# Patient Record
Sex: Female | Born: 1945 | Race: Black or African American | Hispanic: No | Smoking: Never smoker
Health system: Southern US, Community
[De-identification: ages and names within clinical notes are randomized; demographics above are authoritative.]

## PROBLEM LIST (undated history)

## (undated) DIAGNOSIS — E785 Hyperlipidemia, unspecified: Secondary | ICD-10-CM

## (undated) DIAGNOSIS — K5900 Constipation, unspecified: Secondary | ICD-10-CM

## (undated) DIAGNOSIS — E119 Type 2 diabetes mellitus without complications: Secondary | ICD-10-CM

## (undated) DIAGNOSIS — D649 Anemia, unspecified: Secondary | ICD-10-CM

## (undated) DIAGNOSIS — I10 Essential (primary) hypertension: Secondary | ICD-10-CM

## (undated) DIAGNOSIS — G4700 Insomnia, unspecified: Secondary | ICD-10-CM

## (undated) DIAGNOSIS — J45909 Unspecified asthma, uncomplicated: Secondary | ICD-10-CM

## (undated) DIAGNOSIS — Z91018 Allergy to other foods: Secondary | ICD-10-CM

## (undated) DIAGNOSIS — M1990 Unspecified osteoarthritis, unspecified site: Secondary | ICD-10-CM

## (undated) DIAGNOSIS — R0600 Dyspnea, unspecified: Secondary | ICD-10-CM

## (undated) DIAGNOSIS — R600 Localized edema: Secondary | ICD-10-CM

## (undated) DIAGNOSIS — M25569 Pain in unspecified knee: Secondary | ICD-10-CM

## (undated) DIAGNOSIS — J349 Unspecified disorder of nose and nasal sinuses: Secondary | ICD-10-CM

## (undated) DIAGNOSIS — M2550 Pain in unspecified joint: Secondary | ICD-10-CM

## (undated) DIAGNOSIS — E739 Lactose intolerance, unspecified: Secondary | ICD-10-CM

## (undated) HISTORY — DX: Unspecified osteoarthritis, unspecified site: M19.90

## (undated) HISTORY — DX: Constipation, unspecified: K59.00

## (undated) HISTORY — DX: Unspecified disorder of nose and nasal sinuses: J34.9

## (undated) HISTORY — DX: Essential (primary) hypertension: I10

## (undated) HISTORY — DX: Pain in unspecified joint: M25.50

## (undated) HISTORY — DX: Anemia, unspecified: D64.9

## (undated) HISTORY — PX: TUBAL LIGATION: SHX77

## (undated) HISTORY — PX: EYE SURGERY: SHX253

## (undated) HISTORY — DX: Pain in unspecified knee: M25.569

## (undated) HISTORY — DX: Insomnia, unspecified: G47.00

## (undated) HISTORY — DX: Dyspnea, unspecified: R06.00

## (undated) HISTORY — DX: Localized edema: R60.0

## (undated) HISTORY — DX: Unspecified asthma, uncomplicated: J45.909

## (undated) HISTORY — DX: Allergy to other foods: Z91.018

## (undated) HISTORY — DX: Lactose intolerance, unspecified: E73.9

## (undated) HISTORY — DX: Hyperlipidemia, unspecified: E78.5

---

## 2003-06-24 ENCOUNTER — Emergency Department (HOSPITAL_COMMUNITY): Admission: EM | Admit: 2003-06-24 | Discharge: 2003-06-24 | Payer: Self-pay | Admitting: Emergency Medicine

## 2003-09-06 ENCOUNTER — Emergency Department (HOSPITAL_COMMUNITY): Admission: EM | Admit: 2003-09-06 | Discharge: 2003-09-06 | Payer: Self-pay | Admitting: Emergency Medicine

## 2004-09-05 ENCOUNTER — Emergency Department (HOSPITAL_COMMUNITY): Admission: EM | Admit: 2004-09-05 | Discharge: 2004-09-05 | Payer: Self-pay | Admitting: Emergency Medicine

## 2006-12-01 ENCOUNTER — Emergency Department (HOSPITAL_COMMUNITY): Admission: EM | Admit: 2006-12-01 | Discharge: 2006-12-01 | Payer: Self-pay | Admitting: Emergency Medicine

## 2006-12-14 ENCOUNTER — Ambulatory Visit: Payer: Self-pay | Admitting: Family Medicine

## 2006-12-15 ENCOUNTER — Ambulatory Visit: Payer: Self-pay | Admitting: *Deleted

## 2006-12-26 ENCOUNTER — Ambulatory Visit (HOSPITAL_COMMUNITY): Admission: RE | Admit: 2006-12-26 | Discharge: 2006-12-26 | Payer: Self-pay | Admitting: Family Medicine

## 2007-01-02 ENCOUNTER — Ambulatory Visit: Payer: Self-pay | Admitting: Family Medicine

## 2007-01-09 ENCOUNTER — Emergency Department (HOSPITAL_COMMUNITY): Admission: EM | Admit: 2007-01-09 | Discharge: 2007-01-09 | Payer: Self-pay | Admitting: Emergency Medicine

## 2007-01-25 ENCOUNTER — Ambulatory Visit: Payer: Self-pay | Admitting: Family Medicine

## 2007-04-13 ENCOUNTER — Ambulatory Visit: Payer: Self-pay | Admitting: Family Medicine

## 2007-05-15 ENCOUNTER — Emergency Department (HOSPITAL_COMMUNITY): Admission: EM | Admit: 2007-05-15 | Discharge: 2007-05-15 | Payer: Self-pay | Admitting: Emergency Medicine

## 2007-05-26 ENCOUNTER — Ambulatory Visit: Payer: Self-pay | Admitting: Family Medicine

## 2007-09-25 ENCOUNTER — Ambulatory Visit: Payer: Self-pay | Admitting: Family Medicine

## 2007-09-25 LAB — CONVERTED CEMR LAB
CO2: 25 meq/L (ref 19–32)
Calcium: 9.1 mg/dL (ref 8.4–10.5)
Free T4: 1.16 ng/dL (ref 0.89–1.80)
Glucose, Bld: 112 mg/dL — ABNORMAL HIGH (ref 70–99)
Sodium: 140 meq/L (ref 135–145)

## 2007-10-18 ENCOUNTER — Ambulatory Visit: Payer: Self-pay | Admitting: Family Medicine

## 2007-10-18 LAB — CONVERTED CEMR LAB
Calcium: 9.5 mg/dL (ref 8.4–10.5)
Free T4: 1.05 ng/dL (ref 0.89–1.80)
Glucose, Bld: 131 mg/dL — ABNORMAL HIGH (ref 70–99)
TSH: 1.333 microintl units/mL (ref 0.350–5.50)

## 2007-12-25 ENCOUNTER — Ambulatory Visit: Payer: Self-pay | Admitting: Internal Medicine

## 2008-02-24 ENCOUNTER — Emergency Department (HOSPITAL_COMMUNITY): Admission: EM | Admit: 2008-02-24 | Discharge: 2008-02-24 | Payer: Self-pay | Admitting: Emergency Medicine

## 2008-06-21 HISTORY — PX: BREAST BIOPSY: SHX20

## 2008-06-21 HISTORY — PX: BREAST LUMPECTOMY: SHX2

## 2008-12-20 ENCOUNTER — Ambulatory Visit: Payer: Self-pay | Admitting: Family Medicine

## 2008-12-26 ENCOUNTER — Ambulatory Visit (HOSPITAL_COMMUNITY): Admission: RE | Admit: 2008-12-26 | Discharge: 2008-12-26 | Payer: Self-pay | Admitting: Family Medicine

## 2008-12-31 ENCOUNTER — Encounter: Admission: RE | Admit: 2008-12-31 | Discharge: 2008-12-31 | Payer: Self-pay | Admitting: Family Medicine

## 2009-01-24 ENCOUNTER — Encounter: Admission: RE | Admit: 2009-01-24 | Discharge: 2009-01-24 | Payer: Self-pay | Admitting: Infectious Diseases

## 2009-03-12 ENCOUNTER — Ambulatory Visit: Payer: Self-pay | Admitting: Family Medicine

## 2009-03-28 ENCOUNTER — Ambulatory Visit: Payer: Self-pay | Admitting: Family Medicine

## 2009-04-15 ENCOUNTER — Encounter: Admission: RE | Admit: 2009-04-15 | Discharge: 2009-04-15 | Payer: Self-pay | Admitting: General Surgery

## 2009-04-15 ENCOUNTER — Ambulatory Visit (HOSPITAL_COMMUNITY): Admission: RE | Admit: 2009-04-15 | Discharge: 2009-04-15 | Payer: Self-pay | Admitting: General Surgery

## 2009-04-15 ENCOUNTER — Encounter (INDEPENDENT_AMBULATORY_CARE_PROVIDER_SITE_OTHER): Payer: Self-pay | Admitting: General Surgery

## 2010-07-13 ENCOUNTER — Encounter: Payer: Self-pay | Admitting: Family Medicine

## 2010-08-16 ENCOUNTER — Emergency Department (HOSPITAL_COMMUNITY)
Admission: EM | Admit: 2010-08-16 | Discharge: 2010-08-16 | Disposition: A | Discharge status: Home / Self Care | Payer: Self-pay | Attending: Emergency Medicine | Admitting: Emergency Medicine

## 2010-08-16 ENCOUNTER — Emergency Department (HOSPITAL_COMMUNITY): Payer: Self-pay

## 2010-09-24 LAB — DIFFERENTIAL
Basophils Absolute: 0.1 10*3/uL (ref 0.0–0.1)
Basophils Relative: 1 % (ref 0–1)
Eosinophils Absolute: 0.2 10*3/uL (ref 0.0–0.7)
Eosinophils Relative: 2 % (ref 0–5)
Neutrophils Relative %: 67 % (ref 43–77)

## 2010-09-24 LAB — CBC
Hemoglobin: 10.9 g/dL — ABNORMAL LOW (ref 12.0–15.0)
MCHC: 32.7 g/dL (ref 30.0–36.0)
MCV: 80.2 fL (ref 78.0–100.0)
RBC: 4.16 MIL/uL (ref 3.87–5.11)

## 2010-09-24 LAB — BASIC METABOLIC PANEL
CO2: 27 mEq/L (ref 19–32)
Calcium: 9.1 mg/dL (ref 8.4–10.5)
Chloride: 99 mEq/L (ref 96–112)
Potassium: 3.9 mEq/L (ref 3.5–5.1)

## 2011-03-24 LAB — POCT CARDIAC MARKERS: Troponin i, poc: <0.05

## 2011-04-08 LAB — DIFFERENTIAL
Basophils Relative: 0
Monocytes Absolute: 0.7
Monocytes Relative: 6
Neutro Abs: 7.7

## 2011-04-08 LAB — URINALYSIS, ROUTINE W REFLEX MICROSCOPIC
Nitrite: NEGATIVE
Specific Gravity, Urine: 1.022
pH: 6

## 2011-04-08 LAB — CBC
HCT: 32.8 — ABNORMAL LOW
Platelets: 316
RDW: 18.9 — ABNORMAL HIGH

## 2011-04-08 LAB — URINE MICROSCOPIC-ADD ON

## 2011-04-08 LAB — COMPREHENSIVE METABOLIC PANEL
Albumin: 3.8
Alkaline Phosphatase: 78
BUN: 11
Potassium: 4
Sodium: 135
Total Protein: 8.5 — ABNORMAL HIGH

## 2011-04-08 LAB — B-NATRIURETIC PEPTIDE (CONVERTED LAB): Pro B Natriuretic peptide (BNP): <30

## 2012-01-10 ENCOUNTER — Telehealth: Payer: Self-pay

## 2012-01-10 NOTE — Telephone Encounter (Signed)
The patient called to request the copy of her last physical for [facility] mailed to [employer] [facility] [address] [place], [place] [address].  Please clarify request with patient at [phone].

## 2012-01-11 NOTE — Telephone Encounter (Signed)
Last physical mailed as requested. Tried to call patient but phone number wasn't working.

## 2013-06-22 ENCOUNTER — Encounter: Payer: Self-pay | Admitting: Family Medicine

## 2013-06-22 ENCOUNTER — Ambulatory Visit (INDEPENDENT_AMBULATORY_CARE_PROVIDER_SITE_OTHER): Payer: Medicare Other | Admitting: Family Medicine

## 2013-06-22 VITALS — BP 158/80 | HR 81 | Temp 98.5°F | Resp 18 | Ht 61.5 in | Wt 209.0 lb

## 2013-06-22 DIAGNOSIS — D649 Anemia, unspecified: Secondary | ICD-10-CM

## 2013-06-22 DIAGNOSIS — I10 Essential (primary) hypertension: Secondary | ICD-10-CM

## 2013-06-22 LAB — POCT CBC
Granulocyte percent: 69 %G (ref 37–80)
HCT, POC: 37.7 % (ref 37.7–47.9)
HEMOGLOBIN: 11.3 g/dL — AB (ref 12.2–16.2)
LYMPH, POC: 3.1 (ref 0.6–3.4)
MCH: 24.4 pg — AB (ref 27–31.2)
MCHC: 30 g/dL — AB (ref 31.8–35.4)
MCV: 81.5 fL (ref 80–97)
MID (cbc): 0.6 (ref 0–0.9)
MPV: 9.6 fL (ref 0–99.8)
POC Granulocyte: 8.3 — AB (ref 2–6.9)
POC LYMPH PERCENT: 25.9 %L (ref 10–50)
POC MID %: 5.1 %M (ref 0–12)
Platelet Count, POC: 356 10*3/uL (ref 142–424)
RBC: 4.63 M/uL (ref 4.04–5.48)
RDW, POC: 19.7 %
WBC: 12 10*3/uL — AB (ref 4.6–10.2)

## 2013-06-22 LAB — BASIC METABOLIC PANEL
BUN: 12 mg/dL (ref 6–23)
CALCIUM: 9.5 mg/dL (ref 8.4–10.5)
CHLORIDE: 100 meq/L (ref 96–112)
CO2: 29 meq/L (ref 19–32)
CREATININE: 0.88 mg/dL (ref 0.50–1.10)
GLUCOSE: 111 mg/dL — AB (ref 70–99)
Potassium: 4 mEq/L (ref 3.5–5.3)
Sodium: 140 mEq/L (ref 135–145)

## 2013-06-22 MED ORDER — LISINOPRIL-HYDROCHLOROTHIAZIDE 10-12.5 MG PO TABS: 1.0000 | ORAL_TABLET | Freq: Every day | ORAL | Status: DC

## 2013-06-22 NOTE — Progress Notes (Addendum)
Subjective:    Patient ID: Penny Gibson, female    DOB: [birth year], 68 y.o.   MRN: [id number] This chart was scribed for Merri Ray, MD by Vernell Barrier, Medical Scribe. This patient's care was started at 3:10 PM.  HPI HPI Comments: Penny Gibson is a 68 y.o. female here for grandparent physical but also noted hx of anemia, dyspnea on exertion, hx of HTN, and has been out of medicine since yesterday. No PCP. No refills left. Prior lived in [place] where she was diagnosed with anemia and put on iron supplement 1x/day. She had dyspnea on exertion only, not at rest, and only when walking long distances. Denies chest pain, lightheadedness or other new sx recently.   There are no active problems to display for this patient.  Past Medical History  Diagnosis Date  . Anemia   . Hypertension   . Arthritis    Past Surgical History  Procedure Laterality Date  . Breast lumpectomy    . Tubal ligation     Allergies  Allergen Reactions  . Penicillins    Prior to Admission medications   Medication Sig Start Date End Date Taking? Authorizing Provider  lisinopril-hydrochlorothiazide (PRINZIDE,ZESTORETIC) 10-12.5 MG per tablet Take 1 tablet by mouth daily.   Yes Historical Provider, MD   History   Social History  . Marital Status: Divorced    Spouse Name: N/A    Number of Children: N/A  . Years of Education: N/A   Occupational History  . Not on file.   Social History Main Topics  . Smoking status: Not on file  . Smokeless tobacco: Not on file  . Alcohol Use: Not on file  . Drug Use: Not on file  . Sexual Activity: Not on file   Other Topics Concern  . Not on file   Social History Narrative  . No narrative on file    Review of Systems  Respiratory: Positive for shortness of breath (on exertion).   Cardiovascular: Negative for chest pain.  Neurological: Negative for light-headedness.       Objective:   Physical Exam  Vitals reviewed. Constitutional: She  is oriented to person, place, and time. She appears well-developed and well-nourished.  HENT:  Head: Normocephalic and atraumatic.  Eyes: Conjunctivae and EOM are normal. Pupils are equal, round, and reactive to light.  Neck: Carotid bruit is not present.  Cardiovascular: Normal rate, regular rhythm, normal heart sounds and intact distal pulses.   Pulmonary/Chest: Effort normal and breath sounds normal.  Abdominal: Soft. She exhibits no pulsatile midline mass. There is no tenderness.  Neurological: She is alert and oriented to person, place, and time.  Skin: Skin is warm and dry.  Psychiatric: She has a normal mood and affect. Her behavior is normal.   Results for orders placed in visit on 06/22/13  POCT CBC      Result Value Range   WBC 12.0 (*) 4.6 - 10.2 K/uL   Lymph, poc 3.1  0.6 - 3.4   POC LYMPH PERCENT 25.9  10 - 50 %L   MID (cbc) 0.6  0 - 0.9   POC MID % 5.1  0 - 12 %M   POC Granulocyte 8.3 (*) 2 - 6.9   Granulocyte percent 69.0  37 - 80 %G   RBC 4.63  4.04 - 5.48 M/uL   Hemoglobin 11.3 (*) 12.2 - 16.2 g/dL   HCT, POC 37.7  37.7 - 47.9 %   MCV 81.5  80 -  97 fL   MCH, POC 24.4 (*) 27 - 31.2 pg   MCHC 30.0 (*) 31.8 - 35.4 g/dL   RDW, POC 19.7     Platelet Count, POC 356  142 - 424 K/uL   MPV 9.6  0 - 99.8 fL     Filed Vitals:   06/22/13 1509  BP: 158/80  Pulse: 81  Temp: 98.5 F (36.9 C)  TempSrc: Oral  Resp: 18  Height: 5' 1.5" (1.562 m)  Weight: 209 lb (94.802 kg)  SpO2: 94%       Assessment & Plan:   Penny Gibson is a 68 y.o. female Hypertension - Plan: Basic metabolic panel, lisinopril-hydrochlorothiazide (PRINZIDE,ZESTORETIC) 10-12.5 MG per tablet - refilled prior medication for 3 months as controlled when on this prior. BMP pending. Discussed obtaining new PCP in area, number given for [facility].  Anemia - Plan: POCT CBC - borderline HGB - cont iron qd follow up with PCP.   DOE - no new symptoms, no known lung dz. Can follow up with  new PCP.   Meds ordered this encounter           . lisinopril-hydrochlorothiazide (PRINZIDE,ZESTORETIC) 10-12.5 MG per tablet    Sig: Take 1 tablet by mouth daily.    Dispense:  30 tablet    Refill:  2   Patient Instructions  Primary care: [facility]: [phone] Let us know if you need other numbers. We are happy to see you for any acute illness if needed.  You should receive a call or letter about your lab results within the next week to 10 days.  I refilled your blood pressure medicine today, but you will need to follow up with primary care doctor in next 2-3 months. Sooner or return here if any increasing shortness of breath, or other worsening symptoms.  Recheck in 81 to 72 hours for your tb skin test.     I personally performed the services described in this documentation, which was scribed in my presence. The recorded information has been reviewed and considered, and addended by me as needed.

## 2013-06-22 NOTE — Patient Instructions (Addendum)
Primary care: [facility]: [phone] Let us know if you need other numbers. We are happy to see you for any acute illness if needed.  You should receive a call or letter about your lab results within the next week to 10 days.  I refilled your blood pressure medicine today, but you will need to follow up with primary care doctor in next 2-3 months. Sooner or return here if any increasing shortness of breath, or other worsening symptoms.  Recheck in 48 to 72 hours for your tb skin test.

## 2013-06-28 ENCOUNTER — Encounter: Payer: Self-pay | Admitting: Family Medicine

## 2013-07-27 ENCOUNTER — Encounter: Payer: Self-pay | Admitting: *Deleted

## 2013-08-02 ENCOUNTER — Ambulatory Visit: Payer: Medicare Other | Admitting: Nurse Practitioner

## 2013-09-06 ENCOUNTER — Ambulatory Visit: Payer: Medicare Other | Admitting: Nurse Practitioner

## 2013-09-19 ENCOUNTER — Encounter (HOSPITAL_COMMUNITY): Payer: Self-pay | Admitting: Emergency Medicine

## 2013-09-19 ENCOUNTER — Inpatient Hospital Stay (HOSPITAL_COMMUNITY)
Admission: EM | Admit: 2013-09-19 | Discharge: 2013-09-20 | DRG: 812 | Disposition: A | Discharge status: Home / Self Care | Payer: Medicare Other | Attending: Family Medicine | Admitting: Family Medicine

## 2013-09-19 DIAGNOSIS — R42 Dizziness and giddiness: Secondary | ICD-10-CM | POA: Diagnosis present

## 2013-09-19 DIAGNOSIS — Z880 Allergy status to penicillin: Secondary | ICD-10-CM

## 2013-09-19 DIAGNOSIS — M159 Polyosteoarthritis, unspecified: Secondary | ICD-10-CM | POA: Diagnosis present

## 2013-09-19 DIAGNOSIS — M129 Arthropathy, unspecified: Secondary | ICD-10-CM | POA: Diagnosis present

## 2013-09-19 DIAGNOSIS — E669 Obesity, unspecified: Secondary | ICD-10-CM | POA: Diagnosis present

## 2013-09-19 DIAGNOSIS — Z6841 Body Mass Index (BMI) 40.0 and over, adult: Secondary | ICD-10-CM

## 2013-09-19 DIAGNOSIS — D72829 Elevated white blood cell count, unspecified: Secondary | ICD-10-CM | POA: Diagnosis present

## 2013-09-19 DIAGNOSIS — I951 Orthostatic hypotension: Secondary | ICD-10-CM | POA: Diagnosis present

## 2013-09-19 DIAGNOSIS — D649 Anemia, unspecified: Secondary | ICD-10-CM

## 2013-09-19 DIAGNOSIS — M1990 Unspecified osteoarthritis, unspecified site: Secondary | ICD-10-CM

## 2013-09-19 DIAGNOSIS — I10 Essential (primary) hypertension: Secondary | ICD-10-CM | POA: Diagnosis present

## 2013-09-19 DIAGNOSIS — R0602 Shortness of breath: Secondary | ICD-10-CM | POA: Diagnosis present

## 2013-09-19 DIAGNOSIS — Z79899 Other long term (current) drug therapy: Secondary | ICD-10-CM

## 2013-09-19 LAB — BASIC METABOLIC PANEL
BUN: 12 mg/dL (ref 6–23)
CALCIUM: 9.3 mg/dL (ref 8.4–10.5)
CO2: 25 meq/L (ref 19–32)
CREATININE: 0.58 mg/dL (ref 0.50–1.10)
Chloride: 99 mEq/L (ref 96–112)
GFR calc Af Amer: >90 mL/min (ref >90)
Glucose, Bld: 103 mg/dL — ABNORMAL HIGH (ref 70–99)
Potassium: 3.9 mEq/L (ref 3.7–5.3)
Sodium: 138 mEq/L (ref 137–147)

## 2013-09-19 LAB — CBC WITH DIFFERENTIAL/PLATELET
BASOS ABS: 0 10*3/uL (ref 0.0–0.1)
Basophils Relative: 0 % (ref 0–1)
EOS ABS: 0.3 10*3/uL (ref 0.0–0.7)
EOS PCT: 2 % (ref 0–5)
HEMATOCRIT: 16.1 % — AB (ref 36.0–46.0)
Hemoglobin: 5.1 g/dL — CL (ref 12.0–15.0)
LYMPHS PCT: 35 % (ref 12–46)
Lymphs Abs: 5.2 10*3/uL — ABNORMAL HIGH (ref 0.7–4.0)
MCH: 26.6 pg (ref 26.0–34.0)
MCHC: 31.7 g/dL (ref 30.0–36.0)
MCV: 83.9 fL (ref 78.0–100.0)
MONO ABS: 0.9 10*3/uL (ref 0.1–1.0)
Monocytes Relative: 6 % (ref 3–12)
Neutro Abs: 8.6 10*3/uL — ABNORMAL HIGH (ref 1.7–7.7)
Neutrophils Relative %: 57 % (ref 43–77)
Platelets: 424 10*3/uL — ABNORMAL HIGH (ref 150–400)
RBC: 1.92 MIL/uL — ABNORMAL LOW (ref 3.87–5.11)
RDW: 18.6 % — AB (ref 11.5–15.5)
WBC: 15.2 10*3/uL — AB (ref 4.0–10.5)

## 2013-09-19 LAB — HEMOGLOBIN AND HEMATOCRIT, BLOOD
HCT: 36.6 % (ref 36.0–46.0)
HCT: 35.1 % — ABNORMAL LOW (ref 36.0–46.0)
Hemoglobin: 11.9 g/dL — ABNORMAL LOW (ref 12.0–15.0)
Hemoglobin: 11.6 g/dL — ABNORMAL LOW (ref 12.0–15.0)

## 2013-09-19 LAB — I-STAT TROPONIN, ED: Troponin i, poc: 0 ng/mL (ref 0.00–0.08)

## 2013-09-19 LAB — RETICULOCYTES
RBC.: 4.46 MIL/uL (ref 3.87–5.11)
RETIC CT PCT: 1.1 % (ref 0.4–3.1)
Retic Count, Absolute: 49.1 10*3/uL (ref 19.0–186.0)

## 2013-09-19 LAB — POC OCCULT BLOOD, ED: FECAL OCCULT BLD: NEGATIVE

## 2013-09-19 LAB — PREPARE RBC (CROSSMATCH)

## 2013-09-19 LAB — ABO/RH: ABO/RH(D): B POS

## 2013-09-19 MED ORDER — PANTOPRAZOLE SODIUM 40 MG IV SOLR
40.0000 mg | Freq: Once | INTRAVENOUS | Status: AC
  Administered 2013-09-19: 40 mg via INTRAVENOUS
  Filled 2013-09-19: qty 40

## 2013-09-19 MED ORDER — OXYCODONE HCL 5 MG PO TABS: 5.0000 mg | ORAL_TABLET | ORAL | Status: DC | PRN

## 2013-09-19 MED ORDER — SODIUM CHLORIDE 0.9 % IV SOLN
INTRAVENOUS | Status: DC
Start: 2013-09-19 — End: 2013-09-20
  Administered 2013-09-20: 01:00:00 via INTRAVENOUS

## 2013-09-19 MED ORDER — ACETAMINOPHEN 325 MG PO TABS: 650.0000 mg | ORAL_TABLET | Freq: Four times a day (QID) | ORAL | Status: DC | PRN

## 2013-09-19 MED ORDER — LISINOPRIL-HYDROCHLOROTHIAZIDE 10-12.5 MG PO TABS: 1.0000 | ORAL_TABLET | Freq: Every day | ORAL | Status: DC

## 2013-09-19 MED ORDER — HYDROCHLOROTHIAZIDE 12.5 MG PO CAPS
12.5000 mg | ORAL_CAPSULE | Freq: Every day | ORAL | Status: DC
  Administered 2013-09-19 – 2013-09-20 (×2): 12.5 mg via ORAL
  Filled 2013-09-19 (×2): qty 1

## 2013-09-19 MED ORDER — HYDROMORPHONE HCL PF 1 MG/ML IJ SOLN: 0.5000 mg | INTRAMUSCULAR | Status: DC | PRN

## 2013-09-19 MED ORDER — PANTOPRAZOLE SODIUM 40 MG IV SOLR
40.0000 mg | Freq: Two times a day (BID) | INTRAVENOUS | Status: DC
  Administered 2013-09-19 (×2): 40 mg via INTRAVENOUS
  Filled 2013-09-19 (×4): qty 40

## 2013-09-19 MED ORDER — ALUM & MAG HYDROXIDE-SIMETH 200-200-20 MG/5ML PO SUSP: 30.0000 mL | Freq: Four times a day (QID) | ORAL | Status: DC | PRN

## 2013-09-19 MED ORDER — GI COCKTAIL ~~LOC~~
30.0000 mL | Freq: Once | ORAL | Status: AC
  Administered 2013-09-19: 30 mL via ORAL
  Filled 2013-09-19: qty 30

## 2013-09-19 MED ORDER — INFLUENZA VAC SPLIT QUAD 0.5 ML IM SUSP
0.5000 mL | INTRAMUSCULAR | Status: DC
  Filled 2013-09-19 (×2): qty 0.5

## 2013-09-19 MED ORDER — SODIUM CHLORIDE 0.9 % IV BOLUS (SEPSIS)
500.0000 mL | Freq: Once | INTRAVENOUS | Status: AC
  Administered 2013-09-19: 500 mL via INTRAVENOUS

## 2013-09-19 MED ORDER — ASPIRIN 81 MG PO CHEW
324.0000 mg | CHEWABLE_TABLET | Freq: Once | ORAL | Status: AC
  Administered 2013-09-19: 324 mg via ORAL
  Filled 2013-09-19: qty 4

## 2013-09-19 MED ORDER — LISINOPRIL 10 MG PO TABS
10.0000 mg | ORAL_TABLET | Freq: Every day | ORAL | Status: DC
  Administered 2013-09-19 – 2013-09-20 (×2): 10 mg via ORAL
  Filled 2013-09-19 (×2): qty 1

## 2013-09-19 MED ORDER — PNEUMOCOCCAL VAC POLYVALENT 25 MCG/0.5ML IJ INJ
0.5000 mL | INJECTION | INTRAMUSCULAR | Status: AC
  Administered 2013-09-20: 0.5 mL via INTRAMUSCULAR
  Filled 2013-09-19 (×2): qty 0.5

## 2013-09-19 MED ORDER — ONDANSETRON HCL 4 MG PO TABS: 4.0000 mg | ORAL_TABLET | Freq: Four times a day (QID) | ORAL | Status: DC | PRN

## 2013-09-19 MED ORDER — ONDANSETRON HCL 4 MG/2ML IJ SOLN
4.0000 mg | Freq: Once | INTRAMUSCULAR | Status: AC
  Administered 2013-09-19: 4 mg via INTRAVENOUS
  Filled 2013-09-19: qty 2

## 2013-09-19 MED ORDER — ONDANSETRON HCL 4 MG/2ML IJ SOLN: 4.0000 mg | Freq: Four times a day (QID) | INTRAMUSCULAR | Status: DC | PRN

## 2013-09-19 MED ORDER — ACETAMINOPHEN 650 MG RE SUPP: 650.0000 mg | Freq: Four times a day (QID) | RECTAL | Status: DC | PRN
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
## 2013-09-19 NOTE — ED Notes (Signed)
Multiple attempts made by staff to obtain blood sample for a type and screen.  No blood has been obtained at this time.
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
## 2013-09-19 NOTE — Care Management Note (Addendum)
    Page 1 of 1   09/20/2013     12:31:22 PM   CARE MANAGEMENT NOTE 09/20/2013  Patient:  Penny Gibson, Penny Gibson   Account Number:  [id number]  Date Initiated:  09/19/2013  Documentation initiated by:  Dessa Phi  Subjective/Objective Assessment:   68 Y/O F ADMITTED W/ANEMIA.     Action/Plan:   FROM HOME.HAS PCP,PHARMACY.   Anticipated DC Date:  09/20/2013   Anticipated DC Plan:  [place]  CM consult      Choice offered to / List presented to:             Status of service:  Completed, signed off Medicare Important Message given?   (If response is "NO", the following Medicare IM given date fields will be blank) Date Medicare IM given:   Date Additional Medicare IM given:    Discharge Disposition:  HOME/SELF CARE  Per UR Regulation:  Reviewed for med. necessity/level of care/duration of stay  If discussed at [place] of Stay Meetings, dates discussed:    Comments:  09/20/13 [facility] RN,BSN NCM [phone] PROVIDED PATIENT W/PRIVATE SITTER LIST AS RESOURCE.NO NEEDS OR ORDERS.  09/19/13 Kawena Lyday RN,BSN NCM [phone] NO ANTICIPATED D/C NEEDS.

## 2013-09-19 NOTE — Progress Notes (Signed)
Patient seen and evaluated earlier this AM.  Please refer to admitting physician's H and P.    Will reassess next am.  Velvet Bathe

## 2013-09-19 NOTE — Progress Notes (Signed)
Report from [place], [place]. Pt sitting up in bed, eating full liquid diet. Denies abd pain/nausea.  Reports some lightheadedness when ambulating, pt instructed to call for assistance w/ OOB and verbalizes understanding. Callbell and bedside table in reach. Bed alarm on.

## 2013-09-20 ENCOUNTER — Encounter: Payer: Self-pay | Admitting: Internal Medicine

## 2013-09-20 LAB — TYPE AND SCREEN
ABO/RH(D): B POS
Antibody Screen: NEGATIVE
UNIT DIVISION: 0
UNIT DIVISION: 0

## 2013-09-20 LAB — CBC
HEMATOCRIT: 36 % (ref 36.0–46.0)
Hemoglobin: 11.3 g/dL — ABNORMAL LOW (ref 12.0–15.0)
MCH: 25.7 pg — ABNORMAL LOW (ref 26.0–34.0)
MCHC: 31.4 g/dL (ref 30.0–36.0)
MCV: 82 fL (ref 78.0–100.0)
Platelets: 265 10*3/uL (ref 150–400)
RBC: 4.39 MIL/uL (ref 3.87–5.11)
RDW: 19.3 % — AB (ref 11.5–15.5)
WBC: 9.6 10*3/uL (ref 4.0–10.5)

## 2013-09-20 LAB — HEMOGLOBIN AND HEMATOCRIT, BLOOD
HCT: 35.5 % — ABNORMAL LOW (ref 36.0–46.0)
Hemoglobin: 11.6 g/dL — ABNORMAL LOW (ref 12.0–15.0)

## 2013-09-20 LAB — BASIC METABOLIC PANEL
BUN: 8 mg/dL (ref 6–23)
CHLORIDE: 104 meq/L (ref 96–112)
CO2: 25 meq/L (ref 19–32)
CREATININE: 0.61 mg/dL (ref 0.50–1.10)
Calcium: 8.7 mg/dL (ref 8.4–10.5)
GFR calc Af Amer: >90 mL/min (ref >90)
GFR calc non Af Amer: >90 mL/min (ref >90)
Glucose, Bld: 100 mg/dL — ABNORMAL HIGH (ref 70–99)
Potassium: 3.9 mEq/L (ref 3.7–5.3)
Sodium: 140 mEq/L (ref 137–147)

## 2013-09-20 LAB — IRON AND TIBC
IRON: 177 ug/dL — AB (ref 42–135)
SATURATION RATIOS: 53 % (ref 20–55)
TIBC: 335 ug/dL (ref 250–470)
UIBC: 158 ug/dL (ref 125–400)

## 2013-09-20 LAB — FOLATE: Folate: >20 ng/mL

## 2013-09-20 LAB — FERRITIN: Ferritin: 17 ng/mL (ref 10–291)

## 2013-09-20 LAB — VITAMIN B12: VITAMIN B 12: 485 pg/mL (ref 211–911)

## 2013-09-20 MED ORDER — PANTOPRAZOLE SODIUM 40 MG PO TBEC
40.0000 mg | DELAYED_RELEASE_TABLET | Freq: Two times a day (BID) | ORAL | Status: DC
  Administered 2013-09-20: 40 mg via ORAL
  Filled 2013-09-20: qty 1

## 2013-09-20 NOTE — ED Provider Notes (Signed)
Medical screening examination/treatment/procedure(s) were conducted as a shared visit with non-physician practitioner(s) and myself.  I personally evaluated the patient during the encounter.   EKG Interpretation   Date/Time:  Wednesday September 19 2013 00:59:58 EDT Ventricular Rate:  66 PR Interval:  175 QRS Duration: 94 QT Interval:  402 QTC Calculation: 421 R Axis:   -35 Text Interpretation:  Sinus rhythm Left axis deviation Similar to prior  Confirmed by Dina Rich  MD, COURTNEY ([id number]) on 09/20/2013 10:09:38 AM        Merryl Hacker, MD 09/20/13 1009

## 2013-09-20 NOTE — Progress Notes (Signed)
D/c instructions reviewed w/ pt. She verbalizes understanding and all questions answered. F/u appt for colonoscopy made w/ [facility] and pt aware. Pt d/c in w/c in stable condition by NT to friend's car. Pt in possession of d/c instructions and all personal belongings.

## 2013-09-20 NOTE — Progress Notes (Addendum)
Spoke with the patient at length concerning current living environment. Patient states she feels unsafe at her current living situation and does not want to return home. Pt wants to discuss with Social Worker Independent Living Arrangements, she states she would benefit in a safer environment and more controlled environment with independence. SRP, RN , BSN

## 2013-09-20 NOTE — Discharge Instructions (Signed)
Anemia, Nonspecific Anemia is a condition in which the concentration of red blood cells or hemoglobin in the blood is below normal. Hemoglobin is a substance in red blood cells that carries oxygen to the tissues of the body. Anemia results in not enough oxygen reaching these tissues.  CAUSES  Common causes of anemia include:   Excessive bleeding. Bleeding may be internal or external. This includes excessive bleeding from periods (in women) or from the intestine.   Poor nutrition.   Chronic kidney, thyroid, and liver disease.  Bone marrow disorders that decrease red blood cell production.  Cancer and treatments for cancer.  HIV, AIDS, and their treatments.  Spleen problems that increase red blood cell destruction.  Blood disorders.  Excess destruction of red blood cells due to infection, medicines, and autoimmune disorders. SIGNS AND SYMPTOMS   Minor weakness.   Dizziness.   Headache.  Palpitations.   Shortness of breath, especially with exercise.   Paleness.  Cold sensitivity.  Indigestion.  Nausea.  Difficulty sleeping.  Difficulty concentrating. Symptoms may occur suddenly or they may develop slowly.  DIAGNOSIS  Additional blood tests are often needed. These help your health care provider determine the best treatment. Your health care provider will check your stool for blood and look for other causes of blood loss.  TREATMENT  Treatment varies depending on the cause of the anemia. Treatment can include:   Supplements of iron, vitamin Z61, or folic acid.   Hormone medicines.   A blood transfusion. This may be needed if blood loss is severe.   Hospitalization. This may be needed if there is significant continual blood loss.   Dietary changes.  Spleen removal. HOME CARE INSTRUCTIONS Keep all follow-up appointments. It often takes many weeks to correct anemia, and having your health care provider check on your condition and your response to  treatment is very important. SEEK IMMEDIATE MEDICAL CARE IF:   You develop extreme weakness, shortness of breath, or chest pain.   You become dizzy or have trouble concentrating.  You develop heavy vaginal bleeding.   You develop a rash.   You have bloody or black, tarry stools.   You faint.   You vomit up blood.   You vomit repeatedly.   You have abdominal pain.  You have a fever or persistent symptoms for more than 2 3 days.   You have a fever and your symptoms suddenly get worse.   You are dehydrated.  MAKE SURE YOU:  Understand these instructions.  Will watch your condition.  Will get help right away if you are not doing well or get worse. Document Released: 07/15/2004 Document Revised: 02/07/2013 Document Reviewed: 12/01/2012 [facility] Patient Information 2014 [place].  Anemia, Nonspecific Anemia is a condition in which the concentration of red blood cells or hemoglobin in the blood is below normal. Hemoglobin is a substance in red blood cells that carries oxygen to the tissues of the body. Anemia results in not enough oxygen reaching these tissues.  CAUSES  Common causes of anemia include:   Excessive bleeding. Bleeding may be internal or external. This includes excessive bleeding from periods (in women) or from the intestine.   Poor nutrition.   Chronic kidney, thyroid, and liver disease.  Bone marrow disorders that decrease red blood cell production.  Cancer and treatments for cancer.  HIV, AIDS, and their treatments.  Spleen problems that increase red blood cell destruction.  Blood disorders.  Excess destruction of red blood cells due to infection, medicines, and  autoimmune disorders. SIGNS AND SYMPTOMS   Minor weakness.   Dizziness.   Headache.  Palpitations.   Shortness of breath, especially with exercise.   Paleness.  Cold sensitivity.  Indigestion.  Nausea.  Difficulty sleeping.  Difficulty  concentrating. Symptoms may occur suddenly or they may develop slowly.  DIAGNOSIS  Additional blood tests are often needed. These help your health care provider determine the best treatment. Your health care provider will check your stool for blood and look for other causes of blood loss.  TREATMENT  Treatment varies depending on the cause of the anemia. Treatment can include:   Supplements of iron, vitamin G86, or folic acid.   Hormone medicines.   A blood transfusion. This may be needed if blood loss is severe.   Hospitalization. This may be needed if there is significant continual blood loss.   Dietary changes.  Spleen removal. HOME CARE INSTRUCTIONS Keep all follow-up appointments. It often takes many weeks to correct anemia, and having your health care provider check on your condition and your response to treatment is very important. SEEK IMMEDIATE MEDICAL CARE IF:   You develop extreme weakness, shortness of breath, or chest pain.   You become dizzy or have trouble concentrating.  You develop heavy vaginal bleeding.   You develop a rash.   You have bloody or black, tarry stools.   You faint.   You vomit up blood.   You vomit repeatedly.   You have abdominal pain.  You have a fever or persistent symptoms for more than 2 3 days.   You have a fever and your symptoms suddenly get worse.   You are dehydrated.  MAKE SURE YOU:  Understand these instructions.  Will watch your condition.  Will get help right away if you are not doing well or get worse. Document Released: 07/15/2004 Document Revised: 02/07/2013 Document Reviewed: 12/01/2012 [employer] Patient Information 2014 [place].
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
## 2013-09-20 NOTE — Progress Notes (Signed)
The patient is receiving Protonix by the intravenous route.  Based on criteria approved by the Pharmacy and [facility], the medication is being converted to the equivalent oral dose form.  These criteria include: -No Active GI bleeding -Able to tolerate diet of full liquids (or better) or tube feeding -Able to tolerate other medications by the oral or enteral route  If you have any questions about this conversion, please contact the Pharmacy Department (phone [phone]).  Thank you.  The pharmacy has been asked to automatically convert prontix from IV to PO in all patients who can safely take an oral medication, this is secondary to Critical Shortage of Protonix IV  Penny Gibson, Penny Gibson PharmD Pager #: [phone] 7:33 AM 09/20/2013

## 2013-09-20 NOTE — Clinical Documentation Improvement (Signed)
09/20/13  Possible Clinical Conditions?    Expected Acute Blood Loss Anemia  Acute Blood Loss Anemia  Acute on chronic blood loss anemia  Other Condition________________  Cannot Clinically Determine    Clinical Indicators:   Per   H&P = Anemia- Hb =5.1 with Normocytic Indices, HEME Negative on FOBT, Send Anemia Panel, and FOBT q day X 3, IV Protonix, and Hold NSAIDs. Transfuse 2 units of PRBCs. Monitor H/Hs. GI consult as Inpatient or Outpatient.     Thank You, Serena Colonel ,RN Clinical Documentation Specialist:  [facility]

## 2013-10-01 ENCOUNTER — Emergency Department (HOSPITAL_COMMUNITY)
Admission: EM | Admit: 2013-10-01 | Discharge: 2013-10-01 | Disposition: A | Discharge status: Home / Self Care | Payer: Medicare Other | Attending: Emergency Medicine | Admitting: Emergency Medicine

## 2013-10-01 ENCOUNTER — Encounter (HOSPITAL_COMMUNITY): Payer: Self-pay | Admitting: Emergency Medicine

## 2013-10-01 DIAGNOSIS — R079 Chest pain, unspecified: Secondary | ICD-10-CM | POA: Insufficient documentation

## 2013-10-01 DIAGNOSIS — Z862 Personal history of diseases of the blood and blood-forming organs and certain disorders involving the immune mechanism: Secondary | ICD-10-CM | POA: Insufficient documentation

## 2013-10-01 DIAGNOSIS — H579 Unspecified disorder of eye and adnexa: Secondary | ICD-10-CM | POA: Insufficient documentation

## 2013-10-01 DIAGNOSIS — Z880 Allergy status to penicillin: Secondary | ICD-10-CM | POA: Insufficient documentation

## 2013-10-01 DIAGNOSIS — I10 Essential (primary) hypertension: Secondary | ICD-10-CM | POA: Insufficient documentation

## 2013-10-01 DIAGNOSIS — H5789 Other specified disorders of eye and adnexa: Secondary | ICD-10-CM

## 2013-10-01 DIAGNOSIS — R0789 Other chest pain: Secondary | ICD-10-CM

## 2013-10-01 DIAGNOSIS — Z8739 Personal history of other diseases of the musculoskeletal system and connective tissue: Secondary | ICD-10-CM | POA: Insufficient documentation

## 2013-10-01 DIAGNOSIS — Z79899 Other long term (current) drug therapy: Secondary | ICD-10-CM | POA: Insufficient documentation

## 2013-10-01 MED ORDER — GI COCKTAIL ~~LOC~~
30.0000 mL | Freq: Once | ORAL | Status: AC
  Administered 2013-10-01: 30 mL via ORAL
  Filled 2013-10-01: qty 30

## 2013-10-01 NOTE — Discharge Instructions (Signed)
Allergies ° Allergies may happen from anything your body is sensitive to. This may be food, medicines, pollens, chemicals, and many other things. Food allergies can be severe and deadly.  °HOME CARE °· If you do not know what causes a reaction, keep a diary. Write down the foods you ate and the symptoms that followed. Avoid foods that cause reactions. °· If you have red raised spots (hives) or a rash: °· Take medicine as told by your doctor. °· Use medicines for red raised spots and itching as needed. °· Apply cold cloths (compresses) to the skin. Take a cool bath. Avoid hot baths or showers. °· If you are severely allergic: °· It is often necessary to go to the hospital after you have treated your reaction. °· Wear your medical alert jewelry. °· You and your family must learn how to give a allergy shot or use an allergy kit (anaphylaxis kit). °· Always carry your allergy kit or shot with you. Use this medicine as told by your doctor if a severe reaction is occurring. °GET HELP RIGHT AWAY IF: °· You have trouble breathing or are making high-pitched whistling sounds (wheezing). °· You have a tight feeling in your chest or throat. °· You have a puffy (swollen) mouth. °· You have red raised spots, puffiness (swelling), or itching all over your body. °· You have had a severe reaction that was helped by your allergy kit or shot. The reaction can return once the medicine has worn off. °· You think you are having a food allergy. Symptoms most often happen within 30 minutes of eating a food. °· Your symptoms have not gone away within 2 days or are getting worse. °· You have new symptoms. °· You want to retest yourself with a food or drink you think causes an allergic reaction. Only do this under the care of a doctor. °MAKE SURE YOU:  °· Understand these instructions. °· Will watch your condition. °· Will get help right away if you are not doing well or get worse. °Document Released: 10/02/2012 Document Reviewed:  10/02/2012 °[employer]® Patient Information ©2014 [employer]. ° °
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
## 2013-10-01 NOTE — ED Notes (Signed)
Pt able to speak in complete sentences.

## 2013-10-07 NOTE — ED Provider Notes (Signed)
Medical screening examination/treatment/procedure(s) were performed by non-physician practitioner and as supervising physician I was immediately available for consultation/collaboration.   EKG Interpretation None        Meiah Zamudio M Kimari Coudriet, MD 10/07/13 1413 

## 2013-10-11 ENCOUNTER — Ambulatory Visit: Payer: Medicare Other | Admitting: Nurse Practitioner

## 2013-11-13 ENCOUNTER — Encounter: Payer: Medicare Other | Admitting: Internal Medicine

## 2013-11-15 ENCOUNTER — Encounter: Payer: Self-pay | Admitting: Nurse Practitioner

## 2013-11-15 ENCOUNTER — Ambulatory Visit (INDEPENDENT_AMBULATORY_CARE_PROVIDER_SITE_OTHER): Payer: Medicare Other | Admitting: Nurse Practitioner

## 2013-11-15 VITALS — BP 130/90 | HR 77 | Temp 98.3°F | Ht 61.0 in | Wt 213.0 lb

## 2013-11-15 DIAGNOSIS — M129 Arthropathy, unspecified: Secondary | ICD-10-CM

## 2013-11-15 DIAGNOSIS — I10 Essential (primary) hypertension: Secondary | ICD-10-CM

## 2013-11-15 DIAGNOSIS — D649 Anemia, unspecified: Secondary | ICD-10-CM

## 2013-11-15 DIAGNOSIS — Z6841 Body Mass Index (BMI) 40.0 and over, adult: Secondary | ICD-10-CM | POA: Insufficient documentation

## 2013-11-15 DIAGNOSIS — M1990 Unspecified osteoarthritis, unspecified site: Secondary | ICD-10-CM

## 2013-11-15 MED ORDER — LISINOPRIL-HYDROCHLOROTHIAZIDE 10-12.5 MG PO TABS: 1.0000 | ORAL_TABLET | Freq: Every day | ORAL | Status: DC

## 2013-11-15 NOTE — Progress Notes (Signed)
Patient ID: Penny Gibson, female   DOB: October 01, 1945, 68 y.o.   MRN: [id number]    Allergies  Allergen Reactions  . Penicillins Anaphylaxis    Chief Complaint  Patient presents with  . Medical Management of Chronic Issues    New Patient. Doesn't have a doctor, usually goes to Urgent Care or ER. Use to go to [employer].    HPI: Patient is a 67 y.o. female seen in the office today to establish care [facility] but has not had routine care since [place] to hospital last month due to shortness of breath and she was found to be anemic and required transfusion, SOB has been better since that time Mostly she complaints of worsening OA- was told not to take aleve and tylenol not helping Reports being down and depressed but she is moving and hopeful that this will make her mood a lot better. Plans to start eating better and exercise once she moves. Knees bother her a lot when she walks though.  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  HENT: Positive for congestion (due to allergies). Negative for hearing loss, nosebleeds, sore throat and tinnitus.   Eyes: Positive for blurred vision (progressive).  Respiratory: Negative for cough, sputum production, shortness of breath and wheezing.   Cardiovascular: Positive for chest pain and leg swelling (after she has walked all day).  Gastrointestinal: Negative for heartburn, abdominal pain, diarrhea and constipation.  Genitourinary: Negative for dysuria, urgency and frequency.  Musculoskeletal: Positive for back pain, falls (due to pain in her knees, falls up the stairs at times) and joint pain.  Skin: Negative.   Neurological: Positive for headaches (sinus).  Negative for dizziness, tingling, tremors and weakness.  Endo/Heme/Allergies: Positive for environmental allergies.  Psychiatric/Behavioral: Positive for depression. The patient has insomnia. The patient is not nervous/anxious.      Past Medical History  Diagnosis Date  . Anemia   . Hypertension   . Arthritis    Past Surgical History  Procedure Laterality Date  . Breast lumpectomy Left 2010  . Tubal ligation     Social History:   reports that she has never smoked. She has never used smokeless tobacco. She reports that she does not drink alcohol or use illicit drugs.  Family History  Problem Relation Age of Onset  . Heart attack Mother   . Heart disease Mother   . Cancer Sister   . Stroke Maternal Grandmother     Medications: Patient's Medications  New Prescriptions   No medications on file  Previous Medications   IRON-VITAMIN C (VITRON-C PO)    Take by mouth. Take one tablet daily   LISINOPRIL-HYDROCHLOROTHIAZIDE (PRINZIDE,ZESTORETIC) 10-12.5 MG PER TABLET    Take 1 tablet by mouth daily.  Modified Medications   No medications on file  Discontinued Medications   No medications on file     Physical Exam:  Filed Vitals:   11/15/13 0928  BP: 130/90  Pulse: 77  Temp: 98.3 F (36.8 C)  TempSrc: Oral  Height: 5\' 1"  (1.549 m)  Weight: 213 lb (96.616 kg)  SpO2: 94%    Physical Exam  Constitutional: She is oriented to person, place, and time and well-developed,  well-nourished, and in no distress.  HENT:  Nose: Nose normal.  Mouth/Throat: Oropharynx is clear and moist. No oropharyngeal exudate.  Eyes: Conjunctivae and EOM are normal. Pupils are equal, round, and reactive to light.  Neck: Normal range of motion. Neck supple.  Cardiovascular: Normal rate, regular rhythm and normal heart sounds.   Pulmonary/Chest: Effort normal and breath sounds normal. No respiratory distress.  Abdominal: Soft. Bowel sounds are normal. She exhibits no distension. There is no  tenderness.  Musculoskeletal: She exhibits tenderness (to lumbar spine and bilateral knees). She exhibits no edema.  Neurological: She is alert and oriented to person, place, and time.  Skin: Skin is warm and dry. No erythema.  Psychiatric: Affect normal.     Labs reviewed: Basic Metabolic Panel:  Recent Labs  06/22/13 1506 09/19/13 0211 09/20/13 0220  NA 140 138 140  K 4.0 3.9 3.9  CL 100 99 104  CO2 29 25 25   GLUCOSE 111* 103* 100*  BUN 12 12 8   CREATININE 0.88 0.58 0.61  CALCIUM 9.5 9.3 8.7   CBC:  Recent Labs  06/22/13 1512 09/19/13 0211  09/19/13 1752 09/20/13 0220 09/20/13 1045  WBC 12.0* 15.2*  --   --  9.6  --   NEUTROABS  --  8.6*  --   --   --   --   HGB 11.3* 5.1*  < > 11.6* 11.3* 11.6*  HCT 37.7 16.1*  < > 35.1* 36.0 35.5*  MCV 81.5 83.9  --   --  82.0  --   PLT  --  424*  --   --  265  --   < > = values in this interval not displayed.  Assessment/Plan 1. Hypertension -controlled on medications, will follow up blood work and refill provided - Comprehensive metabolic panel - lisinopril-hydrochlorothiazide (PRINZIDE,ZESTORETIC) 10-12.5 MG per tablet; Take 1 tablet by mouth daily.  Dispense: 30 tablet; Refill: 0  2. Arthritis -worse in knees and back -tylenol 1000 mg TID as needed for pain  3. Anemia -conts on iron, will follow up CBC With differential/Platelet  4. Severe obesity (BMI >= 40) -discussed weight loss and increase in activity, to take tylenol TID for pain  - Lipid panel  Will follow up in 4 weeks for EV and MMSE and PAP

## 2013-11-15 NOTE — Patient Instructions (Signed)
Follow up in 4 weeks for EV   Cardiac Diet This diet can help prevent heart disease and stroke. Many factors influence your heart health, including eating and exercise habits. Coronary risk rises a lot with abnormal blood fat (lipid) levels. Cardiac meal planning includes limiting unhealthy fats, increasing healthy fats, and making other small dietary changes. General guidelines are as follows:  Adjust calorie intake to reach and maintain desirable body weight.  Limit total fat intake to less than 30% of total calories. Saturated fat should be less than 7% of calories.  Saturated fats are found in animal products and in some vegetable products. Saturated vegetable fats are found in coconut oil, cocoa butter, palm oil, and palm kernel oil. Read labels carefully to avoid these products as much as possible. Use butter in moderation. Choose tub margarines and oils that have 2 grams of fat or less. Good cooking oils are canola and olive oils.  Practice low-fat cooking techniques. Do not fry food. Instead, broil, bake, boil, steam, grill, roast on a rack, stir-fry, or microwave it. Other fat reducing suggestions include:  Remove the skin from poultry.  Remove all visible fat from meats.  Skim the fat off stews, soups, and gravies before serving them.  Steam vegetables in water or broth instead of sauting them in fat.  Avoid foods with trans fat (or hydrogenated oils), such as commercially fried foods and commercially baked goods. Commercial shortening and deep-frying fats will contain trans fat.  Increase intake of fruits, vegetables, whole grains, and legumes to replace foods high in fat.  Increase consumption of nuts, legumes, and seeds to at least 4 servings weekly. One serving of a legume equals  cup, and 1 serving of nuts or seeds equals  cup.  Choose whole grains more often. Have 3 servings per day (a serving is 1 ounce [oz]).  Eat 4 to 5 servings of vegetables per day. A serving of  vegetables is 1 cup of raw leafy vegetables;  cup of raw or cooked cut-up vegetables;  cup of vegetable juice.  Eat 4 to 5 servings of fruit per day. A serving of fruit is 1 medium whole fruit;  cup of dried fruit;  cup of fresh, frozen, or canned fruit;  cup of 100% fruit juice.  Increase your intake of dietary fiber to 20 to 30 grams per day. Insoluble fiber may help lower your risk of heart disease and may help curb your appetite.  Soluble fiber binds cholesterol to be removed from the blood. Foods high in soluble fiber are dried beans, citrus fruits, oats, apples, bananas, broccoli, Brussels sprouts, and eggplant.  Try to include foods fortified with plant sterols or stanols, such as yogurt, breads, juices, or margarines. Choose several fortified foods to achieve a daily intake of 2 to 3 grams of plant sterols or stanols.  Foods with omega-3 fats can help reduce your risk of heart disease. Aim to have a 3.5 oz portion of fatty fish twice per week, such as salmon, mackerel, albacore tuna, sardines, lake trout, or herring. If you wish to take a fish oil supplement, choose one that contains 1 gram of both DHA and EPA.  Limit processed meats to 2 servings (3 oz portion) weekly.  Limit the sodium in your diet to 1500 milligrams (mg) per day. If you have high blood pressure, talk to a registered dietitian about a DASH (Dietary Approaches to Stop Hypertension) eating plan.  Limit sweets and beverages with added sugar, such as soda,  to no more than 5 servings per week. One serving is:   1 tablespoon sugar.  1 tablespoon jelly or jam.   cup sorbet.  1 cup lemonade.   cup regular soda. CHOOSING FOODS Starches  Allowed: Breads: All kinds (wheat, rye, raisin, white, oatmeal, [place], [place], and English muffin bread). Low-fat rolls: English muffins, frankfurter and hamburger buns, bagels, pita bread, tortillas (not fried). Pancakes, waffles, biscuits, and muffins made with recommended  oil.  Avoid: Products made with saturated or trans fats, oils, or whole milk products. Butter rolls, cheese breads, croissants. Commercial doughnuts, muffins, sweet rolls, biscuits, waffles, pancakes, store-bought mixes. Crackers  Allowed: Low-fat crackers and snacks: Animal, graham, rye, saltine (with recommended oil, no lard), oyster, and matzo crackers. Bread sticks, melba toast, rusks, flatbread, pretzels, and light popcorn.  Avoid: High-fat crackers: cheese crackers, butter crackers, and those made with coconut, palm oil, or trans fat (hydrogenated oils). Buttered popcorn. Cereals  Allowed: Hot or cold whole-grain cereals.  Avoid: Cereals containing coconut, hydrogenated vegetable fat, or animal fat. Potatoes / Pasta / Rice  Allowed: All kinds of potatoes, rice, and pasta (such as macaroni, spaghetti, and noodles).  Avoid: Pasta or rice prepared with cream sauce or high-fat cheese. Chow mein noodles, [place] fries. Vegetables  Allowed: All vegetables and vegetable juices.  Avoid: Fried vegetables. Vegetables in cream, butter, or high-fat cheese sauces. Limit coconut. Fruit in cream or custard. Protein  Allowed: Limit your intake of meat, seafood, and poultry to no more than 6 oz (cooked weight) per day. All lean, well-trimmed beef, veal, pork, and lamb. All chicken and [place] without skin. All fish and shellfish. Wild game: wild duck, rabbit, pheasant, and venison. Egg whites or low-cholesterol egg substitutes may be used as desired. Meatless dishes: recipes with dried beans, peas, lentils, and tofu (soybean curd). Seeds and nuts: all seeds and most nuts.  Avoid: Prime grade and other heavily marbled and fatty meats, such as short ribs, spare ribs, rib eye roast or steak, frankfurters, sausage, bacon, and high-fat luncheon meats, mutton. Caviar. Commercially fried fish. Domestic duck, goose, venison sausage. Organ meats: liver, gizzard, heart, chitterlings, brains, kidney,  sweetbreads. Dairy  Allowed: Low-fat cheeses: nonfat or low-fat cottage cheese (1% or 2% fat), cheeses made with part skim milk, such as mozzarella, farmers, string, or ricotta. (Cheeses should be labeled no more than 2 to 6 grams fat per oz.). Skim (or 1%) milk: liquid, powdered, or evaporated. Buttermilk made with low-fat milk. Drinks made with skim or low-fat milk or cocoa. Chocolate milk or cocoa made with skim or low-fat (1%) milk. Nonfat or low-fat yogurt.  Avoid: Whole milk cheeses, including colby, cheddar, mu[place], Monterey Jack, [place], [place], [place], American, Swiss, and blue. Creamed cottage cheese, cream cheese. Whole milk and whole milk products, including buttermilk or yogurt made from whole milk, drinks made from whole milk. Condensed milk, evaporated whole milk, and 2% milk. Soups and Combination Foods  Allowed: Low-fat low-sodium soups: broth, dehydrated soups, homemade broth, soups with the fat removed, homemade cream soups made with skim or low-fat milk. Low-fat spaghetti, lasagna, chili, and Spanish rice if low-fat ingredients and low-fat cooking techniques are used.  Avoid: Cream soups made with whole milk, cream, or high-fat cheese. All other soups. Desserts and Sweets  Allowed: Sherbet, fruit ices, gelatins, meringues, and angel food cake. Homemade desserts with recommended fats, oils, and milk products. Jam, jelly, honey, marmalade, sugars, and syrups. Pure sugar candy, such as gum drops, hard candy, jelly beans, marshmallows, mints, and  small amounts of dark chocolate.  Avoid: Commercially prepared cakes, pies, cookies, frosting, pudding, or mixes for these products. Desserts containing whole milk products, chocolate, coconut, lard, palm oil, or palm kernel oil. Ice cream or ice cream drinks. Candy that contains chocolate, coconut, butter, hydrogenated fat, or unknown ingredients. Buttered syrups. Fats and Oils  Allowed: Vegetable oils: safflower, sunflower, corn,  soybean, cottonseed, sesame, canola, olive, or peanut. Non-hydrogenated margarines. Salad dressing or mayonnaise: homemade or commercial, made with a recommended oil. Low or nonfat salad dressing or mayonnaise.  Limit added fats and oils to 6 to 8 tsp per day (includes fats used in cooking, baking, salads, and spreads on bread). Remember to count the "hidden fats" in foods.  Avoid: Solid fats and shortenings: butter, lard, salt pork, bacon drippings. Gravy containing meat fat, shortening, or suet. Cocoa butter, coconut. Coconut oil, palm oil, palm kernel oil, or hydrogenated oils: these ingredients are often used in bakery products, nondairy creamers, whipped toppings, candy, and commercially fried foods. Read labels carefully. Salad dressings made of unknown oils, sour cream, or cheese, such as blue cheese and [place]ef[place]. Cream, all kinds: half-and-half, light, heavy, or whipping. Sour cream or cream cheese (even if "light" or low-fat). Nondairy cream substitutes: coffee creamers and sour cream substitutes made with palm, palm kernel, hydrogenated oils, or coconut oil. Beverages  Allowed: Coffee (regular or decaffeinated), tea. Diet carbonated beverages, mineral water. Alcohol: Check with your caregiver. Moderation is recommended.  Avoid: Whole milk, regular sodas, and juice drinks with added sugar. Condiments  Allowed: All seasonings and condiments. Cocoa powder. "Cream" sauces made with recommended ingredients.  Avoid: Carob powder made with hydrogenated fats. SAMPLE MENU Breakfast   cup orange juice   cup oatmeal  1 slice toast  1 tsp margarine  1 cup skim milk Lunch  [place] sandwich with 2 oz [place], 2 slices bread  Lettuce and tomato slices  Fresh fruit  Carrot sticks  Coffee or tea Snack  Fresh fruit or low-fat crackers Dinner  3 oz lean ground beef  1 baked potato  1 tsp margarine   cup asparagus  Lettuce salad  1 tbs non-creamy dressing   cup peach  slices  1 cup skim milk Document Released: 03/16/2008 Document Revised: 12/07/2011 Document Reviewed: 08/31/2011 Exit[employer] Patient Information 2014 [place], [place].

## 2013-11-16 LAB — CBC WITH DIFFERENTIAL
BASOS ABS: 0 10*3/uL (ref 0.0–0.2)
Basos: 0 %
EOS: 1 %
Eosinophils Absolute: 0.1 10*3/uL (ref 0.0–0.4)
HCT: 37 % (ref 34.0–46.6)
Hemoglobin: 11.8 g/dL (ref 11.1–15.9)
IMMATURE GRANS (ABS): 0 10*3/uL (ref 0.0–0.1)
IMMATURE GRANULOCYTES: 0 %
LYMPHS: 26 %
Lymphocytes Absolute: 2.5 10*3/uL (ref 0.7–3.1)
MCH: 27.1 pg (ref 26.6–33.0)
MCHC: 31.9 g/dL (ref 31.5–35.7)
MCV: 85 fL (ref 79–97)
MONOCYTES: 6 %
Monocytes Absolute: 0.6 10*3/uL (ref 0.1–0.9)
Neutrophils Absolute: 6.7 10*3/uL (ref 1.4–7.0)
Neutrophils Relative %: 67 %
PLATELETS: 283 10*3/uL (ref 150–379)
RBC: 4.36 x10E6/uL (ref 3.77–5.28)
RDW: 18.6 % — AB (ref 12.3–15.4)
WBC: 9.9 10*3/uL (ref 3.4–10.8)

## 2013-11-16 LAB — LIPID PANEL
CHOL/HDL RATIO: 3.6 ratio (ref 0.0–4.4)
Cholesterol, Total: 193 mg/dL (ref 100–199)
HDL: 53 mg/dL (ref >39)
LDL Calculated: 123 mg/dL — ABNORMAL HIGH (ref 0–99)
Triglycerides: 83 mg/dL (ref 0–149)
VLDL CHOLESTEROL CAL: 17 mg/dL (ref 5–40)

## 2013-11-16 LAB — COMPREHENSIVE METABOLIC PANEL
A/G RATIO: 1.3 (ref 1.1–2.5)
ALT: 10 IU/L (ref 0–32)
AST: 15 IU/L (ref 0–40)
Albumin: 4.3 g/dL (ref 3.6–4.8)
Alkaline Phosphatase: 84 IU/L (ref 39–117)
BUN/Creatinine Ratio: 16 (ref 11–26)
BUN: 12 mg/dL (ref 8–27)
CO2: 22 mmol/L (ref 18–29)
Calcium: 9.7 mg/dL (ref 8.7–10.3)
Chloride: 96 mmol/L — ABNORMAL LOW (ref 97–108)
Creatinine, Ser: 0.74 mg/dL (ref 0.57–1.00)
GFR, EST AFRICAN AMERICAN: 98 mL/min/{1.73_m2} (ref >59)
GFR, EST NON AFRICAN AMERICAN: 85 mL/min/{1.73_m2} (ref >59)
GLUCOSE: 98 mg/dL (ref 65–99)
Globulin, Total: 3.4 g/dL (ref 1.5–4.5)
Potassium: 4.1 mmol/L (ref 3.5–5.2)
SODIUM: 141 mmol/L (ref 134–144)
TOTAL PROTEIN: 7.7 g/dL (ref 6.0–8.5)
Total Bilirubin: 0.5 mg/dL (ref 0.0–1.2)

## 2013-11-26 ENCOUNTER — Other Ambulatory Visit: Payer: Self-pay | Admitting: *Deleted

## 2013-11-26 DIAGNOSIS — I10 Essential (primary) hypertension: Secondary | ICD-10-CM

## 2013-11-26 MED ORDER — LISINOPRIL-HYDROCHLOROTHIAZIDE 10-12.5 MG PO TABS: 1.0000 | ORAL_TABLET | Freq: Every day | ORAL | Status: DC

## 2013-11-26 NOTE — Telephone Encounter (Signed)
Patient Requested medication to be refaxed because pharmacy didn't receive

## 2014-01-30 ENCOUNTER — Encounter: Payer: Self-pay | Admitting: Nurse Practitioner

## 2014-01-31 ENCOUNTER — Ambulatory Visit (INDEPENDENT_AMBULATORY_CARE_PROVIDER_SITE_OTHER): Payer: Medicare Other | Admitting: Nurse Practitioner

## 2014-01-31 ENCOUNTER — Encounter: Payer: Self-pay | Admitting: Nurse Practitioner

## 2014-01-31 VITALS — BP 146/88 | HR 77 | Temp 98.2°F | Ht 61.0 in | Wt 212.8 lb

## 2014-01-31 DIAGNOSIS — Z124 Encounter for screening for malignant neoplasm of cervix: Secondary | ICD-10-CM

## 2014-01-31 DIAGNOSIS — R87610 Atypical squamous cells of undetermined significance on cytologic smear of cervix (ASC-US): Secondary | ICD-10-CM | POA: Insufficient documentation

## 2014-01-31 DIAGNOSIS — M1990 Unspecified osteoarthritis, unspecified site: Secondary | ICD-10-CM

## 2014-01-31 DIAGNOSIS — D500 Iron deficiency anemia secondary to blood loss (chronic): Secondary | ICD-10-CM

## 2014-01-31 DIAGNOSIS — I10 Essential (primary) hypertension: Secondary | ICD-10-CM

## 2014-01-31 DIAGNOSIS — M129 Arthropathy, unspecified: Secondary | ICD-10-CM

## 2014-01-31 NOTE — Progress Notes (Signed)
Passed clock drawing 
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
## 2014-01-31 NOTE — Patient Instructions (Addendum)
Follow up in 4 months with Dr Bubba Camp  Cont heart healthy diet and increase exercise   Cardiac Diet This diet can help prevent heart disease and stroke. Many factors influence your heart health, including eating and exercise habits. Coronary risk rises a lot with abnormal blood fat (lipid) levels. Cardiac meal planning includes limiting unhealthy fats, increasing healthy fats, and making other small dietary changes. General guidelines are as follows:  Adjust calorie intake to reach and maintain desirable body weight.  Limit total fat intake to less than 30% of total calories. Saturated fat should be less than 7% of calories.  Saturated fats are found in animal products and in some vegetable products. Saturated vegetable fats are found in coconut oil, cocoa butter, palm oil, and palm kernel oil. Read labels carefully to avoid these products as much as possible. Use butter in moderation. Choose tub margarines and oils that have 2 grams of fat or less. Good cooking oils are canola and olive oils.  Practice low-fat cooking techniques. Do not fry food. Instead, broil, bake, boil, steam, grill, roast on a rack, stir-fry, or microwave it. Other fat reducing suggestions include:  Remove the skin from poultry.  Remove all visible fat from meats.  Skim the fat off stews, soups, and gravies before serving them.  Steam vegetables in water or broth instead of sauting them in fat.  Avoid foods with trans fat (or hydrogenated oils), such as commercially fried foods and commercially baked goods. Commercial shortening and deep-frying fats will contain trans fat.  Increase intake of fruits, vegetables, whole grains, and legumes to replace foods high in fat.  Increase consumption of nuts, legumes, and seeds to at least 4 servings weekly. One serving of a legume equals  cup, and 1 serving of nuts or seeds equals  cup.  Choose whole grains more often. Have 3 servings per day (a serving is 1 ounce  [oz]).  Eat 4 to 5 servings of vegetables per day. A serving of vegetables is 1 cup of raw leafy vegetables;  cup of raw or cooked cut-up vegetables;  cup of vegetable juice.  Eat 4 to 5 servings of fruit per day. A serving of fruit is 1 medium whole fruit;  cup of dried fruit;  cup of fresh, frozen, or canned fruit;  cup of 100% fruit juice.  Increase your intake of dietary fiber to 20 to 30 grams per day. Insoluble fiber may help lower your risk of heart disease and may help curb your appetite.  Soluble fiber binds cholesterol to be removed from the blood. Foods high in soluble fiber are dried beans, citrus fruits, oats, apples, bananas, broccoli, Brussels sprouts, and eggplant.  Try to include foods fortified with plant sterols or stanols, such as yogurt, breads, juices, or margarines. Choose several fortified foods to achieve a daily intake of 2 to 3 grams of plant sterols or stanols.  Foods with omega-3 fats can help reduce your risk of heart disease. Aim to have a 3.5 oz portion of fatty fish twice per week, such as salmon, mackerel, albacore tuna, sardines, lake trout, or herring. If you wish to take a fish oil supplement, choose one that contains 1 gram of both DHA and EPA.  Limit processed meats to 2 servings (3 oz portion) weekly.  Limit the sodium in your diet to 1500 milligrams (mg) per day. If you have high blood pressure, talk to a registered dietitian about a DASH (Dietary Approaches to Stop Hypertension) eating plan.  Limit  sweets and beverages with added sugar, such as soda, to no more than 5 servings per week. One serving is:   1 tablespoon sugar.  1 tablespoon jelly or jam.   cup sorbet.  1 cup lemonade.   cup regular soda. CHOOSING FOODS Starches  Allowed: Breads: All kinds (wheat, rye, raisin, white, oatmeal, [place], [place], and English muffin bread). Low-fat rolls: English muffins, frankfurter and hamburger buns, bagels, pita bread, tortillas (not fried).  Pancakes, waffles, biscuits, and muffins made with recommended oil.  Avoid: Products made with saturated or trans fats, oils, or whole milk products. Butter rolls, cheese breads, croissants. Commercial doughnuts, muffins, sweet rolls, biscuits, waffles, pancakes, store-bought mixes. Crackers  Allowed: Low-fat crackers and snacks: Animal, graham, rye, saltine (with recommended oil, no lard), oyster, and matzo crackers. Bread sticks, melba toast, rusks, flatbread, pretzels, and light popcorn.  Avoid: High-fat crackers: cheese crackers, butter crackers, and those made with coconut, palm oil, or trans fat (hydrogenated oils). Buttered popcorn. Cereals  Allowed: Hot or cold whole-grain cereals.  Avoid: Cereals containing coconut, hydrogenated vegetable fat, or animal fat. Potatoes / Pasta / Rice  Allowed: All kinds of potatoes, rice, and pasta (such as macaroni, spaghetti, and noodles).  Avoid: Pasta or rice prepared with cream sauce or high-fat cheese. Chow mein noodles, [place] fries. Vegetables  Allowed: All vegetables and vegetable juices.  Avoid: Fried vegetables. Vegetables in cream, butter, or high-fat cheese sauces. Limit coconut. Fruit in cream or custard. Protein  Allowed: Limit your intake of meat, seafood, and poultry to no more than 6 oz (cooked weight) per day. All lean, well-trimmed beef, veal, pork, and lamb. All chicken and [place] without skin. All fish and shellfish. Wild game: wild duck, rabbit, pheasant, and venison. Egg whites or low-cholesterol egg substitutes may be used as desired. Meatless dishes: recipes with dried beans, peas, lentils, and tofu (soybean curd). Seeds and nuts: all seeds and most nuts.  Avoid: Prime grade and other heavily marbled and fatty meats, such as short ribs, spare ribs, rib eye roast or steak, frankfurters, sausage, bacon, and high-fat luncheon meats, mutton. Caviar. Commercially fried fish. Domestic duck, goose, venison sausage. Organ meats:  liver, gizzard, heart, chitterlings, brains, kidney, sweetbreads. Dairy  Allowed: Low-fat cheeses: nonfat or low-fat cottage cheese (1% or 2% fat), cheeses made with part skim milk, such as mozzarella, farmers, string, or ricotta. (Cheeses should be labeled no more than 2 to 6 grams fat per oz.). Skim (or 1%) milk: liquid, powdered, or evaporated. Buttermilk made with low-fat milk. Drinks made with skim or low-fat milk or cocoa. Chocolate milk or cocoa made with skim or low-fat (1%) milk. Nonfat or low-fat yogurt.  Avoid: Whole milk cheeses, including colby, cheddar, [place], Monterey Jack, [place], [place], [place], American, Swiss, and blue. Creamed cottage cheese, cream cheese. Whole milk and whole milk products, including buttermilk or yogurt made from whole milk, drinks made from whole milk. Condensed milk, evaporated whole milk, and 2% milk. Soups and Combination Foods  Allowed: Low-fat low-sodium soups: broth, dehydrated soups, homemade broth, soups with the fat removed, homemade cream soups made with skim or low-fat milk. Low-fat spaghetti, lasagna, chili, and Spanish rice if low-fat ingredients and low-fat cooking techniques are used.  Avoid: Cream soups made with whole milk, cream, or high-fat cheese. All other soups. Desserts and Sweets  Allowed: Sherbet, fruit ices, gelatins, meringues, and angel food cake. Homemade desserts with recommended fats, oils, and milk products. Jam, jelly, honey, marmalade, sugars, and syrups. Pure sugar candy, such as  gum drops, hard candy, jelly beans, marshmallows, mints, and small amounts of dark chocolate.  Avoid: Commercially prepared cakes, pies, cookies, frosting, pudding, or mixes for these products. Desserts containing whole milk products, chocolate, coconut, lard, palm oil, or palm kernel oil. Ice cream or ice cream drinks. Candy that contains chocolate, coconut, butter, hydrogenated fat, or unknown ingredients. Buttered syrups. Fats and  Oils  Allowed: Vegetable oils: safflower, sunflower, corn, soybean, cottonseed, sesame, canola, olive, or peanut. Non-hydrogenated margarines. Salad dressing or mayonnaise: homemade or commercial, made with a recommended oil. Low or nonfat salad dressing or mayonnaise.  Limit added fats and oils to 6 to 8 tsp per day (includes fats used in cooking, baking, salads, and spreads on bread). Remember to count the "hidden fats" in foods.  Avoid: Solid fats and shortenings: butter, lard, salt pork, bacon drippings. Gravy containing meat fat, shortening, or suet. Cocoa butter, coconut. Coconut oil, palm oil, palm kernel oil, or hydrogenated oils: these ingredients are often used in bakery products, nondairy creamers, whipped toppings, candy, and commercially fried foods. Read labels carefully. Salad dressings made of unknown oils, sour cream, or cheese, such as blue cheese and Roquef[place]. Cream, all kinds: half-and-half, light, heavy, or whipping. Sour cream or cream cheese (even if "light" or low-fat). Nondairy cream substitutes: coffee creamers and sour cream substitutes made with palm, palm kernel, hydrogenated oils, or coconut oil. Beverages  Allowed: Coffee (regular or decaffeinated), tea. Diet carbonated beverages, mineral water. Alcohol: Check with your caregiver. Moderation is recommended.  Avoid: Whole milk, regular sodas, and juice drinks with added sugar. Condiments  Allowed: All seasonings and condiments. Cocoa powder. "Cream" sauces made with recommended ingredients.  Avoid: Carob powder made with hydrogenated fats. SAMPLE MENU Breakfast   cup orange juice   cup oatmeal  1 slice toast  1 tsp margarine  1 cup skim milk Lunch  [place] sandwich with 2 oz [place], 2 slices bread  Lettuce and tomato slices  Fresh fruit  Carrot sticks  Coffee or tea Snack  Fresh fruit or low-fat crackers Dinner  3 oz lean ground beef  1 baked potato  1 tsp margarine   cup  asparagus  Lettuce salad  1 tbs non-creamy dressing   cup peach slices  1 cup skim milk Document Released: 03/16/2008 Document Revised: 12/07/2011 Document Reviewed: 08/07/2013 Exit[employer] Patient Information 2015 [place], [place]. This information is not intended to replace advice given to you by your health care provider. Make sure you discuss any questions you have with your health care provider.  Preventive Care for Adults A healthy lifestyle and preventive care can promote health and wellness. Preventive health guidelines for women include the following key practices.  A routine yearly physical is a good way to check with your health care provider about your health and preventive screening. It is a chance to share any concerns and updates on your health and to receive a thorough exam.  Visit your dentist for a routine exam and preventive care every 6 months. Brush your teeth twice a day and floss once a day. Good oral hygiene prevents tooth decay and gum disease.  The frequency of eye exams is based on your age, health, family medical history, use of contact lenses, and other factors. Follow your health care provider's recommendations for frequency of eye exams.  Eat a healthy diet. Foods like vegetables, fruits, whole grains, low-fat dairy products, and lean protein foods contain the nutrients you need without too many calories. Decrease your intake of foods high  in solid fats, added sugars, and salt. Eat the right amount of calories for you.Get information about a proper diet from your health care provider, if necessary.  Regular physical exercise is one of the most important things you can do for your health. Most adults should get at least 150 minutes of moderate-intensity exercise (any activity that increases your heart rate and causes you to sweat) each week. In addition, most adults need muscle-strengthening exercises on 2 or more days a week.  Maintain a healthy weight. The body  mass index (BMI) is a screening tool to identify possible weight problems. It provides an estimate of body fat based on height and weight. Your health care provider can find your BMI and can help you achieve or maintain a healthy weight.For adults 20 years and older:  A BMI below 18.5 is considered underweight.  A BMI of 18.5 to 24.9 is normal.  A BMI of 25 to 29.9 is considered overweight.  A BMI of 30 and above is considered obese.  Maintain normal blood lipids and cholesterol levels by exercising and minimizing your intake of saturated fat. Eat a balanced diet with plenty of fruit and vegetables. Blood tests for lipids and cholesterol should begin at age 85 and be repeated every 5 years. If your lipid or cholesterol levels are high, you are over 50, or you are at high risk for heart disease, you may need your cholesterol levels checked more frequently.Ongoing high lipid and cholesterol levels should be treated with medicines if diet and exercise are not working.  If you smoke, find out from your health care provider how to quit. If you do not use tobacco, do not start.  Lung cancer screening is recommended for adults aged 67-80 years who are at high risk for developing lung cancer because of a history of smoking. A yearly low-dose CT scan of the lungs is recommended for people who have at least a 30-pack-year history of smoking and are a current smoker or have quit within the past 15 years. A pack year of smoking is smoking an average of 1 pack of cigarettes a day for 1 year (for example: 1 pack a day for 30 years or 2 packs a day for 15 years). Yearly screening should continue until the smoker has stopped smoking for at least 15 years. Yearly screening should be stopped for people who develop a health problem that would prevent them from having lung cancer treatment.  If you are pregnant, do not drink alcohol. If you are breastfeeding, be very cautious about drinking alcohol. If you are not  pregnant and choose to drink alcohol, do not have more than 1 drink per day. One drink is considered to be 12 ounces (355 mL) of beer, 5 ounces (148 mL) of wine, or 1.5 ounces (44 mL) of liquor.  Avoid use of street drugs. Do not share needles with anyone. Ask for help if you need support or instructions about stopping the use of drugs.  High blood pressure causes heart disease and increases the risk of stroke. Your blood pressure should be checked at least every 1 to 2 years. Ongoing high blood pressure should be treated with medicines if weight loss and exercise do not work.  If you are 48-57 years old, ask your health care provider if you should take aspirin to prevent strokes.  Diabetes screening involves taking a blood sample to check your fasting blood sugar level. This should be done once every 3 years, after age  51, if you are within normal weight and without risk factors for diabetes. Testing should be considered at a younger age or be carried out more frequently if you are overweight and have at least 1 risk factor for diabetes.  Breast cancer screening is essential preventive care for women. You should practice "breast self-awareness." This means understanding the normal appearance and feel of your breasts and may include breast self-examination. Any changes detected, no matter how small, should be reported to a health care provider. Women in their 81s and 30s should have a clinical breast exam (CBE) by a health care provider as part of a regular health exam every 1 to 3 years. After age 26, women should have a CBE every year. Starting at age 38, women should consider having a mammogram (breast X-ray test) every year. Women who have a family history of breast cancer should talk to their health care provider about genetic screening. Women at a high risk of breast cancer should talk to their health care providers about having an MRI and a mammogram every year.  Breast cancer gene (BRCA)-related  cancer risk assessment is recommended for women who have family members with BRCA-related cancers. BRCA-related cancers include breast, ovarian, tubal, and peritoneal cancers. Having family members with these cancers may be associated with an increased risk for harmful changes (mutations) in the breast cancer genes BRCA1 and BRCA2. Results of the assessment will determine the need for genetic counseling and BRCA1 and BRCA2 testing.  Routine pelvic exams to screen for cancer are no longer recommended for nonpregnant women who are considered low risk for cancer of the pelvic organs (ovaries, uterus, and vagina) and who do not have symptoms. Ask your health care provider if a screening pelvic exam is right for you.  If you have had past treatment for cervical cancer or a condition that could lead to cancer, you need Pap tests and screening for cancer for at least 20 years after your treatment. If Pap tests have been discontinued, your risk factors (such as having a new sexual partner) need to be reassessed to determine if screening should be resumed. Some women have medical problems that increase the chance of getting cervical cancer. In these cases, your health care provider may recommend more frequent screening and Pap tests.  The HPV test is an additional test that may be used for cervical cancer screening. The HPV test looks for the virus that can cause the cell changes on the cervix. The cells collected during the Pap test can be tested for HPV. The HPV test could be used to screen women aged 42 years and older, and should be used in women of any age who have unclear Pap test results. After the age of 78, women should have HPV testing at the same frequency as a Pap test.  Colorectal cancer can be detected and often prevented. Most routine colorectal cancer screening begins at the age of 15 years and continues through age 73 years. However, your health care provider may recommend screening at an earlier age  if you have risk factors for colon cancer. On a yearly basis, your health care provider may provide home test kits to check for hidden blood in the stool. Use of a small camera at the end of a tube, to directly examine the colon (sigmoidoscopy or colonoscopy), can detect the earliest forms of colorectal cancer. Talk to your health care provider about this at age 54, when routine screening begins. Direct exam of the colon  should be repeated every 5-10 years through age 78 years, unless early forms of pre-cancerous polyps or small growths are found.  People who are at an increased risk for hepatitis B should be screened for this virus. You are considered at high risk for hepatitis B if:  You were born in a country where hepatitis B occurs often. Talk with your health care provider about which countries are considered high risk.  Your parents were born in a high-risk country and you have not received a shot to protect against hepatitis B (hepatitis B vaccine).  You have HIV or AIDS.  You use needles to inject street drugs.  You live with, or have sex with, someone who has hepatitis B.  You get hemodialysis treatment.  You take certain medicines for conditions like cancer, organ transplantation, and autoimmune conditions.  Hepatitis C blood testing is recommended for all people born from [birth year] through 1964 and any individual with known risks for hepatitis C.  Practice safe sex. Use condoms and avoid high-risk sexual practices to reduce the spread of sexually transmitted infections (STIs). STIs include gonorrhea, chlamydia, syphilis, trichomonas, herpes, HPV, and human immunodeficiency virus (HIV). Herpes, HIV, and HPV are viral illnesses that have no cure. They can result in disability, cancer, and death.  You should be screened for sexually transmitted illnesses (STIs) including gonorrhea and chlamydia if:  You are sexually active and are younger than 24 years.  You are older than 24 years and  your health care provider tells you that you are at risk for this type of infection.  Your sexual activity has changed since you were last screened and you are at an increased risk for chlamydia or gonorrhea. Ask your health care provider if you are at risk.  If you are at risk of being infected with HIV, it is recommended that you take a prescription medicine daily to prevent HIV infection. This is called preexposure prophylaxis (PrEP). You are considered at risk if:  You are a heterosexual woman, are sexually active, and are at increased risk for HIV infection.  You take drugs by injection.  You are sexually active with a partner who has HIV.  Talk with your health care provider about whether you are at high risk of being infected with HIV. If you choose to begin PrEP, you should first be tested for HIV. You should then be tested every 3 months for as long as you are taking PrEP.  Osteoporosis is a disease in which the bones lose minerals and strength with aging. This can result in serious bone fractures or breaks. The risk of osteoporosis can be identified using a bone density scan. Women ages 76 years and over and women at risk for fractures or osteoporosis should discuss screening with their health care providers. Ask your health care provider whether you should take a calcium supplement or vitamin D to reduce the rate of osteoporosis.  Menopause can be associated with physical symptoms and risks. Hormone replacement therapy is available to decrease symptoms and risks. You should talk to your health care provider about whether hormone replacement therapy is right for you.  Use sunscreen. Apply sunscreen liberally and repeatedly throughout the day. You should seek shade when your shadow is shorter than you. Protect yourself by wearing long sleeves, pants, a wide-brimmed hat, and sunglasses year round, whenever you are outdoors.  Once a month, do a whole body skin exam, using a mirror to look  at the skin on your back. Tell  your health care provider of new moles, moles that have irregular borders, moles that are larger than a pencil eraser, or moles that have changed in shape or color.  Stay current with required vaccines (immunizations).  Influenza vaccine. All adults should be immunized every year.  Tetanus, diphtheria, and acellular pertussis (Td, Tdap) vaccine. Pregnant women should receive 1 dose of Tdap vaccine during each pregnancy. The dose should be obtained regardless of the length of time since the last dose. Immunization is preferred during the 27th-36th week of gestation. An adult who has not previously received Tdap or who does not know her vaccine status should receive 1 dose of Tdap. This initial dose should be followed by tetanus and diphtheria toxoids (Td) booster doses every 10 years. Adults with an unknown or incomplete history of completing a 3-dose immunization series with Td-containing vaccines should begin or complete a primary immunization series including a Tdap dose. Adults should receive a Td booster every 10 years.  Varicella vaccine. An adult without evidence of immunity to varicella should receive 2 doses or a second dose if she has previously received 1 dose. Pregnant females who do not have evidence of immunity should receive the first dose after pregnancy. This first dose should be obtained before leaving the health care facility. The second dose should be obtained 4-8 weeks after the first dose.  Human papillomavirus (HPV) vaccine. Females aged 13-26 years who have not received the vaccine previously should obtain the 3-dose series. The vaccine is not recommended for use in pregnant females. However, pregnancy testing is not needed before receiving a dose. If a female is found to be pregnant after receiving a dose, no treatment is needed. In that case, the remaining doses should be delayed until after the pregnancy. Immunization is recommended for any person  with an immunocompromised condition through the age of 2 years if she did not get any or all doses earlier. During the 3-dose series, the second dose should be obtained 4-8 weeks after the first dose. The third dose should be obtained 24 weeks after the first dose and 16 weeks after the second dose.  Zoster vaccine. One dose is recommended for adults aged 34 years or older unless certain conditions are present.  Measles, mumps, and rubella (MMR) vaccine. Adults born before [birth year] generally are considered immune to measles and mumps. Adults born in [birth year] or later should have 1 or more doses of MMR vaccine unless there is a contraindication to the vaccine or there is laboratory evidence of immunity to each of the three diseases. A routine second dose of MMR vaccine should be obtained at least 28 days after the first dose for students attending postsecondary schools, health care workers, or international travelers. People who received inactivated measles vaccine or an unknown type of measles vaccine during 1963-1967 should receive 2 doses of MMR vaccine. People who received inactivated mumps vaccine or an unknown type of mumps vaccine before 1979 and are at high risk for mumps infection should consider immunization with 2 doses of MMR vaccine. For females of childbearing age, rubella immunity should be determined. If there is no evidence of immunity, females who are not pregnant should be vaccinated. If there is no evidence of immunity, females who are pregnant should delay immunization until after pregnancy. Unvaccinated health care workers born before 24 who lack laboratory evidence of measles, mumps, or rubella immunity or laboratory confirmation of disease should consider measles and mumps immunization with 2 doses of MMR vaccine or rubella  immunization with 1 dose of MMR vaccine.  Pneumococcal 13-valent conjugate (PCV13) vaccine. When indicated, a person who is uncertain of her immunization history and has  no record of immunization should receive the PCV13 vaccine. An adult aged 46 years or older who has certain medical conditions and has not been previously immunized should receive 1 dose of PCV13 vaccine. This PCV13 should be followed with a dose of pneumococcal polysaccharide (PPSV23) vaccine. The PPSV23 vaccine dose should be obtained at least 8 weeks after the dose of PCV13 vaccine. An adult aged 76 years or older who has certain medical conditions and previously received 1 or more doses of PPSV23 vaccine should receive 1 dose of PCV13. The PCV13 vaccine dose should be obtained 1 or more years after the last PPSV23 vaccine dose.  Pneumococcal polysaccharide (PPSV23) vaccine. When PCV13 is also indicated, PCV13 should be obtained first. All adults aged 39 years and older should be immunized. An adult younger than age 57 years who has certain medical conditions should be immunized. Any person who resides in a nursing home or long-term care facility should be immunized. An adult smoker should be immunized. People with an immunocompromised condition and certain other conditions should receive both PCV13 and PPSV23 vaccines. People with human immunodeficiency virus (HIV) infection should be immunized as soon as possible after diagnosis. Immunization during chemotherapy or radiation therapy should be avoided. Routine use of PPSV23 vaccine is not recommended for American Indians, [place] Natives, or people younger than 65 years unless there are medical conditions that require PPSV23 vaccine. When indicated, people who have unknown immunization and have no record of immunization should receive PPSV23 vaccine. One-time revaccination 5 years after the first dose of PPSV23 is recommended for people aged 19-64 years who have chronic kidney failure, nephrotic syndrome, asplenia, or immunocompromised conditions. People who received 1-2 doses of PPSV23 before age 4 years should receive another dose of PPSV23 vaccine at age 18  years or later if at least 5 years have passed since the previous dose. Doses of PPSV23 are not needed for people immunized with PPSV23 at or after age 69 years.  Meningococcal vaccine. Adults with asplenia or persistent complement component deficiencies should receive 2 doses of quadrivalent meningococcal conjugate (MenACWY-D) vaccine. The doses should be obtained at least 2 months apart. Microbiologists working with certain meningococcal bacteria, [employer] recruits, people at risk during an outbreak, and people who travel to or live in countries with a high rate of meningitis should be immunized. A first-year college student up through age 70 years who is living in a residence hall should receive a dose if she did not receive a dose on or after her 16th birthday. Adults who have certain high-risk conditions should receive one or more doses of vaccine.  Hepatitis A vaccine. Adults who wish to be protected from this disease, have certain high-risk conditions, work with hepatitis A-infected animals, work in hepatitis A research labs, or travel to or work in countries with a high rate of hepatitis A should be immunized. Adults who were previously unvaccinated and who anticipate close contact with an international adoptee during the first 60 days after arrival in the [place] from a country with a high rate of hepatitis A should be immunized.  Hepatitis B vaccine. Adults who wish to be protected from this disease, have certain high-risk conditions, may be exposed to blood or other infectious body fluids, are household contacts or sex partners of hepatitis B positive people, are clients or workers in  certain care facilities, or travel to or work in countries with a high rate of hepatitis B should be immunized.  Haemophilus influenzae type b (Hib) vaccine. A previously unvaccinated person with asplenia or sickle cell disease or having a scheduled splenectomy should receive 1 dose of Hib vaccine. Regardless  of previous immunization, a recipient of a hematopoietic stem cell transplant should receive a 3-dose series 6-12 months after her successful transplant. Hib vaccine is not recommended for adults with HIV infection. Preventive Services / Frequency Ages 51 to 29 years  Blood pressure check.** / Every 1 to 2 years.  Lipid and cholesterol check.** / Every 5 years beginning at age 66.  Clinical breast exam.** / Every 3 years for women in their 58s and 2s.  BRCA-related cancer risk assessment.** / For women who have family members with a BRCA-related cancer (breast, ovarian, tubal, or peritoneal cancers).  Pap test.** / Every 2 years from ages 37 through 36. Every 3 years starting at age 3 through age 16 or 66 with a history of 3 consecutive normal Pap tests.  HPV screening.** / Every 3 years from ages 27 through ages 25 to 30 with a history of 3 consecutive normal Pap tests.  Hepatitis C blood test.** / For any individual with known risks for hepatitis C.  Skin self-exam. / Monthly.  Influenza vaccine. / Every year.  Tetanus, diphtheria, and acellular pertussis (Tdap, Td) vaccine.** / Consult your health care provider. Pregnant women should receive 1 dose of Tdap vaccine during each pregnancy. 1 dose of Td every 10 years.  Varicella vaccine.** / Consult your health care provider. Pregnant females who do not have evidence of immunity should receive the first dose after pregnancy.  HPV vaccine. / 3 doses over 6 months, if 24 and younger. The vaccine is not recommended for use in pregnant females. However, pregnancy testing is not needed before receiving a dose.  Measles, mumps, rubella (MMR) vaccine.** / You need at least 1 dose of MMR if you were born in 1957 or later. You may also need a 2nd dose. For females of childbearing age, rubella immunity should be determined. If there is no evidence of immunity, females who are not pregnant should be vaccinated. If there is no evidence of immunity,  females who are pregnant should delay immunization until after pregnancy.  Pneumococcal 13-valent conjugate (PCV13) vaccine.** / Consult your health care provider.  Pneumococcal polysaccharide (PPSV23) vaccine.** / 1 to 2 doses if you smoke cigarettes or if you have certain conditions.  Meningococcal vaccine.** / 1 dose if you are age 11 to 51 years and a Market researcher living in a residence hall, or have one of several medical conditions, you need to get vaccinated against meningococcal disease. You may also need additional booster doses.  Hepatitis A vaccine.** / Consult your health care provider.  Hepatitis B vaccine.** / Consult your health care provider.  Haemophilus influenzae type b (Hib) vaccine.** / Consult your health care provider. Ages 63 to 15 years  Blood pressure check.** / Every 1 to 2 years.  Lipid and cholesterol check.** / Every 5 years beginning at age 25 years.  Lung cancer screening. / Every year if you are aged 30-80 years and have a 30-pack-year history of smoking and currently smoke or have quit within the past 15 years. Yearly screening is stopped once you have quit smoking for at least 15 years or develop a health problem that would prevent you from having lung cancer treatment.  Clinical breast exam.** / Every year after age 63 years.  BRCA-related cancer risk assessment.** / For women who have family members with a BRCA-related cancer (breast, ovarian, tubal, or peritoneal cancers).  Mammogram.** / Every year beginning at age 77 years and continuing for as long as you are in good health. Consult with your health care provider.  Pap test.** / Every 3 years starting at age 61 years through age 23 or 86 years with a history of 3 consecutive normal Pap tests.  HPV screening.** / Every 3 years from ages 96 years through ages 26 to 84 years with a history of 3 consecutive normal Pap tests.  Fecal occult blood test (FOBT) of stool. / Every year  beginning at age 11 years and continuing until age 47 years. You may not need to do this test if you get a colonoscopy every 10 years.  Flexible sigmoidoscopy or colonoscopy.** / Every 5 years for a flexible sigmoidoscopy or every 10 years for a colonoscopy beginning at age 9 years and continuing until age 71 years.  Hepatitis C blood test.** / For all people born from [birth year] through 1965 and any individual with known risks for hepatitis C.  Skin self-exam. / Monthly.  Influenza vaccine. / Every year.  Tetanus, diphtheria, and acellular pertussis (Tdap/Td) vaccine.** / Consult your health care provider. Pregnant women should receive 1 dose of Tdap vaccine during each pregnancy. 1 dose of Td every 10 years.  Varicella vaccine.** / Consult your health care provider. Pregnant females who do not have evidence of immunity should receive the first dose after pregnancy.  Zoster vaccine.** / 1 dose for adults aged 51 years or older.  Measles, mumps, rubella (MMR) vaccine.** / You need at least 1 dose of MMR if you were born in 1957 or later. You may also need a 2nd dose. For females of childbearing age, rubella immunity should be determined. If there is no evidence of immunity, females who are not pregnant should be vaccinated. If there is no evidence of immunity, females who are pregnant should delay immunization until after pregnancy.  Pneumococcal 13-valent conjugate (PCV13) vaccine.** / Consult your health care provider.  Pneumococcal polysaccharide (PPSV23) vaccine.** / 1 to 2 doses if you smoke cigarettes or if you have certain conditions.  Meningococcal vaccine.** / Consult your health care provider.  Hepatitis A vaccine.** / Consult your health care provider.  Hepatitis B vaccine.** / Consult your health care provider.  Haemophilus influenzae type b (Hib) vaccine.** / Consult your health care provider. Ages 65 years and over  Blood pressure check.** / Every 1 to 2 years.  Lipid and  cholesterol check.** / Every 5 years beginning at age 35 years.  Lung cancer screening. / Every year if you are aged 55-80 years and have a 30-pack-year history of smoking and currently smoke or have quit within the past 15 years. Yearly screening is stopped once you have quit smoking for at least 15 years or develop a health problem that would prevent you from having lung cancer treatment.  Clinical breast exam.** / Every year after age 47 years.  BRCA-related cancer risk assessment.** / For women who have family members with a BRCA-related cancer (breast, ovarian, tubal, or peritoneal cancers).  Mammogram.** / Every year beginning at age 3 years and continuing for as long as you are in good health. Consult with your health care provider.  Pap test.** / Every 3 years starting at age 10 years through age 72 or 63 years  with 3 consecutive normal Pap tests. Testing can be stopped between 65 and 70 years with 3 consecutive normal Pap tests and no abnormal Pap or HPV tests in the past 10 years.  HPV screening.** / Every 3 years from ages 40 years through ages 10 or 8 years with a history of 3 consecutive normal Pap tests. Testing can be stopped between 65 and 70 years with 3 consecutive normal Pap tests and no abnormal Pap or HPV tests in the past 10 years.  Fecal occult blood test (FOBT) of stool. / Every year beginning at age 19 years and continuing until age 35 years. You may not need to do this test if you get a colonoscopy every 10 years.  Flexible sigmoidoscopy or colonoscopy.** / Every 5 years for a flexible sigmoidoscopy or every 10 years for a colonoscopy beginning at age 51 years and continuing until age 80 years.  Hepatitis C blood test.** / For all people born from [birth year] through 1964 and any individual with known risks for hepatitis C.  Osteoporosis screening.** / A one-time screening for women ages 59 years and over and women at risk for fractures or osteoporosis.  Skin self-exam. /  Monthly.  Influenza vaccine. / Every year.  Tetanus, diphtheria, and acellular pertussis (Tdap/Td) vaccine.** / 1 dose of Td every 10 years.  Varicella vaccine.** / Consult your health care provider.  Zoster vaccine.** / 1 dose for adults aged 51 years or older.  Pneumococcal 13-valent conjugate (PCV13) vaccine.** / Consult your health care provider.  Pneumococcal polysaccharide (PPSV23) vaccine.** / 1 dose for all adults aged 71 years and older.  Meningococcal vaccine.** / Consult your health care provider.  Hepatitis A vaccine.** / Consult your health care provider.  Hepatitis B vaccine.** / Consult your health care provider.  Haemophilus influenzae type b (Hib) vaccine.** / Consult your health care provider. ** Family history and personal history of risk and conditions may change your health care provider's recommendations. Document Released: 08/03/2001 Document Revised: 10/22/2013 Document Reviewed: 11/02/2010 [facility] Patient Information 2015 [place], [place]. This information is not intended to replace advice given to you by your health care provider. Make sure you discuss any questions you have with your health care provider.

## 2014-02-01 LAB — COMPREHENSIVE METABOLIC PANEL
A/G RATIO: 1.2 (ref 1.1–2.5)
ALK PHOS: 89 IU/L (ref 39–117)
ALT: 8 IU/L (ref 0–32)
AST: 15 IU/L (ref 0–40)
Albumin: 4.4 g/dL (ref 3.6–4.8)
BUN / CREAT RATIO: 18 (ref 11–26)
BUN: 13 mg/dL (ref 8–27)
CALCIUM: 9.6 mg/dL (ref 8.7–10.3)
CO2: 25 mmol/L (ref 18–29)
CREATININE: 0.71 mg/dL (ref 0.57–1.00)
Chloride: 97 mmol/L (ref 97–108)
GFR calc Af Amer: 103 mL/min/{1.73_m2} (ref >59)
GFR calc non Af Amer: 89 mL/min/{1.73_m2} (ref >59)
GLOBULIN, TOTAL: 3.6 g/dL (ref 1.5–4.5)
Glucose: 95 mg/dL (ref 65–99)
POTASSIUM: 4.2 mmol/L (ref 3.5–5.2)
Sodium: 139 mmol/L (ref 134–144)
Total Bilirubin: 0.4 mg/dL (ref 0.0–1.2)
Total Protein: 8 g/dL (ref 6.0–8.5)

## 2014-02-01 LAB — CBC WITH DIFFERENTIAL
Basophils Absolute: 0 10*3/uL (ref 0.0–0.2)
Basos: 0 %
EOS ABS: 0.2 10*3/uL (ref 0.0–0.4)
Eos: 2 %
HCT: 38.6 % (ref 34.0–46.6)
HEMOGLOBIN: 12.6 g/dL (ref 11.1–15.9)
IMMATURE GRANULOCYTES: 0 %
Immature Grans (Abs): 0 10*3/uL (ref 0.0–0.1)
LYMPHS: 23 %
Lymphocytes Absolute: 2 10*3/uL (ref 0.7–3.1)
MCH: 28.4 pg (ref 26.6–33.0)
MCHC: 32.6 g/dL (ref 31.5–35.7)
MCV: 87 fL (ref 79–97)
MONOS ABS: 0.6 10*3/uL (ref 0.1–0.9)
Monocytes: 7 %
Neutrophils Absolute: 6.2 10*3/uL (ref 1.4–7.0)
Neutrophils Relative %: 68 %
PLATELETS: 279 10*3/uL (ref 150–379)
RBC: 4.43 x10E6/uL (ref 3.77–5.28)
RDW: 15.5 % — ABNORMAL HIGH (ref 12.3–15.4)
WBC: 9 10*3/uL (ref 3.4–10.8)

## 2014-02-01 LAB — LIPID PANEL
CHOL/HDL RATIO: 4.5 ratio — AB (ref 0.0–4.4)
CHOLESTEROL TOTAL: 210 mg/dL — AB (ref 100–199)
HDL: 47 mg/dL (ref >39)
LDL CALC: 135 mg/dL — AB (ref 0–99)
TRIGLYCERIDES: 140 mg/dL (ref 0–149)
VLDL Cholesterol Cal: 28 mg/dL (ref 5–40)

## 2014-02-03 LAB — PAP LB (LIQUID-BASED): PAP Smear Comment: 0

## 2014-02-08 ENCOUNTER — Other Ambulatory Visit: Payer: Self-pay | Admitting: *Deleted

## 2014-02-08 DIAGNOSIS — R87629 Unspecified abnormal cytological findings in specimens from vagina: Secondary | ICD-10-CM

## 2014-06-05 ENCOUNTER — Ambulatory Visit: Payer: Medicare Other | Admitting: Internal Medicine

## 2014-06-18 ENCOUNTER — Ambulatory Visit: Payer: Medicare Other | Admitting: Internal Medicine

## 2014-07-19 ENCOUNTER — Ambulatory Visit: Payer: Medicare Other | Admitting: Internal Medicine

## 2014-07-26 ENCOUNTER — Ambulatory Visit (INDEPENDENT_AMBULATORY_CARE_PROVIDER_SITE_OTHER): Payer: Medicare Other | Admitting: Internal Medicine

## 2014-07-26 ENCOUNTER — Encounter: Payer: Self-pay | Admitting: Internal Medicine

## 2014-07-26 VITALS — BP 138/80 | HR 90 | Temp 99.4°F | Resp 20 | Ht 61.0 in | Wt 218.0 lb

## 2014-07-26 DIAGNOSIS — R87610 Atypical squamous cells of undetermined significance on cytologic smear of cervix (ASC-US): Secondary | ICD-10-CM

## 2014-07-26 DIAGNOSIS — I10 Essential (primary) hypertension: Secondary | ICD-10-CM

## 2014-07-26 DIAGNOSIS — D509 Iron deficiency anemia, unspecified: Secondary | ICD-10-CM

## 2014-07-26 DIAGNOSIS — J302 Other seasonal allergic rhinitis: Secondary | ICD-10-CM

## 2014-07-26 DIAGNOSIS — R0602 Shortness of breath: Secondary | ICD-10-CM

## 2014-07-26 DIAGNOSIS — Z23 Encounter for immunization: Secondary | ICD-10-CM

## 2014-07-26 NOTE — Progress Notes (Signed)
Patient ID: Penny Gibson, female   DOB: 11/07/1945, 69 y.o.   MRN: [id number]    Facility  [facility]    Place of Service:   OFFICE   Allergies  Allergen Reactions  . Penicillins Anaphylaxis    Chief Complaint  Patient presents with  . Medical Management of Chronic Issues    HPI:  69 yo female seen today for follow up. She reports feeling well overall. She did see GYN after her last OV and another pap done and was told that she was "okay". She never went back for 3 mos f/u. She denies vaginal bleeding or pelvic pain.   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  Needs flu and prevnar vaccine also.  No other concerns. Denies CP, palpitations. Has intermittent sinus HA and DOE. No f/c. She has sinus pressure and intermittent cough  Medications: Patient's Medications  New Prescriptions   No medications on file  Previous Medications   IRON-VITAMIN C (VITRON-C PO)    Take by mouth. Take one tablet daily   LISINOPRIL-HYDROCHLOROTHIAZIDE (PRINZIDE,ZESTORETIC) 10-12.5 MG PER TABLET    Take 1 tablet by mouth daily.  Modified Medications   No medications on file  Discontinued Medications   No medications on file     Review of Systems  As above. All other systems reviewed are negative  Filed Vitals:   07/26/14 0906  BP: 138/80  Pulse: 90  Temp: 99.4 F (37.4 C)  TempSrc: Oral  Resp: 20  Height: 5\' 1"  (1.549 m)  Weight: 218 lb (98.884 kg)  SpO2: 93%   Body mass index is 41.21 kg/(m^2).  Physical Exam CONSTITUTIONAL: Looks well in NAD. Awake, alert and oriented x 3 HEENT: PERRLA. L>R maxillary sinus TTP with boggy tissue texture changes. Oropharynx cobblestoning with redness but no exudate NECK: Supple. Nontender. No palpable cervical or supraclavicular lymph nodes. No carotid bruit b/l.  CVS: Regular rate without murmur, gallop or rub. LUNGS: CTA b/l no wheezing, rales or rhonchi. ABDOMEN: Bowel sounds present x 4. Soft, nontender,  nondistended. No palpable mass or bruit EXTREMITIES: No edema b/l. Distal pulses palpable. No calf tenderness PSYCH: Affect, behavior and mood normal   Labs reviewed: No visits with results within 3 Month(s) from this visit. Latest known visit with results is:  Office Visit on 01/31/2014  Component Date Value Ref Range Status  . DIAGNOSIS: 01/31/2014 Comment*  Final   Comment: EPITHELIAL CELL ABNORMALITY.                          ATYPICAL SQUAMOUS CELLS OF UNDETERMINED SIGNIFICANCE.  Marland Kitchen Specimen adequacy: 01/31/2014 Comment   Final   Comment: Satisfactory for evaluation. Endocervical and/or squamous metaplastic                          cells (endocervical component) are present.  Marland Kitchen PATH REPORT.FINAL DX [facility] 01/31/2014 Comment   Final   V76.2 ; Screening for malignant neoplasm of the cervix  . Performed by: 01/31/2014 Comment   Final   Tiffany Vercher, Cytotechnologist (ASCP)  . Electronically signed by: 01/31/2014 Comment   Final   Galvin Proffer, MD, Pathologist  . PAP SMEAR COMMENT 01/31/2014 .   Final  . Pathologist provided ICD9: 01/31/2014 Comment   Final   795.01  . Note: 01/31/2014 Comment   Final   Comment: The Pap smear is a screening test designed to aid in  the detection of                          premalignant and malignant conditions of the uterine cervix.  It is not a                          diagnostic procedure and should not be used as the sole means of detecting                          cervical cancer.  Both false-positive and false-negative reports do occur.  . Cholesterol, Total 01/31/2014 210* 100 - 199 mg/dL Final  . Triglycerides 01/31/2014 140  0 - 149 mg/dL Final  . HDL 01/31/2014 47  >39 mg/dL Final   Comment: According to ATP-III Guidelines, HDL-C >59 mg/dL is considered a                          negative risk factor for CHD.  Marland Kitchen VLDL Cholesterol Cal 01/31/2014 28  5 - 40 mg/dL Final  . LDL Calculated 01/31/2014 135* 0 - 99 mg/dL Final  . Chol/HDL Ratio  01/31/2014 4.5* 0.0 - 4.4 ratio units Final   Comment:                                   T. Chol/HDL Ratio                                                                      Men  Women                                                        1/2 Avg.Risk  3.4    3.3                                                            Avg.Risk  5.0    4.4                                                         2X Avg.Risk  9.6    7.1                                                         3X Avg.Risk 23.4   11.0  . Glucose 01/31/2014 95  65 -  99 mg/dL Final  . BUN 01/31/2014 13  8 - 27 mg/dL Final  . Creatinine, Ser 01/31/2014 0.71  0.57 - 1.00 mg/dL Final  . GFR calc non Af Amer 01/31/2014 89  >59 mL/min/1.73 Final  . GFR calc Af Amer 01/31/2014 103  >59 mL/min/1.73 Final  . BUN/Creatinine Ratio 01/31/2014 18  11 - 26 Final  . Sodium 01/31/2014 139  134 - 144 mmol/L Final  . Potassium 01/31/2014 4.2  3.5 - 5.2 mmol/L Final  . Chloride 01/31/2014 97  97 - 108 mmol/L Final  . CO2 01/31/2014 25  18 - 29 mmol/L Final  . Calcium 01/31/2014 9.6  8.7 - 10.3 mg/dL Final  . Total Protein 01/31/2014 8.0  6.0 - 8.5 g/dL Final  . Albumin 01/31/2014 4.4  3.6 - 4.8 g/dL Final  . Globulin, Total 01/31/2014 3.6  1.5 - 4.5 g/dL Final  . Albumin/Globulin Ratio 01/31/2014 1.2  1.1 - 2.5 Final  . Total Bilirubin 01/31/2014 0.4  0.0 - 1.2 mg/dL Final  . Alkaline Phosphatase 01/31/2014 89  39 - 117 IU/L Final  . AST 01/31/2014 15  0 - 40 IU/L Final  . ALT 01/31/2014 8  0 - 32 IU/L Final  . WBC 01/31/2014 9.0  3.4 - 10.8 x10E3/uL Final  . RBC 01/31/2014 4.43  3.77 - 5.28 x10E6/uL Final  . Hemoglobin 01/31/2014 12.6  11.1 - 15.9 g/dL Final  . HCT 01/31/2014 38.6  34.0 - 46.6 % Final  . MCV 01/31/2014 87  79 - 97 fL Final  . MCH 01/31/2014 28.4  26.6 - 33.0 pg Final  . MCHC 01/31/2014 32.6  31.5 - 35.7 g/dL Final  . RDW 01/31/2014 15.5* 12.3 - 15.4 % Final  . Platelets 01/31/2014 279  150 - 379 x10E3/uL Final  .  Neutrophils Relative % 01/31/2014 68   Final  . Lymphs 01/31/2014 23   Final  . Monocytes 01/31/2014 7   Final  . Eos 01/31/2014 2   Final  . Basos 01/31/2014 0   Final  . Neutrophils Absolute 01/31/2014 6.2  1.4 - 7.0 x10E3/uL Final  . Lymphocytes Absolute 01/31/2014 2.0  0.7 - 3.1 x10E3/uL Final  . Monocytes Absolute 01/31/2014 0.6  0.1 - 0.9 x10E3/uL Final  . Eosinophils Absolute 01/31/2014 0.2  0.0 - 0.4 x10E3/uL Final  . Basophils Absolute 01/31/2014 0.0  0.0 - 0.2 x10E3/uL Final  . Immature Granulocytes 01/31/2014 0   Final  . Immature Grans (Abs) 01/31/2014 0.0  0.0 - 0.1 x10E3/uL Final   Chart reviewed  Assessment/Plan   ICD-9-CM ICD-10-CM   1. Atypical squamous cells of undetermined significance (ASCUS) on Papanicolaou smear of cervix  795.01 R87.610   2. SOB (shortness of breath) - chronic and probably related to obesity 786.05 R06.02   3. Essential hypertension - stable 401.9 S28 Basic Metabolic Panel  4. Severe obesity (BMI >= 40) 278.01 E66.01   5. Other seasonal allergic rhinitis 477.8 J30.2   6. Anemia, iron deficiency 280.9 D50.9 CBC with Differential     Iron and TIBC     Ferritin  7. Flu vaccine need V04.81 Z23 Flu Vaccine QUAD 36+ mos PF IM (Fluarix Quad PF)  8. Need for vaccination with 13-polyvalent pneumococcal conjugate vaccine V03.82 Z23 Pneumococcal conjugate vaccine 13-valent   --Use saline nasal spray as needed to keep nose moist.  --Recommend OTC plain allegra, claritin or zyrtec daily for seasonal allergy  --Push fluids and rest  --F/u with Urgent care for TB skin test  --  continue with medications as ordered  --f/u with Sherrie Mustache in 6 mos for CPE/pap   Mammie Meras S. Perlie Gold  [facility] [address] [place], [place] [address] [phone] Office (Wednesdays and Fridays 8 AM - 5 PM) [phone] Cell (Monday-Friday 8 AM - 5 PM)

## 2014-07-26 NOTE — Patient Instructions (Addendum)
Use saline nasal spray as needed to keep nose moist.  Recommend OTC plain allegra, claritin or zyrtec daily for seasonal allergy  Push fluids and rest  F/u with Urgent care for TB skin test

## 2014-08-03 ENCOUNTER — Encounter: Payer: Self-pay | Admitting: Family Medicine

## 2014-09-20 ENCOUNTER — Encounter: Payer: Self-pay | Admitting: Nurse Practitioner

## 2015-01-21 ENCOUNTER — Other Ambulatory Visit: Payer: Self-pay

## 2015-01-21 DIAGNOSIS — Z1231 Encounter for screening mammogram for malignant neoplasm of breast: Secondary | ICD-10-CM

## 2015-01-23 ENCOUNTER — Encounter: Payer: Medicare Other | Admitting: Nurse Practitioner

## 2015-01-30 ENCOUNTER — Encounter: Payer: Medicare Other | Admitting: Nurse Practitioner

## 2015-02-05 ENCOUNTER — Ambulatory Visit
Admission: RE | Admit: 2015-02-05 | Discharge: 2015-02-05 | Disposition: A | Discharge status: Home / Self Care | Payer: Medicare Other | Source: Ambulatory Visit

## 2015-02-05 DIAGNOSIS — Z1231 Encounter for screening mammogram for malignant neoplasm of breast: Secondary | ICD-10-CM

## 2015-02-06 ENCOUNTER — Ambulatory Visit: Payer: Medicare Other

## 2015-02-07 ENCOUNTER — Ambulatory Visit: Payer: Medicare Other

## 2015-04-03 ENCOUNTER — Emergency Department (HOSPITAL_COMMUNITY)
Admission: EM | Admit: 2015-04-03 | Discharge: 2015-04-03 | Disposition: A | Discharge status: Home / Self Care | Payer: Medicare Other | Attending: Emergency Medicine | Admitting: Emergency Medicine

## 2015-04-03 ENCOUNTER — Encounter (HOSPITAL_COMMUNITY): Payer: Self-pay | Admitting: Emergency Medicine

## 2015-04-03 DIAGNOSIS — I10 Essential (primary) hypertension: Secondary | ICD-10-CM | POA: Diagnosis not present

## 2015-04-03 DIAGNOSIS — D649 Anemia, unspecified: Secondary | ICD-10-CM | POA: Diagnosis not present

## 2015-04-03 DIAGNOSIS — Z880 Allergy status to penicillin: Secondary | ICD-10-CM | POA: Insufficient documentation

## 2015-04-03 DIAGNOSIS — Z79899 Other long term (current) drug therapy: Secondary | ICD-10-CM | POA: Insufficient documentation

## 2015-04-03 DIAGNOSIS — M791 Myalgia: Secondary | ICD-10-CM | POA: Diagnosis present

## 2015-04-03 DIAGNOSIS — M62838 Other muscle spasm: Secondary | ICD-10-CM

## 2015-04-03 DIAGNOSIS — M542 Cervicalgia: Secondary | ICD-10-CM | POA: Diagnosis not present

## 2015-04-03 DIAGNOSIS — M549 Dorsalgia, unspecified: Secondary | ICD-10-CM | POA: Diagnosis not present

## 2015-04-03 MED ORDER — HYDROCODONE-ACETAMINOPHEN 5-325 MG PO TABS
1.0000 | ORAL_TABLET | Freq: Once | ORAL | Status: AC
  Administered 2015-04-03: 1 via ORAL
  Filled 2015-04-03: qty 1

## 2015-04-03 MED ORDER — LIDOCAINE 5 % EX PTCH: 1.0000 | MEDICATED_PATCH | CUTANEOUS | Status: DC

## 2015-04-03 MED ORDER — ONDANSETRON 4 MG PO TBDP
4.0000 mg | ORAL_TABLET | Freq: Once | ORAL | Status: AC
  Administered 2015-04-03: 4 mg via ORAL
  Filled 2015-04-03: qty 1

## 2015-04-03 MED ORDER — TRAMADOL HCL 50 MG PO TABS: 50.0000 mg | ORAL_TABLET | Freq: Four times a day (QID) | ORAL | Status: DC | PRN

## 2015-04-03 MED ORDER — NAPROXEN 375 MG PO TABS: 375.0000 mg | ORAL_TABLET | Freq: Two times a day (BID) | ORAL | Status: DC

## 2015-04-03 MED ORDER — CYCLOBENZAPRINE HCL 10 MG PO TABS: 10.0000 mg | ORAL_TABLET | Freq: Two times a day (BID) | ORAL | Status: DC | PRN

## 2015-04-03 NOTE — ED Notes (Signed)
Per pt, states she has been having right arm radiating to right neck and back pain for 3 days-no trauma

## 2015-04-03 NOTE — Discharge Instructions (Signed)
Muscle Cramps and Spasms Muscle cramps and spasms occur when a muscle or muscles tighten and you have no control over this tightening (involuntary muscle contraction). They are a common problem and can develop in any muscle. The most common place is in the calf muscles of the leg. Both muscle cramps and muscle spasms are involuntary muscle contractions, but they also have differences:   Muscle cramps are sporadic and painful. They may last a few seconds to a quarter of an hour. Muscle cramps are often more forceful and last longer than muscle spasms.  Muscle spasms may or may not be painful. They may also last just a few seconds or much longer. CAUSES  It is uncommon for cramps or spasms to be due to a serious underlying problem. In many cases, the cause of cramps or spasms is unknown. Some common causes are:   Overexertion.   Overuse from repetitive motions (doing the same thing over and over).   Remaining in a certain position for a long period of time.   Improper preparation, form, or technique while performing a sport or activity.   Dehydration.   Injury.   Side effects of some medicines.   Abnormally low levels of the salts and ions in your blood (electrolytes), especially potassium and calcium. This could happen if you are taking water pills (diuretics) or you are pregnant.  Some underlying medical problems can make it more likely to develop cramps or spasms. These include, but are not limited to:   Diabetes.   Parkinson disease.   Hormone disorders, such as thyroid problems.   Alcohol abuse.   Diseases specific to muscles, joints, and bones.   Blood vessel disease where not enough blood is getting to the muscles.  HOME CARE INSTRUCTIONS   Stay well hydrated. Drink enough water and fluids to keep your urine clear or pale yellow.  It may be helpful to massage, stretch, and relax the affected muscle.  For tight or tense muscles, use a warm towel, heating  pad, or hot shower water directed to the affected area.  If you are sore or have pain after a cramp or spasm, applying ice to the affected area may relieve discomfort.  Put ice in a plastic bag.  Place a towel between your skin and the bag.  Leave the ice on for 15-20 minutes, 03-04 times a day.  Medicines used to treat a known cause of cramps or spasms may help reduce their frequency or severity. Only take over-the-counter or prescription medicines as directed by your caregiver. SEEK MEDICAL CARE IF:  Your cramps or spasms get more severe, more frequent, or do not improve over time.  MAKE SURE YOU:   Understand these instructions.  Will watch your condition.  Will get help right away if you are not doing well or get worse.   This information is not intended to replace advice given to you by your health care provider. Make sure you discuss any questions you have with your health care provider.   Document Released: 11/27/2001 Document Revised: 10/02/2012 Document Reviewed: 05/24/2012 Elsevier Interactive Patient Education 2016 [employer]. Acute Torticollis Torticollis is a condition in which the muscles of the neck tighten (contract) abnormally, causing the neck to twist and the head to move into an unnatural position. Torticollis that develops suddenly is called acute torticollis. If torticollis becomes chronic and is left untreated, the face and neck can become deformed. CAUSES This condition may be caused by:  Sleeping in an awkward  position (common).  Extending or twisting the neck muscles beyond their normal position.  Infection. In some cases, the cause may not be known. SYMPTOMS Symptoms of this condition include:  An unnatural position of the head.  Neck pain.  A limited ability to move the neck.  Twisting of the neck to one side. DIAGNOSIS This condition is diagnosed with a physical exam. You may also have imaging tests, such as an X-ray, CT scan, or  MRI. TREATMENT Treatment for this condition involves trying to relax the neck muscles. It may include:  Medicines or shots.  Physical therapy.  Surgery. This may be done in severe cases. HOME CARE INSTRUCTIONS  Take medicines only as directed by your health care provider.  Do stretching exercises and massage your neck as directed by your health care provider.  Keep all follow-up visits as directed by your health care provider. This is important. SEEK MEDICAL CARE IF:  You develop a fever. SEEK IMMEDIATE MEDICAL CARE IF:  You develop difficulty breathing.  You develop noisy breathing (stridor).  You start drooling.  You have trouble swallowing or have pain with swallowing.  You develop numbness or weakness in your hands or feet.  You have changes in your speech, understanding, or vision.  Your pain gets worse.   This information is not intended to replace advice given to you by your health care provider. Make sure you discuss any questions you have with your health care provider.   Document Released: 06/04/2000 Document Revised: 10/22/2014 Document Reviewed: 06/03/2014 Elsevier Interactive Patient Education [employer].

## 2015-04-03 NOTE — ED Provider Notes (Signed)
CSN: [id number]     Arrival date & time 04/03/15  2831 History   First MD Initiated Contact with Patient 04/03/15 1012     Chief Complaint  Patient presents with  . Generalized Body Aches     (Consider location/radiation/quality/duration/timing/severity/associated sxs/prior Treatment) HPI  69 year old female with a past medical history of anemia, hypertension, arthritis, and morbid obesity. Patient presents with chief complaint of right shoulder and neck pain. Patient states that pain began about 3 days ago. She describes the pain as aching, worse with movement of the neck and shoulder. She denies any known injury. She has been applying heat and ice and taking Tylenol without relief. Patient states it is difficult for her to move her arm to put on her bra. She denies pain in the shoulder joint, chest pain, shortness of breath or cough.  Past Medical History  Diagnosis Date  . Anemia   . Hypertension   . Arthritis    Past Surgical History  Procedure Laterality Date  . Breast lumpectomy Left 2010  . Tubal ligation     Family History  Problem Relation Age of Onset  . Heart attack Mother   . Heart disease Mother   . Cancer Sister   . Stroke Maternal Grandmother    Social History  Substance Use Topics  . Smoking status: Never Smoker   . Smokeless tobacco: Never Used  . Alcohol Use: No   OB History    No data available     Review of Systems  Constitutional: Negative for fever.  Respiratory: Negative for cough and shortness of breath.   Cardiovascular: Negative for chest pain.  Musculoskeletal: Positive for back pain and neck pain. Negative for joint swelling and neck stiffness.  Skin: Negative for rash.      Allergies  Penicillins  Home Medications   Prior to Admission medications   Medication Sig Start Date End Date Taking? Authorizing Provider  cyclobenzaprine (FLEXERIL) 10 MG tablet Take 1 tablet (10 mg total) by mouth 2 (two) times daily as needed for muscle  spasms. 04/03/15   Charl Wellen, PA-C  Iron-Vitamin C (VITRON-C PO) Take by mouth. Take one tablet daily    Historical Provider, MD  lidocaine (LIDODERM) 5 % Place 1 patch onto the skin daily. Remove & Discard patch within 12 hours or as directed by MD 04/03/15   Margarita Mail, PA-C  lisinopril-hydrochlorothiazide (PRINZIDE,ZESTORETIC) 10-12.5 MG per tablet Take 1 tablet by mouth daily. 11/26/13   Tiffany L Reed, DO  naproxen (NAPROSYN) 375 MG tablet Take 1 tablet (375 mg total) by mouth 2 (two) times daily. 04/03/15   Margarita Mail, PA-C  traMADol (ULTRAM) 50 MG tablet Take 1 tablet (50 mg total) by mouth every 6 (six) hours as needed. 04/03/15   Acel Natzke, PA-C   BP 158/84 mmHg  Pulse 89  Temp(Src) 98.1 F (36.7 C) (Oral)  Resp 16  SpO2 97% Physical Exam  Constitutional: She is oriented to person, place, and time. She appears well-developed and well-nourished. No distress.  HENT:  Head: Normocephalic and atraumatic.  Eyes: Conjunctivae are normal. No scleral icterus.  Neck: Normal range of motion.  Cardiovascular: Normal rate, regular rhythm and normal heart sounds.  Exam reveals no gallop and no friction rub.   No murmur heard. Pulmonary/Chest: Effort normal and breath sounds normal. No respiratory distress.  Abdominal: Soft. Bowel sounds are normal. She exhibits no distension and no mass. There is no tenderness. There is no guarding.  Musculoskeletal:  Heavy, pendulous  breasts that sit at the waistline  Thick, dense, painful muscles esp R side with acute spasm. TTP . Pain reproducible. She has pain with all ROM of neck worse with extension right rotation and R lateral flexion.  Neurological: She is alert and oriented to person, place, and time.  Skin: Skin is warm and dry. She is not diaphoretic.    ED Course  Procedures (including critical care time) Labs Review Labs Reviewed - No data to display  Imaging Review No results found. I have personally reviewed and  evaluated these images and lab results as part of my medical decision-making.   EKG Interpretation None      MDM   Final diagnoses:  Muscle spasm of right shoulder    Patient with acute muscle spasm likely due to overworked shoulders and heavy breasts.  i have a referral to sports medicine, osteopathic physician for eval  And treat,. She may self refer to [place] therapist. I doubt any emrergent cause of her pain such as dissection,PE, herpetic erruption. ACS     Margarita Mail, PA-C 04/03/15 [place], MD 04/03/15 1118

## 2015-07-11 DIAGNOSIS — Z1211 Encounter for screening for malignant neoplasm of colon: Secondary | ICD-10-CM | POA: Diagnosis not present

## 2015-07-11 DIAGNOSIS — I10 Essential (primary) hypertension: Secondary | ICD-10-CM | POA: Diagnosis not present

## 2015-07-11 DIAGNOSIS — M179 Osteoarthritis of knee, unspecified: Secondary | ICD-10-CM | POA: Diagnosis not present

## 2015-08-06 ENCOUNTER — Telehealth: Payer: Self-pay | Admitting: Internal Medicine

## 2015-08-06 NOTE — Telephone Encounter (Signed)
[place] letter mailed...[facility]

## 2015-09-15 ENCOUNTER — Encounter (HOSPITAL_COMMUNITY): Payer: Self-pay | Admitting: Emergency Medicine

## 2015-09-15 ENCOUNTER — Emergency Department (INDEPENDENT_AMBULATORY_CARE_PROVIDER_SITE_OTHER)
Admission: EM | Admit: 2015-09-15 | Discharge: 2015-09-15 | Disposition: A | Discharge status: Home / Self Care | Payer: Medicare Other | Source: Home / Self Care | Attending: Family Medicine | Admitting: Family Medicine

## 2015-09-15 DIAGNOSIS — J9801 Acute bronchospasm: Secondary | ICD-10-CM

## 2015-09-15 MED ORDER — ALBUTEROL SULFATE (2.5 MG/3ML) 0.083% IN NEBU
2.5000 mg | INHALATION_SOLUTION | Freq: Once | RESPIRATORY_TRACT | Status: AC
  Administered 2015-09-15: 2.5 mg via RESPIRATORY_TRACT

## 2015-09-15 MED ORDER — METHYLPREDNISOLONE ACETATE 80 MG/ML IJ SUSP
80.0000 mg | Freq: Once | INTRAMUSCULAR | Status: AC
  Administered 2015-09-15: 80 mg via INTRAMUSCULAR

## 2015-09-15 MED ORDER — PREDNISONE 20 MG PO TABS: ORAL_TABLET | ORAL | Status: DC

## 2015-09-15 MED ORDER — ALBUTEROL SULFATE (2.5 MG/3ML) 0.083% IN NEBU
INHALATION_SOLUTION | RESPIRATORY_TRACT | Status: AC
  Filled 2015-09-15: qty 3

## 2015-09-15 MED ORDER — IPRATROPIUM-ALBUTEROL 0.5-2.5 (3) MG/3ML IN SOLN
RESPIRATORY_TRACT | Status: AC
  Filled 2015-09-15: qty 3

## 2015-09-15 MED ORDER — IPRATROPIUM-ALBUTEROL 0.5-2.5 (3) MG/3ML IN SOLN
3.0000 mL | Freq: Once | RESPIRATORY_TRACT | Status: AC
  Administered 2015-09-15: 3 mL via RESPIRATORY_TRACT

## 2015-09-15 MED ORDER — ALBUTEROL SULFATE HFA 108 (90 BASE) MCG/ACT IN AERS: 2.0000 | INHALATION_SPRAY | RESPIRATORY_TRACT | Status: DC | PRN

## 2015-09-15 MED ORDER — METHYLPREDNISOLONE ACETATE 80 MG/ML IJ SUSP
INTRAMUSCULAR | Status: AC
  Filled 2015-09-15: qty 1

## 2015-09-15 NOTE — Discharge Instructions (Signed)
Bronchospasm, Adult Start taking the prednisone as directed tomorrow. Take with food Use the albuterol inhaler 2 puffs every 4 hours for cough and wheeze Recommend taking a nonsedating antihistamine such as Claritin, Zyrtec or Allegra. If he started getting worse, have trouble breathing, shortness of breath, fever and/or worsening cough seek medical attention promptly and recommended you go to the emergency department. Otherwise, call your doctor for an appointment tomorrow and see her this week. A bronchospasm is a spasm or tightening of the airways going into the lungs. During a bronchospasm breathing becomes more difficult because the airways get smaller. When this happens there can be coughing, a whistling sound when breathing (wheezing), and difficulty breathing. Bronchospasm is often associated with asthma, but not all patients who experience a bronchospasm have asthma. CAUSES  A bronchospasm is caused by inflammation or irritation of the airways. The inflammation or irritation may be triggered by:   Allergies (such as to animals, pollen, food, or mold). Allergens that cause bronchospasm may cause wheezing immediately after exposure or many hours later.   Infection. Viral infections are believed to be the most common cause of bronchospasm.   Exercise.   Irritants (such as pollution, cigarette smoke, strong odors, aerosol sprays, and paint fumes).   Weather changes. Winds increase molds and pollens in the air. Rain refreshes the air by washing irritants out. Cold air may cause inflammation.   Stress and emotional upset.  SIGNS AND SYMPTOMS   Wheezing.   Excessive nighttime coughing.   Frequent or severe coughing with a simple cold.   Chest tightness.   Shortness of breath.  DIAGNOSIS  Bronchospasm is usually diagnosed through a history and physical exam. Tests, such as chest X-rays, are sometimes done to look for other conditions. TREATMENT   Inhaled medicines can  be given to open up your airways and help you breathe. The medicines can be given using either an inhaler or a nebulizer machine.  Corticosteroid medicines may be given for severe bronchospasm, usually when it is associated with asthma. HOME CARE INSTRUCTIONS   Always have a plan prepared for seeking medical care. Know when to call your health care provider and local emergency services (911 in the [place] Know where you can access local emergency care.  Only take medicines as directed by your health care provider.  If you were prescribed an inhaler or nebulizer machine, ask your health care provider to explain how to use it correctly. Always use a spacer with your inhaler if you were given one.  It is necessary to remain calm during an attack. Try to relax and breathe more slowly.  Control your home environment in the following ways:   Change your heating and air conditioning filter at least once a month.   Limit your use of fireplaces and wood stoves.  Do not smoke and do not allow smoking in your home.   Avoid exposure to perfumes and fragrances.   Get rid of pests (such as roaches and mice) and their droppings.   Throw away plants if you see mold on them.   Keep your house clean and dust free.   Replace carpet with wood, tile, or vinyl flooring. Carpet can trap dander and dust.   Use allergy-proof pillows, mattress covers, and box spring covers.   Wash bed sheets and blankets every week in hot water and dry them in a dryer.   Use blankets that are made of polyester or cotton.   Wash hands frequently. SEEK MEDICAL CARE IF:  You have muscle aches.   You have chest pain.   The sputum changes from clear or white to yellow, green, gray, or bloody.   The sputum you cough up gets thicker.   There are problems that may be related to the medicine you are given, such as a rash, itching, swelling, or trouble breathing.  SEEK IMMEDIATE MEDICAL CARE IF:   You  have worsening wheezing and coughing even after taking your prescribed medicines.   You have increased difficulty breathing.   You develop severe chest pain. MAKE SURE YOU:   Understand these instructions.  Will watch your condition.  Will get help right away if you are not doing well or get worse.   This information is not intended to replace advice given to you by your health care provider. Make sure you discuss any questions you have with your health care provider.   Document Released: 06/10/2003 Document Revised: 06/28/2014 Document Reviewed: 11/27/2012 Elsevier Interactive Patient Education [employer].

## 2015-09-15 NOTE — ED Provider Notes (Signed)
CSN: [id number]     Arrival date & time 09/15/15  1318 History   First MD Initiated Contact with Patient 09/15/15 1610     Chief Complaint  Patient presents with  . URI   (Consider location/radiation/quality/duration/timing/severity/associated sxs/prior Treatment) HPI Comments: 70 year old female complaining of chest congestion and cough. Denies fever, PND, nasal congestion or sore throat. She does have soreness shortness of breath. She denies history of smoking or asthma.   Past Medical History  Diagnosis Date  . Anemia   . Hypertension   . Arthritis    Past Surgical History  Procedure Laterality Date  . Breast lumpectomy Left 2010  . Tubal ligation     Family History  Problem Relation Age of Onset  . Heart attack Mother   . Heart disease Mother   . Cancer Sister   . Stroke Maternal Grandmother    Social History  Substance Use Topics  . Smoking status: Never Smoker   . Smokeless tobacco: Never Used  . Alcohol Use: No   OB History    No data available     Review of Systems  Constitutional: Positive for activity change. Negative for fever and fatigue.  HENT: Negative.   Eyes: Negative.   Respiratory: Positive for cough and shortness of breath. Negative for choking.   Cardiovascular: Negative.   Skin: Negative.   Neurological: Negative.   All other systems reviewed and are negative.   Allergies  Penicillins  Home Medications   Prior to Admission medications   Medication Sig Start Date End Date Taking? Authorizing Provider  acetaminophen (TYLENOL) 500 MG tablet Take 500 mg by mouth every 6 (six) hours as needed.   Yes Historical Provider, MD  Iron-Vitamin C (VITRON-C PO) Take by mouth. Take one tablet daily   Yes Historical Provider, MD  lisinopril-hydrochlorothiazide (PRINZIDE,ZESTORETIC) 10-12.5 MG per tablet Take 1 tablet by mouth daily. 11/26/13  Yes Tiffany L Reed, DO  albuterol (PROVENTIL HFA;VENTOLIN HFA) 108 (90 Base) MCG/ACT inhaler Inhale 2 puffs into  the lungs every 4 (four) hours as needed for wheezing or shortness of breath. 09/15/15   Janne Napoleon, NP  predniSONE (DELTASONE) 20 MG tablet 3 Tabs PO Days 1-3, then 2 tabs PO Days 4-6, then 1 tab PO Day 7-9, then Half Tab PO Day 10-12. Take with food. 09/15/15   Janne Napoleon, NP   Meds Ordered and Administered this Visit   Medications  ipratropium-albuterol (DUONEB) 0.5-2.5 (3) MG/3ML nebulizer solution 3 mL (3 mLs Nebulization Given 09/15/15 1637)  albuterol (PROVENTIL) (2.5 MG/3ML) 0.083% nebulizer solution 2.5 mg (2.5 mg Nebulization Given 09/15/15 1638)  methylPREDNISolone acetate (DEPO-MEDROL) injection 80 mg (80 mg Intramuscular Given 09/15/15 1635)  albuterol (PROVENTIL) (2.5 MG/3ML) 0.083% nebulizer solution 2.5 mg (2.5 mg Nebulization Given 09/15/15 1713)    BP 134/81 mmHg  Pulse 78  Temp(Src) 99.1 F (37.3 C) (Oral)  Resp 16  SpO2 96% No data found.   Physical Exam  Constitutional: She appears well-developed and well-nourished. No distress.  HENT:  Mouth/Throat: No oropharyngeal exudate.  Bilateral TMs with minor retraction. Oropharynx with minor erythema and mild clear PND.  Eyes: Conjunctivae and EOM are normal.  Neck: Normal range of motion. Neck supple.  Cardiovascular: Normal rate, regular rhythm and normal heart sounds.   Pulmonary/Chest: Effort normal. She has wheezes. She has no rales.  Forced expiration with diffuse bilateral wheezing. Forced cough produces louder wheezing and coarseness bilaterally.  Lymphadenopathy:    She has no cervical adenopathy.  Neurological: She is  alert. No cranial nerve deficit. She exhibits normal muscle tone.  Skin: Skin is warm and dry.  Nursing note and vitals reviewed.   ED Course  Procedures (including critical care time)  Labs Review Labs Reviewed - No data to display  Imaging Review No results found.   Visual Acuity Review  Right Eye Distance:   Left Eye Distance:   Bilateral Distance:    Right Eye Near:   Left  Eye Near:    Bilateral Near:         MDM   1. Cough due to bronchospasm     Post DuoNeb 2.5/5 mg the patient states she does not feel much different. Auscultation reveals persistent wheezing in all lung fields. Air movement has improved modestly. We will repeat an albuterol nebulizer 0.25 mg 15 minutes after ending the first which will be approximately 1715 hrs. After the second albuterol nebulizers treatment there is improvement with air movement and decrease and wheezes. He wheezes have not been abolished. She still has a course cough. At rest she does not have labored breathing. She is not having cough spasms.Since the patient continues to have some wheezing she was given the option of using the albuterol HFA at-home intake prednisone and see how she does in the next 24 hours. He second option was to be transferred to the emergency department if she felt like she was not ready to go home. She states that since she is feeling a little better that she would rather go home and take her medication. She will be given a work note for the next 2 days. And as written below she is instructed to go to the emergency department or call EMS if having increased difficulty in breathing, shortness of breath, fever or any other worsening. Start taking the prednisone as directed tomorrow. Take with food Use the albuterol inhaler 2 puffs every 4 hours for cough and wheeze Recommend taking a nonsedating antihistamine such as Claritin, Zyrtec or Allegra. If he started getting worse, have trouble breathing, shortness of breath, fever and/or worsening cough seek medical attention promptly and recommended you go to the emergency department. Otherwise, call your doctor for an appointment tomorrow and see her this week. Meds ordered this encounter  Medications  . acetaminophen (TYLENOL) 500 MG tablet    Sig: Take 500 mg by mouth every 6 (six) hours as needed.  Marland Kitchen ipratropium-albuterol (DUONEB) 0.5-2.5 (3) MG/3ML  nebulizer solution 3 mL    Sig:   . albuterol (PROVENTIL) (2.5 MG/3ML) 0.083% nebulizer solution 2.5 mg    Sig:   . methylPREDNISolone acetate (DEPO-MEDROL) injection 80 mg    Sig:   . albuterol (PROVENTIL) (2.5 MG/3ML) 0.083% nebulizer solution 2.5 mg    Sig:   . albuterol (PROVENTIL HFA;VENTOLIN HFA) 108 (90 Base) MCG/ACT inhaler    Sig: Inhale 2 puffs into the lungs every 4 (four) hours as needed for wheezing or shortness of breath.    Dispense:  1 Inhaler    Refill:  0    Order Specific Question:  Supervising Provider    Answer:  Ihor Gully D [id number]  . predniSONE (DELTASONE) 20 MG tablet    Sig: 3 Tabs PO Days 1-3, then 2 tabs PO Days 4-6, then 1 tab PO Day 7-9, then Half Tab PO Day 10-12. Take with food.    Dispense:  20 tablet    Refill:  0    Order Specific Question:  Supervising Provider    Answer:  Ihor Gully  D [[id number]]       Janne Napoleon, NP 09/15/15 1740

## 2015-09-15 NOTE — ED Notes (Signed)
Pt has been suffering from nasal congestion, ear fullness, and a cough for over a week.  Pt denies any fever.  She tried Mucinex with little relief, but stopped taking it due to her HBP and it made her feet and ankles swell.

## 2015-10-10 DIAGNOSIS — J302 Other seasonal allergic rhinitis: Secondary | ICD-10-CM | POA: Diagnosis not present

## 2015-10-10 DIAGNOSIS — I10 Essential (primary) hypertension: Secondary | ICD-10-CM | POA: Diagnosis not present

## 2015-10-10 DIAGNOSIS — R062 Wheezing: Secondary | ICD-10-CM | POA: Diagnosis not present

## 2015-10-13 DIAGNOSIS — J209 Acute bronchitis, unspecified: Secondary | ICD-10-CM | POA: Diagnosis not present

## 2015-11-20 ENCOUNTER — Other Ambulatory Visit: Payer: Self-pay | Admitting: Emergency Medicine

## 2015-11-25 ENCOUNTER — Other Ambulatory Visit: Payer: Self-pay | Admitting: Emergency Medicine

## 2015-11-25 NOTE — Telephone Encounter (Signed)
Received refill request from [place] for Hydrochlorothiazide and Lisin

## 2015-11-25 NOTE — Telephone Encounter (Signed)
Spoke with pt, pt states she has enough medication to last until her establish care visit.

## 2015-12-15 ENCOUNTER — Ambulatory Visit: Payer: Medicare Other | Admitting: Internal Medicine

## 2015-12-16 ENCOUNTER — Ambulatory Visit: Payer: Medicare Other | Admitting: Internal Medicine

## 2016-01-15 ENCOUNTER — Telehealth: Payer: Self-pay | Admitting: Family Medicine

## 2016-01-15 ENCOUNTER — Ambulatory Visit: Payer: Medicare Other | Admitting: Family Medicine

## 2016-01-15 ENCOUNTER — Encounter: Payer: Self-pay | Admitting: Family Medicine

## 2016-01-15 ENCOUNTER — Ambulatory Visit (INDEPENDENT_AMBULATORY_CARE_PROVIDER_SITE_OTHER): Payer: Medicare Other | Admitting: Family Medicine

## 2016-01-15 VITALS — BP 133/90 | HR 88 | Temp 98.2°F | Ht 61.0 in | Wt 230.2 lb

## 2016-01-15 DIAGNOSIS — H9193 Unspecified hearing loss, bilateral: Secondary | ICD-10-CM | POA: Diagnosis not present

## 2016-01-15 DIAGNOSIS — I10 Essential (primary) hypertension: Secondary | ICD-10-CM | POA: Diagnosis not present

## 2016-01-15 DIAGNOSIS — M159 Polyosteoarthritis, unspecified: Secondary | ICD-10-CM | POA: Diagnosis not present

## 2016-01-15 DIAGNOSIS — J302 Other seasonal allergic rhinitis: Secondary | ICD-10-CM | POA: Diagnosis not present

## 2016-01-15 DIAGNOSIS — Z6841 Body Mass Index (BMI) 40.0 and over, adult: Secondary | ICD-10-CM

## 2016-01-15 MED ORDER — HYDROCHLOROTHIAZIDE 12.5 MG PO TABS: 12.5000 mg | ORAL_TABLET | Freq: Every day | ORAL | 1 refills | Status: DC

## 2016-01-15 MED ORDER — LISINOPRIL 40 MG PO TABS: 40.0000 mg | ORAL_TABLET | Freq: Every day | ORAL | 1 refills | Status: DC

## 2016-01-15 NOTE — Progress Notes (Signed)
Pre visit review using our clinic review tool, if applicable. No additional management support is needed unless otherwise documented below in the visit note. 

## 2016-01-15 NOTE — Patient Instructions (Signed)
No power or Internet at the time of visit.

## 2016-01-15 NOTE — Progress Notes (Addendum)
HPI:   [name] is a 70 y.o. female, who is here today to establish care with me.  Former PCP: Dr Penny Gibson. Last preventive routine visit: per pt report 07/2015.  Concerns today: wt, med refill, hearing loss.   Hearing loss: Today she would like for me to check her ears, she feels like she may have cerumen causing hearing loss. According to patient, for a while she has had intermittent the hearing loss, bilateral, she denies associated otalgia or dizziness. She has more difficulty when background noise.  He has history of allergic rhinitis, takes Zyrtec 10 mg daily. + Nasal congestion and rhinorrhea intermittently. She was a Pharmacist, hospital, she denies any other noise exposure that could cause hearing loss.  No Hx of ear trauma or OM.   Problem seems to be stable.  Hypertension: Currently she is on Lisinopril 40 mg and HCTZ 12.5 mg daily. She is taking medications as instructed, no side effects reported. She does not monitor BP at home. She has not noted unusual headache, visual changes, exertional chest pain, dyspnea,  focal weakness, or edema.   Lab Results  Component Value Date   CREATININE 0.71 01/31/2014   BUN 13 01/31/2014   NA 139 01/31/2014   K 4.2 01/31/2014   CL 97 01/31/2014   CO2 25 01/31/2014    Reporting labs done about 6 months ago at her former PCP's office.  + Fatigue, for a while and unchanged. She states that sometimes she also has trouble getting adequate sleep. +Loud snoring and according to patient daughter has witnessed apnea while she is asleep.  She exercises regularly, walks a few times per week. She is not consistent with a healthy diet. She has tried Nutrisystem and other diets, she usually loses weight but regained it when she follows a "normal" diet.  Arthralgias: Years history of joint pain, Hx of OA, mainly IP hands, knees, right shoulder, and back. She has tried over-the-counter Tylenol but it doesn't help. Aleve seems to help but  she is not sure if she should continue it. No limitation of ROM, erythema, or joint erythema.   Review of Systems  Constitutional: Positive for fatigue. Negative for activity change, appetite change, fever and unexpected weight change.  HENT: Positive for congestion, hearing loss (Intermittently), rhinorrhea and sneezing. Negative for mouth sores, nosebleeds, tinnitus and trouble swallowing.   Eyes: Negative for redness and visual disturbance.  Respiratory: Negative for cough, shortness of breath and wheezing.   Cardiovascular: Negative for chest pain, palpitations and leg swelling.  Gastrointestinal: Negative for abdominal pain, nausea and vomiting.       Negative for changes in bowel habits.  Endocrine: Negative for cold intolerance, heat intolerance and polyphagia.  Genitourinary: Negative for decreased urine volume, difficulty urinating, dysuria and hematuria.  Musculoskeletal: Positive for arthralgias and back pain. Negative for gait problem, joint swelling and myalgias.  Skin: Negative for color change and rash.  Allergic/Immunologic: Positive for environmental allergies.  Neurological: Negative for dizziness, seizures, syncope, weakness, numbness and headaches.  Psychiatric/Behavioral: Positive for sleep disturbance. Negative for confusion.      Current Outpatient Prescriptions on File Prior to Visit  Medication Sig Dispense Refill  . acetaminophen (TYLENOL) 500 MG tablet Take 500 mg by mouth every 6 (six) hours as needed.    . Iron-Vitamin C (VITRON-C PO) Take by mouth. Take one tablet daily     No current facility-administered medications on file prior to visit.      Past  Medical History:  Diagnosis Date  . Anemia   . Arthritis   . Hypertension    Allergies  Allergen Reactions  . Penicillins Anaphylaxis    Family History  Problem Relation Age of Onset  . Heart attack Mother   . Heart disease Mother   . Cancer Sister   . Stroke Maternal Grandmother     Social  History   Social History  . Marital status: Divorced    Spouse name: N/A  . Number of children: N/A  . Years of education: N/A   Social History Main Topics  . Smoking status: Never Smoker  . Smokeless tobacco: Never Used  . Alcohol use No  . Drug use: No  . Sexual activity: No   Other Topics Concern  . None   Social History Narrative  . None    Vitals:   01/15/16 0821  BP: 133/90  Pulse: 88  Temp: 98.2 F (36.8 C)    Body mass index is 43.51 kg/m.     Physical Exam  Nursing note and vitals reviewed. Constitutional: She is oriented to person, place, and time. She appears well-developed. No distress.  HENT:  Head: Atraumatic.  Right Ear: Tympanic membrane, external ear and ear canal normal.  Left Ear: Tympanic membrane, external ear and ear canal normal.  Mouth/Throat: Oropharynx is clear and moist and mucous membranes are normal.  Eyes: Conjunctivae and EOM are normal. Pupils are equal, round, and reactive to light.  Cardiovascular: Normal rate and regular rhythm.   No murmur heard. Pulses:      Dorsalis pedis pulses are 2+ on the right side, and 2+ on the left side.  Respiratory: Effort normal and breath sounds normal. No respiratory distress.  GI: Soft. She exhibits no mass. There is no hepatomegaly. There is no tenderness.  Musculoskeletal: She exhibits edema (trace pitting edema LE bilateral.).  No major deformities or signs of synovitis.  Lymphadenopathy:    She has no cervical adenopathy.  Neurological: She is alert and oriented to person, place, and time. She has normal strength. Coordination and gait normal.  Skin: Skin is warm. No rash noted. No erythema.  Psychiatric: She has a normal mood and affect.  Well groomed, good eye contact.      ASSESSMENT AND PLAN:     Penny Gibson was seen today for new patient (initial visit).  Diagnoses and all orders for this visit:  Hearing loss, bilateral  We discussed possible causes. It does not seem to  be caused by cerumen impaction. I recommended heating test here in the office but she has no time today, prefers to wait until next office visit. Also she is not interested in referral to audiologist.   Generalized osteoarthritis of multiple sites  We discussed some side effects of NSAIDs, Aleve over-the-counter seems to help, so she will continue that she needs to monitor for BP closely. Low impact exercise and weight loss might help as well.   Essential hypertension  Adequately controlled overall, better when re-checked. Monitor BP at home. No changes in current management. Low salt diet recommended. Eye exam recommended annually. F/U in 3 months, before if needed.  -     lisinopril (PRINIVIL,ZESTRIL) 40 MG tablet; Take 1 tablet (40 mg total) by mouth daily. -     hydrochlorothiazide (HYDRODIURIL) 12.5 MG tablet; Take 1 tablet (12.5 mg total) by mouth daily.  Other seasonal allergic rhinitis  Eustachian tube dysfunction could also cause perception of hearing loss. Autoinflation maneuvers recommended. No changes  in Zyrtec 10 mg. F/U in 3 months.   BMI 40.0-44.9, adult ([place])  We discussed benefits of wt loss as well as adverse effects of obesity. Consistency with healthy diet and physical activity recommended. Weight Watchers is a good option as well as daily brisk walking for 15-30 min as tolerated.       -During office visit today while in the not have power or Internet service, so AVS was not printed. -In regard to fatigue and reported ? ff apnea during sleep witnessed by daughter, she prefers to hold on sleep study. -She did not want to have lab work today, she has difficulty with transportation and somebody was waiting for her, so she didn't have time.        Penny Andy G. Martinique, MD  [facility]. [place] office.

## 2016-01-15 NOTE — Telephone Encounter (Signed)
Called and spoke with patient and let her know there is no combination drug.

## 2016-02-13 ENCOUNTER — Other Ambulatory Visit: Payer: Self-pay

## 2016-03-19 ENCOUNTER — Ambulatory Visit (INDEPENDENT_AMBULATORY_CARE_PROVIDER_SITE_OTHER): Payer: Medicare Other | Admitting: Family Medicine

## 2016-03-19 ENCOUNTER — Encounter: Payer: Self-pay | Admitting: Family Medicine

## 2016-03-19 VITALS — BP 100/78 | HR 96 | Temp 98.6°F | Ht 61.0 in | Wt 226.8 lb

## 2016-03-19 DIAGNOSIS — J309 Allergic rhinitis, unspecified: Secondary | ICD-10-CM

## 2016-03-19 DIAGNOSIS — Z23 Encounter for immunization: Secondary | ICD-10-CM | POA: Diagnosis not present

## 2016-03-19 DIAGNOSIS — Z6841 Body Mass Index (BMI) 40.0 and over, adult: Secondary | ICD-10-CM

## 2016-03-19 DIAGNOSIS — J40 Bronchitis, not specified as acute or chronic: Secondary | ICD-10-CM | POA: Diagnosis not present

## 2016-03-19 MED ORDER — ALBUTEROL SULFATE HFA 108 (90 BASE) MCG/ACT IN AERS: 2.0000 | INHALATION_SPRAY | Freq: Four times a day (QID) | RESPIRATORY_TRACT | 0 refills | Status: DC | PRN

## 2016-03-19 MED ORDER — CETIRIZINE HCL 10 MG PO TABS: 10.0000 mg | ORAL_TABLET | Freq: Every day | ORAL | 2 refills | Status: DC

## 2016-03-19 NOTE — Addendum Note (Signed)
Addended by: Lucretia Kern on: 03/19/2016 10:14 AM   Modules accepted: Orders

## 2016-03-19 NOTE — Progress Notes (Signed)
Pre visit review using our clinic review tool, if applicable. No additional management support is needed unless otherwise documented below in the visit note. 

## 2016-03-19 NOTE — Patient Instructions (Signed)
BEFORE YOU LEAVE: -follow up: with your doctor in 3 months -flu shot   We recommend the following healthy lifestyle for LIFE: 1) Small portions.   Tip: eat off of a salad plate instead of a dinner plate.  Tip: It is ok to feel hungry after a meal - that likely means you ate an appropriate portion.  Tip: if you need more or a snack choose fruits, veggies and/or a handful of nuts or seeds.  2) Eat a healthy clean diet.  * Tip: Avoid (less then 1 serving per week): processed foods, sweets, sweetened drinks, white starches (rice, flour, bread, potatoes, pasta, etc), red meat, fast foods, butter  *Tip: CHOOSE instead   * 5-9 servings per day of fresh or frozen fruits and vegetables (but not corn, potatoes, bananas, canned or dried fruit)   *nuts and seeds, beans   *olives and olive oil   *small portions of lean meats such as fish and white chicken    *small portions of whole grains  3)Get at least 150 minutes of sweaty aerobic exercise per week.  4)Reduce stress - consider counseling, meditation and relaxation to balance other aspects of your life.

## 2016-03-19 NOTE — Progress Notes (Signed)
HPI:  Follow up for refills:  Allergic rhinitis: -seasonal allergies with rhinitis, sneezing and occ wheezing -reports prior doc gave her zyrtec and alb to use prn when has wheezing with VURI or allergies -wants refills on zyrtec and albuterol -denies any wheezing currently -denies fevers, malaise, sob  Obesity: -reports Dr. Martinique told her to do weight watchers, she feels none of these programs work - she has done them all and looses weight (20-40 lbs) but then gains it all back when stops the program -she is happy to [facility] Martinique to know   ROS: See pertinent positives and negatives per HPI.  Past Medical History:  Diagnosis Date  . Anemia   . Arthritis   . Hypertension     Past Surgical History:  Procedure Laterality Date  . BREAST LUMPECTOMY Left 2010  . TUBAL LIGATION      Family History  Problem Relation Age of Onset  . Heart attack Mother   . Heart disease Mother   . Cancer Sister   . Stroke Maternal Grandmother     Social History   Social History  . Marital status: Divorced    Spouse name: N/A  . Number of children: N/A  . Years of education: N/A   Social History Main Topics  . Smoking status: Never Smoker  . Smokeless tobacco: Never Used  . Alcohol use No  . Drug use: No  . Sexual activity: No   Other Topics Concern  . None   Social History Narrative  . None     Current Outpatient Prescriptions:  .  acetaminophen (TYLENOL) 500 MG tablet, Take 500 mg by mouth every 6 (six) hours as needed., Disp: , Rfl:  .  aspirin 81 MG tablet, Take 81 mg by mouth daily., Disp: , Rfl:  .  cetirizine (ZYRTEC) 10 MG tablet, Take 10 mg by mouth daily., Disp: , Rfl:  .  hydrochlorothiazide (HYDRODIURIL) 12.5 MG tablet, Take 1 tablet (12.5 mg total) by mouth daily., Disp: 90 tablet, Rfl: 1 .  Iron-Vitamin C (VITRON-C PO), Take by mouth. Take one tablet daily, Disp: , Rfl:  .  lisinopril (PRINIVIL,ZESTRIL) 40  MG tablet, Take 1 tablet (40 mg total) by mouth daily., Disp: 90 tablet, Rfl: 1  EXAM:  Vitals:   03/19/16 0932  BP: 100/78  Pulse: 96  Temp: 98.6 F (37 C)    Body mass index is 42.85 kg/m.  GENERAL: vitals reviewed and listed above, alert, oriented, appears well hydrated and in no acute distress  HEENT: atraumatic, conjunttiva clear, no obvious abnormalities on inspection of external nose and ears, normal appearance of ear canals and TMs, clear nasal congestion, mild post oropharyngeal erythema with PND, no tonsillar edema or exudate, no sinus TTP  NECK: no obvious masses on inspection  LUNGS: clear to auscultation bilaterally, no wheezes, rales or rhonchi, good air movement  CV: HRRR, no peripheral edema  MS: moves all extremities without noticeable abnormality  PSYCH: pleasant and cooperative, no obvious depression or anxiety  ASSESSMENT AND PLAN:  Discussed the following assessment and plan:  Allergic rhinitis, unspecified allergic rhinitis type Bronchitis -prescriptions given  BMI 40.0-44.9, adult ([place]) -had lengthy discussion about lifestyle changes, importance of using programs like weight watchers as a tool for true lifestyle changes to stick with for life; advised consideration of a 5 year plan (then usually this will result in true lifestyle changes) -advised the med diet and regular exercise and  provided summary in hand out   Follow up with PCP in 3 months  Flu shot today -Patient advised to return or notify a doctor immediately if symptoms worsen or persist or new concerns arise.  Patient Instructions  BEFORE YOU LEAVE: -follow up: with your doctor in 3 months -flu shot   We recommend the following healthy lifestyle for LIFE: 1) Small portions.   Tip: eat off of a salad plate instead of a dinner plate.  Tip: It is ok to feel hungry after a meal - that likely means you ate an appropriate portion.  Tip: if you need more or a snack choose fruits,  veggies and/or a handful of nuts or seeds.  2) Eat a healthy clean diet.  * Tip: Avoid (less then 1 serving per week): processed foods, sweets, sweetened drinks, white starches (rice, flour, bread, potatoes, pasta, etc), red meat, fast foods, butter  *Tip: CHOOSE instead   * 5-9 servings per day of fresh or frozen fruits and vegetables (but not corn, potatoes, bananas, canned or dried fruit)   *nuts and seeds, beans   *olives and olive oil   *small portions of lean meats such as fish and white chicken    *small portions of whole grains  3)Get at least 150 minutes of sweaty aerobic exercise per week.  4)Reduce stress - consider counseling, meditation and relaxation to balance other aspects of your life.     Colin Benton R., DO

## 2016-03-30 DIAGNOSIS — H2513 Age-related nuclear cataract, bilateral: Secondary | ICD-10-CM | POA: Diagnosis not present

## 2016-03-30 DIAGNOSIS — H25012 Cortical age-related cataract, left eye: Secondary | ICD-10-CM | POA: Diagnosis not present

## 2016-03-30 DIAGNOSIS — H5203 Hypermetropia, bilateral: Secondary | ICD-10-CM | POA: Diagnosis not present

## 2016-03-30 DIAGNOSIS — H524 Presbyopia: Secondary | ICD-10-CM | POA: Diagnosis not present

## 2016-05-28 ENCOUNTER — Ambulatory Visit (INDEPENDENT_AMBULATORY_CARE_PROVIDER_SITE_OTHER): Payer: Medicare Other | Admitting: Family Medicine

## 2016-05-28 ENCOUNTER — Encounter: Payer: Self-pay | Admitting: Family Medicine

## 2016-05-28 VITALS — BP 138/88 | HR 84 | Temp 98.2°F | Resp 12 | Wt 224.6 lb

## 2016-05-28 DIAGNOSIS — I10 Essential (primary) hypertension: Secondary | ICD-10-CM | POA: Diagnosis not present

## 2016-05-28 DIAGNOSIS — J45909 Unspecified asthma, uncomplicated: Secondary | ICD-10-CM | POA: Insufficient documentation

## 2016-05-28 DIAGNOSIS — D509 Iron deficiency anemia, unspecified: Secondary | ICD-10-CM

## 2016-05-28 DIAGNOSIS — J4520 Mild intermittent asthma, uncomplicated: Secondary | ICD-10-CM | POA: Insufficient documentation

## 2016-05-28 DIAGNOSIS — J302 Other seasonal allergic rhinitis: Secondary | ICD-10-CM

## 2016-05-28 DIAGNOSIS — M159 Polyosteoarthritis, unspecified: Secondary | ICD-10-CM | POA: Diagnosis not present

## 2016-05-28 DIAGNOSIS — Z6841 Body Mass Index (BMI) 40.0 and over, adult: Secondary | ICD-10-CM | POA: Diagnosis not present

## 2016-05-28 LAB — CBC
HEMATOCRIT: 35.5 % — AB (ref 36.0–46.0)
Hemoglobin: 11.2 g/dL — ABNORMAL LOW (ref 12.0–15.0)
MCHC: 31.4 g/dL (ref 30.0–36.0)
MCV: 76.2 fl — AB (ref 78.0–100.0)
Platelets: 336 10*3/uL (ref 150.0–400.0)
RBC: 4.66 Mil/uL (ref 3.87–5.11)
RDW: 19.9 % — AB (ref 11.5–15.5)
WBC: 9.8 10*3/uL (ref 4.0–10.5)

## 2016-05-28 LAB — COMPREHENSIVE METABOLIC PANEL
ALBUMIN: 4.6 g/dL (ref 3.5–5.2)
ALT: 24 U/L (ref 0–35)
AST: 28 U/L (ref 0–37)
Alkaline Phosphatase: 92 U/L (ref 39–117)
BUN: 14 mg/dL (ref 6–23)
CHLORIDE: 99 meq/L (ref 96–112)
CO2: 31 mEq/L (ref 19–32)
Calcium: 10 mg/dL (ref 8.4–10.5)
Creatinine, Ser: 0.81 mg/dL (ref 0.40–1.20)
GFR: 90.15 mL/min (ref >60.00)
Glucose, Bld: 102 mg/dL — ABNORMAL HIGH (ref 70–99)
POTASSIUM: 4.2 meq/L (ref 3.5–5.1)
SODIUM: 140 meq/L (ref 135–145)
Total Bilirubin: 0.4 mg/dL (ref 0.2–1.2)
Total Protein: 9.1 g/dL — ABNORMAL HIGH (ref 6.0–8.3)

## 2016-05-28 LAB — LIPID PANEL
CHOLESTEROL: 229 mg/dL — AB (ref 0–200)
HDL: 48.2 mg/dL (ref >39.00)
LDL CALC: 145 mg/dL — AB (ref 0–99)
NonHDL: 180.39
TRIGLYCERIDES: 177 mg/dL — AB (ref 0.0–149.0)
Total CHOL/HDL Ratio: 5
VLDL: 35.4 mg/dL (ref 0.0–40.0)

## 2016-05-28 MED ORDER — HYDROCHLOROTHIAZIDE 12.5 MG PO TABS: 12.5000 mg | ORAL_TABLET | Freq: Every day | ORAL | 2 refills | Status: DC

## 2016-05-28 MED ORDER — DULOXETINE HCL 30 MG PO CPEP: 30.0000 mg | ORAL_CAPSULE | Freq: Every day | ORAL | 1 refills | Status: DC

## 2016-05-28 MED ORDER — FLUTICASONE PROPIONATE 50 MCG/ACT NA SUSP: 1.0000 | Freq: Two times a day (BID) | NASAL | 3 refills | Status: DC

## 2016-05-28 MED ORDER — ALBUTEROL SULFATE HFA 108 (90 BASE) MCG/ACT IN AERS: 2.0000 | INHALATION_SPRAY | Freq: Four times a day (QID) | RESPIRATORY_TRACT | 2 refills | Status: DC | PRN

## 2016-05-28 MED ORDER — LISINOPRIL 40 MG PO TABS: 40.0000 mg | ORAL_TABLET | Freq: Every day | ORAL | 2 refills | Status: DC

## 2016-05-28 NOTE — Progress Notes (Signed)
Pre visit review using our clinic review tool, if applicable. No additional management support is needed unless otherwise documented below in the visit note. 

## 2016-05-28 NOTE — Progress Notes (Signed)
HPI:   Penny Gibson is a 70 y.o. female, who is here today to follow on some of her chronic    Last OV, she was concerned about hearing loss, we did not have power at that time, so I could not check ears. She states that it has resolved, no hearing loss now, denies earache or drainage.  C/O dry eyes and tearing, she uses OTC eye drops. She is reporting eye exam by eye care provider about 2 months and she was recommended  OTC natural tears.She has not noted visual changes, conjunctival erythema. + Eye and nose pruritus. Hx of allergic rhinitis, + nasal congestion and rhinorrhea. She takes Zyrtec 10 mg daily.   Hypertension:   Dx at 70 years old.  Currently on Lisinopril 40 mg daily and HCTZ 25 mg daily.  She is taking medications as instructed.  She has not noted unusual headache,exertional chest pain, dyspnea,  focal weakness, or edema.   Lab Results  Component Value Date   CREATININE 0.71 01/31/2014   BUN 13 01/31/2014   NA 139 01/31/2014   K 4.2 01/31/2014   CL 97 01/31/2014   CO2 25 01/31/2014    BP readings at home is "fine", lower reading that BP here today.  -She has decreased meals portions and started water exercises at [facility], she has noted some wt loss.  + Fatigue, which has been better with exercise.  -OA, mainly knees but also IP and shoulder (R). Pain is exacerbated by prolonged walking/standing and alleviated by rest. Sometimes can be "bad", stable otherwise. She takes Tylenol, which does not help much.  + Stiffness, mainly with raining.  +Lower back pain, achy, no radiated, no numbness or tingling of LE, no saddle anesthesia or urine/bowel incontinence.    Concerns today: Albuterol inh refill Hx of asthma, wheezing once weekly, no noctunal symptoms. She has not noted chills, fever, productive cough, or dyspnea.  No Hx of tobacco use.   Hx of iron deficiency anemia, currently she is on iron supplementations. Last  [place], according to pt, it was "fine", not sure about f/u recommendations.    Review of Systems  Constitutional: Positive for fatigue. Negative for activity change, appetite change, fever and unexpected weight change.  HENT: Positive for congestion and rhinorrhea. Negative for ear pain, hearing loss, mouth sores, nosebleeds, sinus pain, sore throat, trouble swallowing and voice change.   Eyes: Positive for discharge and itching. Negative for photophobia, pain, redness and visual disturbance.  Respiratory: Positive for wheezing. Negative for cough and shortness of breath.   Cardiovascular: Negative for chest pain, palpitations and leg swelling.  Gastrointestinal: Negative for abdominal pain, nausea and vomiting.       Negative for changes in bowel habits.  Genitourinary: Negative for decreased urine volume, difficulty urinating and hematuria.  Musculoskeletal: Positive for arthralgias and back pain.  Skin: Negative for rash.  Allergic/Immunologic: Positive for environmental allergies.  Neurological: Negative for syncope, weakness, numbness and headaches.  Psychiatric/Behavioral: Negative for confusion. The patient is not nervous/anxious.       Current Outpatient Prescriptions on File Prior to Visit  Medication Sig Dispense Refill  . acetaminophen (TYLENOL) 500 MG tablet Take 500 mg by mouth every 6 (six) hours as needed.    Marland Kitchen aspirin 81 MG tablet Take 81 mg by mouth daily.    . cetirizine (ZYRTEC) 10 MG tablet Take 1 tablet (10 mg total) by mouth daily. 30 tablet  2  . Iron-Vitamin C (VITRON-C PO) Take by mouth. Take one tablet daily     No current facility-administered medications on file prior to visit.      Past Medical History:  Diagnosis Date  . Anemia   . Arthritis   . Hypertension    Allergies  Allergen Reactions  . Penicillins Anaphylaxis    Social History   Social History  . Marital status: Divorced    Spouse name: N/A    . Number of children: N/A  . Years of education: N/A   Social History Main Topics  . Smoking status: Never Smoker  . Smokeless tobacco: Never Used  . Alcohol use No  . Drug use: No  . Sexual activity: No   Other Topics Concern  . None   Social History Narrative  . None    Vitals:   05/28/16 0922  BP: 138/88  Pulse: 84  Resp: 12  Temp: 98.2 F (36.8 C)   Body mass index is 42.44 kg/m.   Wt Readings from Last 3 Encounters:  05/28/16 224 lb 9.6 oz (101.9 kg)  03/19/16 226 lb 12.8 oz (102.9 kg)  01/15/16 230 lb 4 oz (104.4 kg)      Physical Exam  Nursing note and vitals reviewed. Constitutional: She is oriented to person, place, and time. She appears well-developed. No distress.  HENT:  Head: Atraumatic.  Right Ear: Tympanic membrane, external ear and ear canal normal.  Left Ear: Tympanic membrane, external ear and ear canal normal.  Mouth/Throat: Oropharynx is clear and moist and mucous membranes are normal.  Hypertrophic turbinates.  Eyes: Conjunctivae and EOM are normal. Pupils are equal, round, and reactive to light.  Bilateral epiphora, mild.  Neck: No tracheal deviation present. No thyroid mass and no thyromegaly present.  Cardiovascular: Normal rate and regular rhythm.   No murmur heard. Pulses:      Dorsalis pedis pulses are 2+ on the right side, and 2+ on the left side.  Respiratory: Effort normal and breath sounds normal. No respiratory distress.  GI: Soft. She exhibits no mass. There is no hepatomegaly. There is no tenderness.  Musculoskeletal: She exhibits edema (Trace pitting edema , LE bilateral).  Knee crepitus bilateral and mild decrease in ROM, no pain elicited, no effusion or erythema.  Lymphadenopathy:    She has no cervical adenopathy.  Neurological: She is alert and oriented to person, place, and time. She has normal strength. Coordination and gait normal.  Skin: Skin is warm. No erythema.  Psychiatric: She has a normal mood and affect.   Well groomed, good eye contact.      ASSESSMENT AND PLAN:     [name] was seen today for follow-up.  Diagnoses and all orders for this visit:  Lab Results  Component Value Date   WBC 9.8 05/28/2016   HGB 11.2 (L) 05/28/2016   HCT 35.5 (L) 05/28/2016   MCV 76.2 (L) 05/28/2016   PLT 336.0 05/28/2016     Chemistry      Component Value Date/Time   NA 140 05/28/2016 1019   NA 139 01/31/2014 1002   K 4.2 05/28/2016 1019   CL 99 05/28/2016 1019   CO2 31 05/28/2016 1019   BUN 14 05/28/2016 1019   BUN 13 01/31/2014 1002   CREATININE 0.81 05/28/2016 1019   CREATININE 0.88 06/22/2013 1506      Component Value Date/Time   CALCIUM 10.0 05/28/2016 1019   ALKPHOS 92 05/28/2016 1019   AST 28 05/28/2016 1019  ALT 24 05/28/2016 1019   BILITOT 0.4 05/28/2016 1019      Lab Results  Component Value Date   CHOL 229 (H) 05/28/2016   HDL 48.20 05/28/2016   LDLCALC 145 (H) 05/28/2016   TRIG 177.0 (H) 05/28/2016   CHOLHDL 5 05/28/2016     Generalized osteoarthritis of multiple sites  After discussion of a few treatment options, she agrees with trying Cymbalta. We discussed some side effects. Wt loss and low impact exercise may also help, [place]. Fall precautions discussed. F/U in 3 months, she will let me know if any side effect from medications.  -     DULoxetine (CYMBALTA) 30 MG capsule; Take 1 capsule (30 mg total) by mouth daily.  Mild intermittent asthma without complication  Overall well controlled. Continue Albuterol inh as needed. May consider Singulair 10 mg. F/U in 3 months.  -     albuterol (PROAIR HFA) 108 (90 Base) MCG/ACT inhaler; Inhale 2 puffs into the lungs every 6 (six) hours as needed for wheezing or shortness of breath.  Essential hypertension -     Comprehensive metabolic panel -     Lipid panel -     hydrochlorothiazide (HYDRODIURIL) 12.5 MG tablet; Take 1 tablet (12.5 mg total) by mouth daily. -     lisinopril (PRINIVIL,ZESTRIL) 40 MG  tablet; Take 1 tablet (40 mg total) by mouth daily.  Iron deficiency anemia, unspecified iron deficiency anemia type  No changes in current management, will follow labs done today and will give further recommendations accordingly. Will try to obtain copy of colonoscopy report.   -     CBC  BMI 40.0-44.9, adult [facility])  She lost about 6 Lb since I last saw her, 12/2015. Encouraged to continue working on a healthier diet and regular exercise as tolerated.  -     Lipid panel  Other seasonal allergic rhinitis  She will try intranasal steroid, Flonase. Nasal irrigations with saline. Continue Zyrtec 10 mg. May add Singulair 10 mg next OV depending of symptoms control.   -     fluticasone (FLONASE) 50 MCG/ACT nasal spray; Place 1 spray into both nostrils 2 (two) times daily.         -Ms. Ambrosia Usry [place] was advised to return sooner than planned today if new concerns arise.       Gowri Suchan G. Martinique, MD  [facility]. [place] office.

## 2016-05-28 NOTE — Patient Instructions (Signed)
A few things to remember from today's visit:   Essential hypertension - Plan: Comprehensive metabolic panel, Lipid panel, hydrochlorothiazide (HYDRODIURIL) 12.5 MG tablet, lisinopril (PRINIVIL,ZESTRIL) 40 MG tablet  Generalized osteoarthritis of multiple sites - Plan: DULoxetine (CYMBALTA) 30 MG capsule  Iron deficiency anemia, unspecified iron deficiency anemia type - Plan: CBC  BMI 40.0-44.9, adult ([facility]) - Plan: Lipid panel  Other seasonal allergic rhinitis - Plan: fluticasone (FLONASE) 50 MCG/ACT nasal spray  Cymbalta added and Flonase   Please be sure medication list is accurate. If a new problem present, please set up appointment sooner than planned today.

## 2016-05-29 ENCOUNTER — Encounter: Payer: Self-pay | Admitting: Family Medicine

## 2016-05-31 ENCOUNTER — Other Ambulatory Visit: Payer: Self-pay

## 2016-05-31 DIAGNOSIS — M159 Polyosteoarthritis, unspecified: Secondary | ICD-10-CM

## 2016-05-31 DIAGNOSIS — I10 Essential (primary) hypertension: Secondary | ICD-10-CM

## 2016-05-31 DIAGNOSIS — J302 Other seasonal allergic rhinitis: Secondary | ICD-10-CM

## 2016-05-31 MED ORDER — ALBUTEROL SULFATE HFA 108 (90 BASE) MCG/ACT IN AERS: 2.0000 | INHALATION_SPRAY | Freq: Four times a day (QID) | RESPIRATORY_TRACT | 2 refills | Status: DC | PRN

## 2016-05-31 MED ORDER — DULOXETINE HCL 30 MG PO CPEP: 30.0000 mg | ORAL_CAPSULE | Freq: Every day | ORAL | 1 refills | Status: DC

## 2016-05-31 MED ORDER — FLUTICASONE PROPIONATE 50 MCG/ACT NA SUSP: 1.0000 | Freq: Two times a day (BID) | NASAL | 3 refills | Status: DC

## 2016-05-31 MED ORDER — HYDROCHLOROTHIAZIDE 12.5 MG PO TABS: 12.5000 mg | ORAL_TABLET | Freq: Every day | ORAL | 2 refills | Status: DC

## 2016-07-19 ENCOUNTER — Telehealth: Payer: Self-pay | Admitting: Family Medicine

## 2016-07-19 NOTE — Telephone Encounter (Signed)
Patient has a rash on her hands and arms and it itches and burns patient does not know if it from the medication. Patient started talking benadryl and cortisone 10 and apply to the rash.  Contact Info: [phone]

## 2016-07-19 NOTE — Telephone Encounter (Signed)
She could discontinue medication and see if rash resolves.  I think she refers to Cymbalta she started in 05/2016 to treat OA.  Thanks, BJ

## 2016-07-20 NOTE — Addendum Note (Signed)
Addended by: Wynn Banker H on: 07/20/2016 10:57 AM   Modules accepted: Orders

## 2016-07-20 NOTE — Telephone Encounter (Signed)
Left voicemail letting patient know to discontinue medication & to see if that helps the rash resolve. Advised to call back with any questions.

## 2016-07-20 NOTE — Telephone Encounter (Signed)
Spoke with pt as she had questions about which medication to stop. She states that she started Cymbalta in December and about a week later developed increased eczema patches on her arms that are very itchy. Advised her that per Dr Martinique she can stop this medication and see if itching improves. Asked her to contact office and let us know if she improves or not. She voiced understanding. Nothing further needed at this time.

## 2016-08-16 ENCOUNTER — Telehealth: Payer: Self-pay | Admitting: Family Medicine

## 2016-08-16 NOTE — Telephone Encounter (Signed)
It is fine with me. ?Thanks ?

## 2016-08-16 NOTE — Telephone Encounter (Signed)
Patient would like to switch to the [place] office. It is closer to where she lives.  Penny Gibson is that ok with you? [name] is that ok with you?

## 2016-08-18 NOTE — Telephone Encounter (Signed)
Pt called to check on this request to transfer. [place] please advise.

## 2016-08-18 NOTE — Telephone Encounter (Signed)
Pt following up on request to transfer to [place].

## 2016-08-18 NOTE — Telephone Encounter (Signed)
Left detailed message for pt to call back and schedule an appt with [facility]. Need to inform patient that she does not manage chronic pain.

## 2016-08-18 NOTE — Telephone Encounter (Signed)
[place] with me, but please inform patient. I do not manage chronic pain. Thank you

## 2016-08-26 ENCOUNTER — Ambulatory Visit: Payer: Medicare Other | Admitting: Family Medicine

## 2016-09-24 ENCOUNTER — Encounter: Payer: Self-pay | Admitting: Nurse Practitioner

## 2016-09-24 ENCOUNTER — Ambulatory Visit (INDEPENDENT_AMBULATORY_CARE_PROVIDER_SITE_OTHER): Payer: Medicare Other | Admitting: Nurse Practitioner

## 2016-09-24 ENCOUNTER — Other Ambulatory Visit (INDEPENDENT_AMBULATORY_CARE_PROVIDER_SITE_OTHER): Payer: Medicare Other

## 2016-09-24 ENCOUNTER — Telehealth: Payer: Self-pay | Admitting: Nurse Practitioner

## 2016-09-24 VITALS — BP 140/96 | HR 81 | Temp 98.4°F | Ht 61.0 in | Wt 221.0 lb

## 2016-09-24 DIAGNOSIS — I10 Essential (primary) hypertension: Secondary | ICD-10-CM

## 2016-09-24 DIAGNOSIS — J4520 Mild intermittent asthma, uncomplicated: Secondary | ICD-10-CM

## 2016-09-24 DIAGNOSIS — Z1159 Encounter for screening for other viral diseases: Secondary | ICD-10-CM

## 2016-09-24 DIAGNOSIS — E782 Mixed hyperlipidemia: Secondary | ICD-10-CM

## 2016-09-24 DIAGNOSIS — Z780 Asymptomatic menopausal state: Secondary | ICD-10-CM | POA: Diagnosis not present

## 2016-09-24 DIAGNOSIS — Z0001 Encounter for general adult medical examination with abnormal findings: Secondary | ICD-10-CM

## 2016-09-24 DIAGNOSIS — J302 Other seasonal allergic rhinitis: Secondary | ICD-10-CM

## 2016-09-24 DIAGNOSIS — H9313 Tinnitus, bilateral: Secondary | ICD-10-CM

## 2016-09-24 LAB — LIPID PANEL
Cholesterol: 188 mg/dL (ref 0–200)
HDL: 44.9 mg/dL (ref >39.00)
LDL Cholesterol: 113 mg/dL — ABNORMAL HIGH (ref 0–99)
NONHDL: 143.09
Total CHOL/HDL Ratio: 4
Triglycerides: 148 mg/dL (ref 0.0–149.0)
VLDL: 29.6 mg/dL (ref 0.0–40.0)

## 2016-09-24 LAB — BASIC METABOLIC PANEL
BUN: 11 mg/dL (ref 6–23)
CALCIUM: 9.9 mg/dL (ref 8.4–10.5)
CO2: 30 mEq/L (ref 19–32)
CREATININE: 0.68 mg/dL (ref 0.40–1.20)
Chloride: 99 mEq/L (ref 96–112)
GFR: 110.21 mL/min (ref >60.00)
GLUCOSE: 94 mg/dL (ref 70–99)
Potassium: 4.3 mEq/L (ref 3.5–5.1)
Sodium: 138 mEq/L (ref 135–145)

## 2016-09-24 MED ORDER — FLUTICASONE PROPIONATE 50 MCG/ACT NA SUSP: 1.0000 | Freq: Two times a day (BID) | NASAL | 3 refills | Status: DC

## 2016-09-24 MED ORDER — CETIRIZINE HCL 10 MG PO TABS: 10.0000 mg | ORAL_TABLET | Freq: Every day | ORAL | 2 refills | Status: DC

## 2016-09-24 NOTE — Progress Notes (Signed)
Subjective:    Patient ID: Penny Gibson, female    DOB: 30-Oct-1945, 71 y.o.   MRN: [id number]  Patient presents today for complete physical  Otalgia   There is pain in both ears. This is a recurrent problem. The current episode started more than 1 month ago. The problem occurs constantly. The problem has been waxing and waning. There has been no fever. Associated symptoms include hearing loss and rhinorrhea. Pertinent negatives include no abdominal pain, coughing, diarrhea, ear discharge, headaches, neck pain, rash, sore throat or vomiting. Associated symptoms comments: And ringing in ears. She has tried nothing for the symptoms. Her past medical history is significant for hearing loss. There is no history of a chronic ear infection or a tympanostomy tube.    HTN: controlled with lisinopril and HCTZ.  Asthma: controlled with albuetrol prn. Has used over 1week ago.   Immunizations: (TDAP, Hep C screen, Pneumovax, Influenza, zoster)  Health Maintenance  Topic Date Due  .  Hepatitis C: One time screening is recommended by Center for Disease Control  (CDC) for  adults born from [birth year] through 1964.   12-16-1945  . Colon Cancer Screening  05/14/1997  . DEXA scan (bone density measurement)  05/14/2012  . Flu Shot  01/19/2017  . Mammogram  02/04/2017  . Tetanus Vaccine  06/23/2023  . Pneumonia vaccines  Completed   Diet:regular Weight:  Wt Readings from Last 3 Encounters:  09/24/16 221 lb (100.2 kg)  05/28/16 224 lb 9.6 oz (101.9 kg)  03/19/16 226 lb 12.8 oz (102.9 kg)    Exercise:none Fall Risk: Fall Risk  09/24/2016 02/13/2016 07/26/2014 01/31/2014 01/31/2014 11/15/2013 11/15/2013  Falls in the past year? No No No No No Yes No  Number falls in past yr: - - - - - 2 or more -  Risk for fall due to : - - - History of fall(s) - History of fall(s) -   Home Safety:home alone, has 2 adult children Depression/Suicide: Depression screen [facility] 2/9 09/24/2016 07/26/2014 01/31/2014 11/15/2013  11/15/2013  Decreased Interest 0 0 0 1 0  Down, Depressed, Hopeless 0 0 0 1 1  PHQ - 2 Score 0 0 0 2 1  Altered sleeping - - - 1 -  Tired, decreased energy - - - 0 -  Change in appetite - - - 1 -  Feeling bad or failure about yourself  - - - 1 -  Trouble concentrating - - - 2 -  Moving slowly or fidgety/restless - - - 0 -  Suicidal thoughts - - - 0 -  PHQ-9 Score - - - 7 -   MMSE - Mini Mental State Exam 01/31/2014  Orientation to time 5  Orientation to Place 5  Registration 3  Attention/ Calculation 5  Recall 3  Language- name 2 objects 2  Language- repeat 1  Language- follow 3 step command 3  Language- read & follow direction 1  Write a sentence 1  Copy design 1  Total score 30   Colonoscopy (every 5-35yrs, >50-41yrs):cologuard Dexa (every 2-78yrs, >30yrs):ordered today Pap Smear (every 51yrs for >21-29 without HPV, every 22yrs for >30-53yrs with HPV):up to date Mammogram (yearly, >19yrs):up to date Vision:up to date, last done 03/2016, use corrective lens Dental:use of dentures Advanced Directive: Advanced Directives 04/03/2015  Does Patient Have a Medical Advance Directive? No  Would patient like information on creating a medical advance directive? No - patient declined information  Pre-existing out of facility DNR order (yellow form or pink  MOST form) -   Sexual History (birth control, marital status, STD):single, not sexually active  Medications and allergies reviewed with patient and updated if appropriate.  Patient Active Problem List   Diagnosis Date Noted  . Asthma, mild intermittent 05/28/2016  . Anemia, iron deficiency 07/26/2014  . Other seasonal allergic rhinitis 07/26/2014  . Atypical squamous cells of undetermined significance (ASCUS) on Papanicolaou smear of cervix 01/31/2014  . BMI 40.0-44.9, adult ([place]) 11/15/2013  . Anemia 09/19/2013  . Essential hypertension 09/19/2013  . Generalized osteoarthritis of multiple sites     Current Outpatient  Prescriptions on File Prior to Visit  Medication Sig Dispense Refill  . acetaminophen (TYLENOL) 500 MG tablet Take 500 mg by mouth every 6 (six) hours as needed.    Marland Kitchen albuterol (PROAIR HFA) 108 (90 Base) MCG/ACT inhaler Inhale 2 puffs into the lungs every 6 (six) hours as needed for wheezing or shortness of breath. 1 Inhaler 2  . aspirin 81 MG tablet Take 81 mg by mouth daily.    . hydrochlorothiazide (HYDRODIURIL) 12.5 MG tablet Take 1 tablet (12.5 mg total) by mouth daily. 90 tablet 2  . Iron-Vitamin C (VITRON-C PO) Take by mouth. Take one tablet daily    . lisinopril (PRINIVIL,ZESTRIL) 40 MG tablet Take 1 tablet (40 mg total) by mouth daily. 90 tablet 2   No current facility-administered medications on file prior to visit.     Past Medical History:  Diagnosis Date  . Anemia   . Arthritis   . Hyperlipidemia   . Hypertension     Past Surgical History:  Procedure Laterality Date  . BREAST LUMPECTOMY Left 2010  . TUBAL LIGATION      Social History   Social History  . Marital status: Divorced    Spouse name: N/A  . Number of children: N/A  . Years of education: N/A   Social History Main Topics  . Smoking status: Never Smoker  . Smokeless tobacco: Never Used  . Alcohol use No  . Drug use: No  . Sexual activity: No   Other Topics Concern  . None   Social History Narrative  . None    Family History  Problem Relation Age of Onset  . Heart attack Mother   . Heart disease Mother   . Cancer Sister   . Stroke Maternal Grandmother         Review of Systems  Constitutional: Negative for fever, malaise/fatigue and weight loss.  HENT: Positive for ear pain, hearing loss, rhinorrhea and tinnitus. Negative for congestion, ear discharge and sore throat.   Eyes:       Negative for visual changes  Respiratory: Negative for cough and shortness of breath.   Cardiovascular: Negative for chest pain, palpitations and leg swelling.  Gastrointestinal: Negative for abdominal  pain, blood in stool, constipation, diarrhea, heartburn and vomiting.  Genitourinary: Negative for dysuria, frequency and urgency.  Musculoskeletal: Negative for falls, joint pain, myalgias and neck pain.  Skin: Negative for rash.  Neurological: Negative for dizziness, sensory change and headaches.  Endo/Heme/Allergies: Does not bruise/bleed easily.  Psychiatric/Behavioral: Negative for depression, substance abuse and suicidal ideas. The patient is not nervous/anxious.     Objective:   Vitals:   09/24/16 1124  BP: (!) 140/96  Pulse: 81  Temp: 98.4 F (36.9 C)    Body mass index is 41.76 kg/m.   Physical Examination:  Physical Exam  Constitutional: She is oriented to person, place, and time and well-developed, well-nourished, and in no  distress. No distress.  HENT:  Right Ear: Tympanic membrane, external ear and ear canal normal. No mastoid tenderness. Tympanic membrane is not injected and not bulging.  Left Ear: Hearing, tympanic membrane, external ear and ear canal normal. No mastoid tenderness. Tympanic membrane is not injected and not bulging.  Nose: Mucosal edema and rhinorrhea present. Right sinus exhibits no maxillary sinus tenderness and no frontal sinus tenderness. Left sinus exhibits no maxillary sinus tenderness and no frontal sinus tenderness.  Mouth/Throat: Uvula is midline. She has dentures. Posterior oropharyngeal erythema present. No oropharyngeal exudate.  Eyes: Conjunctivae and EOM are normal. Pupils are equal, round, and reactive to light. No scleral icterus.  Neck: Normal range of motion. Neck supple. No thyromegaly present.  Cardiovascular: Normal rate, normal heart sounds and intact distal pulses.   Pulmonary/Chest: Effort normal and breath sounds normal. She exhibits no tenderness.  Abdominal: Soft. Bowel sounds are normal. She exhibits no distension. There is no tenderness.  Musculoskeletal: Normal range of motion. She exhibits no edema or tenderness.    Lymphadenopathy:    She has no cervical adenopathy.  Neurological: She is alert and oriented to person, place, and time. Gait normal.  Skin: Skin is warm and dry.  Psychiatric: Affect and judgment normal.  Vitals reviewed.   ASSESSMENT and PLAN:  Taheera was seen today for establish care.  Diagnoses and all orders for this visit:  Encounter for preventative adult health care exam with abnormal findings  Essential hypertension -     Basic metabolic panel; Future  Asymptomatic postmenopausal estrogen deficiency -     DG Bone Density; Future  Tinnitus of both ears -     Cancel: Ambulatory referral to Audiology -     Ambulatory referral to Audiology  Mixed hyperlipidemia -     Lipid panel; Future  Mild intermittent asthma without complication -     fluticasone (FLONASE) 50 MCG/ACT nasal spray; Place 1 spray into both nostrils 2 (two) times daily. -     cetirizine (ZYRTEC) 10 MG tablet; Take 1 tablet (10 mg total) by mouth daily.  Other seasonal allergic rhinitis -     fluticasone (FLONASE) 50 MCG/ACT nasal spray; Place 1 spray into both nostrils 2 (two) times daily. -     cetirizine (ZYRTEC) 10 MG tablet; Take 1 tablet (10 mg total) by mouth daily.  Encounter for hepatitis C screening test for low risk patient -     Hepatitis C Antibody; Future   No problem-specific Assessment & Plan notes found for this encounter.     Follow up: Return in about 3 months (around 12/24/2016) for HTN and, hyperlipidemia.  [name], NP

## 2016-09-24 NOTE — Progress Notes (Signed)
Pre visit review using our clinic review tool, if applicable. No additional management support is needed unless otherwise documented below in the visit note. 

## 2016-09-24 NOTE — Telephone Encounter (Signed)
Cologuard ordered  Order # [id number]

## 2016-09-24 NOTE — Patient Instructions (Signed)
You will be contacted with audiology appt, cologuard testing kit,   Make dexa scan appt at front desk.  Go to lab for blood draw.

## 2016-09-25 LAB — HEPATITIS C ANTIBODY: HCV AB: NEGATIVE

## 2016-10-05 DIAGNOSIS — Z1212 Encounter for screening for malignant neoplasm of rectum: Secondary | ICD-10-CM | POA: Diagnosis not present

## 2016-10-05 DIAGNOSIS — Z1211 Encounter for screening for malignant neoplasm of colon: Secondary | ICD-10-CM | POA: Diagnosis not present

## 2016-10-05 LAB — COLOGUARD: COLOGUARD: NEGATIVE

## 2016-10-08 ENCOUNTER — Ambulatory Visit (INDEPENDENT_AMBULATORY_CARE_PROVIDER_SITE_OTHER)
Admission: RE | Admit: 2016-10-08 | Discharge: 2016-10-08 | Disposition: A | Discharge status: Home / Self Care | Payer: Medicare Other | Source: Ambulatory Visit | Attending: Nurse Practitioner | Admitting: Nurse Practitioner

## 2016-10-08 DIAGNOSIS — Z780 Asymptomatic menopausal state: Secondary | ICD-10-CM | POA: Diagnosis not present

## 2016-10-15 ENCOUNTER — Encounter: Payer: Self-pay | Admitting: Nurse Practitioner

## 2016-10-15 NOTE — Progress Notes (Signed)
Abstracted result and sent to scan  

## 2016-12-08 ENCOUNTER — Telehealth: Payer: Self-pay | Admitting: Nurse Practitioner

## 2016-12-08 NOTE — Telephone Encounter (Signed)
Pt states she missed a call form you, please give her a call back

## 2016-12-09 DIAGNOSIS — Z0279 Encounter for issue of other medical certificate: Secondary | ICD-10-CM

## 2016-12-30 ENCOUNTER — Ambulatory Visit: Payer: Medicare Other | Admitting: Nurse Practitioner

## 2016-12-31 ENCOUNTER — Encounter: Payer: Self-pay | Admitting: Nurse Practitioner

## 2016-12-31 ENCOUNTER — Ambulatory Visit (INDEPENDENT_AMBULATORY_CARE_PROVIDER_SITE_OTHER): Payer: Medicare Other | Admitting: Nurse Practitioner

## 2016-12-31 VITALS — BP 148/100 | HR 83 | Temp 98.6°F | Ht 61.0 in | Wt 227.0 lb

## 2016-12-31 DIAGNOSIS — M25562 Pain in left knee: Secondary | ICD-10-CM

## 2016-12-31 DIAGNOSIS — J4521 Mild intermittent asthma with (acute) exacerbation: Secondary | ICD-10-CM | POA: Diagnosis not present

## 2016-12-31 DIAGNOSIS — I10 Essential (primary) hypertension: Secondary | ICD-10-CM

## 2016-12-31 DIAGNOSIS — G8929 Other chronic pain: Secondary | ICD-10-CM | POA: Diagnosis not present

## 2016-12-31 DIAGNOSIS — J302 Other seasonal allergic rhinitis: Secondary | ICD-10-CM

## 2016-12-31 DIAGNOSIS — M1710 Unilateral primary osteoarthritis, unspecified knee: Secondary | ICD-10-CM | POA: Insufficient documentation

## 2016-12-31 DIAGNOSIS — M1712 Unilateral primary osteoarthritis, left knee: Secondary | ICD-10-CM | POA: Insufficient documentation

## 2016-12-31 MED ORDER — IBUPROFEN 200 MG PO TABS: 200.0000 mg | ORAL_TABLET | Freq: Three times a day (TID) | ORAL | 0 refills | Status: DC | PRN

## 2016-12-31 MED ORDER — MOMETASONE FUROATE 50 MCG/ACT NA SUSP: 2.0000 | Freq: Every day | NASAL | 12 refills | Status: DC

## 2016-12-31 MED ORDER — BUDESONIDE-FORMOTEROL FUMARATE 160-4.5 MCG/ACT IN AERO: 1.0000 | INHALATION_SPRAY | Freq: Two times a day (BID) | RESPIRATORY_TRACT | 5 refills | Status: DC

## 2016-12-31 MED ORDER — AMLODIPINE BESYLATE 2.5 MG PO TABS: 2.5000 mg | ORAL_TABLET | Freq: Every day | ORAL | 1 refills | Status: DC

## 2016-12-31 MED ORDER — ALBUTEROL SULFATE HFA 108 (90 BASE) MCG/ACT IN AERS: 2.0000 | INHALATION_SPRAY | Freq: Four times a day (QID) | RESPIRATORY_TRACT | 2 refills | Status: DC | PRN

## 2016-12-31 MED ORDER — LISINOPRIL 40 MG PO TABS: 40.0000 mg | ORAL_TABLET | Freq: Every day | ORAL | 2 refills | Status: DC

## 2016-12-31 NOTE — Patient Instructions (Signed)

## 2016-12-31 NOTE — Progress Notes (Signed)
Subjective:  Patient ID: Penny Gibson, female    DOB: 12-Apr-1946  Age: 71 y.o. MRN: [id number]  CC: Follow-up (3 mo fu--flonase consult--left knee pain--getting worse--sinus--zytec is not helping?)   Knee Pain   The incident occurred more than 1 week ago (chronic with intermittent flares). There was no injury mechanism. The pain is present in the left knee. The quality of the pain is described as aching (stiffness). The pain is severe. The pain has been worsening since onset. Associated symptoms include an inability to bear weight. Pertinent negatives include no loss of motion, loss of sensation, muscle weakness, numbness or tingling. The symptoms are aggravated by weight bearing and movement. She has tried NSAIDs, non-weight bearing and elevation for the symptoms. The treatment provided moderate relief.  hx of OA of multiple sites Joint injection recommended in past by another provider, but she declined at the time. Se does not want any surgical intervention at this time either. Last x-ray done 02/2008  Asthma: Daily use of albuterol due to SOB, wheezing and cough. Unable to use flonase due to nasal irritation. No fever, no purulent congestion.  HTN: Has not been taking HCTZ due to increased urination. BP Readings from Last 3 Encounters:  12/31/16 (!) 148/100  09/24/16 (!) 140/96  05/28/16 138/88   Outpatient Medications Prior to Visit  Medication Sig Dispense Refill  . acetaminophen (TYLENOL) 500 MG tablet Take 500 mg by mouth every 6 (six) hours as needed.    Marland Kitchen aspirin 81 MG tablet Take 81 mg by mouth daily.    . cetirizine (ZYRTEC) 10 MG tablet Take 1 tablet (10 mg total) by mouth daily. 30 tablet 2  . Iron-Vitamin C (VITRON-C PO) Take by mouth. Take one tablet daily    . albuterol (PROAIR HFA) 108 (90 Base) MCG/ACT inhaler Inhale 2 puffs into the lungs every 6 (six) hours as needed for wheezing or shortness of breath. 1 Inhaler 2  . fluticasone (FLONASE) 50 MCG/ACT  nasal spray Place 1 spray into both nostrils 2 (two) times daily. 16 g 3  . lisinopril (PRINIVIL,ZESTRIL) 40 MG tablet Take 1 tablet (40 mg total) by mouth daily. 90 tablet 2  . hydrochlorothiazide (HYDRODIURIL) 12.5 MG tablet Take 1 tablet (12.5 mg total) by mouth daily. (Patient not taking: Reported on 12/31/2016) 90 tablet 2   No facility-administered medications prior to visit.     ROS See HPI  Objective:  BP (!) 148/100   Pulse 83   Temp 98.6 F (37 C)   Ht 5\' 1"  (1.549 m)   Wt 227 lb (103 kg)   SpO2 98%   BMI 42.89 kg/m   BP Readings from Last 3 Encounters:  12/31/16 (!) 148/100  09/24/16 (!) 140/96  05/28/16 138/88    Wt Readings from Last 3 Encounters:  12/31/16 227 lb (103 kg)  09/24/16 221 lb (100.2 kg)  05/28/16 224 lb 9.6 oz (101.9 kg)    Physical Exam  Constitutional: She is oriented to person, place, and time. No distress.  Cardiovascular: Normal rate, regular rhythm and normal heart sounds.   Pulmonary/Chest: Effort normal and breath sounds normal.  Musculoskeletal: She exhibits edema and tenderness.       Left knee: She exhibits effusion. She exhibits normal range of motion, no swelling, no erythema, normal patellar mobility, no bony tenderness and no MCL laxity. Tenderness found. Medial joint line and lateral joint line tenderness noted. No patellar tendon tenderness noted.  Neurological: She is alert and oriented to  person, place, and time.  Skin: Skin is warm and dry.  Vitals reviewed.   Lab Results  Component Value Date   WBC 9.8 05/28/2016   HGB 11.2 (L) 05/28/2016   HCT 35.5 (L) 05/28/2016   PLT 336.0 05/28/2016   GLUCOSE 94 09/24/2016   CHOL 188 09/24/2016   TRIG 148.0 09/24/2016   HDL 44.90 09/24/2016   LDLCALC 113 (H) 09/24/2016   ALT 24 05/28/2016   AST 28 05/28/2016   NA 138 09/24/2016   K 4.3 09/24/2016   CL 99 09/24/2016   CREATININE 0.68 09/24/2016   BUN 11 09/24/2016   CO2 30 09/24/2016   TSH 1.333 10/18/2007    Dg Bone  Density  Result Date: 10/10/2016 Date of study: 10/08/16 Exam: DUAL X-RAY ABSORPTIOMETRY (DXA) FOR BONE MINERAL DENSITY (BMD) Instrument: [employer] Provider: PCP Indication: screening for osteoporosis Comparison: none (please note that it is not possible to compare data from different instruments) Clinical data: Pt is a postmenopausal 71 y.o. female without previous nontraumatic fractures. Results:  Lumbar spine (L1-L4) Femoral neck (FN) 33% distal radius T-score 0.1 RFN:-0.5 LFN:-0.4 n/a Change in BMD from previous DXA test (%) n/a n/a n/a (*) statistically significant Assessment: the BMD is normal according to the [facility] classification for osteoporosis (see below). Fracture risk: low FRAX score: not calculated due to normal BMD Comments: the technical quality of the study is good Recommend optimizing calcium (1200 mg/day) and vitamin D (800 IU/day) intake. No pharmacological treatment is indicated. Followup: Repeat BMD is appropriate after 2 years. WHO criteria for diagnosis of osteoporosis in postmenopausal women and in men 61 y/o or older: - normal: T-score -1.0 to + 1.0 - osteopenia/low bone density: T-score between -2.5 and -1.0 - osteoporosis: T-score below -2.5 - severe osteoporosis: T-score below -2.5 with history of fragility fracture Note: although not part of the WHO classification, the presence of a fragility fracture, regardless of the T-score, should be considered diagnostic of osteoporosis, provided other causes for the fracture have been excluded. Loura Pardon MD    Assessment & Plan:   Penny Gibson was seen today for follow-up.  Diagnoses and all orders for this visit:  Essential hypertension -     amLODipine (NORVASC) 2.5 MG tablet; Take 1 tablet (2.5 mg total) by mouth at bedtime. -     lisinopril (PRINIVIL,ZESTRIL) 40 MG tablet; Take 1 tablet (40 mg total) by mouth daily.  Other seasonal allergic rhinitis -     mometasone (NASONEX) 50 MCG/ACT nasal spray; Place 2  sprays into the nose daily.  Chronic pain of left knee -     Ambulatory referral to Sports Medicine -     ibuprofen (ADVIL,MOTRIN) 200 MG tablet; Take 1 tablet (200 mg total) by mouth every 8 (eight) hours as needed. With food  Mild intermittent asthma with acute exacerbation -     albuterol (PROAIR HFA) 108 (90 Base) MCG/ACT inhaler; Inhale 2 puffs into the lungs every 6 (six) hours as needed for wheezing or shortness of breath. -     budesonide-formoterol (SYMBICORT) 160-4.5 MCG/ACT inhaler; Inhale 1 puff into the lungs 2 (two) times daily. Rinse mouth after use   I have discontinued Penny Gibson's hydrochlorothiazide and fluticasone. I am also having her start on mometasone, amLODipine, ibuprofen, and budesonide-formoterol. Additionally, I am having her maintain her Iron-Vitamin C (VITRON-C PO), acetaminophen, aspirin, cetirizine, lisinopril, and albuterol.  Meds ordered this encounter  Medications  . mometasone (NASONEX) 50 MCG/ACT nasal spray  Sig: Place 2 sprays into the nose daily.    Dispense:  17 g    Refill:  12    Order Specific Question:   Supervising Provider    Answer:   Cassandria Anger [[id number]]  . amLODipine (NORVASC) 2.5 MG tablet    Sig: Take 1 tablet (2.5 mg total) by mouth at bedtime.    Dispense:  90 tablet    Refill:  1    Order Specific Question:   Supervising Provider    Answer:   Cassandria Anger [[id number]]  . ibuprofen (ADVIL,MOTRIN) 200 MG tablet    Sig: Take 1 tablet (200 mg total) by mouth every 8 (eight) hours as needed. With food    Dispense:  30 tablet    Refill:  0    Order Specific Question:   Supervising Provider    Answer:   Cassandria Anger [[id number]]  . lisinopril (PRINIVIL,ZESTRIL) 40 MG tablet    Sig: Take 1 tablet (40 mg total) by mouth daily.    Dispense:  90 tablet    Refill:  2    Order Specific Question:   Supervising Provider    Answer:   Cassandria Anger [[id number]]  . albuterol (PROAIR HFA) 108 (90 Base) MCG/ACT inhaler    Sig:  Inhale 2 puffs into the lungs every 6 (six) hours as needed for wheezing or shortness of breath.    Dispense:  1 Inhaler    Refill:  2    Order Specific Question:   Supervising Provider    Answer:   Cassandria Anger [[id number]]  . budesonide-formoterol (SYMBICORT) 160-4.5 MCG/ACT inhaler    Sig: Inhale 1 puff into the lungs 2 (two) times daily. Rinse mouth after use    Dispense:  1 Inhaler    Refill:  5    Order Specific Question:   Supervising Provider    Answer:   Cassandria Anger [[id number]]    Follow-up: Return in about 6 months (around 07/03/2017) for HTN and left knee pain.  [name], NP

## 2017-01-04 ENCOUNTER — Ambulatory Visit (INDEPENDENT_AMBULATORY_CARE_PROVIDER_SITE_OTHER): Payer: Medicare Other | Admitting: Sports Medicine

## 2017-01-04 ENCOUNTER — Ambulatory Visit: Payer: Self-pay

## 2017-01-04 ENCOUNTER — Ambulatory Visit (INDEPENDENT_AMBULATORY_CARE_PROVIDER_SITE_OTHER): Payer: Medicare Other

## 2017-01-04 ENCOUNTER — Encounter: Payer: Self-pay | Admitting: Sports Medicine

## 2017-01-04 VITALS — BP 130/98 | HR 98 | Ht 61.0 in | Wt 225.4 lb

## 2017-01-04 DIAGNOSIS — G8929 Other chronic pain: Secondary | ICD-10-CM

## 2017-01-04 DIAGNOSIS — M1712 Unilateral primary osteoarthritis, left knee: Secondary | ICD-10-CM | POA: Diagnosis not present

## 2017-01-04 DIAGNOSIS — M25561 Pain in right knee: Secondary | ICD-10-CM | POA: Diagnosis not present

## 2017-01-04 DIAGNOSIS — M25562 Pain in left knee: Secondary | ICD-10-CM | POA: Diagnosis not present

## 2017-01-04 DIAGNOSIS — M170 Bilateral primary osteoarthritis of knee: Secondary | ICD-10-CM | POA: Diagnosis not present

## 2017-01-04 DIAGNOSIS — M1711 Unilateral primary osteoarthritis, right knee: Secondary | ICD-10-CM | POA: Diagnosis not present

## 2017-01-04 NOTE — Patient Instructions (Addendum)
I recommend you obtained a compression sleeve to help with your joint problems. There are many options on the market however I recommend obtaining a full knee Body Helix compression sleeve.  You can find information (including how to appropriate measure yourself for sizing) can be found at www.Body http://www.lambert.com/.  Many of these products are health savings account (HSA) eligible.   You can use the compression sleeve at any time throughout the day but is most important to use while being active as well as for 2 hours post-activity.   It is appropriate to ice following activity with the compression sleeve in place.     You had an injection today.  Things to be aware of after injection are listed below: . You may experience no significant improvement or even a slight worsening in your symptoms during the first 24 to 48 hours.  After that we expect your symptoms to improve gradually over the next 2 weeks for the medicine to have its maximal effect.  You should continue to have improvement out to 6 weeks after your injection. . Dr. Paulla Fore recommends icing the site of the injection for 20 minutes  1-2 times the day of your injection . You may shower but no swimming, tub bath or Jacuzzi for 24 hours. . If your bandage falls off this does not need to be replaced.  It is appropriate to remove the bandage after 4 hours. . You may resume light activities as tolerated unless otherwise directed per Dr. Paulla Fore during your visit  POSSIBLE STEROID SIDE EFFECTS:  Side effects from injectable steroids tend to be less than when taken orally however you may experience some of the symptoms listed below.  If experienced these should only last for a short period of time. Change in menstrual flow  Edema (swelling)  Increased appetite Skin flushing (redness)  Skin rash/acne  Thrush (oral) Yeast vaginitis    Increased sweating  Depression Increased blood glucose levels Cramping and leg/calf  Euphoria (feeling happy)  POSSIBLE  PROCEDURE SIDE EFFECTS: The side effects of the injection are usually fairly minimal however if you may experience some of the following side effects that are usually self-limited and will is off on their own.  If you are concerned please feel free to call the office with questions:  Increased numbness or tingling  Nausea or vomiting  Swelling or bruising at the injection site   Please call our office if if you experience any of the following symptoms over the next week as these can be signs of infection:   Fever greater than 100.37F  Significant swelling at the injection site  Significant redness or drainage from the injection site  If after 2 weeks you are continuing to have worsening symptoms please call our office to discuss what the next appropriate actions should be including the potential for a return office visit or other diagnostic testing.

## 2017-01-04 NOTE — Progress Notes (Signed)
OFFICE VISIT NOTE Penny Gibson, [place] at [facility] [phone]  Penny Gibson - 71 y.o. female MRN [id number]  Date of birth: 02-11-1946  Visit Date: 01/04/2017  PCP: Flossie Buffy, NP   Referred by: Flossie Buffy, NP  Jari Sportsman, cma acting as scribe for Dr. Paulla Fore.  SUBJECTIVE:   Chief Complaint  Patient presents with  . New Patient (Initial Visit)  . Left Knee Pain   HPI: As below and per problem based documentation when appropriate.   Penny Gibson presents with Left Knee Pain worsening for 2 weeks now. No injury. C/o stiffness and unable to bear weight. Walking up and down stairs and going from a sitting to standing position triggers a sharp pain. She is taking ibuprofen as needed with minimal relief. Last xray was 02-24-2008. Location of sx is the below the patella, lateral and medial sides. At times she does notice some swelling. Her RT knee does bother some due to bearing more weight on that side. The other day she did notice increased popping noises. Before patients knee pain worsened she was active at the gym.    Review of Systems  Constitutional: Negative for chills, diaphoresis, fever, malaise/fatigue and weight loss.  HENT: Negative.   Eyes: Negative.   Respiratory: Negative.   Cardiovascular: Negative.   Gastrointestinal: Negative.   Genitourinary: Negative.   Musculoskeletal: Positive for joint pain and myalgias. Negative for back pain, falls and neck pain.  Skin: Negative for rash.  Neurological: Negative.  Negative for weakness.  Endo/Heme/Allergies: Negative for environmental allergies and polydipsia. Does not bruise/bleed easily.  Psychiatric/Behavioral: Negative.     Otherwise per HPI.  HISTORY & PERTINENT PRIOR DATA:  No specialty comments available. She reports that she has never smoked. She has never used smokeless tobacco. No results for input(s): HGBA1C, LABURIC in the last 8760  hours. Medications & Allergies reviewed per EMR Patient Active Problem List   Diagnosis Date Noted  . 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 Asthma, mild intermittent 05/28/2016  . Anemia, iron deficiency 07/26/2014  . Other seasonal allergic rhinitis 07/26/2014  . Atypical squamous cells of undetermined significance (ASCUS) on Papanicolaou smear of cervix 01/31/2014  . BMI 40.0-44.9, adult ([place]) 11/15/2013  . Anemia 09/19/2013  . Essential hypertension 09/19/2013  . Generalized osteoarthritis of multiple sites    Past Medical History:  Diagnosis Date  . Anemia   . Arthritis   . Hyperlipidemia   . Hypertension    Family History  Problem Relation Age of Onset  . Heart attack Mother   . Heart disease Mother   . Cancer Sister   . Stroke Maternal Grandmother    Past Surgical History:  Procedure Laterality Date  . BREAST LUMPECTOMY Left 2010  . TUBAL LIGATION     Social History   Occupational History  . Not on file.   Social History Main Topics  . Smoking status: Never Smoker  . Smokeless tobacco: Never Used  . Alcohol use No  . Drug use: No  . Sexual activity: No    OBJECTIVE:  VS:  HT:5\' 1"  (154.9 cm)   WT:225 lb 6.4 oz (102.2 kg)  BMI:42.7    BP:(!) 130/98  HR:98bpm  TEMP: ( )  RESP:97 % EXAM: Findings:  WDWN, NAD, Non-toxic appearing Alert & appropriately interactive Not depressed or anxious appearing No increased work of breathing. Pupils are equal. EOM intact without nystagmus No clubbing or cyanosis of the extremities appreciated  No significant rashes/lesions/ulcerations overlying the examined area. DP & PT pulses 2+/4.  No significant pretibial edema. Sensation intact to light touch in lower extremities.  Bilateral knees: Left knee has a large effusion.  Right knee has minimal synovitis but no effusion.  She has full flexion extension bilaterally.  Ligamentously stable to varus and valgus strain, anterior and posterior drawer.  No pain with McMurray's.   Positive patellar grind left greater than right.  Extensor mechanism is intact.     No results found. ASSESSMENT & PLAN:   Problem List Items Addressed This Visit    Knee osteoarthritis - Primary    Bilateral knee OA, advanced. Aspiration injection of the knee today as below.  General hip abduction and VMO strengthening exercises emphasized.  Can consider knee bracing but will see how she does over the next several weeks.  Follow-up as needed  PROCEDURE NOTE - ULTRASOUND GUIDED NJECTION: Left knee Images were obtained and interpreted by myself, Teresa Coombs, DO  Images have been saved and stored to PACS system. Images obtained on: GE S7 Ultrasound machine  ULTRASOUND FINDINGS:  Small effusion  DESCRIPTION OF PROCEDURE:  The patient's clinical condition is marked by substantial pain and/or significant functional disability. Other conservative therapy has not provided relief, is contraindicated, or not appropriate. There is a reasonable likelihood that injection will significantly improve the patient's pain and/or functional impairment. After discussing the risks, benefits and expected outcomes of the injection and all questions were reviewed and answered, the patient wished to undergo the above named procedure. Verbal consent was obtained. The ultrasound was used to identify the target structure and adjacent neurovascular structures. The skin was then prepped in sterile fashion and the target structure was injected under direct visualization using sterile technique as below: PREP: Alcohol, Ethel Chloride APPROACH: Superiolateral, single injection, 21g 2" needle INJECTATE: 5cc 1% lidocaine, 2 cc 40mg  DepoMedrol ASPIRATE: 25cc yellow serous fluid  DRESSING: Band-Aid and 6" Ace-Wrap  Post procedural instructions including recommending icing and warning signs for infection were reviewed. This procedure was well tolerated and there were no complications.   IMPRESSION: Succesful US  Guided Injection        Other Visit Diagnoses    Chronic pain of right knee       Relevant Orders   DG Knee 1-2 Views Right      Follow-up: Return if symptoms worsen or fail to improve.  No future appointments.   CMA/ATC served as Education administrator during this visit. History, Physical, and Plan performed by medical provider. Documentation and orders reviewed and attested to.      Teresa Coombs, [facility] Physician

## 2017-01-04 NOTE — Assessment & Plan Note (Signed)
Bilateral knee OA, advanced. Aspiration injection of the knee today as below.  General hip abduction and VMO strengthening exercises emphasized.  Can consider knee bracing but will see how she does over the next several weeks.  Follow-up as needed  PROCEDURE NOTE - ULTRASOUND GUIDED NJECTION: Left knee Images were obtained and interpreted by myself, Teresa Coombs, DO  Images have been saved and stored to PACS system. Images obtained on: GE S7 Ultrasound machine  ULTRASOUND FINDINGS:  Small effusion  DESCRIPTION OF PROCEDURE:  The patient's clinical condition is marked by substantial pain and/or significant functional disability. Other conservative therapy has not provided relief, is contraindicated, or not appropriate. There is a reasonable likelihood that injection will significantly improve the patient's pain and/or functional impairment. After discussing the risks, benefits and expected outcomes of the injection and all questions were reviewed and answered, the patient wished to undergo the above named procedure. Verbal consent was obtained. The ultrasound was used to identify the target structure and adjacent neurovascular structures. The skin was then prepped in sterile fashion and the target structure was injected under direct visualization using sterile technique as below: PREP: Alcohol, Ethel Chloride APPROACH: Superiolateral, single injection, 21g 2" needle INJECTATE: 5cc 1% lidocaine, 2 cc 40mg  DepoMedrol ASPIRATE: 25cc yellow serous fluid  DRESSING: Band-Aid and 6" Ace-Wrap  Post procedural instructions including recommending icing and warning signs for infection were reviewed. This procedure was well tolerated and there were no complications.   IMPRESSION: Succesful US Guided Injection

## 2017-04-30 ENCOUNTER — Encounter (HOSPITAL_COMMUNITY): Payer: Self-pay | Admitting: *Deleted

## 2017-04-30 ENCOUNTER — Other Ambulatory Visit: Payer: Self-pay

## 2017-04-30 ENCOUNTER — Emergency Department (HOSPITAL_COMMUNITY)
Admission: EM | Admit: 2017-04-30 | Discharge: 2017-04-30 | Disposition: A | Discharge status: Home / Self Care | Payer: Medicare Other | Attending: Emergency Medicine | Admitting: Emergency Medicine

## 2017-04-30 DIAGNOSIS — J019 Acute sinusitis, unspecified: Secondary | ICD-10-CM | POA: Diagnosis not present

## 2017-04-30 DIAGNOSIS — Z79899 Other long term (current) drug therapy: Secondary | ICD-10-CM | POA: Insufficient documentation

## 2017-04-30 DIAGNOSIS — R0981 Nasal congestion: Secondary | ICD-10-CM | POA: Diagnosis present

## 2017-04-30 DIAGNOSIS — J45909 Unspecified asthma, uncomplicated: Secondary | ICD-10-CM | POA: Diagnosis not present

## 2017-04-30 DIAGNOSIS — I10 Essential (primary) hypertension: Secondary | ICD-10-CM | POA: Diagnosis not present

## 2017-04-30 DIAGNOSIS — J3489 Other specified disorders of nose and nasal sinuses: Secondary | ICD-10-CM

## 2017-04-30 MED ORDER — DOXYCYCLINE HYCLATE 100 MG PO CAPS: 100.0000 mg | ORAL_CAPSULE | Freq: Two times a day (BID) | ORAL | 0 refills | Status: AC

## 2017-04-30 NOTE — ED Notes (Signed)
Pt reports 5/10 head pain d/t sinus congestion x 3 days . Pt is alert and oriented x 4 and is verbally responsive.

## 2017-04-30 NOTE — ED Notes (Signed)
Pt BP rechecked 180/90. Pt reports taking BP medication today but did not take medication yesterday

## 2017-04-30 NOTE — ED Triage Notes (Signed)
Pt reports a sinus infection for the past few days.  Pt reports head pressure on the front of her head.  Pt denies any nausea, fevers, or chills.  Pt hx of asthma and reports using her inhaler.  Pt a/o x 4 and ambulatory. Pt has been taking Zyrtec and tylenol with minimal relief.

## 2017-04-30 NOTE — Discharge Instructions (Signed)
Continue to stay well-hydrated. Continue to alternate between Tylenol and Ibuprofen for pain or fever. Use netipot and home nasonex (mometasone) nasal spray to help with nasal congestion. May consider over-the-counter Benadryl or other antihistamine to decrease secretions and for help with your symptoms. You may also use Coricidin HBP for congestion, but do NOT use any other decongestants that don't say HBP or "for High Blood Pressure" on them! If you symptoms start worsening, you develop fevers, or you're no better in 4-6 days, then start taking the antibiotic; if you're improving then you DO NOT need to take the antibiotic. Follow up with your primary care doctor in 5-7 days for recheck of ongoing symptoms. Return to emergency department for emergent changing or worsening of symptoms.

## 2017-04-30 NOTE — ED Provider Notes (Signed)
[facility] DEPT Provider Note   CSN: [id number] Arrival date & time: 04/30/17  1103     History   Chief Complaint No chief complaint on file.   HPI Penny Gibson is a 71 y.o. female with a PMHx of HTN, HLD, anemia, arthritis, and seasonal allergic rhinitis, who presents to the ED with complaints of sinus congestion and pressure x3 days.  Patient states that she has had sinus congestion and clear rhinorrhea for about 3 days, and has noticed some postnasal drainage.  She complains of 8/10 constant pressure-like pain in the front of her face and sinuses, nonradiating, worse with palpation of the sinuses and laying down, and slightly improved with ibuprofen although unrelieved with Zyrtec and Nasonex.  Patient states that this happens about once a year when she gets a sinus infection.  She has not been around anybody that she knows of that has been sick.  She denies any vision changes, lightheadedness, ear pain or drainage, sore throat, cough, fevers, chills, CP, SOB, abd pain, N/V/D/C, hematuria, dysuria, myalgias, arthralgias, numbness, tingling, focal weakness, or any other complaints at this time. Of note, she states she always gets high blood pressure when she comes to the doctor; took her BP meds today.    The history is provided by the patient and medical records. No language interpreter was used.  URI   This is a new problem. The current episode started 2 days ago. The problem has not changed since onset.There has been no fever. Associated symptoms include congestion, rhinorrhea and sinus pain. Pertinent negatives include no chest pain, no abdominal pain, no diarrhea, no nausea, no vomiting, no dysuria, no ear pain, no sore throat and no cough. She has tried other medications (nasonex spray and zyrtec and ibuprofen) for the symptoms. The treatment provided mild relief.    Past Medical History:  Diagnosis Date  . Anemia   . Arthritis   .  Hyperlipidemia   . Hypertension     Patient Active Problem List   Diagnosis Date Noted  . 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 Asthma, mild intermittent 05/28/2016  . Anemia, iron deficiency 07/26/2014  . Other seasonal allergic rhinitis 07/26/2014  . Atypical squamous cells of undetermined significance (ASCUS) on Papanicolaou smear of cervix 01/31/2014  . BMI 40.0-44.9, adult ([place]) 11/15/2013  . Anemia 09/19/2013  . Essential hypertension 09/19/2013  . Generalized osteoarthritis of multiple sites     Past Surgical History:  Procedure Laterality Date  . BREAST LUMPECTOMY Left 2010  . TUBAL LIGATION      OB History    No data available       Home Medications    Prior to Admission medications   Medication Sig Start Date End Date Taking? Authorizing Provider  acetaminophen (TYLENOL) 500 MG tablet Take 500 mg by mouth every 6 (six) hours as needed.    [provider]  albuterol (PROAIR HFA) 108 (90 Base) MCG/ACT inhaler Inhale 2 puffs into the lungs every 6 (six) hours as needed for wheezing or shortness of breath. 12/31/16   [name], [name], NP  amLODipine (NORVASC) 2.5 MG tablet Take 1 tablet (2.5 mg total) by mouth at bedtime. 12/31/16   [name], [name], NP  aspirin 81 MG tablet Take 81 mg by mouth daily.    [provider]  budesonide-formoterol (SYMBICORT) 160-4.5 MCG/ACT inhaler Inhale 1 puff into the lungs 2 (two) times daily. Rinse mouth after use 12/31/16   [name], [name], NP  cetirizine (  ZYRTEC) 10 MG tablet Take 1 tablet (10 mg total) by mouth daily. 09/24/16   [name], [name], NP  ibuprofen (ADVIL,MOTRIN) 200 MG tablet Take 1 tablet (200 mg total) by mouth every 8 (eight) hours as needed. With food 12/31/16   [name], [name], NP  Iron-Vitamin C (VITRON-C PO) Take by mouth. Take one tablet daily    [provider]  lisinopril (PRINIVIL,ZESTRIL) 40 MG tablet Take 1 tablet (40 mg total) by mouth daily. 12/31/16   [name], [name],  NP  mometasone (NASONEX) 50 MCG/ACT nasal spray Place 2 sprays into the nose daily. 12/31/16   [name], [name], NP    Family History Family History  Problem Relation Age of Onset  . Heart attack Mother   . Heart disease Mother   . Cancer Sister   . Stroke Maternal Grandmother     Social History Social History   Tobacco Use  . Smoking status: Never Smoker  . Smokeless tobacco: Never Used  Substance Use Topics  . Alcohol use: No  . Drug use: No     Allergies   Penicillins   Review of Systems Review of Systems  Constitutional: Negative for chills and fever.  HENT: Positive for congestion, postnasal drip, rhinorrhea, sinus pressure and sinus pain. Negative for ear discharge, ear pain and sore throat.   Eyes: Negative for visual disturbance.  Respiratory: Negative for cough and shortness of breath.   Cardiovascular: Negative for chest pain.  Gastrointestinal: Negative for abdominal pain, constipation, diarrhea, nausea and vomiting.  Genitourinary: Negative for dysuria and hematuria.  Musculoskeletal: Negative for arthralgias and myalgias.  Skin: Negative for color change.  Allergic/Immunologic: Negative for immunocompromised state.  Neurological: Negative for weakness, light-headedness and numbness.  Psychiatric/Behavioral: Negative for confusion.   All other systems reviewed and are negative for acute change except as noted in the HPI.    Physical Exam Updated Vital Signs BP (!) 170/98 (BP Location: Right Arm)   Pulse 84   Temp 98 F (36.7 C) (Oral)   Resp 20   Ht 5\' 2"  (1.575 m)   Wt 101.6 kg (224 lb)   SpO2 97%   BMI 40.97 kg/m   Physical Exam  Constitutional: She is oriented to person, place, and time. Vital signs are normal. She appears well-developed and well-nourished.  Non-toxic appearance. No distress.  Afebrile, nontoxic, NAD, HTN noted but similar to outpatient visits  HENT:  Head: Normocephalic and atraumatic.  Right Ear: Hearing, tympanic  membrane, external ear and ear canal normal.  Left Ear: Hearing, tympanic membrane, external ear and ear canal normal.  Nose: Mucosal edema and rhinorrhea present. Right sinus exhibits maxillary sinus tenderness and frontal sinus tenderness. Left sinus exhibits maxillary sinus tenderness and frontal sinus tenderness.  Mouth/Throat: Uvula is midline, oropharynx is clear and moist and mucous membranes are normal. No trismus in the jaw. No uvula swelling. Tonsils are 0 on the right. Tonsils are 0 on the left. No tonsillar exudate.  Ears are clear bilaterally. Nose mildly congested with slight clear rhinorrhea, sinuses diffusely TTP. Oropharynx clear and moist, without uvular swelling or deviation, no trismus or drooling, no tonsillar swelling or erythema, no exudates.    Eyes: Conjunctivae and EOM are normal. Pupils are equal, round, and reactive to light. Right eye exhibits no discharge. Left eye exhibits no discharge.  PERRL, EOMI, no nystagmus  Neck: Normal range of motion. Neck supple. No spinous process tenderness and no muscular tenderness present. No neck rigidity. Normal range of  motion present.  FROM intact without spinous process TTP, no bony stepoffs or deformities, no paraspinous muscle TTP or muscle spasms. No rigidity or meningeal signs. No bruising or swelling.   Cardiovascular: Normal rate and intact distal pulses.  Pulmonary/Chest: Effort normal. No respiratory distress.  Abdominal: Normal appearance. She exhibits no distension.  Musculoskeletal: Normal range of motion.  MAE x4 Strength and sensation grossly intact in all extremities Distal pulses intact Gait steady  Neurological: She is alert and oriented to person, place, and time. She has normal strength. No cranial nerve deficit or sensory deficit. Coordination and gait normal. GCS eye subscore is 4. GCS verbal subscore is 5. GCS motor subscore is 6.  CN 2-12 grossly intact A&O x4 GCS 15 Sensation and strength intact Gait  nonataxic including with tandem walking Coordination with finger-to-nose WNL Neg pronator drift   Skin: Skin is warm, dry and intact. No rash noted.  Psychiatric: She has a normal mood and affect. Her behavior is normal.  Nursing note and vitals reviewed.    ED Treatments / Results  Labs (all labs ordered are listed, but only abnormal results are displayed) Labs Reviewed - No data to display  EKG  EKG Interpretation None       Radiology No results found.  Procedures Procedures (including critical care time)  Medications Ordered in ED Medications - No data to display   Initial Impression / Assessment and Plan / ED Course  I have reviewed the triage vital signs and the nursing notes.  Pertinent labs & imaging results that were available during my care of the patient were reviewed by me and considered in my medical decision making (see chart for details).     71 y.o. female here with sinus pressure and congestion x3 days. On exam, b/l nasal congestion and slight rhinorrhea, sinus TTP diffusely, no focal neuro deficits; slightly HTN but pt states she gets white coat HTN, similar to outpt visits. Likely sinusitis. Advised that these are usually viral or seasonal allergic in nature, and discussed symptomatic care; however given safety script for abx, if symptoms worsen or don't improve in given time frame then she can start the abx, otherwise don't start it. Discussed sinus rinse, nasal spray, and coricidin HBP for symptomatic relief. Tylenol/motrin also advised. Advised f/up with PCP in 1wk for recheck. I explained the diagnosis and have given explicit precautions to return to the ER including for any other new or worsening symptoms. The patient understands and accepts the medical plan as it's been dictated and I have answered their questions. Discharge instructions concerning home care and prescriptions have been given. The patient is STABLE and is discharged to home in good  condition.    Final Clinical Impressions(s) / ED Diagnoses   Final diagnoses:  Acute sinusitis, recurrence not specified, unspecified location  Sinus pressure  Essential hypertension    ED Discharge Orders        Ordered    doxycycline (VIBRAMYCIN) 100 MG capsule  2 times daily     04/30/17 [address], [place], [place] 04/30/17 [place], MD 04/30/17 1524

## 2017-05-24 ENCOUNTER — Telehealth: Payer: Self-pay

## 2017-05-24 NOTE — Telephone Encounter (Signed)
Pt is aware.  

## 2017-05-24 NOTE — Telephone Encounter (Signed)
Copied from [place]. Topic: General - Other >> May 24, 2017  3:42 PM Marin Olp L wrote: Reason for CRM: Patient has cold symptoms but lacks transportation to come in for an appt. Would like something called into pharm.

## 2017-05-24 NOTE — Telephone Encounter (Signed)
Without an office visit I will recommend use of robitussin or delsym for cough. These are both OTC meds.

## 2017-05-24 NOTE — Telephone Encounter (Signed)
Spoke with pt, she has no way of getting here at all. Pt stated she has been coughing and chest congestion for 2 days now. She is concern because she has hx of bronchitis and high BP. Please advise.

## 2017-05-27 ENCOUNTER — Encounter (HOSPITAL_COMMUNITY): Payer: Self-pay

## 2017-05-27 ENCOUNTER — Ambulatory Visit (INDEPENDENT_AMBULATORY_CARE_PROVIDER_SITE_OTHER): Payer: Medicare Other

## 2017-05-27 ENCOUNTER — Ambulatory Visit (HOSPITAL_COMMUNITY)
Admission: EM | Admit: 2017-05-27 | Discharge: 2017-05-27 | Disposition: A | Discharge status: Home / Self Care | Payer: Medicare Other | Attending: Internal Medicine | Admitting: Internal Medicine

## 2017-05-27 ENCOUNTER — Other Ambulatory Visit: Payer: Self-pay

## 2017-05-27 DIAGNOSIS — J069 Acute upper respiratory infection, unspecified: Secondary | ICD-10-CM | POA: Diagnosis not present

## 2017-05-27 DIAGNOSIS — R05 Cough: Secondary | ICD-10-CM | POA: Diagnosis not present

## 2017-05-27 DIAGNOSIS — J4521 Mild intermittent asthma with (acute) exacerbation: Secondary | ICD-10-CM

## 2017-05-27 DIAGNOSIS — R062 Wheezing: Secondary | ICD-10-CM | POA: Diagnosis not present

## 2017-05-27 MED ORDER — PREDNISONE 20 MG PO TABS: 40.0000 mg | ORAL_TABLET | Freq: Every day | ORAL | 0 refills | Status: AC

## 2017-05-27 MED ORDER — IPRATROPIUM-ALBUTEROL 0.5-2.5 (3) MG/3ML IN SOLN
RESPIRATORY_TRACT | Status: AC
  Filled 2017-05-27: qty 3

## 2017-05-27 MED ORDER — METHYLPREDNISOLONE SODIUM SUCC 125 MG IJ SOLR
80.0000 mg | Freq: Once | INTRAMUSCULAR | Status: AC
  Administered 2017-05-27: 80 mg via INTRAMUSCULAR

## 2017-05-27 MED ORDER — IPRATROPIUM-ALBUTEROL 0.5-2.5 (3) MG/3ML IN SOLN
3.0000 mL | Freq: Once | RESPIRATORY_TRACT | Status: AC
  Administered 2017-05-27: 3 mL via RESPIRATORY_TRACT

## 2017-05-27 MED ORDER — METHYLPREDNISOLONE SODIUM SUCC 125 MG IJ SOLR
INTRAMUSCULAR | Status: AC
  Filled 2017-05-27: qty 2

## 2017-05-27 MED ORDER — AZITHROMYCIN 250 MG PO TABS: ORAL_TABLET | ORAL | 0 refills | Status: DC

## 2017-05-27 MED ORDER — SODIUM CHLORIDE 0.9 % IN NEBU
INHALATION_SOLUTION | RESPIRATORY_TRACT | Status: AC
  Filled 2017-05-27: qty 3

## 2017-05-27 NOTE — ED Triage Notes (Signed)
Patient presents to [facility] fro cough x2 days, pt is experiencing productive cough, chills, nasal drainage, and congestion, pt has taken OTC medications but has no relief

## 2017-05-27 NOTE — ED Notes (Signed)
Patient receiving a breathing treatment prior to CXR

## 2017-05-27 NOTE — ED Notes (Signed)
Patient discharged by provider.

## 2017-05-27 NOTE — ED Provider Notes (Signed)
[place]    CSN: [id number] Arrival date & time: 05/27/17  1748     History   Chief Complaint Chief Complaint  Patient presents with  . Cough    HPI Penny Gibson is a 71 y.o. female.   Henslee presents with complaints of cough and shortness of breath with nasal congestion which started approximately 3-4 days ago. Without known fevers. Has a hard time sleeping. Has tried using her inhaler which has not helped. Has a history of asthma, does not smoke. Sore throat originally which has improved. Ears feel full. No known ill contacts. Has been taking OTC medications including nasal saline and robitussin and she has had minimal relief. Feels body aches with cough. Minimally productive.    ROS per HPI.        Past Medical History:  Diagnosis Date  . Anemia   . Arthritis   . Hyperlipidemia   . Hypertension     Patient Active Problem List   Diagnosis Date Noted  . 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 Asthma, mild intermittent 05/28/2016  . Anemia, iron deficiency 07/26/2014  . Other seasonal allergic rhinitis 07/26/2014  . Atypical squamous cells of undetermined significance (ASCUS) on Papanicolaou smear of cervix 01/31/2014  . BMI 40.0-44.9, adult ([place]) 11/15/2013  . Anemia 09/19/2013  . Essential hypertension 09/19/2013  . Generalized osteoarthritis of multiple sites     Past Surgical History:  Procedure Laterality Date  . BREAST LUMPECTOMY Left 2010  . TUBAL LIGATION      OB History    No data available       Home Medications    Prior to Admission medications   Medication Sig Start Date End Date Taking? Authorizing Provider  albuterol (PROAIR HFA) 108 (90 Base) MCG/ACT inhaler Inhale 2 puffs into the lungs every 6 (six) hours as needed for wheezing or shortness of breath. 12/31/16  Yes [name], [name], NP  aspirin 81 MG tablet Take 81 mg by mouth daily.   Yes [provider]  cetirizine (ZYRTEC) 10 MG tablet Take 1  tablet (10 mg total) by mouth daily. 09/24/16  Yes [name], [name], NP  ibuprofen (ADVIL,MOTRIN) 200 MG tablet Take 1 tablet (200 mg total) by mouth every 8 (eight) hours as needed. With food 12/31/16  Yes [name], [name], NP  lisinopril (PRINIVIL,ZESTRIL) 40 MG tablet Take 1 tablet (40 mg total) by mouth daily. 12/31/16  Yes [name], [name], NP  mometasone (NASONEX) 50 MCG/ACT nasal spray Place 2 sprays into the nose daily. 12/31/16  Yes [name], [name], NP  acetaminophen (TYLENOL) 500 MG tablet Take 500 mg by mouth every 6 (six) hours as needed.    [provider]  amLODipine (NORVASC) 2.5 MG tablet Take 1 tablet (2.5 mg total) by mouth at bedtime. 12/31/16   [name], [name], NP  azithromycin (ZITHROMAX) 250 MG tablet Take first 2 tablets together, then 1 every day until finished. 05/27/17   Zigmund Gottron, NP  budesonide-formoterol (SYMBICORT) 160-4.5 MCG/ACT inhaler Inhale 1 puff into the lungs 2 (two) times daily. Rinse mouth after use 12/31/16   [name], [name], NP  Iron-Vitamin C (VITRON-C PO) Take by mouth. Take one tablet daily    [provider]  predniSONE (DELTASONE) 20 MG tablet Take 2 tablets (40 mg total) by mouth daily with breakfast for 5 days. 05/27/17 06/01/17  Zigmund Gottron, NP    Family History Family History  Problem Relation Age of Onset  . Heart  attack Mother   . Heart disease Mother   . Cancer Sister   . Stroke Maternal Grandmother     Social History Social History   Tobacco Use  . Smoking status: Never Smoker  . Smokeless tobacco: Never Used  Substance Use Topics  . Alcohol use: No  . Drug use: No     Allergies   Penicillins   Review of Systems Review of Systems   Physical Exam Triage Vital Signs ED Triage Vitals [05/27/17 1819]  Enc Vitals Group     BP (!) 166/87     Pulse Rate 91     Resp 17     Temp 98.2 F (36.8 C)     Temp Source Oral     SpO2 96 %     Weight      Height      Head Circumference       Peak Flow      Pain Score      Pain Loc      Pain Edu?      Excl. in [place]?    No data found.  Updated Vital Signs BP (!) 166/87 (BP Location: Left Arm)   Pulse 91   Temp 98.2 F (36.8 C) (Oral)   Resp 17   SpO2 96%   Visual Acuity Right Eye Distance:   Left Eye Distance:   Bilateral Distance:    Right Eye Near:   Left Eye Near:    Bilateral Near:     Physical Exam  Constitutional: She is oriented to person, place, and time. She appears well-developed and well-nourished. No distress.  HENT:  Head: Normocephalic and atraumatic.  Right Ear: Tympanic membrane, external ear and ear canal normal.  Left Ear: Tympanic membrane, external ear and ear canal normal.  Nose: Nose normal.  Mouth/Throat: Uvula is midline, oropharynx is clear and moist and mucous membranes are normal. No tonsillar exudate.  Eyes: Conjunctivae and EOM are normal. Pupils are equal, round, and reactive to light.  Neck: Normal range of motion.  Cardiovascular: Normal rate, regular rhythm and normal heart sounds.  Pulmonary/Chest: Effort normal. She has wheezes.  Wheezes throughout; strong moist cough noted  Lymphadenopathy:    She has no cervical adenopathy.  Neurological: She is alert and oriented to person, place, and time.  Skin: Skin is warm and dry.     UC Treatments / Results  Labs (all labs ordered are listed, but only abnormal results are displayed) Labs Reviewed - No data to display  EKG  EKG Interpretation None       Radiology Dg Chest 2 View  Result Date: 05/27/2017 CLINICAL DATA:  Feeling unwell for 2 days, shortness of breath, low-grade fever, cough. History of bronchitis. EXAM: CHEST  2 VIEW COMPARISON:  Chest radiograph August 16, 2010 FINDINGS: The cardiac silhouette is moderately enlarged, increased from prior imaging. Mediastinal silhouette is nonsuspicious. Pulmonary vascular congestion and interstitial prominence. No pleural effusion or focal consolidation. No new  pneumothorax. Old RIGHT displaced mid clavicle fracture. Soft tissue planes and included osseous structure nonsuspicious. IMPRESSION: Increasing cardiomegaly. Interstitial prominence concerning for pulmonary edema and/or bronchitis without focal consolidation. Electronically Signed   By: Elon Alas M.D.   On: 05/27/2017 19:33    Procedures Procedures (including critical care time)  Medications Ordered in UC Medications  ipratropium-albuterol (DUONEB) 0.5-2.5 (3) MG/3ML nebulizer solution 3 mL (3 mLs Nebulization Given 05/27/17 1900)  methylPREDNISolone sodium succinate (SOLU-MEDROL) 125 mg/2 mL injection 80 mg (80 mg Intramuscular  Given 05/27/17 1935)     Initial Impression / Assessment and Plan / UC Course  I have reviewed the triage vital signs and the nursing notes.  Pertinent labs & imaging results that were available during my care of the patient were reviewed by me and considered in my medical decision making (see chart for details).     Patient without increased swelling, with congestion symptoms present, history of asthma, xray likely consistent with bronchitis. Provided azithromycin for antibiotic coverage as well. Medrol given in clinic today and script for prednisone to be started tomorrow. Return precautions provided. Follow up with PCP next week. Patient verbalized understanding and agreeable to plan.    Final Clinical Impressions(s) / UC Diagnoses   Final diagnoses:  Mild intermittent asthma with exacerbation  Upper respiratory tract infection, unspecified type    ED Discharge Orders        Ordered    azithromycin (ZITHROMAX) 250 MG tablet     05/27/17 1947    predniSONE (DELTASONE) 20 MG tablet  Daily with breakfast     05/27/17 1947       Controlled Substance Prescriptions [place] Controlled Substance Registry consulted? Not Applicable   Zigmund Gottron, NP 05/27/17 2000

## 2017-05-27 NOTE — Discharge Instructions (Signed)
Please start antibiotics tonight. Course of steroids, prednisone, to be started tomorrow as we have given you injection of steroid tonight. Push fluids to ensure adequate hydration and keep secretions thin.  Continue with use of your inhaler as needed. May continue with over the counter medications as previously prescribed for symptoms Please follow up with your primary care provider early next week for recheck or return to urgent care if your symptoms do not improve in 3 days or if worsening of symptoms.

## 2017-05-30 ENCOUNTER — Encounter: Payer: Medicare Other | Admitting: Nurse Practitioner

## 2017-05-31 ENCOUNTER — Encounter: Payer: Medicare Other | Admitting: Nurse Practitioner

## 2017-06-06 ENCOUNTER — Encounter: Payer: Medicare Other | Admitting: Nurse Practitioner

## 2017-06-07 ENCOUNTER — Ambulatory Visit (INDEPENDENT_AMBULATORY_CARE_PROVIDER_SITE_OTHER): Payer: Medicare Other | Admitting: Nurse Practitioner

## 2017-06-07 ENCOUNTER — Encounter: Payer: Self-pay | Admitting: Nurse Practitioner

## 2017-06-07 VITALS — BP 132/92 | HR 95 | Temp 98.0°F | Ht 62.0 in | Wt 218.0 lb

## 2017-06-07 DIAGNOSIS — D509 Iron deficiency anemia, unspecified: Secondary | ICD-10-CM

## 2017-06-07 DIAGNOSIS — I10 Essential (primary) hypertension: Secondary | ICD-10-CM | POA: Diagnosis not present

## 2017-06-07 DIAGNOSIS — R0982 Postnasal drip: Secondary | ICD-10-CM | POA: Diagnosis not present

## 2017-06-07 DIAGNOSIS — Z1231 Encounter for screening mammogram for malignant neoplasm of breast: Secondary | ICD-10-CM

## 2017-06-07 DIAGNOSIS — J4521 Mild intermittent asthma with (acute) exacerbation: Secondary | ICD-10-CM | POA: Diagnosis not present

## 2017-06-07 DIAGNOSIS — R87610 Atypical squamous cells of undetermined significance on cytologic smear of cervix (ASC-US): Secondary | ICD-10-CM | POA: Diagnosis not present

## 2017-06-07 LAB — BASIC METABOLIC PANEL
BUN: 9 mg/dL (ref 6–23)
CALCIUM: 9.2 mg/dL (ref 8.4–10.5)
CHLORIDE: 99 meq/L (ref 96–112)
CO2: 29 mEq/L (ref 19–32)
CREATININE: 0.73 mg/dL (ref 0.40–1.20)
GFR: 101.34 mL/min (ref >60.00)
Glucose, Bld: 96 mg/dL (ref 70–99)
Potassium: 4.1 mEq/L (ref 3.5–5.1)
SODIUM: 140 meq/L (ref 135–145)

## 2017-06-07 LAB — IBC PANEL
Iron: 30 ug/dL — ABNORMAL LOW (ref 42–145)
Saturation Ratios: 6.2 % — ABNORMAL LOW (ref 20.0–50.0)
TRANSFERRIN: 347 mg/dL (ref 212.0–360.0)

## 2017-06-07 MED ORDER — BUDESONIDE-FORMOTEROL FUMARATE 160-4.5 MCG/ACT IN AERO: 1.0000 | INHALATION_SPRAY | Freq: Two times a day (BID) | RESPIRATORY_TRACT | 5 refills | Status: DC

## 2017-06-07 MED ORDER — IPRATROPIUM BROMIDE 0.03 % NA SOLN: 2.0000 | Freq: Two times a day (BID) | NASAL | 0 refills | Status: DC

## 2017-06-07 MED ORDER — ALBUTEROL SULFATE HFA 108 (90 BASE) MCG/ACT IN AERS: 2.0000 | INHALATION_SPRAY | Freq: Four times a day (QID) | RESPIRATORY_TRACT | 5 refills | Status: DC | PRN

## 2017-06-07 NOTE — Progress Notes (Signed)
Subjective:  Patient ID: Penny Gibson, female    DOB: 13-Apr-1946  Age: 71 y.o. MRN: [id number]  CC: Follow-up (HTN and asthma --amlodipine made her nervouse)  HPI  Last CPE 09/24/2016. Wants AWV done by [facility] at home.  HTN: Has not using amlodipine due to increased anxiety with medication. Has been using lisinopril and HCTZ. BP Readings from Last 3 Encounters:  06/07/17 (!) 132/92  05/27/17 (!) 166/87  04/30/17 (!) 180/90   Asthma: Persistent wheezing at night in last 2weeks. Nasal congestion x 2weeks. Complete oral abx and prednisone with significant improvement. Current use of symbicort as prescribed. Needs albuterol refill.  Hx of abnormal pap smear: Did not f/up with Gyn as instructed. She want referral to different office. Denies any pelvic pain or vaginal bleeding.  Outpatient Medications Prior to Visit  Medication Sig Dispense Refill  . acetaminophen (TYLENOL) 500 MG tablet Take 500 mg by mouth every 6 (six) hours as needed.    Marland Kitchen aspirin 81 MG tablet Take 81 mg by mouth daily.    . cetirizine (ZYRTEC) 10 MG tablet Take 1 tablet (10 mg total) by mouth daily. 30 tablet 2  . ibuprofen (ADVIL,MOTRIN) 200 MG tablet Take 1 tablet (200 mg total) by mouth every 8 (eight) hours as needed. With food 30 tablet 0  . Iron-Vitamin C (VITRON-C PO) Take by mouth. Take one tablet daily    . lisinopril (PRINIVIL,ZESTRIL) 40 MG tablet Take 1 tablet (40 mg total) by mouth daily. 90 tablet 2  . mometasone (NASONEX) 50 MCG/ACT nasal spray Place 2 sprays into the nose daily. 17 g 12  . albuterol (PROAIR HFA) 108 (90 Base) MCG/ACT inhaler Inhale 2 puffs into the lungs every 6 (six) hours as needed for wheezing or shortness of breath. 1 Inhaler 2  . budesonide-formoterol (SYMBICORT) 160-4.5 MCG/ACT inhaler Inhale 1 puff into the lungs 2 (two) times daily. Rinse mouth after use 1 Inhaler 5  . amLODipine (NORVASC) 2.5 MG tablet Take 1 tablet (2.5 mg total) by mouth at  bedtime. (Patient not taking: Reported on 06/07/2017) 90 tablet 1  . azithromycin (ZITHROMAX) 250 MG tablet Take first 2 tablets together, then 1 every day until finished. (Patient not taking: Reported on 06/07/2017) 6 tablet 0   No facility-administered medications prior to visit.     ROS Review of Systems  Constitutional: Negative.   HENT: Positive for congestion. Negative for sore throat.   Respiratory: Positive for cough, shortness of breath and wheezing.   Cardiovascular: Negative for chest pain and palpitations.  Gastrointestinal: Negative.   Genitourinary: Negative.   Skin: Negative.   Neurological: Negative for dizziness and headaches.  Psychiatric/Behavioral: Negative for depression. The patient is not nervous/anxious and does not have insomnia.      Objective:  BP (!) 132/92   Pulse 95   Temp 98 F (36.7 C)   Ht 5\' 2"  (1.575 m)   Wt 218 lb (98.9 kg)   SpO2 95%   BMI 39.87 kg/m   BP Readings from Last 3 Encounters:  06/07/17 (!) 132/92  05/27/17 (!) 166/87  04/30/17 (!) 180/90    Wt Readings from Last 3 Encounters:  06/07/17 218 lb (98.9 kg)  04/30/17 224 lb (101.6 kg)  01/04/17 225 lb 6.4 oz (102.2 kg)    Physical Exam  Constitutional: She is oriented to person, place, and time. No distress.  HENT:  Right Ear: Tympanic membrane, external ear and ear canal normal.  Left Ear: Tympanic membrane, external  ear and ear canal normal.  Nose: Mucosal edema and rhinorrhea present. Right sinus exhibits no maxillary sinus tenderness and no frontal sinus tenderness. Left sinus exhibits no maxillary sinus tenderness and no frontal sinus tenderness.  Mouth/Throat: Uvula is midline. No trismus in the jaw. Posterior oropharyngeal erythema present. No oropharyngeal exudate or posterior oropharyngeal edema.  Neck: No JVD present.  Cardiovascular: Normal rate, regular rhythm and normal heart sounds.  Pulmonary/Chest: Effort normal and breath sounds normal.  Musculoskeletal:  She exhibits edema.  Trace LE edema (non pitting)  Neurological: She is alert and oriented to person, place, and time.  Psychiatric: She has a normal mood and affect. Her behavior is normal.  Vitals reviewed.   Lab Results  Component Value Date   WBC 9.8 05/28/2016   HGB 11.2 (L) 05/28/2016   HCT 35.5 (L) 05/28/2016   PLT 336.0 05/28/2016   GLUCOSE 96 06/07/2017   CHOL 188 09/24/2016   TRIG 148.0 09/24/2016   HDL 44.90 09/24/2016   LDLCALC 113 (H) 09/24/2016   ALT 24 05/28/2016   AST 28 05/28/2016   NA 140 06/07/2017   K 4.1 06/07/2017   CL 99 06/07/2017   CREATININE 0.73 06/07/2017   BUN 9 06/07/2017   CO2 29 06/07/2017   TSH 1.333 10/18/2007    Dg Chest 2 View  Result Date: 05/27/2017 CLINICAL DATA:  Feeling unwell for 2 days, shortness of breath, low-grade fever, cough. History of bronchitis. EXAM: CHEST  2 VIEW COMPARISON:  Chest radiograph August 16, 2010 FINDINGS: The cardiac silhouette is moderately enlarged, increased from prior imaging. Mediastinal silhouette is nonsuspicious. Pulmonary vascular congestion and interstitial prominence. No pleural effusion or focal consolidation. No new pneumothorax. Old RIGHT displaced mid clavicle fracture. Soft tissue planes and included osseous structure nonsuspicious. IMPRESSION: Increasing cardiomegaly. Interstitial prominence concerning for pulmonary edema and/or bronchitis without focal consolidation. Electronically Signed   By: Elon Alas M.D.   On: 05/27/2017 19:33    Assessment & Plan:   [place] was seen today for follow-up.  Diagnoses and all orders for this visit:  Iron deficiency anemia, unspecified iron deficiency anemia type -     CBC w/Diff -     IBC panel -     CBC w/Diff; Future -     ferrous sulfate 325 (65 FE) MG tablet; Take 1 tablet (325 mg total) by mouth daily with breakfast.  Essential hypertension -     Basic metabolic panel  Mild intermittent asthma with acute exacerbation -     albuterol  (PROAIR HFA) 108 (90 Base) MCG/ACT inhaler; Inhale 2 puffs into the lungs every 6 (six) hours as needed for wheezing or shortness of breath. -     budesonide-formoterol (SYMBICORT) 160-4.5 MCG/ACT inhaler; Inhale 1 puff into the lungs 2 (two) times daily. Rinse mouth after use  Atypical squamous cells of undetermined significance (ASCUS) on Papanicolaou smear of cervix -     Cancel: Ambulatory referral to Gynecology -     Ambulatory referral to Gynecology  Post-nasal drip -     ipratropium (ATROVENT) 0.03 % nasal spray; Place 2 sprays into both nostrils 2 (two) times daily. Do not use for more than 5days.  Breast cancer screening by mammogram -     MM DIGITAL SCREENING BILATERAL; Future   I have discontinued [name] C. Voeltz's azithromycin. I am also having her start on ipratropium and ferrous sulfate. Additionally, I am having her maintain her Iron-Vitamin C (VITRON-C PO), acetaminophen, aspirin, cetirizine, mometasone, amLODipine, ibuprofen, lisinopril,  albuterol, and budesonide-formoterol.  Meds ordered this encounter  Medications  . ipratropium (ATROVENT) 0.03 % nasal spray    Sig: Place 2 sprays into both nostrils 2 (two) times daily. Do not use for more than 5days.    Dispense:  30 mL    Refill:  0    Order Specific Question:   Supervising Provider    Answer:   Lucille Passy [[id number]]  . albuterol (PROAIR HFA) 108 (90 Base) MCG/ACT inhaler    Sig: Inhale 2 puffs into the lungs every 6 (six) hours as needed for wheezing or shortness of breath.    Dispense:  1 Inhaler    Refill:  5    Order Specific Question:   Supervising Provider    Answer:   Lucille Passy [[id number]]  . budesonide-formoterol (SYMBICORT) 160-4.5 MCG/ACT inhaler    Sig: Inhale 1 puff into the lungs 2 (two) times daily. Rinse mouth after use    Dispense:  1 Inhaler    Refill:  5    Order Specific Question:   Supervising Provider    Answer:   Lucille Passy [[id number]]  . ferrous sulfate 325 (65 FE) MG tablet    Sig: Take  1 tablet (325 mg total) by mouth daily with breakfast.    Dispense:  90 tablet    Refill:  3    Order Specific Question:   Supervising Provider    Answer:   Lucille Passy [[id number]]    Follow-up: Return in about 4 months (around 10/06/2017) for CPE (fasting). [name], NP

## 2017-06-07 NOTE — Patient Instructions (Addendum)
Go to lab for blood draw.  Will send Rx after review of lab results.  Use albuterol inhaler as needed for wheezing, shortness of breath, chest tightness and coughing.  Continue symbicort as prescribed.  You need to follow up with GYN about previous abnormal PAP smear.

## 2017-06-08 ENCOUNTER — Telehealth: Payer: Self-pay

## 2017-06-08 NOTE — Telephone Encounter (Signed)
Copied from [place]. Topic: General - Other >> Jun 08, 2017  1:17 PM Lolita Rieger, [place] wrote: Reason for CRM:Pt called and stated that someone from the lab called her and told her that she needed to come back in for a redraw Pt stated that she will be out of town until after the new year.

## 2017-06-08 NOTE — Telephone Encounter (Signed)
See note

## 2017-06-10 MED ORDER — FERROUS SULFATE 325 (65 FE) MG PO TABS: 325.0000 mg | ORAL_TABLET | Freq: Every day | ORAL | 3 refills | Status: DC

## 2017-06-28 ENCOUNTER — Other Ambulatory Visit (INDEPENDENT_AMBULATORY_CARE_PROVIDER_SITE_OTHER): Payer: Medicare Other

## 2017-06-28 DIAGNOSIS — D509 Iron deficiency anemia, unspecified: Secondary | ICD-10-CM

## 2017-06-28 LAB — CBC WITH DIFFERENTIAL/PLATELET
Basophils Absolute: 0.1 10*3/uL (ref 0.0–0.1)
Basophils Relative: 1.1 % (ref 0.0–3.0)
Eosinophils Absolute: 0.3 10*3/uL (ref 0.0–0.7)
Eosinophils Relative: 2.8 % (ref 0.0–5.0)
HEMATOCRIT: 27.4 % — AB (ref 36.0–46.0)
Hemoglobin: 8.3 g/dL — ABNORMAL LOW (ref 12.0–15.0)
LYMPHS ABS: 2.5 10*3/uL (ref 0.7–4.0)
LYMPHS PCT: 26.4 % (ref 12.0–46.0)
MCHC: 30.2 g/dL (ref 30.0–36.0)
MONOS PCT: 5.4 % (ref 3.0–12.0)
Monocytes Absolute: 0.5 10*3/uL (ref 0.1–1.0)
NEUTROS PCT: 64.3 % (ref 43.0–77.0)
Neutro Abs: 6.1 10*3/uL (ref 1.4–7.7)
Platelets: 483 10*3/uL — ABNORMAL HIGH (ref 150.0–400.0)
RBC: 4.04 Mil/uL (ref 3.87–5.11)
RDW: 21.9 % — ABNORMAL HIGH (ref 11.5–15.5)
WBC: 9.5 10*3/uL (ref 4.0–10.5)

## 2017-06-29 MED ORDER — FERROUS SULFATE 325 (65 FE) MG PO TABS: 325.0000 mg | ORAL_TABLET | Freq: Three times a day (TID) | ORAL | 3 refills | Status: DC

## 2017-07-05 ENCOUNTER — Ambulatory Visit: Payer: Self-pay

## 2017-07-05 ENCOUNTER — Telehealth: Payer: Self-pay | Admitting: Nurse Practitioner

## 2017-07-05 NOTE — Telephone Encounter (Signed)
Pt states she already spoke with Sarah,RN. See triage note

## 2017-07-05 NOTE — Telephone Encounter (Signed)
Message on  Answering  Machine    Left for  Patient  To  Call  Back  To  Discuss    Symptoms

## 2017-07-05 NOTE — Telephone Encounter (Signed)
Pt calling stating she is having a rash that developed last week. Pt states that she has tried Cortisone 10 and has had small amount of relief. Pt has had scabies exposure at Christmas. Pt wanting home care. Gave pt information from the CDC about scabies and the phone number to her pharmacy to ask about scabicide.  Reason for Disposition . Hives or itching . Niacin flush, questions about  Answer Assessment - Initial Assessment Questions 1. APPEARANCE of RASH: "Describe the rash." (e.g., spots, blisters, raised areas, skin peeling, scaly)     Non fluid filled bumps, some are scaly 2. SIZE: "How big are the spots?" (e.g., tip of pen, eraser, coin; inches, centimeters)     From slightly larger of tip of a  Pen to eraser 3. LOCATION: "Where is the rash located?"     Hands, arms,legs, few on back and buttocks and 1 on breast 4. COLOR: "What color is the rash?" (Note: It is difficult to assess rash color in people with darker-colored skin. When this situation occurs, simply ask the caller to describe what they see.)     Red bumps 5. ONSET: "When did the rash begin?"    1/8 or 1/9 6. FEVER: "Do you have a fever?" If so, ask: "What is your temperature, how was it measured, and when did it start?"     no 7. ITCHING: "Does the rash itch?" If so, ask: "How bad is the itch?" (Scale 1-10; or mild, moderate, severe)     Itches 7/10 8. CAUSE: "What do you think is causing the rash?"     Pt thinks is eczema 9. NEW MEDICATION: "What new medication are you taking?" (e.g., name of antibiotic) "When did you start taking this medication?".     Was taking night time sleep aid 10. OTHER SYMPTOMS: "Do you have any other symptoms?" (e.g., sore throat, fever, joint pain)       Joint pain 11. PREGNANCY: "Is there any chance you are pregnant?" "When was your last menstrual period?"       n/a  Protocols used: RASH - WIDESPREAD ON DRUGS-A-AH

## 2017-07-05 NOTE — Telephone Encounter (Signed)
Copied from [place] [id number]. Topic: Quick Communication - See Telephone Encounter >> Jul 05, 2017  4:47 PM Vernona Rieger wrote: CRM for notification. See Telephone encounter for:   07/05/17.  Patient called and said she quit taking the ferrous sulfate 325 (65 FE) MG tablet, she said they make her stomach hurt. She wanted to the dr to know & maybe give her something else in place of it.  [place], [place] - [address]

## 2017-07-07 NOTE — Telephone Encounter (Addendum)
Pt requesting to have something called in for ? Scabies. Appt made to be 07/11/17 1030 with Dr Lorayne Marek.

## 2017-07-08 ENCOUNTER — Telehealth: Payer: Self-pay | Admitting: Nurse Practitioner

## 2017-07-08 NOTE — Telephone Encounter (Signed)
Attempted to call patient to schedule Annual Wellness Visit, but patient did not answer. Will try to call patient again at a later time. SF °

## 2017-07-11 ENCOUNTER — Encounter: Payer: Self-pay | Admitting: Nurse Practitioner

## 2017-07-11 ENCOUNTER — Ambulatory Visit (INDEPENDENT_AMBULATORY_CARE_PROVIDER_SITE_OTHER): Payer: Medicare Other | Admitting: Nurse Practitioner

## 2017-07-11 VITALS — BP 130/86 | HR 74 | Temp 97.7°F | Ht 62.0 in | Wt 218.0 lb

## 2017-07-11 DIAGNOSIS — M170 Bilateral primary osteoarthritis of knee: Secondary | ICD-10-CM | POA: Diagnosis not present

## 2017-07-11 DIAGNOSIS — D509 Iron deficiency anemia, unspecified: Secondary | ICD-10-CM | POA: Diagnosis not present

## 2017-07-11 MED ORDER — FERRALET 90 90-1 MG PO TABS: 1.0000 | ORAL_TABLET | Freq: Every day | ORAL | 5 refills | Status: DC

## 2017-07-11 MED ORDER — MELOXICAM 7.5 MG PO TABS: 7.5000 mg | ORAL_TABLET | Freq: Every day | ORAL | 1 refills | Status: DC | PRN

## 2017-07-11 NOTE — Patient Instructions (Addendum)
Please inquire with your local pharmacy about shingle vaccine.  Use knee brace during the day and off at night.  You can also use Biofreeze gel OTC for knee pain.

## 2017-07-11 NOTE — Progress Notes (Signed)
Subjective:  Patient ID: Penny Gibson, female    DOB: 1945/10/06  Age: 72 y.o. MRN: [id number]  CC: Medication Problem (iron pill making her stomach hard and painful (constipation?) request something else/ shingle injection consult?) and Knee Pain (knee pain--)   Anemia  Presents for follow-up visit. Symptoms include abdominal pain. There has been no anorexia, bruising/bleeding easily, confusion, fever, leg swelling, light-headedness, malaise/fatigue, paresthesias, pica or weight loss. Signs of blood loss that are not present include hematemesis, hematochezia, melena and vaginal bleeding. Compliance problems include medication side effects (ABD pain, nausea and constipation).  Side effects of medications include GI discomfort.  Knee Pain   The incident occurred more than 1 week ago. There was no injury mechanism. The pain is present in the left knee and right knee. The quality of the pain is described as aching. The pain has been intermittent since onset. Pertinent negatives include no inability to bear weight, loss of motion, loss of sensation, muscle weakness, numbness or tingling. She reports no foreign bodies present. The symptoms are aggravated by palpation and weight bearing. Treatments tried: intraarticular injection 10/2016 with significant improvement. The treatment provided significant relief.  she does not want to return to sports medicine for joint injection.  Last BM this morning (normal per patient).  Outpatient Medications Prior to Visit  Medication Sig Dispense Refill  . albuterol (PROAIR HFA) 108 (90 Base) MCG/ACT inhaler Inhale 2 puffs into the lungs every 6 (six) hours as needed for wheezing or shortness of breath. 1 Inhaler 5  . amLODipine (NORVASC) 2.5 MG tablet Take 1 tablet (2.5 mg total) by mouth at bedtime. 90 tablet 1  . aspirin 81 MG tablet Take 81 mg by mouth daily.    . budesonide-formoterol (SYMBICORT) 160-4.5 MCG/ACT inhaler Inhale 1 puff into the  lungs 2 (two) times daily. Rinse mouth after use 1 Inhaler 5  . cetirizine (ZYRTEC) 10 MG tablet Take 1 tablet (10 mg total) by mouth daily. 30 tablet 2  . ibuprofen (ADVIL,MOTRIN) 200 MG tablet Take 1 tablet (200 mg total) by mouth every 8 (eight) hours as needed. With food 30 tablet 0  . ipratropium (ATROVENT) 0.03 % nasal spray Place 2 sprays into both nostrils 2 (two) times daily. Do not use for more than 5days. 30 mL 0  . lisinopril (PRINIVIL,ZESTRIL) 40 MG tablet Take 1 tablet (40 mg total) by mouth daily. 90 tablet 2  . mometasone (NASONEX) 50 MCG/ACT nasal spray Place 2 sprays into the nose daily. 17 g 12  . Iron-Vitamin C (VITRON-C PO) Take by mouth. Take one tablet daily    . acetaminophen (TYLENOL) 500 MG tablet Take 500 mg by mouth every 6 (six) hours as needed.    . ferrous sulfate 325 (65 FE) MG tablet Take 1 tablet (325 mg total) by mouth 3 (three) times daily with meals. (Patient not taking: Reported on 07/11/2017) 90 tablet 3   No facility-administered medications prior to visit.     ROS See HPI  Objective:  BP 130/86   Pulse 74   Temp 97.7 F (36.5 C)   Ht 5\' 2"  (1.575 m)   Wt 218 lb (98.9 kg)   SpO2 98%   BMI 39.87 kg/m   BP Readings from Last 3 Encounters:  07/11/17 130/86  06/07/17 (!) 132/92  05/27/17 (!) 166/87    Wt Readings from Last 3 Encounters:  07/11/17 218 lb (98.9 kg)  06/07/17 218 lb (98.9 kg)  04/30/17 224 lb (101.6 kg)  Physical Exam  Constitutional: She is oriented to person, place, and time. No distress.  Cardiovascular: Normal rate.  Pulmonary/Chest: Effort normal.  Musculoskeletal: She exhibits tenderness. She exhibits no edema.  Neurological: She is alert and oriented to person, place, and time.  Skin: Skin is warm and dry. No rash noted. No erythema.  Vitals reviewed.   Lab Results  Component Value Date   WBC 9.5 06/28/2017   HGB 8.3 Repeated and verified X2. (L) 06/28/2017   HCT 27.4 (L) 06/28/2017   PLT 483.0 (H)  06/28/2017   GLUCOSE 96 06/07/2017   CHOL 188 09/24/2016   TRIG 148.0 09/24/2016   HDL 44.90 09/24/2016   LDLCALC 113 (H) 09/24/2016   ALT 24 05/28/2016   AST 28 05/28/2016   NA 140 06/07/2017   K 4.1 06/07/2017   CL 99 06/07/2017   CREATININE 0.73 06/07/2017   BUN 9 06/07/2017   CO2 29 06/07/2017   TSH 1.333 10/18/2007    Dg Chest 2 View  Result Date: 05/27/2017 CLINICAL DATA:  Feeling unwell for 2 days, shortness of breath, low-grade fever, cough. History of bronchitis. EXAM: CHEST  2 VIEW COMPARISON:  Chest radiograph August 16, 2010 FINDINGS: The cardiac silhouette is moderately enlarged, increased from prior imaging. Mediastinal silhouette is nonsuspicious. Pulmonary vascular congestion and interstitial prominence. No pleural effusion or focal consolidation. No new pneumothorax. Old RIGHT displaced mid clavicle fracture. Soft tissue planes and included osseous structure nonsuspicious. IMPRESSION: Increasing cardiomegaly. Interstitial prominence concerning for pulmonary edema and/or bronchitis without focal consolidation. Electronically Signed   By: Elon Alas M.D.   On: 05/27/2017 19:33    Assessment & Plan:   Penny Gibson was seen today for medication problem and knee pain.  Diagnoses and all orders for this visit:  Iron deficiency anemia, unspecified iron deficiency anemia type -     Discontinue: Fe Cbn-Fe Gluc-FA-B12-C-DSS (FERRALET 90) 90-1 MG TABS; Take 1 tablet by mouth daily at 12 noon. With food -     Ambulatory referral to Hematology  Primary osteoarthritis of both knees -     meloxicam (MOBIC) 7.5 MG tablet; Take 1 tablet (7.5 mg total) by mouth daily as needed for pain. With food   I have discontinued [name]'s Iron-Vitamin C (VITRON-C PO), ferrous sulfate, and FERRALET 90. I am also having her start on meloxicam. Additionally, I am having her maintain her acetaminophen, aspirin, cetirizine, mometasone, amLODipine, ibuprofen, lisinopril, ipratropium,  albuterol, and budesonide-formoterol.  Meds ordered this encounter  Medications  . DISCONTD: Fe Cbn-Fe Gluc-FA-B12-C-DSS (FERRALET 90) 90-1 MG TABS    Sig: Take 1 tablet by mouth daily at 12 noon. With food    Dispense:  30 each    Refill:  5    Order Specific Question:   Supervising Provider    Answer:   Lucille Passy [[id number]]  . meloxicam (MOBIC) 7.5 MG tablet    Sig: Take 1 tablet (7.5 mg total) by mouth daily as needed for pain. With food    Dispense:  30 tablet    Refill:  1    Order Specific Question:   Supervising Provider    Answer:   Lucille Passy [[id number]]   Follow-up: No Follow-up on file.  [name], NP

## 2017-07-12 ENCOUNTER — Telehealth: Payer: Self-pay | Admitting: Hematology and Oncology

## 2017-07-12 NOTE — Telephone Encounter (Signed)
Appt has been scheduled for the pt to see Dr. Lebron Conners on 1/29 at 10am. Pt aware to arrive 30 minutes early. Address given to the pt.

## 2017-07-15 ENCOUNTER — Ambulatory Visit
Admission: RE | Admit: 2017-07-15 | Discharge: 2017-07-15 | Disposition: A | Discharge status: Home / Self Care | Payer: Medicare Other | Source: Ambulatory Visit | Attending: Nurse Practitioner | Admitting: Nurse Practitioner

## 2017-07-15 DIAGNOSIS — Z1231 Encounter for screening mammogram for malignant neoplasm of breast: Secondary | ICD-10-CM

## 2017-07-18 ENCOUNTER — Other Ambulatory Visit: Payer: Self-pay | Admitting: Nurse Practitioner

## 2017-07-18 DIAGNOSIS — R928 Other abnormal and inconclusive findings on diagnostic imaging of breast: Secondary | ICD-10-CM

## 2017-07-19 ENCOUNTER — Telehealth: Payer: Self-pay | Admitting: Hematology and Oncology

## 2017-07-19 ENCOUNTER — Encounter: Payer: Self-pay | Admitting: Hematology and Oncology

## 2017-07-19 ENCOUNTER — Inpatient Hospital Stay: Payer: Medicare Other | Attending: Hematology and Oncology | Admitting: Hematology and Oncology

## 2017-07-19 ENCOUNTER — Inpatient Hospital Stay: Payer: Medicare Other

## 2017-07-19 VITALS — BP 175/88 | HR 93 | Temp 97.9°F | Resp 17 | Ht 62.0 in | Wt 217.8 lb

## 2017-07-19 DIAGNOSIS — D509 Iron deficiency anemia, unspecified: Secondary | ICD-10-CM

## 2017-07-19 DIAGNOSIS — Z803 Family history of malignant neoplasm of breast: Secondary | ICD-10-CM | POA: Diagnosis not present

## 2017-07-19 DIAGNOSIS — E785 Hyperlipidemia, unspecified: Secondary | ICD-10-CM | POA: Diagnosis not present

## 2017-07-19 DIAGNOSIS — Z7982 Long term (current) use of aspirin: Secondary | ICD-10-CM | POA: Diagnosis not present

## 2017-07-19 DIAGNOSIS — I10 Essential (primary) hypertension: Secondary | ICD-10-CM

## 2017-07-19 LAB — CBC WITH DIFFERENTIAL (CANCER CENTER ONLY)
Basophils Absolute: 0.1 10*3/uL (ref 0.0–0.1)
Basophils Relative: 1 %
Eosinophils Absolute: 0.1 10*3/uL (ref 0.0–0.5)
Eosinophils Relative: 1 %
HEMATOCRIT: 27.3 % — AB (ref 34.8–46.6)
HEMOGLOBIN: 8.4 g/dL — AB (ref 11.6–15.9)
LYMPHS ABS: 2 10*3/uL (ref 0.9–3.3)
Lymphocytes Relative: 21 %
MCH: 20.3 pg — AB (ref 25.1–34.0)
MCHC: 30.6 g/dL — AB (ref 31.5–36.0)
MCV: 66.4 fL — ABNORMAL LOW (ref 79.5–101.0)
MONO ABS: 0.5 10*3/uL (ref 0.1–0.9)
MONOS PCT: 5 %
NEUTROS ABS: 7 10*3/uL — AB (ref 1.5–6.5)
NEUTROS PCT: 72 %
Platelet Count: 446 10*3/uL — ABNORMAL HIGH (ref 145–400)
RBC: 4.12 MIL/uL (ref 3.70–5.45)
RDW: 22.5 % — ABNORMAL HIGH (ref 11.2–16.1)
WBC Count: 9.7 10*3/uL (ref 3.9–10.3)

## 2017-07-19 LAB — CMP (CANCER CENTER ONLY)
ALK PHOS: 89 U/L (ref 40–150)
ALT: 14 U/L (ref 0–55)
ANION GAP: 9 (ref 3–11)
AST: 17 U/L (ref 5–34)
Albumin: 3.7 g/dL (ref 3.5–5.0)
BILIRUBIN TOTAL: 0.4 mg/dL (ref 0.2–1.2)
BUN: 15 mg/dL (ref 7–26)
CALCIUM: 9.4 mg/dL (ref 8.4–10.4)
CO2: 27 mmol/L (ref 22–29)
Chloride: 105 mmol/L (ref 98–109)
Creatinine: 0.79 mg/dL (ref 0.60–1.10)
GFR, Estimated: >60 mL/min (ref >60)
GLUCOSE: 103 mg/dL (ref 70–140)
Potassium: 4.1 mmol/L (ref 3.3–4.7)
Sodium: 141 mmol/L (ref 136–145)
TOTAL PROTEIN: 8.6 g/dL — AB (ref 6.4–8.3)

## 2017-07-19 LAB — FERRITIN: Ferritin: 9 ng/mL (ref 9–269)

## 2017-07-19 LAB — IRON AND TIBC
IRON: 25 ug/dL — AB (ref 41–142)
SATURATION RATIOS: 6 % — AB (ref 21–57)
TIBC: 414 ug/dL (ref 236–444)
UIBC: 388 ug/dL

## 2017-07-19 LAB — TECHNOLOGIST SMEAR REVIEW

## 2017-07-19 NOTE — Telephone Encounter (Signed)
Gave avs and calendar for february °

## 2017-07-26 ENCOUNTER — Inpatient Hospital Stay: Payer: Medicare Other | Attending: Hematology and Oncology

## 2017-07-26 VITALS — BP 127/68 | HR 81 | Temp 97.9°F | Resp 16

## 2017-07-26 DIAGNOSIS — Z79899 Other long term (current) drug therapy: Secondary | ICD-10-CM | POA: Diagnosis not present

## 2017-07-26 DIAGNOSIS — T8090XA Unspecified complication following infusion and therapeutic injection, initial encounter: Secondary | ICD-10-CM | POA: Insufficient documentation

## 2017-07-26 DIAGNOSIS — X58XXXA Exposure to other specified factors, initial encounter: Secondary | ICD-10-CM | POA: Diagnosis not present

## 2017-07-26 DIAGNOSIS — D509 Iron deficiency anemia, unspecified: Secondary | ICD-10-CM | POA: Insufficient documentation

## 2017-07-26 DIAGNOSIS — I10 Essential (primary) hypertension: Secondary | ICD-10-CM | POA: Insufficient documentation

## 2017-07-26 DIAGNOSIS — E785 Hyperlipidemia, unspecified: Secondary | ICD-10-CM | POA: Insufficient documentation

## 2017-07-26 DIAGNOSIS — Z803 Family history of malignant neoplasm of breast: Secondary | ICD-10-CM | POA: Diagnosis not present

## 2017-07-26 DIAGNOSIS — M810 Age-related osteoporosis without current pathological fracture: Secondary | ICD-10-CM | POA: Insufficient documentation

## 2017-07-26 DIAGNOSIS — M1990 Unspecified osteoarthritis, unspecified site: Secondary | ICD-10-CM | POA: Insufficient documentation

## 2017-07-26 DIAGNOSIS — Z7982 Long term (current) use of aspirin: Secondary | ICD-10-CM | POA: Diagnosis not present

## 2017-07-26 DIAGNOSIS — R5383 Other fatigue: Secondary | ICD-10-CM | POA: Insufficient documentation

## 2017-07-26 MED ORDER — SODIUM CHLORIDE 0.9 % IV SOLN
Freq: Once | INTRAVENOUS | Status: AC
  Administered 2017-07-26: 09:00:00 via INTRAVENOUS

## 2017-07-26 MED ORDER — FERUMOXYTOL INJECTION 510 MG/17 ML
510.0000 mg | Freq: Once | INTRAVENOUS | Status: AC
  Administered 2017-07-26: 510 mg via INTRAVENOUS
  Filled 2017-07-26: qty 17

## 2017-07-26 NOTE — Patient Instructions (Signed)

## 2017-07-26 NOTE — Progress Notes (Signed)
Patient tolerated infusion well. Pt c/o "gas and slight headache". The gas dissapated and she has tylenol for the headache. VSS upon discharge. Discharged to home.

## 2017-08-02 ENCOUNTER — Ambulatory Visit
Admission: RE | Admit: 2017-08-02 | Discharge: 2017-08-02 | Disposition: A | Discharge status: Home / Self Care | Payer: Medicaid Other | Source: Ambulatory Visit | Attending: Nurse Practitioner | Admitting: Nurse Practitioner

## 2017-08-02 DIAGNOSIS — R928 Other abnormal and inconclusive findings on diagnostic imaging of breast: Secondary | ICD-10-CM

## 2017-08-07 NOTE — Progress Notes (Signed)
[facility] New Visit:  Assessment: Iron deficiency anemia 72 y.o. female with microcytic hypochromic anemia progressive since December 2017, consistent with iron deficiency although available lab work is not complete at this time.  Patient could not tolerate ferrous sulfate medication due to gastrointestinal symptoms.  Plan: -Labs today as outlined below. -Ferrous gluconate 324 mg by mouth daily to 3 times per day with vitamin C - Meanwhile, patient will receive 2 infusions of Feraheme starting next week. -Return to clinic in 4 weeks with lab work 2 days prior to assess iron level replacement.  Voice recognition software was used and creation of this note. Despite my best effort at editing the text, some misspelling/errors may have occurred.  Orders Placed This Encounter  Procedures  . CBC with Differential (Cancer Center Only)    Standing Status:   Future    Number of Occurrences:   1    Standing Expiration Date:   07/19/2018  . CMP ([facility] only)    Standing Status:   Future    Number of Occurrences:   1    Standing Expiration Date:   07/19/2018  . Iron and TIBC    Standing Status:   Future    Number of Occurrences:   1    Standing Expiration Date:   07/19/2018  . Ferritin    Standing Status:   Future    Number of Occurrences:   1    Standing Expiration Date:   07/19/2018  . Technologist smear review    Standing Status:   Future    Number of Occurrences:   1    Standing Expiration Date:   07/19/2018  . CBC with Differential (Cancer Center Only)    Standing Status:   Future    Standing Expiration Date:   07/19/2018  . CMP ([facility] only)    Standing Status:   Future    Standing Expiration Date:   07/19/2018  . Iron and TIBC    Standing Status:   Future    Standing Expiration Date:   07/19/2018  . Ferritin    Standing Status:   Future    Standing Expiration Date:   07/19/2018    All questions were answered.  . The patient knows to call the  clinic with any problems, questions or concerns.  This note was electronically signed.    History of Presenting Illness Penny Gibson 72 y.o. presenting to the [facility] for diagnosis of iron deficiency anemia, referred by Leana Gamer, NP.  Patient's past medical history is significant for osteoarthritis, hypertension.  Patient is taking aspirin, ibuprofen, and meloxicam.  Family history is significant for sister with breast cancer at age of 63, no other malignancy, bleeding, or abnormal clotting history in the family.  Patient has been previously prescribed oral iron, but could not tolerate ferrous sulfate due to abdominal pain, nausea, and constipation.  At the present time, she is scheduled to see gastroenterology on 08/08/17 for possible colonoscopy evaluation.  At the present time, patient does acknowledge presence of cravings for ice chip in December, that has resolved.  Patient denies any fever, chills, night sweats.  No epistaxis, gum bleeding, hemoptysis, hematemesis, hematochezia, or melena.  No hematuria, or vaginal bleeding.  Oncological/hematological History: --Labs, 05/28/16: Hgb 11.2, MCV 76.2, MCH     ..., RDW 19.9, Plt 336;  --Labs, 06/28/17: Hgb   8.3, MCV 67.8, MCH 30.2, RDW 21.9, Plt 483; Fe 30, FeSat 6.2%, Transferrin 347;  Medical History: Past Medical History:  Diagnosis Date  . Anemia   . Arthritis   . Hyperlipidemia   . Hypertension     Surgical History: Past Surgical History:  Procedure Laterality Date  . BREAST BIOPSY Left 2010  . BREAST LUMPECTOMY Left 2010  . TUBAL LIGATION      Family History: Family History  Problem Relation Age of Onset  . Heart attack Mother   . Heart disease Mother   . Cancer Sister   . Breast cancer Sister 66  . Stroke Maternal Grandmother     Social History: Social History   Socioeconomic History  . Marital status: Divorced    Spouse name: Not on file  . Number of children: Not on file  . Years of  education: Not on file  . Highest education level: Not on file  Social Needs  . Financial resource strain: Not on file  . Food insecurity - worry: Not on file  . Food insecurity - inability: Not on file  . Transportation needs - medical: Not on file  . Transportation needs - non-medical: Not on file  Occupational History  . Not on file  Tobacco Use  . Smoking status: Never Smoker  . Smokeless tobacco: Never Used  Substance and Sexual Activity  . Alcohol use: No  . Drug use: No  . Sexual activity: No  Other Topics Concern  . Not on file  Social History Narrative  . Not on file    Allergies: Allergies  Allergen Reactions  . Penicillins Anaphylaxis    Medications:  Current Outpatient Medications  Medication Sig Dispense Refill  . albuterol (PROAIR HFA) 108 (90 Base) MCG/ACT inhaler Inhale 2 puffs into the lungs every 6 (six) hours as needed for wheezing or shortness of breath. 1 Inhaler 5  . amLODipine (NORVASC) 2.5 MG tablet Take 1 tablet (2.5 mg total) by mouth at bedtime. 90 tablet 1  . aspirin 81 MG tablet Take 81 mg by mouth daily.    . budesonide-formoterol (SYMBICORT) 160-4.5 MCG/ACT inhaler Inhale 1 puff into the lungs 2 (two) times daily. Rinse mouth after use 1 Inhaler 5  . cetirizine (ZYRTEC) 10 MG tablet Take 1 tablet (10 mg total) by mouth daily. 30 tablet 2  . ibuprofen (ADVIL,MOTRIN) 200 MG tablet Take 1 tablet (200 mg total) by mouth every 8 (eight) hours as needed. With food 30 tablet 0  . ipratropium (ATROVENT) 0.03 % nasal spray Place 2 sprays into both nostrils 2 (two) times daily. Do not use for more than 5days. 30 mL 0  . lisinopril (PRINIVIL,ZESTRIL) 40 MG tablet Take 1 tablet (40 mg total) by mouth daily. 90 tablet 2  . meloxicam (MOBIC) 7.5 MG tablet Take 1 tablet (7.5 mg total) by mouth daily as needed for pain. With food 30 tablet 1  . mometasone (NASONEX) 50 MCG/ACT nasal spray Place 2 sprays into the nose daily. 17 g 12   No current  facility-administered medications for this visit.     Review of Systems: Review of Systems  Constitutional: Positive for fatigue.  All other systems reviewed and are negative.    PHYSICAL EXAMINATION Blood pressure (!) 175/88, pulse 93, temperature 97.9 F (36.6 C), temperature source Oral, resp. rate 17, height 5' 2"  (1.575 m), weight 217 lb 12.8 oz (98.8 kg), SpO2 96 %.  ECOG PERFORMANCE STATUS: 1 - Symptomatic but completely ambulatory  Physical Exam  Constitutional: She is oriented to person, place, and time and well-developed, well-nourished, and in  no distress. No distress.  HENT:  Head: Normocephalic and atraumatic.  Mouth/Throat: Oropharynx is clear and moist. No oropharyngeal exudate.  Eyes: Conjunctivae and EOM are normal. Pupils are equal, round, and reactive to light. No scleral icterus.  Neck: No thyromegaly present.  Cardiovascular: Normal rate, regular rhythm and normal heart sounds.  No murmur heard. Pulmonary/Chest: Effort normal and breath sounds normal. No respiratory distress. She has no wheezes. She has no rales.  Abdominal: Soft. Bowel sounds are normal. She exhibits no distension and no mass. There is no tenderness. There is no guarding.  Musculoskeletal: She exhibits no edema.  Lymphadenopathy:    She has no cervical adenopathy.  Neurological: She is alert and oriented to person, place, and time. She has normal reflexes. No cranial nerve deficit.  Skin: Skin is warm and dry. No rash noted. She is not diaphoretic. No erythema. There is pallor.     LABORATORY DATA: I have personally reviewed the data as listed: Appointment on 07/19/2017  Component Date Value Ref Range Status  . WBC Count 07/19/2017 9.7  3.9 - 10.3 K/uL Final  . RBC 07/19/2017 4.12  3.70 - 5.45 MIL/uL Final  . Hemoglobin 07/19/2017 8.4* 11.6 - 15.9 g/dL Final  . HCT 07/19/2017 27.3* 34.8 - 46.6 % Final  . MCV 07/19/2017 66.4* 79.5 - 101.0 fL Final  . MCH 07/19/2017 20.3* 25.1 - 34.0 pg  Final  . MCHC 07/19/2017 30.6* 31.5 - 36.0 g/dL Final  . RDW 07/19/2017 22.5* 11.2 - 16.1 % Final  . Platelet Count 07/19/2017 446* 145 - 400 K/uL Final  . Neutrophils Relative % 07/19/2017 72  % Final  . Neutro Abs 07/19/2017 7.0* 1.5 - 6.5 K/uL Final  . Lymphocytes Relative 07/19/2017 21  % Final  . Lymphs Abs 07/19/2017 2.0  0.9 - 3.3 K/uL Final  . Monocytes Relative 07/19/2017 5  % Final  . Monocytes Absolute 07/19/2017 0.5  0.1 - 0.9 K/uL Final  . Eosinophils Relative 07/19/2017 1  % Final  . Eosinophils Absolute 07/19/2017 0.1  0.0 - 0.5 K/uL Final  . Basophils Relative 07/19/2017 1  % Final  . Basophils Absolute 07/19/2017 0.1  0.0 - 0.1 K/uL Final   Performed at [facility], [place] [address], [place], [place] [address]  . Sodium 07/19/2017 141  136 - 145 mmol/L Final  . Potassium 07/19/2017 4.1  3.3 - 4.7 mmol/L Final  . Chloride 07/19/2017 105  98 - 109 mmol/L Final  . CO2 07/19/2017 27  22 - 29 mmol/L Final  . Glucose, Bld 07/19/2017 103  70 - 140 mg/dL Final  . BUN 07/19/2017 15  7 - 26 mg/dL Final  . Creatinine 07/19/2017 0.79  0.60 - 1.10 mg/dL Final  . Calcium 07/19/2017 9.4  8.4 - 10.4 mg/dL Final  . Total Protein 07/19/2017 8.6* 6.4 - 8.3 g/dL Final  . Albumin 07/19/2017 3.7  3.5 - 5.0 g/dL Final  . AST 07/19/2017 17  5 - 34 U/L Final  . ALT 07/19/2017 14  0 - 55 U/L Final  . Alkaline Phosphatase 07/19/2017 89  40 - 150 U/L Final  . Total Bilirubin 07/19/2017 0.4  0.2 - 1.2 mg/dL Final  . GFR, Est Non Af Am 07/19/2017 >60  >60 mL/min Final  . GFR, Est AFR Am 07/19/2017 >60  >60 mL/min Final   Comment: (NOTE) The eGFR has been calculated using the CKD EPI equation. This calculation has not been validated in all clinical situations. eGFR's  persistently <60 mL/min signify possible Chronic Kidney Disease.   Georgiann Hahn gap 07/19/2017 9  3 - 11 Final   Performed at [facility], [place] [address], [place], [place] [address]   . Iron 07/19/2017 25* 41 - 142 ug/dL Final  . TIBC 07/19/2017 414  236 - 444 ug/dL Final  . Saturation Ratios 07/19/2017 6* 21 - 57 % Final  . UIBC 07/19/2017 388  ug/dL Final   Performed at [facility], [place] [address], [place], [place] [address]  . Ferritin 07/19/2017 9  9 - 269 ng/mL Final   Performed at [facility], [place] [address], [place], [place] [address]  . Tech Review 07/19/2017 MANUAL DIFF CONSISTENT WITH AUTO DIFF   Final   Comment: MOD TARGET, FEW OVALOS PLT COUNT INCREASED, LARGE AND GIANT PLTS Performed at [facility], [place] [address], [place], [place] [address]          Ardath Sax, MD

## 2017-08-07 NOTE — Assessment & Plan Note (Deleted)
72 y.o. female with microcytic hypochromic anemia progressive since December 2017, consistent with iron deficiency although available lab work is not complete at this time.  Patient could not tolerate ferrous sulfate medication due to gastrointestinal symptoms.  Plan: -Labs today as outlined below. -Ferrous gluconate 324 mg by mouth daily to 3 times per day with vitamin C - Meanwhile, patient will receive 2 infusions of Feraheme starting next week. -Return to clinic in 4 weeks with lab work 2 days prior to assess iron level replacement.

## 2017-08-07 NOTE — Assessment & Plan Note (Signed)
72 y.o. female with microcytic hypochromic anemia progressive since December 2017, consistent with iron deficiency although available lab work is not complete at this time.  Patient could not tolerate ferrous sulfate medication due to gastrointestinal symptoms.  Plan: -Labs today as outlined below. -Ferrous gluconate 324 mg by mouth daily to 3 times per day with vitamin C - Meanwhile, patient will receive 2 infusions of Feraheme starting next week. -Return to clinic in 4 weeks with lab work 2 days prior to assess iron level replacement.

## 2017-08-08 ENCOUNTER — Other Ambulatory Visit (HOSPITAL_COMMUNITY)
Admission: RE | Admit: 2017-08-08 | Discharge: 2017-08-08 | Disposition: A | Discharge status: Home / Self Care | Payer: Medicare Other | Source: Ambulatory Visit | Attending: Obstetrics & Gynecology | Admitting: Obstetrics & Gynecology

## 2017-08-08 ENCOUNTER — Encounter: Payer: Self-pay | Admitting: Obstetrics & Gynecology

## 2017-08-08 ENCOUNTER — Ambulatory Visit (INDEPENDENT_AMBULATORY_CARE_PROVIDER_SITE_OTHER): Payer: Medicare Other | Admitting: Obstetrics & Gynecology

## 2017-08-08 VITALS — BP 149/95 | HR 86 | Ht 62.0 in | Wt 218.0 lb

## 2017-08-08 DIAGNOSIS — B373 Candidiasis of vulva and vagina: Secondary | ICD-10-CM | POA: Diagnosis not present

## 2017-08-08 DIAGNOSIS — R87610 Atypical squamous cells of undetermined significance on cytologic smear of cervix (ASC-US): Secondary | ICD-10-CM | POA: Insufficient documentation

## 2017-08-08 DIAGNOSIS — B3731 Acute candidiasis of vulva and vagina: Secondary | ICD-10-CM

## 2017-08-08 MED ORDER — FLUCONAZOLE 150 MG PO TABS: 150.0000 mg | ORAL_TABLET | Freq: Once | ORAL | 0 refills | Status: AC

## 2017-08-08 NOTE — Patient Instructions (Signed)

## 2017-08-08 NOTE — Progress Notes (Signed)
Subjective:     Penny Gibson is a 72 y.o. female here for a routine exam. G2P2002 LMP age 49 years.  Current complaints: no problems. Pt was not aware of why she was scheduled to be seen.  The referral was for an 'ASCUS pap. Pt reports that she was not aware of an abnormal PAP.    Pt does reports vulvar itching that she though was due to her meds.   Gynecologic History No LMP recorded. Patient is postmenopausal. Contraception: post menopausal status Last Pap: 01/31/2014 DIAGNOSIS: Comment Abnormal    Comment: EPITHELIAL CELL ABNORMALITY. ATYPICAL SQUAMOUS CELLS OF UNDETERMINED SIGNIFICANCE.  Specimen adequacy: Comment   Comment: Satisfactory for evaluation. Endocervical and/or squamous metaplastic cells (endocervical component) are present.  Clinician provided ICD9 Comment   Comment: V76.2 ; Screening for malignant neoplasm of the cervix  Performed by: Comment   Comment: Tiffany Vercher, Cytotechnologist (ASCP)  Electronically signed by: Comment   Comment: Galvin Proffer, MD, Pathologist  PAP Smear Comment .   Pathologist provided ICD9: Comment   Comment: 795.01  Note: Comment    Last mammogram: 08/02/2017. Results were: normal  Obstetric History OB History  Gravida Para Term Preterm AB Living  2 2 2     2   SAB TAB Ectopic Multiple Live Births          2    # Outcome Date GA Lbr Len/2nd Weight Sex Delivery Anes PTL Lv  2 Term 03/27/74    M Vag-Spont   LIV  1 Term 02/21/69    F Vag-Spont   LIV     The following portions of the patient's history were reviewed and updated as appropriate: allergies, current medications, past family history, past medical history, past social history, past surgical history and problem list.  Review of Systems Pertinent items are noted in HPI.    Objective:  BP (!) 149/95   Pulse 86   Ht 5\' 2"  (1.575 m)   Wt 218 lb (98.9 kg)   BMI 39.87 kg/m  General Appearance:    Alert, cooperative, no distress, appears stated age  Head:     Normocephalic, without obvious abnormality, atraumatic  Eyes:    conjunctiva/corneas clear, EOM's intact, both eyes  Ears:    Normal external ear canals, both ears  Nose:   Nares normal, septum midline, mucosa normal, no drainage    or sinus tenderness  Throat:   Lips, mucosa, and tongue normal; teeth and gums normal  Neck:   Supple, symmetrical, trachea midline, no adenopathy;    thyroid:  no enlargement/tenderness/nodules  Back:     Symmetric, no curvature, ROM normal, no CVA tenderness  Lungs:     Clear to auscultation bilaterally, respirations unlabored  Chest Wall:    No tenderness or deformity   Heart:    Regular rate and rhythm, S1 and S2 normal, no murmur, rub   or gallop  Breast Exam:    No tenderness, masses, or nipple abnormality  Abdomen:     Soft, non-tender, bowel sounds active all four quadrants,    no masses, no organomegaly  Genitalia:    Normal female without lesion, discharge or tenderness; labia erythematous and thick with satellite lesion suggestion of yeast.      Extremities:   Extremities normal, atraumatic, no cyanosis or edema  Pulses:   2+ and symmetric all extremities  Skin:   Skin color, texture, turgor normal, no rashes or lesions    Assessment:    Healthy female exam.  H/o ASCUS PAP 2015 with no follow up. No hrHPV done at the time Vulva appear to have changes suggestive of chronic yeast- this is seen often in undiagnosed or poorly controlled DM. Will screen today       Plan:   F/u PAP with hrHPV Vagina swab for BV and candidida screen  F/u in 1 year or sooner prn Lab: HgbA1c Diflucan 150mg  po x 1 now. May repeat in 3 days if sx persist.   Total face-to-face time with patient was 56min.  Greater than 50% was spent in counseling and coordination of care with the patient.    [name], M.D., Cherlynn June

## 2017-08-09 LAB — CYTOLOGY - PAP
Diagnosis: NEGATIVE
HPV (WINDOPATH): NOT DETECTED

## 2017-08-09 LAB — CERVICOVAGINAL ANCILLARY ONLY
Bacterial vaginitis: NEGATIVE
CANDIDA VAGINITIS: NEGATIVE

## 2017-08-09 LAB — HEMOGLOBIN A1C
Est. average glucose Bld gHb Est-mCnc: 117 mg/dL
HEMOGLOBIN A1C: 5.7 % — AB (ref 4.8–5.6)

## 2017-08-16 ENCOUNTER — Inpatient Hospital Stay: Payer: Medicare Other

## 2017-08-16 DIAGNOSIS — D509 Iron deficiency anemia, unspecified: Secondary | ICD-10-CM | POA: Diagnosis not present

## 2017-08-16 LAB — CMP (CANCER CENTER ONLY)
ALBUMIN: 3.5 g/dL (ref 3.5–5.0)
ALT: 14 U/L (ref 0–55)
AST: 16 U/L (ref 5–34)
Alkaline Phosphatase: 91 U/L (ref 40–150)
Anion gap: 11 (ref 3–11)
BUN: 9 mg/dL (ref 7–26)
CHLORIDE: 103 mmol/L (ref 98–109)
CO2: 27 mmol/L (ref 22–29)
CREATININE: 0.77 mg/dL (ref 0.60–1.10)
Calcium: 9.4 mg/dL (ref 8.4–10.4)
GFR, Est AFR Am: >60 mL/min (ref >60)
GFR, Estimated: >60 mL/min (ref >60)
Glucose, Bld: 99 mg/dL (ref 70–140)
POTASSIUM: 3.7 mmol/L (ref 3.5–5.1)
SODIUM: 141 mmol/L (ref 136–145)
Total Bilirubin: 0.4 mg/dL (ref 0.2–1.2)
Total Protein: 8.1 g/dL (ref 6.4–8.3)

## 2017-08-16 LAB — CBC WITH DIFFERENTIAL (CANCER CENTER ONLY)
Basophils Absolute: 0 10*3/uL (ref 0.0–0.1)
Basophils Relative: 0 %
EOS ABS: 0.2 10*3/uL (ref 0.0–0.5)
Eosinophils Relative: 2 %
HCT: 31.5 % — ABNORMAL LOW (ref 34.8–46.6)
HEMOGLOBIN: 9.3 g/dL — AB (ref 11.6–15.9)
LYMPHS ABS: 2.1 10*3/uL (ref 0.9–3.3)
LYMPHS PCT: 25 %
MCH: 22.9 pg — AB (ref 25.1–34.0)
MCHC: 29.5 g/dL — AB (ref 31.5–36.0)
MCV: 77.4 fL — AB (ref 79.5–101.0)
MONOS PCT: 7 %
Monocytes Absolute: 0.6 10*3/uL (ref 0.1–0.9)
Neutro Abs: 5.5 10*3/uL (ref 1.5–6.5)
Neutrophils Relative %: 66 %
Platelet Count: 337 10*3/uL (ref 145–400)
RBC: 4.07 MIL/uL (ref 3.70–5.45)
RDW: 26.9 % — ABNORMAL HIGH (ref 11.2–14.5)
WBC Count: 8.3 10*3/uL (ref 3.9–10.3)

## 2017-08-16 LAB — IRON AND TIBC
Iron: 32 ug/dL — ABNORMAL LOW (ref 41–142)
SATURATION RATIOS: 10 % — AB (ref 21–57)
TIBC: 305 ug/dL (ref 236–444)
UIBC: 273 ug/dL

## 2017-08-16 LAB — FERRITIN: Ferritin: 92 ng/mL (ref 9–269)

## 2017-08-18 ENCOUNTER — Encounter: Payer: Self-pay | Admitting: Hematology and Oncology

## 2017-08-18 ENCOUNTER — Ambulatory Visit (HOSPITAL_BASED_OUTPATIENT_CLINIC_OR_DEPARTMENT_OTHER): Payer: Medicare Other | Admitting: Medical

## 2017-08-18 ENCOUNTER — Inpatient Hospital Stay: Payer: Medicare Other

## 2017-08-18 ENCOUNTER — Inpatient Hospital Stay (HOSPITAL_BASED_OUTPATIENT_CLINIC_OR_DEPARTMENT_OTHER): Payer: Medicare Other | Admitting: Hematology and Oncology

## 2017-08-18 ENCOUNTER — Telehealth: Payer: Self-pay

## 2017-08-18 VITALS — BP 154/86 | HR 105 | Temp 98.6°F | Resp 18

## 2017-08-18 VITALS — BP 174/92 | HR 93 | Temp 98.8°F | Resp 20 | Ht 62.0 in | Wt 218.8 lb

## 2017-08-18 DIAGNOSIS — Z803 Family history of malignant neoplasm of breast: Secondary | ICD-10-CM | POA: Diagnosis not present

## 2017-08-18 DIAGNOSIS — E785 Hyperlipidemia, unspecified: Secondary | ICD-10-CM

## 2017-08-18 DIAGNOSIS — D509 Iron deficiency anemia, unspecified: Secondary | ICD-10-CM | POA: Diagnosis not present

## 2017-08-18 DIAGNOSIS — I10 Essential (primary) hypertension: Secondary | ICD-10-CM

## 2017-08-18 DIAGNOSIS — Z79899 Other long term (current) drug therapy: Secondary | ICD-10-CM

## 2017-08-18 DIAGNOSIS — T8090XA Unspecified complication following infusion and therapeutic injection, initial encounter: Secondary | ICD-10-CM

## 2017-08-18 DIAGNOSIS — M810 Age-related osteoporosis without current pathological fracture: Secondary | ICD-10-CM | POA: Diagnosis not present

## 2017-08-18 DIAGNOSIS — X58XXXA Exposure to other specified factors, initial encounter: Secondary | ICD-10-CM

## 2017-08-18 DIAGNOSIS — M1990 Unspecified osteoarthritis, unspecified site: Secondary | ICD-10-CM | POA: Diagnosis not present

## 2017-08-18 DIAGNOSIS — Z7982 Long term (current) use of aspirin: Secondary | ICD-10-CM

## 2017-08-18 MED ORDER — SODIUM CHLORIDE 0.9 % IV SOLN
Freq: Once | INTRAVENOUS | Status: AC
  Administered 2017-08-18: 11:00:00 via INTRAVENOUS

## 2017-08-18 MED ORDER — METHYLPREDNISOLONE SODIUM SUCC 125 MG IJ SOLR
125.0000 mg | Freq: Once | INTRAMUSCULAR | Status: AC
  Administered 2017-08-18: 125 mg via INTRAVENOUS

## 2017-08-18 MED ORDER — SODIUM CHLORIDE 0.9 % IV SOLN
510.0000 mg | Freq: Once | INTRAVENOUS | Status: AC
  Administered 2017-08-18: 510 mg via INTRAVENOUS
  Filled 2017-08-18: qty 17

## 2017-08-18 MED ORDER — ALBUTEROL SULFATE (2.5 MG/3ML) 0.083% IN NEBU
2.5000 mg | INHALATION_SOLUTION | Freq: Once | RESPIRATORY_TRACT | Status: AC
  Administered 2017-08-18: 2.5 mg via RESPIRATORY_TRACT
  Filled 2017-08-18: qty 3

## 2017-08-18 MED ORDER — DIPHENHYDRAMINE HCL 50 MG/ML IJ SOLN
50.0000 mg | Freq: Once | INTRAMUSCULAR | Status: AC
  Administered 2017-08-18: 50 mg via INTRAVENOUS

## 2017-08-18 MED ORDER — FAMOTIDINE IN NACL 20-0.9 MG/50ML-% IV SOLN
20.0000 mg | Freq: Once | INTRAVENOUS | Status: AC
  Administered 2017-08-18: 20 mg via INTRAVENOUS
  Filled 2017-08-18: qty 50

## 2017-08-18 NOTE — Telephone Encounter (Signed)
Printed avs and calender of upcoming appointment .per orders 2/28 los

## 2017-08-18 NOTE — Progress Notes (Signed)
Symptoms Management Clinic Progress Note   Penny Gibson [id number] [birth year] 72 y.o.  [facility] is managed by Dr. Annamaria Helling [facility] presents for:    Current Therapy: Feraheme  Penny Gibson was receiving Feraheme at the time of her reaction.  First dose of Feraheme: no  Last Treated:  08/15/2017  Assessment: Plan:    Infusion reaction, initial encounter  Penny Gibson was seen in the infusion room for a suspected infusion reaction. She was receiving  Feraheme at the time of her reaction. She had received a total of one third of her infusion prior to onset of symptoms. Her symptoms included: Lightheadedness, hypotension, chest tightness, throat tightness, shortness of breath, and mild hypoxia. She was not premedicated prior to starting her infusion. Feraheme was paused and [facility] was given Benadryl 25 mg IV, Solu-Medrol 125 mg IV, Pepcid 20 mg IV, albuterol nebulizer, and supplemental oxygen after onset of her symptoms. [facility] did  respond to intervention.  She was able to restart and complete IV Feraheme.  Please see After Visit Summary for patient specific instructions.  Future Appointments  Date Time Provider Laurie  10/11/2017 11:00 AM [facility] ONLY [facility] None  10/13/2017  9:20 AM Lebron Conners, Marinell Blight, MD [facility] None  10/13/2017 10:15 AM [facility] D12 [facility] None  11/07/2017 10:00 AM [name], [name], NP [facility]    No orders of the defined types were placed in this encounter.      Subjective:   Patient ID:  Penny Gibson is a 72 y.o. (DOB [birth year]) female.  Chief Complaint: No chief complaint on file.   HPI Penny Gibson was seen in the infusion room for a suspected infusion reaction. She was receiving  Feraheme at the time of her reaction. She had received a total of one third of her infusion prior to  onset of symptoms. Her symptoms included: Lightheadedness, hypotension, chest tightness, throat tightness, shortness of breath, and mild hypoxia. She was not premedicated prior to starting her infusion. Feraheme was paused and [facility] was given Benadryl 25 mg IV, Solu-Medrol 125 mg IV, Pepcid 20 mg IV, albuterol nebulizer, and supplemental oxygen after onset of her symptoms. [facility] did  respond to intervention.  She was able to restart and complete IV Feraheme.  Medications: I have reviewed the patient's current medications.  Allergies:  Allergies  Allergen Reactions  . Penicillins Anaphylaxis    Past Medical History:  Diagnosis Date  . Anemia   . Arthritis   . Hyperlipidemia   . Hypertension     Past Surgical History:  Procedure Laterality Date  . BREAST BIOPSY Left 2010  . BREAST LUMPECTOMY Left 2010  . TUBAL LIGATION      Family History  Problem Relation Age of Onset  . Heart attack Mother   . Heart disease Mother   . Cancer Sister   . Breast cancer Sister 81  . Stroke Maternal Grandmother     Social History   Socioeconomic History  . Marital status: Divorced    Spouse name: Not on file  . Number of children: Not on file  . Years of education: Not on file  . Highest education level: Not on file  Social Needs  . Financial resource strain: Not on file  . Food insecurity - worry: Not on file  . Food insecurity - inability: Not on file  . Transportation needs - medical: Not on file  .  Transportation needs - non-medical: Not on file  Occupational History  . Not on file  Tobacco Use  . Smoking status: Never Smoker  . Smokeless tobacco: Never Used  Substance and Sexual Activity  . Alcohol use: No  . Drug use: No  . Sexual activity: No    Birth control/protection: Surgical  Other Topics Concern  . Not on file  Social History Narrative  . Not on file    Past Medical History, Surgical history, Social history, and Family  history were reviewed and updated as appropriate.   Please see review of systems for further details on the patient's review from today.   Review of Systems:  Review of Systems  Constitutional: Negative for chills, diaphoresis and fever.  HENT: Positive for voice change. Negative for trouble swallowing.   Respiratory: Positive for chest tightness and shortness of breath. Negative for cough, choking and wheezing.   Cardiovascular: Negative for chest pain and palpitations.  Gastrointestinal: Negative for nausea and vomiting.  Musculoskeletal: Negative for back pain.  Neurological: Positive for light-headedness.    Objective:   Physical Exam:  There were no vitals taken for this visit.  Physical Exam  Constitutional: No distress.  HENT:  Head: Normocephalic and atraumatic.  Cardiovascular: Normal rate, regular rhythm and normal heart sounds. Exam reveals no gallop and no friction rub.  No murmur heard. Pulmonary/Chest: Effort normal and breath sounds normal. No respiratory distress. She has no wheezes. She has no rales.  Neurological: She is alert.  Skin: No rash noted. She is not diaphoretic. No erythema.  The patient's fingertips were pale and cool.    Lab Review:     Component Value Date/Time   NA 141 08/16/2017 1033   NA 139 01/31/2014 1002   K 3.7 08/16/2017 1033   CL 103 08/16/2017 1033   CO2 27 08/16/2017 1033   GLUCOSE 99 08/16/2017 1033   BUN 9 08/16/2017 1033   BUN 13 01/31/2014 1002   CREATININE 0.77 08/16/2017 1033   CREATININE 0.88 06/22/2013 1506   CALCIUM 9.4 08/16/2017 1033   PROT 8.1 08/16/2017 1033   PROT 8.0 01/31/2014 1002   ALBUMIN 3.5 08/16/2017 1033   ALBUMIN 4.4 01/31/2014 1002   AST 16 08/16/2017 1033   ALT 14 08/16/2017 1033   ALKPHOS 91 08/16/2017 1033   BILITOT 0.4 08/16/2017 1033   GFRNONAA >60 08/16/2017 1033   GFRAA >60 08/16/2017 1033       Component Value Date/Time   WBC 8.3 08/16/2017 1033   WBC 9.5 06/28/2017 0910   RBC  4.07 08/16/2017 1033   HGB 8.3 Repeated and verified X2. (L) 06/28/2017 0910   HCT 31.5 (L) 08/16/2017 1033   PLT 337 08/16/2017 1033   MCV 77.4 (L) 08/16/2017 1033   MCV 81.5 06/22/2013 1512   MCH 22.9 (L) 08/16/2017 1033   MCHC 29.5 (L) 08/16/2017 1033   RDW 26.9 (H) 08/16/2017 1033   RDW 15.5 (H) 01/31/2014 1002   LYMPHSABS 2.1 08/16/2017 1033   LYMPHSABS 2.0 01/31/2014 1002   MONOABS 0.6 08/16/2017 1033   EOSABS 0.2 08/16/2017 1033   EOSABS 0.2 01/31/2014 1002   BASOSABS 0.0 08/16/2017 1033   BASOSABS 0.0 01/31/2014 1002   -------------------------------  Imaging from last 24 hours (if applicable):  Radiology interpretation: Mm Diag Breast Tomo Uni Right  Result Date: 08/02/2017 CLINICAL DATA:  The patient returns after screening study for evaluation of possible distortion in the right breast. EXAM: 2D DIGITAL DIAGNOSTIC UNILATERAL RIGHT MAMMOGRAM WITH CAD  AND ADJUNCT TOMO COMPARISON:  07/15/2017 and earlier ACR Breast Density Category b: There are scattered areas of fibroglandular density. FINDINGS: Additional 3-D images are performed. These views demonstrate no persistent distortion in the upper portion of the right breast. No suspicious mass, distortion, or microcalcifications are identified to suggest presence of malignancy. Mammographic images were processed with CAD. IMPRESSION: No mammographic evidence for malignancy. RECOMMENDATION: Screening mammogram in one year.(Code:SM-B-01Y) I have discussed the findings and recommendations with the patient. Results were also provided in writing at the conclusion of the visit. If applicable, a reminder letter will be sent to the patient regarding the next appointment. BI-RADS CATEGORY  1: Negative. Electronically Signed   By: Nolon Nations M.D.   On: 08/02/2017 09:31        This case was discussed with Dr. Lebron Conners. He expressed his agreement with my management of this patient.

## 2017-08-18 NOTE — Progress Notes (Signed)
1045- Pt reports that she is having tingling and feeling very dizzy, shortly after starting her 2nd feraheme dose. Immediately stopped infusion and started a 1L NSS Bolus. VSS  Taken and Sandi Mealy, PA [facility]) called in to assess pt immediately for possible iron reaction. Pt given 50mg  benadryl, 125mg  IV solumedrol, 20mg  IV pepcid, albuterol breathing treatment (hx of asthma) and placed pt on 5L of oxygen. BP did drop significantly after giving rescue medications, but continue NSS bolus for a few more minutes until BP back to pt baseline 145/80's. Pt also reported chest tightness and feels like her throat was closing up.    1130- Per Van,PA, per Dr.Perlov, okay to rechallenge pt with iron at slower rate and to concurrently run extra NS with infusion. Will restart pt IV iron over 20-30 minutes. Pt reports that her symptoms are much improved post rescue medications. Pt oxygen decreased to 2L. Saturation at 100%. VSS and no acute distress at this time.   1200- Pt tolerated remaining iron infusion. Pt will be monitored for 30 minutes post iron. PT aware and will call her ride prior to being discharged today. Pt stable and in no acute distress.  1230- Pt stable 91min post iron infusion. VSS. Discharge with no issues. Pt called for her ride to come pick her up in the lobby.

## 2017-08-18 NOTE — Patient Instructions (Signed)

## 2017-08-22 ENCOUNTER — Telehealth: Payer: Self-pay

## 2017-08-22 NOTE — Telephone Encounter (Signed)
-----   Message from Lavonia Drafts, MD sent at 08/21/2017  3:38 PM EST ----- please call pt.  She is prediabetic. Please rec diet and exercise (walking program). She should f/u with her primary care doc for further management.   Thx,  clh-S

## 2017-08-22 NOTE — Telephone Encounter (Signed)
Patient made aware that she is prediabetic and Dr. Ihor Dow does recommend that she follow up with her primary care doc. She also recommends diet and exercise like a walking program. Patient states understanding. Kathrene Alu RNBSN

## 2017-08-29 NOTE — Progress Notes (Signed)
[facility] Follow-up Visit:  Assessment: Iron deficiency anemia 72 y.o. 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  Patient could not tolerate ferrous sulfate medication due to gastrointestinal symptoms.  Since last visit to the clinic, patient has received a single dose of Feraheme and was instructed to try ferrous gluconate.  She did not use her oral medication.  After single dose of Feraheme, we are seeing significant improvement in the hematological parameters as outlined below.  Plan: -Proceed with a second dose of Feraheme today. -Discontinue ferrous gluconate  -Return to clinic in 2 months with lab work 2 days prior, clinic visit, possible additional Feraheme infusion.    Voice recognition software was used and creation of this note. Despite my best effort at editing the text, some misspelling/errors may have occurred.  Orders Placed This Encounter  Procedures  . CBC with Differential (Cancer Center Only)    Standing Status:   Future    Standing Expiration Date:   08/18/2018  . CMP ([facility] only)    Standing Status:   Future    Standing Expiration Date:   08/18/2018  . Iron and TIBC    Standing Status:   Future    Standing Expiration Date:   08/18/2018  . Ferritin    Standing Status:   Future    Standing Expiration Date:   08/18/2018    Cancer Staging No matching staging information was found for the patient.  All questions were answered.  . The patient knows to call the clinic with any problems, questions or concerns.  This note was electronically signed.    History of Presenting Illness Penny Gibson is a 72 y.o. female followed in the [place] for diagnosis of iron deficiency anemia, referred by Leana Gamer, NP.  Patient's past medical history is significant for osteoarthritis, hypertension.  Patient is  taking aspirin, ibuprofen, and meloxicam.  Family history is significant for sister with breast cancer at age of 3, no other malignancy, bleeding, or abnormal clotting history in the family.  Patient has been previously prescribed oral iron, but could not tolerate ferrous sulfate due to abdominal pain, nausea, and constipation.  At the present time, she is scheduled to see gastroenterology on 08/08/17 for possible colonoscopy evaluation.  Since last visit to the clinic, patient has received 1 dose of Feraheme.  She is not actively taking her iron gluconate.  Denies any new symptoms.  Denies any persistence of ice cravings.  Oncological/hematological History: --Labs, 05/28/16: Hgb 11.2, MCV 76.2, MCH     ...,        RDW 19.9, Plt 336;  --Labs, 06/28/17: Hgb   8.3, MCV 67.8, MCH 30.2,        RDW 21.9, Plt 483; Fe 30, FeSat   6%, Transferrin 347; --Labs, 07/19/17: Hgb   8.4, MCV 66.4, MCH 20.3, MCHC 30.6, RDW 22.5, Plt 446; Fe 25, FeSat   6%, TIBC 414, Ferritin   9 --Treatment:    --Feraheme 53m IV x1, 07/26/17  --Labs, 08/16/17: Hgb   9.3, MCV 77.4, MCH 22.9, MCHC 29.5, RDW 26.9, Plt 337; Fe 32, FeSat 10%, TIBC 305, Ferritin 92    --Feraheme 5144mIV x1, 08/18/17    No history exists.    Medical History: Past Medical History:  Diagnosis Date  . Anemia   . Arthritis   . Hyperlipidemia   .  Hypertension     Surgical History: Past Surgical History:  Procedure Laterality Date  . BREAST BIOPSY Left 2010  . BREAST LUMPECTOMY Left 2010  . TUBAL LIGATION      Family History: Family History  Problem Relation Age of Onset  . Heart attack Mother   . Heart disease Mother   . Cancer Sister   . Breast cancer Sister 84  . Stroke Maternal Grandmother     Social History: Social History   Socioeconomic History  . Marital status: Divorced    Spouse name: Not on file  . Number of children: Not on file  . Years of education: Not on file  . Highest education level: Not on file  Social  Needs  . Financial resource strain: Not on file  . Food insecurity - worry: Not on file  . Food insecurity - inability: Not on file  . Transportation needs - medical: Not on file  . Transportation needs - non-medical: Not on file  Occupational History  . Not on file  Tobacco Use  . Smoking status: Never Smoker  . Smokeless tobacco: Never Used  Substance and Sexual Activity  . Alcohol use: No  . Drug use: No  . Sexual activity: No    Birth control/protection: Surgical  Other Topics Concern  . Not on file  Social History Narrative  . Not on file    Allergies: Allergies  Allergen Reactions  . Penicillins Anaphylaxis    Medications:  Current Outpatient Medications  Medication Sig Dispense Refill  . albuterol (PROAIR HFA) 108 (90 Base) MCG/ACT inhaler Inhale 2 puffs into the lungs every 6 (six) hours as needed for wheezing or shortness of breath. 1 Inhaler 5  . aspirin 81 MG tablet Take 81 mg by mouth daily.    . budesonide-formoterol (SYMBICORT) 160-4.5 MCG/ACT inhaler Inhale 1 puff into the lungs 2 (two) times daily. Rinse mouth after use 1 Inhaler 5  . cetirizine (ZYRTEC) 10 MG tablet Take 1 tablet (10 mg total) by mouth daily. 30 tablet 2  . hydrochlorothiazide (MICROZIDE) 12.5 MG capsule Take 12.5 mg by mouth daily.    Marland Kitchen ibuprofen (ADVIL,MOTRIN) 200 MG tablet Take 1 tablet (200 mg total) by mouth every 8 (eight) hours as needed. With food 30 tablet 0  . ipratropium (ATROVENT) 0.03 % nasal spray Place 2 sprays into both nostrils 2 (two) times daily. Do not use for more than 5days. 30 mL 0  . lisinopril (PRINIVIL,ZESTRIL) 40 MG tablet Take 1 tablet (40 mg total) by mouth daily. 90 tablet 2  . mometasone (NASONEX) 50 MCG/ACT nasal spray Place 2 sprays into the nose daily. 17 g 12  . meloxicam (MOBIC) 7.5 MG tablet Take 1 tablet (7.5 mg total) by mouth daily as needed for pain. With food (Patient not taking: Reported on 08/08/2017) 30 tablet 1   No current facility-administered  medications for this visit.     Review of Systems: Review of Systems  Constitutional: Positive for fatigue.  All other systems reviewed and are negative.    PHYSICAL EXAMINATION Blood pressure (!) 174/92, pulse 93, temperature 98.8 F (37.1 C), temperature source Oral, resp. rate 20, height 5' 2"  (1.575 m), weight 218 lb 12.8 oz (99.2 kg), SpO2 95 %.  ECOG PERFORMANCE STATUS: 1 - Symptomatic but completely ambulatory  Physical Exam  Constitutional: She is oriented to person, place, and time and well-developed, well-nourished, and in no distress. No distress.  HENT:  Head: Normocephalic and atraumatic.  Mouth/Throat: Oropharynx is  clear and moist. No oropharyngeal exudate.  Eyes: Conjunctivae and EOM are normal. Pupils are equal, round, and reactive to light. No scleral icterus.  Neck: No thyromegaly present.  Cardiovascular: Normal rate, regular rhythm and normal heart sounds.  No murmur heard. Pulmonary/Chest: Effort normal and breath sounds normal. No respiratory distress. She has no wheezes. She has no rales.  Abdominal: Soft. Bowel sounds are normal. She exhibits no distension and no mass. There is no tenderness. There is no guarding.  Musculoskeletal: She exhibits no edema.  Lymphadenopathy:    She has no cervical adenopathy.  Neurological: She is alert and oriented to person, place, and time. She has normal reflexes. No cranial nerve deficit.  Skin: Skin is warm and dry. No rash noted. She is not diaphoretic. No erythema. There is pallor.     LABORATORY DATA: I have personally reviewed the data as listed: Appointment on 08/16/2017  Component Date Value Ref Range Status  . Ferritin 08/16/2017 92  9 - 269 ng/mL Final   Performed at [facility], [place] [address], [place], [place] [address]  . Iron 08/16/2017 32* 41 - 142 ug/dL Final  . TIBC 08/16/2017 305  236 - 444 ug/dL Final  . Saturation Ratios 08/16/2017 10* 21 - 57 % Final  . UIBC 08/16/2017  273  ug/dL Final   Performed at [facility], [place] [address], [place], [place] [address]  . Sodium 08/16/2017 141  136 - 145 mmol/L Final  . Potassium 08/16/2017 3.7  3.5 - 5.1 mmol/L Final  . Chloride 08/16/2017 103  98 - 109 mmol/L Final  . CO2 08/16/2017 27  22 - 29 mmol/L Final  . Glucose, Bld 08/16/2017 99  70 - 140 mg/dL Final  . BUN 08/16/2017 9  7 - 26 mg/dL Final  . Creatinine 08/16/2017 0.77  0.60 - 1.10 mg/dL Final  . Calcium 08/16/2017 9.4  8.4 - 10.4 mg/dL Final  . Total Protein 08/16/2017 8.1  6.4 - 8.3 g/dL Final  . Albumin 08/16/2017 3.5  3.5 - 5.0 g/dL Final  . AST 08/16/2017 16  5 - 34 U/L Final  . ALT 08/16/2017 14  0 - 55 U/L Final  . Alkaline Phosphatase 08/16/2017 91  40 - 150 U/L Final  . Total Bilirubin 08/16/2017 0.4  0.2 - 1.2 mg/dL Final  . GFR, Est Non Af Am 08/16/2017 >60  >60 mL/min Final  . GFR, Est AFR Am 08/16/2017 >60  >60 mL/min Final   Comment: (NOTE) The eGFR has been calculated using the CKD EPI equation. This calculation has not been validated in all clinical situations. eGFR's persistently <60 mL/min signify possible Chronic Kidney Disease.   Georgiann Hahn gap 08/16/2017 11  3 - 11 Final   Performed at [facility], [place] [address] [place], [place] [address]  . WBC Count 08/16/2017 8.3  3.9 - 10.3 K/uL Final  . RBC 08/16/2017 4.07  3.70 - 5.45 MIL/uL Final  . Hemoglobin 08/16/2017 9.3* 11.6 - 15.9 g/dL Final  . HCT 08/16/2017 31.5* 34.8 - 46.6 % Final  . MCV 08/16/2017 77.4* 79.5 - 101.0 fL Final  . MCH 08/16/2017 22.9* 25.1 - 34.0 pg Final  . MCHC 08/16/2017 29.5* 31.5 - 36.0 g/dL Final  . RDW 08/16/2017 26.9* 11.2 - 14.5 % Final  . Platelet Count 08/16/2017 337  145 - 400 K/uL Final  . Neutrophils Relative % 08/16/2017 66  % Final  . Neutro Abs 08/16/2017  5.5  1.5 - 6.5 K/uL Final  . Lymphocytes Relative 08/16/2017 25  % Final  . Lymphs Abs 08/16/2017 2.1  0.9 - 3.3 K/uL Final  . Monocytes  Relative 08/16/2017 7  % Final  . Monocytes Absolute 08/16/2017 0.6  0.1 - 0.9 K/uL Final  . Eosinophils Relative 08/16/2017 2  % Final  . Eosinophils Absolute 08/16/2017 0.2  0.0 - 0.5 K/uL Final  . Basophils Relative 08/16/2017 0  % Final  . Basophils Absolute 08/16/2017 0.0  0.0 - 0.1 K/uL Final   Performed at [facility], [place] [address], [place], [place] [address]       Ardath Sax, MD

## 2017-08-29 NOTE — Assessment & Plan Note (Signed)
72 y.o. 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  Patient could not tolerate ferrous sulfate medication due to gastrointestinal symptoms.  Since last visit to the clinic, patient has received a single dose of Feraheme and was instructed to try ferrous gluconate.  She did not use her oral medication.  After single dose of Feraheme, we are seeing significant improvement in the hematological parameters as outlined below.  Plan: -Proceed with a second dose of Feraheme today. -Discontinue ferrous gluconate  -Return to clinic in 2 months with lab work 2 days prior, clinic visit, possible additional Feraheme infusion.

## 2017-10-11 ENCOUNTER — Inpatient Hospital Stay: Payer: Medicare Other | Attending: Hematology and Oncology

## 2017-10-11 DIAGNOSIS — R4789 Other speech disturbances: Secondary | ICD-10-CM | POA: Diagnosis not present

## 2017-10-11 DIAGNOSIS — X58XXXS Exposure to other specified factors, sequela: Secondary | ICD-10-CM | POA: Diagnosis not present

## 2017-10-11 DIAGNOSIS — Z803 Family history of malignant neoplasm of breast: Secondary | ICD-10-CM | POA: Diagnosis not present

## 2017-10-11 DIAGNOSIS — M1990 Unspecified osteoarthritis, unspecified site: Secondary | ICD-10-CM | POA: Diagnosis not present

## 2017-10-11 DIAGNOSIS — Z79899 Other long term (current) drug therapy: Secondary | ICD-10-CM | POA: Insufficient documentation

## 2017-10-11 DIAGNOSIS — R400 Somnolence: Secondary | ICD-10-CM | POA: Diagnosis not present

## 2017-10-11 DIAGNOSIS — Z7952 Long term (current) use of systemic steroids: Secondary | ICD-10-CM | POA: Diagnosis not present

## 2017-10-11 DIAGNOSIS — I10 Essential (primary) hypertension: Secondary | ICD-10-CM | POA: Insufficient documentation

## 2017-10-11 DIAGNOSIS — R1111 Vomiting without nausea: Secondary | ICD-10-CM | POA: Insufficient documentation

## 2017-10-11 DIAGNOSIS — R0902 Hypoxemia: Secondary | ICD-10-CM | POA: Diagnosis not present

## 2017-10-11 DIAGNOSIS — R079 Chest pain, unspecified: Secondary | ICD-10-CM | POA: Diagnosis not present

## 2017-10-11 DIAGNOSIS — R0681 Apnea, not elsewhere classified: Secondary | ICD-10-CM | POA: Insufficient documentation

## 2017-10-11 DIAGNOSIS — T8090XA Unspecified complication following infusion and therapeutic injection, initial encounter: Secondary | ICD-10-CM | POA: Diagnosis not present

## 2017-10-11 DIAGNOSIS — Z7982 Long term (current) use of aspirin: Secondary | ICD-10-CM | POA: Insufficient documentation

## 2017-10-11 DIAGNOSIS — E785 Hyperlipidemia, unspecified: Secondary | ICD-10-CM | POA: Insufficient documentation

## 2017-10-11 DIAGNOSIS — R55 Syncope and collapse: Secondary | ICD-10-CM | POA: Diagnosis not present

## 2017-10-11 DIAGNOSIS — D509 Iron deficiency anemia, unspecified: Secondary | ICD-10-CM | POA: Diagnosis not present

## 2017-10-11 LAB — CMP (CANCER CENTER ONLY)
ALBUMIN: 3.7 g/dL (ref 3.5–5.0)
ALK PHOS: 90 U/L (ref 40–150)
ALT: 13 U/L (ref 0–55)
AST: 16 U/L (ref 5–34)
Anion gap: 10 (ref 3–11)
BILIRUBIN TOTAL: 0.3 mg/dL (ref 0.2–1.2)
BUN: 16 mg/dL (ref 7–26)
CALCIUM: 9.4 mg/dL (ref 8.4–10.4)
CO2: 26 mmol/L (ref 22–29)
CREATININE: 0.77 mg/dL (ref 0.60–1.10)
Chloride: 103 mmol/L (ref 98–109)
GFR, Est AFR Am: >60 mL/min (ref >60)
GFR, Estimated: >60 mL/min (ref >60)
GLUCOSE: 98 mg/dL (ref 70–140)
Potassium: 3.4 mmol/L — ABNORMAL LOW (ref 3.5–5.1)
SODIUM: 139 mmol/L (ref 136–145)
TOTAL PROTEIN: 8.2 g/dL (ref 6.4–8.3)

## 2017-10-11 LAB — CBC WITH DIFFERENTIAL (CANCER CENTER ONLY)
BASOS PCT: 1 %
Basophils Absolute: 0.1 10*3/uL (ref 0.0–0.1)
EOS ABS: 0.1 10*3/uL (ref 0.0–0.5)
Eosinophils Relative: 1 %
HEMATOCRIT: 33.5 % — AB (ref 34.8–46.6)
HEMOGLOBIN: 10.7 g/dL — AB (ref 11.6–15.9)
Lymphocytes Relative: 19 %
Lymphs Abs: 1.8 10*3/uL (ref 0.9–3.3)
MCH: 24.9 pg — AB (ref 25.1–34.0)
MCHC: 32 g/dL (ref 31.5–36.0)
MCV: 77.9 fL — ABNORMAL LOW (ref 79.5–101.0)
MONO ABS: 0.5 10*3/uL (ref 0.1–0.9)
MONOS PCT: 6 %
Neutro Abs: 6.9 10*3/uL — ABNORMAL HIGH (ref 1.5–6.5)
Neutrophils Relative %: 73 %
Platelet Count: 298 10*3/uL (ref 145–400)
RBC: 4.31 MIL/uL (ref 3.70–5.45)
RDW: 25.8 % — AB (ref 11.2–14.5)
WBC Count: 9.5 10*3/uL (ref 3.9–10.3)

## 2017-10-11 LAB — FERRITIN: Ferritin: 26 ng/mL (ref 9–269)

## 2017-10-11 LAB — IRON AND TIBC
Iron: 33 ug/dL — ABNORMAL LOW (ref 41–142)
Saturation Ratios: 10 % — ABNORMAL LOW (ref 21–57)
TIBC: 335 ug/dL (ref 236–444)
UIBC: 302 ug/dL

## 2017-10-13 ENCOUNTER — Telehealth: Payer: Self-pay | Admitting: Hematology and Oncology

## 2017-10-13 ENCOUNTER — Other Ambulatory Visit: Payer: Self-pay

## 2017-10-13 ENCOUNTER — Inpatient Hospital Stay (HOSPITAL_BASED_OUTPATIENT_CLINIC_OR_DEPARTMENT_OTHER): Payer: Medicare Other | Admitting: Medical

## 2017-10-13 ENCOUNTER — Inpatient Hospital Stay: Payer: Medicare Other

## 2017-10-13 ENCOUNTER — Telehealth: Payer: Self-pay | Admitting: *Deleted

## 2017-10-13 ENCOUNTER — Inpatient Hospital Stay (HOSPITAL_BASED_OUTPATIENT_CLINIC_OR_DEPARTMENT_OTHER): Payer: Medicare Other | Admitting: Hematology and Oncology

## 2017-10-13 ENCOUNTER — Emergency Department (HOSPITAL_COMMUNITY)
Admission: EM | Admit: 2017-10-13 | Discharge: 2017-10-13 | Disposition: A | Discharge status: Home / Self Care | Payer: Medicare Other | Attending: Emergency Medicine | Admitting: Emergency Medicine

## 2017-10-13 ENCOUNTER — Encounter (HOSPITAL_COMMUNITY): Payer: Self-pay

## 2017-10-13 VITALS — BP 166/77 | HR 93 | Temp 98.4°F | Resp 18 | Ht 62.0 in | Wt 220.8 lb

## 2017-10-13 DIAGNOSIS — T782XXA Anaphylactic shock, unspecified, initial encounter: Secondary | ICD-10-CM | POA: Diagnosis not present

## 2017-10-13 DIAGNOSIS — D509 Iron deficiency anemia, unspecified: Secondary | ICD-10-CM

## 2017-10-13 DIAGNOSIS — R55 Syncope and collapse: Secondary | ICD-10-CM | POA: Diagnosis not present

## 2017-10-13 DIAGNOSIS — E785 Hyperlipidemia, unspecified: Secondary | ICD-10-CM | POA: Diagnosis not present

## 2017-10-13 DIAGNOSIS — Z79899 Other long term (current) drug therapy: Secondary | ICD-10-CM | POA: Diagnosis not present

## 2017-10-13 DIAGNOSIS — R4789 Other speech disturbances: Secondary | ICD-10-CM | POA: Diagnosis not present

## 2017-10-13 DIAGNOSIS — Z7952 Long term (current) use of systemic steroids: Secondary | ICD-10-CM | POA: Diagnosis not present

## 2017-10-13 DIAGNOSIS — M1990 Unspecified osteoarthritis, unspecified site: Secondary | ICD-10-CM | POA: Diagnosis not present

## 2017-10-13 DIAGNOSIS — Z803 Family history of malignant neoplasm of breast: Secondary | ICD-10-CM | POA: Diagnosis not present

## 2017-10-13 DIAGNOSIS — R400 Somnolence: Secondary | ICD-10-CM

## 2017-10-13 DIAGNOSIS — R0681 Apnea, not elsewhere classified: Secondary | ICD-10-CM

## 2017-10-13 DIAGNOSIS — X58XXXS Exposure to other specified factors, sequela: Secondary | ICD-10-CM

## 2017-10-13 DIAGNOSIS — T8090XA Unspecified complication following infusion and therapeutic injection, initial encounter: Secondary | ICD-10-CM | POA: Diagnosis not present

## 2017-10-13 DIAGNOSIS — R1111 Vomiting without nausea: Secondary | ICD-10-CM | POA: Diagnosis not present

## 2017-10-13 DIAGNOSIS — R0902 Hypoxemia: Secondary | ICD-10-CM | POA: Diagnosis not present

## 2017-10-13 DIAGNOSIS — I10 Essential (primary) hypertension: Secondary | ICD-10-CM

## 2017-10-13 DIAGNOSIS — Z7982 Long term (current) use of aspirin: Secondary | ICD-10-CM | POA: Diagnosis not present

## 2017-10-13 DIAGNOSIS — R079 Chest pain, unspecified: Secondary | ICD-10-CM

## 2017-10-13 DIAGNOSIS — T7840XA Allergy, unspecified, initial encounter: Secondary | ICD-10-CM | POA: Diagnosis present

## 2017-10-13 LAB — CBC WITH DIFFERENTIAL/PLATELET
BASOS ABS: 0 10*3/uL (ref 0.0–0.1)
Basophils Relative: 0 %
Eosinophils Absolute: 0.2 10*3/uL (ref 0.0–0.7)
Eosinophils Relative: 1 %
HEMATOCRIT: 35 % — AB (ref 36.0–46.0)
HEMOGLOBIN: 10.9 g/dL — AB (ref 12.0–15.0)
LYMPHS PCT: 22 %
Lymphs Abs: 4.6 10*3/uL — ABNORMAL HIGH (ref 0.7–4.0)
MCH: 25.7 pg — ABNORMAL LOW (ref 26.0–34.0)
MCHC: 31.1 g/dL (ref 30.0–36.0)
MCV: 82.5 fL (ref 78.0–100.0)
MONOS PCT: 3 %
Monocytes Absolute: 0.6 10*3/uL (ref 0.1–1.0)
NEUTROS PCT: 74 %
Neutro Abs: 15.5 10*3/uL — ABNORMAL HIGH (ref 1.7–7.7)
Platelets: 303 10*3/uL (ref 150–400)
RBC: 4.24 MIL/uL (ref 3.87–5.11)
RDW: 23.4 % — ABNORMAL HIGH (ref 11.5–15.5)
WBC: 20.9 10*3/uL — AB (ref 4.0–10.5)

## 2017-10-13 LAB — BASIC METABOLIC PANEL
Anion gap: 13 (ref 5–15)
BUN: 12 mg/dL (ref 6–20)
CALCIUM: 9 mg/dL (ref 8.9–10.3)
CHLORIDE: 106 mmol/L (ref 101–111)
CO2: 23 mmol/L (ref 22–32)
Creatinine, Ser: 0.69 mg/dL (ref 0.44–1.00)
GFR calc non Af Amer: >60 mL/min (ref >60)
Glucose, Bld: 154 mg/dL — ABNORMAL HIGH (ref 65–99)
Potassium: 3.8 mmol/L (ref 3.5–5.1)
Sodium: 142 mmol/L (ref 135–145)

## 2017-10-13 LAB — CBG MONITORING, ED: GLUCOSE-CAPILLARY: 131 mg/dL — AB (ref 65–99)

## 2017-10-13 MED ORDER — DIPHENHYDRAMINE HCL 50 MG/ML IJ SOLN
INTRAMUSCULAR | Status: AC
  Filled 2017-10-13: qty 1

## 2017-10-13 MED ORDER — FAMOTIDINE IN NACL 20-0.9 MG/50ML-% IV SOLN
20.0000 mg | Freq: Once | INTRAVENOUS | Status: AC
  Administered 2017-10-13: 20 mg via INTRAVENOUS

## 2017-10-13 MED ORDER — SODIUM CHLORIDE 0.9 % IV BOLUS
1000.0000 mL | Freq: Once | INTRAVENOUS | Status: AC
  Administered 2017-10-13: 1000 mL via INTRAVENOUS

## 2017-10-13 MED ORDER — SODIUM CHLORIDE 0.9 % IV SOLN
510.0000 mg | Freq: Once | INTRAVENOUS | Status: AC
  Administered 2017-10-13: 510 mg via INTRAVENOUS
  Filled 2017-10-13: qty 17

## 2017-10-13 MED ORDER — EPINEPHRINE 0.3 MG/0.3ML IJ SOAJ: 0.3000 mg | Freq: Once | INTRAMUSCULAR | 0 refills | Status: AC

## 2017-10-13 MED ORDER — DIPHENHYDRAMINE HCL 50 MG/ML IJ SOLN
50.0000 mg | Freq: Once | INTRAMUSCULAR | Status: AC
  Administered 2017-10-13: 50 mg via INTRAVENOUS

## 2017-10-13 MED ORDER — DIPHENHYDRAMINE HCL 50 MG/ML IJ SOLN
25.0000 mg | Freq: Once | INTRAMUSCULAR | Status: AC
  Administered 2017-10-13: 25 mg via INTRAVENOUS

## 2017-10-13 MED ORDER — METHYLPREDNISOLONE SODIUM SUCC 40 MG IJ SOLR
INTRAMUSCULAR | Status: AC
  Filled 2017-10-13: qty 1

## 2017-10-13 MED ORDER — METHYLPREDNISOLONE SODIUM SUCC 40 MG IJ SOLR
40.0000 mg | Freq: Once | INTRAMUSCULAR | Status: AC
  Administered 2017-10-13: 40 mg via INTRAVENOUS

## 2017-10-13 MED ORDER — EPINEPHRINE PF 1 MG/ML IJ SOLN
1.0000 mg | Freq: Once | INTRAMUSCULAR | Status: AC
  Administered 2017-10-13: 1 mg via INTRAMUSCULAR

## 2017-10-13 MED ORDER — ONDANSETRON HCL 4 MG/2ML IJ SOLN
8.0000 mg | Freq: Once | INTRAMUSCULAR | Status: AC
  Administered 2017-10-13: 8 mg via INTRAVENOUS

## 2017-10-13 MED ORDER — FAMOTIDINE IN NACL 20-0.9 MG/50ML-% IV SOLN
INTRAVENOUS | Status: AC
  Filled 2017-10-13: qty 50

## 2017-10-13 MED ORDER — METHYLPREDNISOLONE SODIUM SUCC 125 MG IJ SOLR
125.0000 mg | Freq: Once | INTRAMUSCULAR | Status: AC
  Administered 2017-10-13: 125 mg via INTRAVENOUS

## 2017-10-13 MED ORDER — SODIUM CHLORIDE 0.9 % IV SOLN: Freq: Once | INTRAVENOUS | Status: DC

## 2017-10-13 MED ORDER — SODIUM CHLORIDE 0.9 % IV SOLN
40.0000 mg | Freq: Once | INTRAVENOUS | Status: DC
  Filled 2017-10-13: qty 0.32

## 2017-10-13 MED ORDER — PREDNISONE 20 MG PO TABS: ORAL_TABLET | ORAL | 0 refills | Status: DC

## 2017-10-13 MED ORDER — ONDANSETRON HCL 4 MG/2ML IJ SOLN
INTRAMUSCULAR | Status: AC
Start: 2017-10-13 — End: 2017-10-13
  Filled 2017-10-13: qty 4

## 2017-10-13 NOTE — Patient Instructions (Signed)

## 2017-10-13 NOTE — Progress Notes (Signed)
Feraheme infusion begin at 1125. At 1130 patient reported chest pain and stopped breathing. Infusion stopped, Hypersensitivity protocol initiated, and code blue called. Sandi Mealy at chairside. Patient was given epinephrine x2, solumedrol 125 mg, and benadryl 50 mg. She was put on a nonrebreather mask. Code team responded and took report on patient. She was transported to the ED alert and oriented. Report given to ED.

## 2017-10-13 NOTE — Telephone Encounter (Signed)
Referral not in during check out.

## 2017-10-13 NOTE — Telephone Encounter (Signed)
FYI "I need the lady at registration check in desk to look outside on the round table for a knotted dark blue sweater with grey and white design.  I left it after completing today's infusion.  Called Security, learned they don't handle lost and found.  This was a gift from my daughter."

## 2017-10-13 NOTE — ED Triage Notes (Signed)
Anaphylactic reaction to feraheme. Pt received same med a week ago, but a much less reaction. Premeds were given (pepcid, 40 solumedrol, 25 benadryl). 5 mins into reaction, pt unable to breathe. Pt received epipen x2. 125 solumedrol, 50 benadryl. Report from [place] from [place] [facility]

## 2017-10-13 NOTE — ED Provider Notes (Signed)
[facility] DEPT Provider Note   CSN: [id number] Arrival date & time: 10/13/17  1153     History   Chief Complaint Chief Complaint  Patient presents with  . Allergic Reaction    HPI Penny Gibson is a 72 y.o. female hx of iron deficiency anemia, here patient pink with possible allergic reaction.  Patient reports that the cancer earlier today and received iron infusion.  She had previous allergic reaction to iron infusion and was premedicated with Solu-Medrol and Benadryl and Pepcid.  However, shortly after the infusion, patient developed some shortness of breath and trouble breathing and vomiting and apparently had a syncopal event. CODE BLUE was activated by oncologist.  I was called to evaluate the patient.  Patient appears to be vomiting at that time but oropharynx was clear and lungs are clear.  Patient was given 2 doses of IM epinephrine and states that she started feeling better.  She was subsequently transferred to the ER for monitoring and observation.   The history is provided by the patient.    Past Medical History:  Diagnosis Date  . Anemia   . Arthritis   . Hyperlipidemia   . Hypertension     Patient Active Problem List   Diagnosis Date Noted  . Iron deficiency anemia 07/19/2017  . 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 Asthma, mild intermittent 05/28/2016  . Other seasonal allergic rhinitis 07/26/2014  . Atypical squamous cells of undetermined significance (ASCUS) on Papanicolaou smear of cervix 01/31/2014  . BMI 40.0-44.9, adult ([facility]) 11/15/2013  . Essential hypertension 09/19/2013  . Generalized osteoarthritis of multiple sites     Past Surgical History:  Procedure Laterality Date  . BREAST BIOPSY Left 2010  . BREAST LUMPECTOMY Left 2010  . TUBAL LIGATION       OB History    Gravida  2   Para  2   Term  2   Preterm      AB      Living  2     SAB      TAB      Ectopic      Multiple      Live  Births  2            Home Medications    Prior to Admission medications   Medication Sig Start Date End Date Taking? Authorizing Provider  albuterol (PROAIR HFA) 108 (90 Base) MCG/ACT inhaler Inhale 2 puffs into the lungs every 6 (six) hours as needed for wheezing or shortness of breath. 06/07/17  Yes [name], [name], NP  aspirin 81 MG tablet Take 81 mg by mouth daily.   Yes [provider]  budesonide-formoterol (SYMBICORT) 160-4.5 MCG/ACT inhaler Inhale 1 puff into the lungs 2 (two) times daily. Rinse mouth after use 06/07/17  Yes [name], [name], NP  cetirizine (ZYRTEC) 10 MG tablet Take 1 tablet (10 mg total) by mouth daily. 09/24/16  Yes [name], [name], NP  hydrochlorothiazide (MICROZIDE) 12.5 MG capsule Take 12.5 mg by mouth daily.   Yes [provider]  ibuprofen (ADVIL,MOTRIN) 200 MG tablet Take 1 tablet (200 mg total) by mouth every 8 (eight) hours as needed. With food 12/31/16  Yes [name], [name], NP  lisinopril (PRINIVIL,ZESTRIL) 40 MG tablet Take 1 tablet (40 mg total) by mouth daily. 12/31/16  Yes [name], [name], NP  ipratropium (ATROVENT) 0.03 % nasal spray Place 2 sprays into both nostrils 2 (two) times daily. Do not use for more  than 5days. Patient not taking: Reported on 10/13/2017 06/07/17   [name], [name], NP  meloxicam (MOBIC) 7.5 MG tablet Take 1 tablet (7.5 mg total) by mouth daily as needed for pain. With food Patient not taking: Reported on 08/08/2017 07/11/17   [name], [name], NP  mometasone (NASONEX) 50 MCG/ACT nasal spray Place 2 sprays into the nose daily. Patient not taking: Reported on 10/13/2017 12/31/16   [name], [name], NP    Family History Family History  Problem Relation Age of Onset  . Heart attack Mother   . Heart disease Mother   . Cancer Sister   . Breast cancer Sister 77  . Stroke Maternal Grandmother     Social History Social History   Tobacco Use  . Smoking status: Never Smoker  .  Smokeless tobacco: Never Used  Substance Use Topics  . Alcohol use: No  . Drug use: No     Allergies   Feraheme [ferumoxytol] and Penicillins   Review of Systems Review of Systems  Respiratory: Positive for shortness of breath.   Gastrointestinal: Positive for vomiting.  All other systems reviewed and are negative.    Physical Exam Updated Vital Signs BP (!) 171/92   Pulse (!) 109   Temp 97.7 F (36.5 C) (Oral)   Resp (!) 26   Ht 5\' 2"  (1.575 m)   Wt 99.8 kg (220 lb)   SpO2 95%   BMI 40.24 kg/m   Physical Exam  Constitutional: She is oriented to person, place, and time.  Chronically ill   HENT:  Head: Normocephalic.  OP clear   Eyes: Pupils are equal, round, and reactive to light. Conjunctivae and EOM are normal.  Neck: Normal range of motion. Neck supple.  Cardiovascular: Normal rate, regular rhythm and normal heart sounds.  Pulmonary/Chest: Effort normal and breath sounds normal. No stridor. No respiratory distress.  No wheezing   Abdominal: Soft. Bowel sounds are normal. She exhibits no distension. There is no tenderness.  Musculoskeletal: Normal range of motion.  Neurological: She is alert and oriented to person, place, and time.  Skin: Skin is warm.  Psychiatric: She has a normal mood and affect.  Nursing note and vitals reviewed.    ED Treatments / Results  Labs (all labs ordered are listed, but only abnormal results are displayed) Labs Reviewed  CBC WITH DIFFERENTIAL/PLATELET - Abnormal; Notable for the following components:      Result Value   WBC 20.9 (*)    Hemoglobin 10.9 (*)    HCT 35.0 (*)    MCH 25.7 (*)    RDW 23.4 (*)    Neutro Abs 15.5 (*)    Lymphs Abs 4.6 (*)    All other components within normal limits  BASIC METABOLIC PANEL - Abnormal; Notable for the following components:   Glucose, Bld 154 (*)    All other components within normal limits  CBG MONITORING, ED - Abnormal; Notable for the following components:    Glucose-Capillary 131 (*)    All other components within normal limits    EKG EKG Interpretation  Date/Time:  Thursday October 13 2017 12:03:14 EDT Ventricular Rate:  97 PR Interval:    QRS Duration: 101 QT Interval:  384 QTC Calculation: 488 R Axis:   -59 Text Interpretation:  Sinus rhythm LAD, consider left anterior fascicular block Borderline prolonged QT interval No significant change since last tracing Confirmed by Wandra Arthurs [phone]) on 10/13/2017 2:40:06 PM   Radiology No results found.  Procedures  Procedures (including critical care time)  Medications Ordered in ED Medications  sodium chloride 0.9 % bolus 1,000 mL (0 mLs Intravenous Stopped 10/13/17 1420)     Initial Impression / Assessment and Plan / ED Course  I have reviewed the triage vital signs and the nursing notes.  Pertinent labs & imaging results that were available during my care of the patient were reviewed by me and considered in my medical decision making (see chart for details).    Penny Gibson is a 72 y.o. female here with SOB, vomiting after getting IV iron infusion. Had previous allergic reaction. Likely bad allergic reaction or anaphylaxis. Given epi x 2 by oncology center. Will repeat labs. Will monitor for 3-4 hrs. OP clear currently and talking in full sentences.   2:40 PM Observed for 3 hrs in the ED and OP clear, lungs clear. WBC 20 likely from steroids and epinephrine. EKG unremarkable. Will dc home with epi pen, prednisone. Will have her follow up with oncology.   Final Clinical Impressions(s) / ED Diagnoses   Final diagnoses:  None    ED Discharge Orders    None       Drenda Freeze, MD 10/13/17 1441

## 2017-10-13 NOTE — Telephone Encounter (Signed)
Appointment scheduled left message for patient per 4/25 sch msg

## 2017-10-13 NOTE — Discharge Instructions (Signed)
Take prednisone as prescribed.   Keep Epi pen with you at all times. Give yourself a shot if you have trouble breathing, throat closing.   See your oncologist for follow up. Avoid iron infusions  Return to ER if you have trouble breathing, rash, throat closing, passing out.

## 2017-10-13 NOTE — Telephone Encounter (Signed)
Scheduled appt per 4/25 los - Gave patient AVS and calender per los.

## 2017-10-14 NOTE — Telephone Encounter (Signed)
No sweater located on round pic-nic table outside [facility] yesterday evening.  This morning [facility] treatment area holdng or received sweater.  Next scheduled F/U 10-20-2017.

## 2017-10-14 NOTE — Progress Notes (Signed)
   DATE: 10/13/2017     X CHEMO/IMMUNOTHERAPY REACTION          MD: Delanna Ahmadi   AGENT/BLOOD PRODUCT RECEIVING TODAY:   Feraheme  AGENT/BLOOD PRODUCT RECEIVING IMMEDIATELY PRIOR TO REACTION: Feraheme  REACTION(S): Apnea, hypoxia, decreased levels of consciousness, chest pain, and vomiting  PREMEDS:  X Benadryl (25 mg) X Pepcid (20 mg)    X Solu-Medrol (40 mg IV)  INTERVENTION:  X Benadryl, 50 mg IV x1 X Epinephrine 0.25 mg of 1 mg/10 ML x 2    X Solumedrol 125 mg x 1   X Supplemental Oxygen   X CODE BLUE CALLED   Review of Systems  Constitutional: Negative for diaphoresis.  Respiratory: Positive for apnea and shortness of breath. Negative for cough, choking, chest tightness and wheezing.   Cardiovascular: Positive for chest pain. Negative for palpitations.  Gastrointestinal: Positive for vomiting. Negative for nausea.  Skin: Positive for color change. Negative for rash.  Neurological: Positive for syncope and speech difficulty. Negative for dizziness, light-headedness and headaches.    Physical Exam  Constitutional: She appears distressed.  HENT:  Head: Normocephalic and atraumatic.  Cardiovascular: Normal rate, regular rhythm and normal heart sounds. Exam reveals no gallop and no friction rub.  No murmur heard. Pulmonary/Chest: She is in respiratory distress. She has no wheezes. She has no rales.  Breath sounds were shallow but present when the patient was first evaluated by this provider.  Breath sounds returned to normal as the patient's level of consciousness improved.  Neurological: She is unresponsive.  The patient was initially unresponsive but then began opening her eyes to painful stimuli, then verbal stimuli, then she responded appropriately to questions that were asked after she began to respond to the medications that were given for intervention of her infusion reaction.  Skin: She is not diaphoretic.  Cyanosis of the lips and perioral area noted.      OUTCOME: X Patient transported to ER for management  Sandi Mealy, MHS, PA-C

## 2017-10-16 NOTE — Progress Notes (Signed)
[facility] Follow-up Visit:  Assessment: Iron deficiency anemia 72 y.o. 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  Patient could not tolerate ferrous sulfate medication due to gastrointestinal symptoms.  Patient was started on parenteral iron supplementation as she was unable to tolerate oral replacement therapy.  First dose of Feraheme proceeded without complications, patient did have a reaction to the second dose about reaction abated with additional medications and patient completed the infusion.  Interim labs demonstrate improving hemoglobin, but ferritin values are dropping significantly between the treatments.  Part of that is the iron consumption for the hemoglobin production, but we cannot exclude ongoing blood loss diminishing benefit from the iron therapy.  Plan: - Consult gastroenterology for possible EGD/colonoscopy to assess for possible blood loss sites. -Proceed with additional dose of Feraheme today.  Considering previous reaction, patient will receive premedications with diphenhydramine, Pepcid, and 40 mg of Solu-Medrol.  Our goal is to obtain and maintain ferritin of 50 -Return to clinic in 2 months with lab work 2 days prior, clinic visit, possible additional Feraheme infusion.    Voice recognition software was used and creation of this note. Despite my best effort at editing the text, some misspelling/errors may have occurred.  Orders Placed This Encounter  Procedures  . CBC with Differential (Cancer Center Only)    Standing Status:   Future    Standing Expiration Date:   10/14/2018  . CMP ([facility] only)    Standing Status:   Future    Standing Expiration Date:   10/14/2018  . Iron and TIBC    Standing Status:   Future    Standing Expiration Date:   10/14/2018  . Ferritin    Standing Status:   Future    Standing Expiration Date:    10/14/2018  . Ambulatory referral to Gastroenterology    Referral Priority:   Routine    Referral Type:   Consultation    Referral Reason:   Specialty Services Required    Number of Visits Requested:   1    Cancer Staging No matching staging information was found for the patient.  All questions were answered.  . The patient knows to call the clinic with any problems, questions or concerns.  This note was electronically signed.    History of Presenting Illness Penny Gibson is a 72 y.o. female followed in the [facility] for diagnosis of iron deficiency anemia, referred by Leana Gamer, NP.  Patient's past medical history is significant for osteoarthritis, hypertension.  Patient is taking aspirin, ibuprofen, and meloxicam.  Family history is significant for sister with breast cancer at age of 55, no other malignancy, bleeding, or abnormal clotting history in the family.  Patient has been previously prescribed oral iron, but could not tolerate ferrous sulfate due to abdominal pain, nausea, and constipation.  At the present time, she is scheduled to see gastroenterology on 08/08/17 for possible colonoscopy evaluation.  Patient returns to the clinic for continued hematological monitoring while receiving parenteral iron therapy for iron deficiency anemia.  Patient did have reaction to the last iron infusion, but recovered and was able to complete her infusion treatment according to the reports from the event.  In the interim, denies any new symptoms.  Feeling reasonably well.  Oncological/hematological History: --Labs, 05/28/16: Hgb 11.2, MCV 76.2, MCH     .Marland KitchenMarland Kitchen,  RDW 19.9, Plt 336;  --Labs, 06/28/17: Hgb   8.3, MCV 67.8, MCH 30.2,        RDW 21.9, Plt 483; Fe 30, FeSat   6%, Transferrin 347; --Labs, 07/19/17: Hgb   8.4, MCV 66.4, MCH 20.3, MCHC 30.6, RDW 22.5, Plt 446; Fe 25, FeSat   6%, TIBC 414, Ferritin   9 --Treatment:    --Feraheme 541m IV x1, 07/26/17  --Labs,  08/16/17: Hgb   9.3, MCV 77.4, MCH 22.9, MCHC 29.5, RDW 26.9, Plt 337; Fe 32, FeSat 10%, TIBC 305, Ferritin 92    --Feraheme 5150mIV x1, 0278/29/56Complicated by infusion reaction including lightheadedness, hypotension, chest tightness, throat tightness, shortness of breath, and mild hypoxia.  Patient received Benadryl 25 mg IV, Solu-Medrol 125 mg IV, Pepcid 20 mg IV, and albuterol nebulizer and was able to complete the infusion without further difficulties. --Labs, 10/11/17: Hgb 10.7, MCV 77.9, MCH 24.9, MCHC 32.0, RDW 25.8, Plt 298; Fe 33, FeSat 10%, TIBC 335, Ferritin 26   --Feraheme 51084mV x1, 10/13/17:   No history exists.    Medical History: Past Medical History:  Diagnosis Date  . Anemia   . Arthritis   . Hyperlipidemia   . Hypertension     Surgical History: Past Surgical History:  Procedure Laterality Date  . BREAST BIOPSY Left 2010  . BREAST LUMPECTOMY Left 2010  . TUBAL LIGATION      Family History: Family History  Problem Relation Age of Onset  . Heart attack Mother   . Heart disease Mother   . Cancer Sister   . Breast cancer Sister 44 36 Stroke Maternal Grandmother     Social History: Social History   Socioeconomic History  . Marital status: Divorced    Spouse name: Not on file  . Number of children: Not on file  . Years of education: Not on file  . Highest education level: Not on file  Occupational History  . Not on file  Social Needs  . Financial resource strain: Not on file  . Food insecurity:    Worry: Not on file    Inability: Not on file  . Transportation needs:    Medical: Not on file    Non-medical: Not on file  Tobacco Use  . Smoking status: Never Smoker  . Smokeless tobacco: Never Used  Substance and Sexual Activity  . Alcohol use: No  . Drug use: No  . Sexual activity: Never    Birth control/protection: Surgical  Lifestyle  . Physical activity:    Days per week: Not on file    Minutes per session: Not on file  . Stress: Not  on file  Relationships  . Social connections:    Talks on phone: Not on file    Gets together: Not on file    Attends religious service: Not on file    Active member of club or organization: Not on file    Attends meetings of clubs or organizations: Not on file    Relationship status: Not on file  . Intimate partner violence:    Fear of current or ex partner: Not on file    Emotionally abused: Not on file    Physically abused: Not on file    Forced sexual activity: Not on file  Other Topics Concern  . Not on file  Social History Narrative  . Not on file    Allergies: Allergies  Allergen Reactions  . Feraheme [Ferumoxytol] Anaphylaxis  . Penicillins Anaphylaxis  Medications:  Current Outpatient Medications  Medication Sig Dispense Refill  . albuterol (PROAIR HFA) 108 (90 Base) MCG/ACT inhaler Inhale 2 puffs into the lungs every 6 (six) hours as needed for wheezing or shortness of breath. 1 Inhaler 5  . aspirin 81 MG tablet Take 81 mg by mouth daily.    . budesonide-formoterol (SYMBICORT) 160-4.5 MCG/ACT inhaler Inhale 1 puff into the lungs 2 (two) times daily. Rinse mouth after use 1 Inhaler 5  . cetirizine (ZYRTEC) 10 MG tablet Take 1 tablet (10 mg total) by mouth daily. 30 tablet 2  . hydrochlorothiazide (MICROZIDE) 12.5 MG capsule Take 12.5 mg by mouth daily.    Marland Kitchen ibuprofen (ADVIL,MOTRIN) 200 MG tablet Take 1 tablet (200 mg total) by mouth every 8 (eight) hours as needed. With food 30 tablet 0  . ipratropium (ATROVENT) 0.03 % nasal spray Place 2 sprays into both nostrils 2 (two) times daily. Do not use for more than 5days. (Patient not taking: Reported on 10/13/2017) 30 mL 0  . lisinopril (PRINIVIL,ZESTRIL) 40 MG tablet Take 1 tablet (40 mg total) by mouth daily. 90 tablet 2  . meloxicam (MOBIC) 7.5 MG tablet Take 1 tablet (7.5 mg total) by mouth daily as needed for pain. With food (Patient not taking: Reported on 08/08/2017) 30 tablet 1  . mometasone (NASONEX) 50 MCG/ACT  nasal spray Place 2 sprays into the nose daily. (Patient not taking: Reported on 10/13/2017) 17 g 12  . predniSONE (DELTASONE) 20 MG tablet Take 60 mg daily x 2 days then 40 mg daily x 2 days then 20 mg daily x 2 days 12 tablet 0   No current facility-administered medications for this visit.     Review of Systems: Review of Systems  Constitutional: Positive for fatigue.  All other systems reviewed and are negative.    PHYSICAL EXAMINATION Blood pressure (!) 166/77, pulse 93, temperature 98.4 F (36.9 C), temperature source Oral, resp. rate 18, height 5' 2"  (1.575 m), weight 220 lb 12.8 oz (100.2 kg), SpO2 95 %.  ECOG PERFORMANCE STATUS: 1 - Symptomatic but completely ambulatory  Physical Exam  Constitutional: She is oriented to person, place, and time and well-developed, well-nourished, and in no distress. No distress.  HENT:  Head: Normocephalic and atraumatic.  Mouth/Throat: Oropharynx is clear and moist. No oropharyngeal exudate.  Eyes: Pupils are equal, round, and reactive to light. Conjunctivae and EOM are normal. No scleral icterus.  Neck: No thyromegaly present.  Cardiovascular: Normal rate, regular rhythm and normal heart sounds.  No murmur heard. Pulmonary/Chest: Effort normal and breath sounds normal. No respiratory distress. She has no wheezes. She has no rales.  Abdominal: Soft. Bowel sounds are normal. She exhibits no distension and no mass. There is no tenderness. There is no guarding.  Musculoskeletal: She exhibits no edema.  Lymphadenopathy:    She has no cervical adenopathy.  Neurological: She is alert and oriented to person, place, and time. She has normal reflexes. No cranial nerve deficit.  Skin: Skin is warm and dry. No rash noted. She is not diaphoretic. No erythema. There is pallor.     LABORATORY DATA: I have personally reviewed the data as listed: Admission on 10/13/2017, Discharged on 10/13/2017  Component Date Value Ref Range Status  . WBC 10/13/2017  20.9* 4.0 - 10.5 K/uL Final  . RBC 10/13/2017 4.24  3.87 - 5.11 MIL/uL Final  . Hemoglobin 10/13/2017 10.9* 12.0 - 15.0 g/dL Final  . HCT 10/13/2017 35.0* 36.0 - 46.0 % Final  .  MCV 10/13/2017 82.5  78.0 - 100.0 fL Final  . MCH 10/13/2017 25.7* 26.0 - 34.0 pg Final  . MCHC 10/13/2017 31.1  30.0 - 36.0 g/dL Final  . RDW 10/13/2017 23.4* 11.5 - 15.5 % Final  . Platelets 10/13/2017 303  150 - 400 K/uL Final  . Neutrophils Relative % 10/13/2017 74  % Final  . Lymphocytes Relative 10/13/2017 22  % Final  . Monocytes Relative 10/13/2017 3  % Final  . Eosinophils Relative 10/13/2017 1  % Final  . Basophils Relative 10/13/2017 0  % Final  . Neutro Abs 10/13/2017 15.5* 1.7 - 7.7 K/uL Final  . Lymphs Abs 10/13/2017 4.6* 0.7 - 4.0 K/uL Final  . Monocytes Absolute 10/13/2017 0.6  0.1 - 1.0 K/uL Final  . Eosinophils Absolute 10/13/2017 0.2  0.0 - 0.7 K/uL Final  . Basophils Absolute 10/13/2017 0.0  0.0 - 0.1 K/uL Final  . WBC Morphology 10/13/2017 MILD LEFT SHIFT (1-5% METAS, OCC MYELO, OCC BANDS)   Final   Performed at [facility] [address] [place], [place] [address]  . Sodium 10/13/2017 142  135 - 145 mmol/L Final  . Potassium 10/13/2017 3.8  3.5 - 5.1 mmol/L Final  . Chloride 10/13/2017 106  101 - 111 mmol/L Final  . CO2 10/13/2017 23  22 - 32 mmol/L Final  . Glucose, Bld 10/13/2017 154* 65 - 99 mg/dL Final  . BUN 10/13/2017 12  6 - 20 mg/dL Final  . Creatinine, Ser 10/13/2017 0.69  0.44 - 1.00 mg/dL Final  . Calcium 10/13/2017 9.0  8.9 - 10.3 mg/dL Final  . GFR calc non Af Amer 10/13/2017 >60  >60 mL/min Final  . GFR calc Af Amer 10/13/2017 >60  >60 mL/min Final   Comment: (NOTE) The eGFR has been calculated using the CKD EPI equation. This calculation has not been validated in all clinical situations. eGFR's persistently <60 mL/min signify possible Chronic Kidney Disease.   Georgiann Hahn gap 10/13/2017 13  5 - 15 Final   Performed at [facility],  [place] [address], [place], [place] [address]  . Glucose-Capillary 10/13/2017 131* 65 - 99 mg/dL Final  Appointment on 10/11/2017  Component Date Value Ref Range Status  . Ferritin 10/11/2017 26  9 - 269 ng/mL Final   Performed at [facility], [place] [address], [place], [place] [address]  . Iron 10/11/2017 33* 41 - 142 ug/dL Final  . TIBC 10/11/2017 335  236 - 444 ug/dL Final  . Saturation Ratios 10/11/2017 10* 21 - 57 % Final  . UIBC 10/11/2017 302  ug/dL Final   Performed at [facility], [place] [address], [place], [place] [address]  . Sodium 10/11/2017 139  136 - 145 mmol/L Final  . Potassium 10/11/2017 3.4* 3.5 - 5.1 mmol/L Final  . Chloride 10/11/2017 103  98 - 109 mmol/L Final  . CO2 10/11/2017 26  22 - 29 mmol/L Final  . Glucose, Bld 10/11/2017 98  70 - 140 mg/dL Final  . BUN 10/11/2017 16  7 - 26 mg/dL Final  . Creatinine 10/11/2017 0.77  0.60 - 1.10 mg/dL Final  . Calcium 10/11/2017 9.4  8.4 - 10.4 mg/dL Final  . Total Protein 10/11/2017 8.2  6.4 - 8.3 g/dL Final  . Albumin 10/11/2017 3.7  3.5 - 5.0 g/dL Final  . AST 10/11/2017 16  5 - 34 U/L Final  . ALT 10/11/2017 13  0 - 55 U/L Final  .  Alkaline Phosphatase 10/11/2017 90  40 - 150 U/L Final  . Total Bilirubin 10/11/2017 0.3  0.2 - 1.2 mg/dL Final  . GFR, Est Non Af Am 10/11/2017 >60  >60 mL/min Final  . GFR, Est AFR Am 10/11/2017 >60  >60 mL/min Final   Comment: (NOTE) The eGFR has been calculated using the CKD EPI equation. This calculation has not been validated in all clinical situations. eGFR's persistently <60 mL/min signify possible Chronic Kidney Disease.   Georgiann Hahn gap 10/11/2017 10  3 - 11 Final   Performed at [facility], [place] [address] [place], [place] [address]  . WBC Count 10/11/2017 9.5  3.9 - 10.3 K/uL Final  . RBC 10/11/2017 4.31  3.70 - 5.45 MIL/uL Final  . Hemoglobin 10/11/2017 10.7* 11.6 - 15.9 g/dL Final  . HCT 10/11/2017 33.5*  34.8 - 46.6 % Final  . MCV 10/11/2017 77.9* 79.5 - 101.0 fL Final  . MCH 10/11/2017 24.9* 25.1 - 34.0 pg Final  . MCHC 10/11/2017 32.0  31.5 - 36.0 g/dL Final  . RDW 10/11/2017 25.8* 11.2 - 14.5 % Final  . Platelet Count 10/11/2017 298  145 - 400 K/uL Final  . Smear Review 10/11/2017 PLATELET COUNT CONFIRMED BY SMEAR   Final   few large platelets and target cells on smear  . Neutrophils Relative % 10/11/2017 73  % Final  . Neutro Abs 10/11/2017 6.9* 1.5 - 6.5 K/uL Final  . Lymphocytes Relative 10/11/2017 19  % Final  . Lymphs Abs 10/11/2017 1.8  0.9 - 3.3 K/uL Final  . Monocytes Relative 10/11/2017 6  % Final  . Monocytes Absolute 10/11/2017 0.5  0.1 - 0.9 K/uL Final  . Eosinophils Relative 10/11/2017 1  % Final  . Eosinophils Absolute 10/11/2017 0.1  0.0 - 0.5 K/uL Final  . Basophils Relative 10/11/2017 1  % Final  . Basophils Absolute 10/11/2017 0.1  0.0 - 0.1 K/uL Final   Performed at [facility], [place] [address], [place], [place] [address]       Ardath Sax, MD

## 2017-10-16 NOTE — Assessment & Plan Note (Signed)
72 y.o. 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  Patient could not tolerate ferrous sulfate medication due to gastrointestinal symptoms.  Patient was started on parenteral iron supplementation as she was unable to tolerate oral replacement therapy.  First dose of Feraheme proceeded without complications, patient did have a reaction to the second dose about reaction abated with additional medications and patient completed the infusion.  Interim labs demonstrate improving hemoglobin, but ferritin values are dropping significantly between the treatments.  Part of that is the iron consumption for the hemoglobin production, but we cannot exclude ongoing blood loss diminishing benefit from the iron therapy.  Plan: - Consult gastroenterology for possible EGD/colonoscopy to assess for possible blood loss sites. -Proceed with additional dose of Feraheme today.  Considering previous reaction, patient will receive premedications with diphenhydramine, Pepcid, and 40 mg of Solu-Medrol.  Our goal is to obtain and maintain ferritin of 50 -Return to clinic in 2 months with lab work 2 days prior, clinic visit, possible additional Feraheme infusion.

## 2017-10-20 ENCOUNTER — Inpatient Hospital Stay: Payer: Medicare Other | Admitting: Hematology and Oncology

## 2017-10-20 ENCOUNTER — Inpatient Hospital Stay: Payer: Medicare Other | Attending: Hematology and Oncology

## 2017-10-21 ENCOUNTER — Ambulatory Visit: Payer: Medicaid Other | Admitting: Hematology and Oncology

## 2017-10-27 ENCOUNTER — Other Ambulatory Visit: Payer: Self-pay | Admitting: Family Medicine

## 2017-10-27 DIAGNOSIS — I10 Essential (primary) hypertension: Secondary | ICD-10-CM

## 2017-11-04 ENCOUNTER — Encounter: Payer: Self-pay | Admitting: Nurse Practitioner

## 2017-11-04 ENCOUNTER — Ambulatory Visit (INDEPENDENT_AMBULATORY_CARE_PROVIDER_SITE_OTHER): Payer: Medicare Other | Admitting: Nurse Practitioner

## 2017-11-04 VITALS — BP 136/86 | HR 99 | Temp 98.3°F | Ht 62.0 in | Wt 220.0 lb

## 2017-11-04 DIAGNOSIS — I10 Essential (primary) hypertension: Secondary | ICD-10-CM

## 2017-11-04 DIAGNOSIS — D500 Iron deficiency anemia secondary to blood loss (chronic): Secondary | ICD-10-CM | POA: Diagnosis not present

## 2017-11-04 DIAGNOSIS — J4520 Mild intermittent asthma, uncomplicated: Secondary | ICD-10-CM | POA: Diagnosis not present

## 2017-11-04 DIAGNOSIS — J302 Other seasonal allergic rhinitis: Secondary | ICD-10-CM | POA: Diagnosis not present

## 2017-11-04 MED ORDER — ALBUTEROL SULFATE HFA 108 (90 BASE) MCG/ACT IN AERS: 2.0000 | INHALATION_SPRAY | Freq: Four times a day (QID) | RESPIRATORY_TRACT | 5 refills | Status: DC | PRN

## 2017-11-04 MED ORDER — FERROUS SULFATE ER 142 (45 FE) MG PO TBCR: 1.0000 | EXTENDED_RELEASE_TABLET | Freq: Every day | ORAL | Status: DC

## 2017-11-04 MED ORDER — BUDESONIDE-FORMOTEROL FUMARATE 160-4.5 MCG/ACT IN AERO: 1.0000 | INHALATION_SPRAY | Freq: Two times a day (BID) | RESPIRATORY_TRACT | 5 refills | Status: DC

## 2017-11-04 MED ORDER — CETIRIZINE HCL 10 MG PO TABS: 10.0000 mg | ORAL_TABLET | Freq: Every day | ORAL | 1 refills | Status: DC

## 2017-11-04 MED ORDER — HYDROCHLOROTHIAZIDE 12.5 MG PO CAPS: 12.5000 mg | ORAL_CAPSULE | Freq: Every day | ORAL | 1 refills | Status: DC

## 2017-11-04 MED ORDER — LISINOPRIL 40 MG PO TABS: 40.0000 mg | ORAL_TABLET | Freq: Every day | ORAL | 1 refills | Status: DC

## 2017-11-04 MED ORDER — AZELASTINE HCL 0.1 % NA SOLN: 1.0000 | Freq: Two times a day (BID) | NASAL | 11 refills | Status: DC

## 2017-11-04 NOTE — Progress Notes (Signed)
Subjective:  Patient ID: Penny Gibson, female    DOB: 09/06/1945  Age: 72 y.o. MRN: [id number]  CC: Follow-up (5 mo follow up/med consult and refill zyrtec and hydrochhlorothiazide?)  HPI HTN: Improved with lisinopril and HCTZ. HCTZ was initially discontinued by patient due to increased urination. She resumed medication, due  To elevated BP. BP Readings from Last 3 Encounters:  11/04/17 136/86  10/13/17 (!) 171/93  10/13/17 (!) 166/77   Anemia: 2anaphylactic reactions to iron infusion. She no longer wants iron infusion even with pre medication. She will like to resume oral ferrous sulfate supplement (Slow Fe OTC).   Asthma: Stable with use of symbicort, and albuterol Needs refill.  Allergic rhinitis. No improvement with nasonex. Will like another nasal spray and zyrtec Rx.  Outpatient Medications Prior to Visit  Medication Sig Dispense Refill  . aspirin 81 MG tablet Take 81 mg by mouth daily.    Marland Kitchen ibuprofen (ADVIL,MOTRIN) 200 MG tablet Take 1 tablet (200 mg total) by mouth every 8 (eight) hours as needed. With food 30 tablet 0  . ipratropium (ATROVENT) 0.03 % nasal spray Place 2 sprays into both nostrils 2 (two) times daily. Do not use for more than 5days. 30 mL 0  . mometasone (NASONEX) 50 MCG/ACT nasal spray Place 2 sprays into the nose daily. 17 g 12  . predniSONE (DELTASONE) 20 MG tablet Take 60 mg daily x 2 days then 40 mg daily x 2 days then 20 mg daily x 2 days 12 tablet 0  . albuterol (PROAIR HFA) 108 (90 Base) MCG/ACT inhaler Inhale 2 puffs into the lungs every 6 (six) hours as needed for wheezing or shortness of breath. 1 Inhaler 5  . budesonide-formoterol (SYMBICORT) 160-4.5 MCG/ACT inhaler Inhale 1 puff into the lungs 2 (two) times daily. Rinse mouth after use 1 Inhaler 5  . cetirizine (ZYRTEC) 10 MG tablet Take 1 tablet (10 mg total) by mouth daily. 30 tablet 2  . hydrochlorothiazide (MICROZIDE) 12.5 MG capsule Take 12.5 mg by mouth daily.    Marland Kitchen  lisinopril (PRINIVIL,ZESTRIL) 40 MG tablet Take 1 tablet (40 mg total) by mouth daily. 90 tablet 2  . meloxicam (MOBIC) 7.5 MG tablet Take 1 tablet (7.5 mg total) by mouth daily as needed for pain. With food (Patient not taking: Reported on 08/08/2017) 30 tablet 1   No facility-administered medications prior to visit.     ROS See HPI  Objective:  BP 136/86   Pulse 99   Temp 98.3 F (36.8 C) (Oral)   Ht 5\' 2"  (1.575 m)   Wt 220 lb (99.8 kg)   SpO2 96%   BMI 40.24 kg/m   BP Readings from Last 3 Encounters:  11/04/17 136/86  10/13/17 (!) 171/93  10/13/17 (!) 166/77    Wt Readings from Last 3 Encounters:  11/04/17 220 lb (99.8 kg)  10/13/17 220 lb (99.8 kg)  10/13/17 220 lb 12.8 oz (100.2 kg)    Physical Exam  Constitutional: She is oriented to person, place, and time. No distress.  Neck: Normal range of motion. Neck supple.  Cardiovascular: Normal rate and regular rhythm.  Pulmonary/Chest: Effort normal and breath sounds normal.  Abdominal: Soft. Bowel sounds are normal.  Musculoskeletal: She exhibits no edema.  Neurological: She is alert and oriented to person, place, and time.  Skin: Skin is warm and dry. No rash noted.  Vitals reviewed.   Lab Results  Component Value Date   WBC 20.9 (H) 10/13/2017   HGB 10.9 (  L) 10/13/2017   HCT 35.0 (L) 10/13/2017   PLT 303 10/13/2017   GLUCOSE 154 (H) 10/13/2017   CHOL 188 09/24/2016   TRIG 148.0 09/24/2016   HDL 44.90 09/24/2016   LDLCALC 113 (H) 09/24/2016   ALT 13 10/11/2017   AST 16 10/11/2017   NA 142 10/13/2017   K 3.8 10/13/2017   CL 106 10/13/2017   CREATININE 0.69 10/13/2017   BUN 12 10/13/2017   CO2 23 10/13/2017   TSH 1.333 10/18/2007   HGBA1C 5.7 (H) 08/08/2017    Assessment & Plan:   Penny Gibson was seen today for follow-up.  Diagnoses and all orders for this visit:  Essential hypertension -     lisinopril (PRINIVIL,ZESTRIL) 40 MG tablet; Take 1 tablet (40 mg total) by mouth daily. -      hydrochlorothiazide (MICROZIDE) 12.5 MG capsule; Take 1 capsule (12.5 mg total) by mouth daily.  Other seasonal allergic rhinitis -     cetirizine (ZYRTEC) 10 MG tablet; Take 1 tablet (10 mg total) by mouth daily. -     azelastine (ASTELIN) 0.1 % nasal spray; Place 1 spray into both nostrils 2 (two) times daily. Use in each nostril as directed  Mild intermittent asthma without complication -     cetirizine (ZYRTEC) 10 MG tablet; Take 1 tablet (10 mg total) by mouth daily. -     budesonide-formoterol (SYMBICORT) 160-4.5 MCG/ACT inhaler; Inhale 1 puff into the lungs 2 (two) times daily. Rinse mouth after use -     albuterol (PROAIR HFA) 108 (90 Base) MCG/ACT inhaler; Inhale 2 puffs into the lungs every 6 (six) hours as needed for wheezing or shortness of breath.  Iron deficiency anemia due to chronic blood loss -     Ferrous Sulfate (SLOW FE) 142 (45 Fe) MG TBCR; Take 1 tablet by mouth daily.   I have changed Penny Gibson's hydrochlorothiazide. I am also having her start on azelastine and Ferrous Sulfate. Additionally, I am having her maintain her aspirin, mometasone, ibuprofen, ipratropium, meloxicam, predniSONE, cetirizine, lisinopril, budesonide-formoterol, and albuterol.  Meds ordered this encounter  Medications  . cetirizine (ZYRTEC) 10 MG tablet    Sig: Take 1 tablet (10 mg total) by mouth daily.    Dispense:  90 tablet    Refill:  1    Order Specific Question:   Supervising Provider    Answer:   Lucille Passy [[id number]]  . lisinopril (PRINIVIL,ZESTRIL) 40 MG tablet    Sig: Take 1 tablet (40 mg total) by mouth daily.    Dispense:  90 tablet    Refill:  1    Order Specific Question:   Supervising Provider    Answer:   Lucille Passy [[id number]]  . budesonide-formoterol (SYMBICORT) 160-4.5 MCG/ACT inhaler    Sig: Inhale 1 puff into the lungs 2 (two) times daily. Rinse mouth after use    Dispense:  1 Inhaler    Refill:  5    Order Specific Question:   Supervising Provider    Answer:    Lucille Passy [[id number]]  . albuterol (PROAIR HFA) 108 (90 Base) MCG/ACT inhaler    Sig: Inhale 2 puffs into the lungs every 6 (six) hours as needed for wheezing or shortness of breath.    Dispense:  1 Inhaler    Refill:  5    Order Specific Question:   Supervising Provider    Answer:   Lucille Passy [[id number]]  . hydrochlorothiazide (MICROZIDE) 12.5 MG capsule  Sig: Take 1 capsule (12.5 mg total) by mouth daily.    Dispense:  90 capsule    Refill:  1    Order Specific Question:   Supervising Provider    Answer:   Lucille Passy [[id number]]  . azelastine (ASTELIN) 0.1 % nasal spray    Sig: Place 1 spray into both nostrils 2 (two) times daily. Use in each nostril as directed    Dispense:  30 mL    Refill:  11    Order Specific Question:   Supervising Provider    Answer:   Lucille Passy [[id number]]  . Ferrous Sulfate (SLOW FE) 142 (45 Fe) MG TBCR    Sig: Take 1 tablet by mouth daily.    Dispense:  30 tablet    Order Specific Question:   Supervising Provider    Answer:   Lucille Passy [[id number]]    Follow-up: Return in about 3 months (around 02/04/2018) for HTN, asthma (fasting) 48mins appt.  [name], NP

## 2017-11-06 ENCOUNTER — Encounter: Payer: Self-pay | Admitting: Nurse Practitioner

## 2017-11-07 ENCOUNTER — Ambulatory Visit: Payer: Medicare Other | Admitting: Nurse Practitioner

## 2017-11-15 ENCOUNTER — Encounter: Payer: Self-pay | Admitting: Hematology and Oncology

## 2017-11-21 ENCOUNTER — Telehealth: Payer: Self-pay | Admitting: Hematology and Oncology

## 2017-11-21 NOTE — Telephone Encounter (Signed)
Called pt re appts being moved to [facility] - left vm for pt and sent confirmation letter in the mail.

## 2017-11-28 ENCOUNTER — Telehealth: Payer: Self-pay | Admitting: Medical Oncology

## 2017-11-28 NOTE — Telephone Encounter (Signed)
Pt said her PCP told her to cancel all her feraheme  infusions because she is allergic to it. Pt said her PCP will follow her for her anemia and her PCP put her on oral iron. appts cancelled.

## 2017-12-19 ENCOUNTER — Other Ambulatory Visit: Payer: Medicaid Other

## 2017-12-21 ENCOUNTER — Ambulatory Visit: Payer: Medicaid Other | Admitting: Hematology and Oncology

## 2017-12-21 ENCOUNTER — Ambulatory Visit: Payer: Medicaid Other

## 2017-12-26 ENCOUNTER — Ambulatory Visit: Payer: Medicaid Other

## 2017-12-26 ENCOUNTER — Ambulatory Visit: Payer: Medicaid Other | Admitting: Internal Medicine

## 2018-02-09 ENCOUNTER — Other Ambulatory Visit: Payer: Self-pay | Admitting: Nurse Practitioner

## 2018-02-09 DIAGNOSIS — J302 Other seasonal allergic rhinitis: Secondary | ICD-10-CM

## 2018-02-13 ENCOUNTER — Ambulatory Visit: Payer: Self-pay | Admitting: *Deleted

## 2018-02-13 NOTE — Telephone Encounter (Signed)
Pt called with having some dizziness and being lightheaded for the last 3 days. She states that she recently had cataract surgery and was thinking it was from that. But has seen her eye provider and he said every thing was fine. She stated that she has had vertigo in the past. Not on any medication for this. Now she thinks it may be related to her sinus. She has a hx of getting a sinus infection a couple times a year. Denies fever, chest pain, headache or shortness of breath. Appointment scheduled per pt regards to using [facility]. Pt had to take another call (eye doctor) so will give her a call back to verify her appointment and home care advice. Unable to get patient on the phone. Left her a message regarding her appointment and to call back for any questions.  Pt returned call and is in agreement with the appointment and home care.  Reason for Disposition . [1] MODERATE dizziness (e.g., interferes with normal activities) AND [2] has NOT been evaluated by physician for this  (Exception: dizziness caused by heat exposure, sudden standing, or poor fluid intake)  Answer Assessment - Initial Assessment Questions 1. DESCRIPTION: "Describe your dizziness."     lighthead 2. LIGHTHEADED: "Do you feel lightheaded?" (e.g., somewhat faint, woozy, weak upon standing)     A little light headed 3. VERTIGO: "Do you feel like either you or the room is spinning or tilting?" (i.e. vertigo)     no 4. SEVERITY: "How bad is it?"  "Do you feel like you are going to faint?" "Can you stand and walk?"   - MILD - walking normally   - MODERATE - interferes with normal activities (e.g., work, school)    - SEVERE - unable to stand, requires support to walk, feels like passing out now.      mild 5. ONSET:  "When did the dizziness begin?"     3 days ago 6. AGGRAVATING FACTORS: "Does anything make it worse?" (e.g., standing, change in head position)     A little bit of dizziness when turning head side to side 7. HEART RATE:  "Can you tell me your heart rate?" "How many beats in 15 seconds?"  (Note: not all patients can do this)       no 8. CAUSE: "What do you think is causing the dizziness?"     Sinus infection possibly 9. RECURRENT SYMPTOM: "Have you had dizziness before?" If so, ask: "When was the last time?" "What happened that time?"     Yes a long time ago 10. OTHER SYMPTOMS: "Do you have any other symptoms?" (e.g., fever, chest pain, vomiting, diarrhea, bleeding)       Nausea  Protocols used: DIZZINESS Heidi Dach

## 2018-02-13 NOTE — Telephone Encounter (Signed)
FYI pt has an appt with [name] tomorrow 02/04/18.

## 2018-02-14 ENCOUNTER — Ambulatory Visit (INDEPENDENT_AMBULATORY_CARE_PROVIDER_SITE_OTHER): Payer: Medicare Other | Admitting: Nurse Practitioner

## 2018-02-14 ENCOUNTER — Encounter: Payer: Self-pay | Admitting: Nurse Practitioner

## 2018-02-14 VITALS — BP 142/98 | HR 85 | Temp 98.4°F | Ht 62.0 in | Wt 225.0 lb

## 2018-02-14 DIAGNOSIS — G8929 Other chronic pain: Secondary | ICD-10-CM

## 2018-02-14 DIAGNOSIS — J0140 Acute pansinusitis, unspecified: Secondary | ICD-10-CM | POA: Diagnosis not present

## 2018-02-14 DIAGNOSIS — I10 Essential (primary) hypertension: Secondary | ICD-10-CM | POA: Diagnosis not present

## 2018-02-14 DIAGNOSIS — M25562 Pain in left knee: Secondary | ICD-10-CM

## 2018-02-14 MED ORDER — METHYLPREDNISOLONE ACETATE 40 MG/ML IJ SUSP
40.0000 mg | Freq: Once | INTRAMUSCULAR | Status: AC
  Administered 2018-02-14: 40 mg via INTRAMUSCULAR

## 2018-02-14 MED ORDER — GUAIFENESIN ER 600 MG PO TB12: 600.0000 mg | ORAL_TABLET | Freq: Two times a day (BID) | ORAL | 0 refills | Status: DC | PRN

## 2018-02-14 MED ORDER — AZITHROMYCIN 250 MG PO TABS: 250.0000 mg | ORAL_TABLET | Freq: Every day | ORAL | 0 refills | Status: DC

## 2018-02-14 NOTE — Progress Notes (Signed)
Subjective:  Patient ID: Penny Gibson, female    DOB: 03/13/1946  Age: 72 y.o. MRN: [id number]  CC: Headache (pt is complaining of headache,ear full,painful near eye area and nausea. this has been going on 4 days. patient stated he has vertigo 2 wks ago and also had cataract surgery both eyes 2 mo ago. )  Dizziness  This is a new problem. The current episode started 1 to 4 weeks ago. The problem occurs intermittently. The problem has been waxing and waning. Associated symptoms include congestion, headaches, nausea, a sore throat, swollen glands and vertigo. Pertinent negatives include no anorexia, chills, coughing, diaphoresis, fatigue, fever, neck pain, numbness, rash, visual change or weakness. Associated symptoms comments: No eye pain, no change in vision. Treatments tried: nasonex, azelastine. The treatment provided no relief.   HTN: Waxing and waning. Does not exercise due to knee pain Does not restrict sodium intake. Reports she is complaint with lisinopril and HCTZ. BP Readings from Last 3 Encounters:  02/14/18 (!) 142/98  11/04/17 136/86  10/13/17 (!) 171/93   Knee pain: Chronic. Left worse that right. Evaluated by sports medicine. Left knee injection administered. Advised to use knee brace. Has not use knee brace.  Reviewed past Medical, Social and Family history today.  Outpatient Medications Prior to Visit  Medication Sig Dispense Refill  . albuterol (PROAIR HFA) 108 (90 Base) MCG/ACT inhaler Inhale 2 puffs into the lungs every 6 (six) hours as needed for wheezing or shortness of breath. 1 Inhaler 5  . aspirin 81 MG tablet Take 81 mg by mouth daily.    . budesonide-formoterol (SYMBICORT) 160-4.5 MCG/ACT inhaler Inhale 1 puff into the lungs 2 (two) times daily. Rinse mouth after use 1 Inhaler 5  . cetirizine (ZYRTEC) 10 MG tablet Take 1 tablet (10 mg total) by mouth daily. 90 tablet 1  . Ferrous Sulfate (SLOW FE) 142 (45 Fe) MG TBCR Take 1 tablet by mouth  daily. 30 tablet   . hydrochlorothiazide (MICROZIDE) 12.5 MG capsule Take 1 capsule (12.5 mg total) by mouth daily. 90 capsule 1  . ibuprofen (ADVIL,MOTRIN) 200 MG tablet Take 1 tablet (200 mg total) by mouth every 8 (eight) hours as needed. With food 30 tablet 0  . lisinopril (PRINIVIL,ZESTRIL) 40 MG tablet Take 1 tablet (40 mg total) by mouth daily. 90 tablet 1  . mometasone (NASONEX) 50 MCG/ACT nasal spray USE 2 SPRAYS IN EACH NOSTRIL DAILY 17 g 1  . moxifloxacin (VIGAMOX) 0.5 % ophthalmic solution 1 drop 3 (three) times daily.    . prednisoLONE acetate (PRED FORTE) 1 % ophthalmic suspension 1 drop 4 (four) times daily.    Marland Kitchen azelastine (ASTELIN) 0.1 % nasal spray Place 1 spray into both nostrils 2 (two) times daily. Use in each nostril as directed (Patient not taking: Reported on 02/14/2018) 30 mL 11  . ipratropium (ATROVENT) 0.03 % nasal spray Place 2 sprays into both nostrils 2 (two) times daily. Do not use for more than 5days. (Patient not taking: Reported on 02/14/2018) 30 mL 0  . meloxicam (MOBIC) 7.5 MG tablet Take 1 tablet (7.5 mg total) by mouth daily as needed for pain. With food (Patient not taking: Reported on 08/08/2017) 30 tablet 1  . predniSONE (DELTASONE) 20 MG tablet Take 60 mg daily x 2 days then 40 mg daily x 2 days then 20 mg daily x 2 days (Patient not taking: Reported on 02/14/2018) 12 tablet 0   No facility-administered medications prior to visit.  ROS See HPI  Objective:  BP (!) 142/98   Pulse 85   Temp 98.4 F (36.9 C) (Oral)   Ht 5\' 2"  (1.575 m)   Wt 225 lb (102.1 kg)   SpO2 96%   BMI 41.15 kg/m   BP Readings from Last 3 Encounters:  02/14/18 (!) 142/98  11/04/17 136/86  10/13/17 (!) 171/93    Wt Readings from Last 3 Encounters:  02/14/18 225 lb (102.1 kg)  11/04/17 220 lb (99.8 kg)  10/13/17 220 lb (99.8 kg)    Physical Exam  Constitutional: She is oriented to person, place, and time.  HENT:  Right Ear: Ear canal normal. No mastoid tenderness.  Tympanic membrane is not injected and not erythematous. A middle ear effusion is present.  Left Ear: Ear canal normal. No mastoid tenderness. Tympanic membrane is not injected and not erythematous. A middle ear effusion is present.  Nose: Mucosal edema and rhinorrhea present. Right sinus exhibits maxillary sinus tenderness and frontal sinus tenderness. Left sinus exhibits maxillary sinus tenderness and frontal sinus tenderness.  Mouth/Throat: Uvula is midline. She has dentures. No trismus in the jaw. Abnormal dentition. Posterior oropharyngeal erythema present. No oropharyngeal exudate.  Eyes: Pupils are equal, round, and reactive to light. Conjunctivae, EOM and lids are normal.  Neck: Normal range of motion. Neck supple.  Cardiovascular: Normal rate, regular rhythm and normal heart sounds.  Pulmonary/Chest: Effort normal and breath sounds normal. No respiratory distress.  Musculoskeletal: She exhibits edema.  Lymphadenopathy:    She has cervical adenopathy.  Neurological: She is alert and oriented to person, place, and time.  Skin: No rash noted.  Psychiatric: She has a normal mood and affect. Her behavior is normal.  Vitals reviewed.   Lab Results  Component Value Date   WBC 20.9 (H) 10/13/2017   HGB 10.9 (L) 10/13/2017   HCT 35.0 (L) 10/13/2017   PLT 303 10/13/2017   GLUCOSE 154 (H) 10/13/2017   CHOL 188 09/24/2016   TRIG 148.0 09/24/2016   HDL 44.90 09/24/2016   LDLCALC 113 (H) 09/24/2016   ALT 13 10/11/2017   AST 16 10/11/2017   NA 142 10/13/2017   K 3.8 10/13/2017   CL 106 10/13/2017   CREATININE 0.69 10/13/2017   BUN 12 10/13/2017   CO2 23 10/13/2017   TSH 1.333 10/18/2007   HGBA1C 5.7 (H) 08/08/2017    Assessment & Plan:   Jeneva was seen today for headache.  Diagnoses and all orders for this visit:  Acute non-recurrent pansinusitis -     methylPREDNISolone acetate (DEPO-MEDROL) injection 40 mg -     azithromycin (ZITHROMAX Z-PAK) 250 MG tablet; Take 1 tablet  (250 mg total) by mouth daily. Take 2tabs on first day, then 1tab once a day till complete -     guaiFENesin (MUCINEX) 600 MG 12 hr tablet; Take 1 tablet (600 mg total) by mouth 2 (two) times daily as needed for cough or to loosen phlegm.  Essential hypertension  Chronic pain of left knee   I have discontinued [name] C. Seib's ipratropium, meloxicam, predniSONE, azelastine, mometasone, moxifloxacin, and prednisoLONE acetate. I am also having her start on azithromycin and guaiFENesin. Additionally, I am having her maintain her aspirin, ibuprofen, cetirizine, lisinopril, budesonide-formoterol, albuterol, hydrochlorothiazide, and Ferrous Sulfate. We administered methylPREDNISolone acetate.  Meds ordered this encounter  Medications  . methylPREDNISolone acetate (DEPO-MEDROL) injection 40 mg  . azithromycin (ZITHROMAX Z-PAK) 250 MG tablet    Sig: Take 1 tablet (250 mg total) by mouth daily. Take  2tabs on first day, then 1tab once a day till complete    Dispense:  6 tablet    Refill:  0    Order Specific Question:   Supervising Provider    Answer:   Lucille Passy [[id number]]  . guaiFENesin (MUCINEX) 600 MG 12 hr tablet    Sig: Take 1 tablet (600 mg total) by mouth 2 (two) times daily as needed for cough or to loosen phlegm.    Dispense:  14 tablet    Refill:  0    Order Specific Question:   Supervising Provider    Answer:   Lucille Passy [[id number]]    Follow-up: Return if symptoms worsen or fail to improve.  [name], NP

## 2018-02-14 NOTE — Patient Instructions (Addendum)
Continue saline spary as needed Maintain adequate oral hydration with water.  Start water aerobic classes or recumbent stationery bicycle or chair exercise. Use Kne brace as instructed by Dr. Paulla Fore. Let me know if you want referral to ortho.  DASH Eating Plan DASH stands for "Dietary Approaches to Stop Hypertension." The DASH eating plan is a healthy eating plan that has been shown to reduce high blood pressure (hypertension). It may also reduce your risk for type 2 diabetes, heart disease, and stroke. The DASH eating plan may also help with weight loss. What are tips for following this plan? General guidelines  Avoid eating more than 2,300 mg (milligrams) of salt (sodium) a day. If you have hypertension, you may need to reduce your sodium intake to 1,500 mg a day.  Limit alcohol intake to no more than 1 drink a day for nonpregnant women and 2 drinks a day for men. One drink equals 12 oz of beer, 5 oz of wine, or 1 oz of hard liquor.  Work with your health care provider to maintain a healthy body weight or to lose weight. Ask what an ideal weight is for you.  Get at least 30 minutes of exercise that causes your heart to beat faster (aerobic exercise) most days of the week. Activities may include walking, swimming, or biking.  Work with your health care provider or diet and nutrition specialist (dietitian) to adjust your eating plan to your individual calorie needs. Reading food labels  Check food labels for the amount of sodium per serving. Choose foods with less than 5 percent of the Daily Value of sodium. Generally, foods with less than 300 mg of sodium per serving fit into this eating plan.  To find whole grains, look for the word "whole" as the first word in the ingredient list. Shopping  Buy products labeled as "low-sodium" or "no salt added."  Buy fresh foods. Avoid canned foods and premade or frozen meals. Cooking  Avoid adding salt when cooking. Use salt-free seasonings or  herbs instead of table salt or sea salt. Check with your health care provider or pharmacist before using salt substitutes.  Do not fry foods. Cook foods using healthy methods such as baking, boiling, grilling, and broiling instead.  Cook with heart-healthy oils, such as olive, canola, soybean, or sunflower oil. Meal planning   Eat a balanced diet that includes: ? 5 or more servings of fruits and vegetables each day. At each meal, try to fill half of your plate with fruits and vegetables. ? Up to 6-8 servings of whole grains each day. ? Less than 6 oz of lean meat, poultry, or fish each day. A 3-oz serving of meat is about the same size as a deck of cards. One egg equals 1 oz. ? 2 servings of low-fat dairy each day. ? A serving of nuts, seeds, or beans 5 times each week. ? Heart-healthy fats. Healthy fats called Omega-3 fatty acids are found in foods such as flaxseeds and coldwater fish, like sardines, salmon, and mackerel.  Limit how much you eat of the following: ? Canned or prepackaged foods. ? Food that is high in trans fat, such as fried foods. ? Food that is high in saturated fat, such as fatty meat. ? Sweets, desserts, sugary drinks, and other foods with added sugar. ? Full-fat dairy products.  Do not salt foods before eating.  Try to eat at least 2 vegetarian meals each week.  Eat more home-cooked food and less restaurant, buffet, and  fast food.  When eating at a restaurant, ask that your food be prepared with less salt or no salt, if possible. What foods are recommended? The items listed may not be a complete list. Talk with your dietitian about what dietary choices are best for you. Grains Whole-grain or whole-wheat bread. Whole-grain or whole-wheat pasta. Brown rice. [place] Morrow. Bulgur. Whole-grain and low-sodium cereals. Pita bread. Low-fat, low-sodium crackers. Whole-wheat flour tortillas. Vegetables Fresh or frozen vegetables (raw, steamed, roasted, or grilled).  Low-sodium or reduced-sodium tomato and vegetable juice. Low-sodium or reduced-sodium tomato sauce and tomato paste. Low-sodium or reduced-sodium canned vegetables. Fruits All fresh, dried, or frozen fruit. Canned fruit in natural juice (without added sugar). Meat and other protein foods Skinless chicken or [place]. Ground chicken or [place]. Pork with fat trimmed off. Fish and seafood. Egg whites. Dried beans, peas, or lentils. Unsalted nuts, nut butters, and seeds. Unsalted canned beans. Lean cuts of beef with fat trimmed off. Low-sodium, lean deli meat. Dairy Low-fat (1%) or fat-free (skim) milk. Fat-free, low-fat, or reduced-fat cheeses. Nonfat, low-sodium ricotta or cottage cheese. Low-fat or nonfat yogurt. Low-fat, low-sodium cheese. Fats and oils Soft margarine without trans fats. Vegetable oil. Low-fat, reduced-fat, or light mayonnaise and salad dressings (reduced-sodium). Canola, safflower, olive, soybean, and sunflower oils. Avocado. Seasoning and other foods Herbs. Spices. Seasoning mixes without salt. Unsalted popcorn and pretzels. Fat-free sweets. What foods are not recommended? The items listed may not be a complete list. Talk with your dietitian about what dietary choices are best for you. Grains Baked goods made with fat, such as croissants, muffins, or some breads. Dry pasta or rice meal packs. Vegetables Creamed or fried vegetables. Vegetables in a cheese sauce. Regular canned vegetables (not low-sodium or reduced-sodium). Regular canned tomato sauce and paste (not low-sodium or reduced-sodium). Regular tomato and vegetable juice (not low-sodium or reduced-sodium). Angie Fava. Olives. Fruits Canned fruit in a light or heavy syrup. Fried fruit. Fruit in cream or butter sauce. Meat and other protein foods Fatty cuts of meat. Ribs. Fried meat. Berniece Salines. Sausage. Bologna and other processed lunch meats. Salami. Fatback. Hotdogs. Bratwurst. Salted nuts and seeds. Canned beans with added  salt. Canned or smoked fish. Whole eggs or egg yolks. Chicken or [place] with skin. Dairy Whole or 2% milk, cream, and half-and-half. Whole or full-fat cream cheese. Whole-fat or sweetened yogurt. Full-fat cheese. Nondairy creamers. Whipped toppings. Processed cheese and cheese spreads. Fats and oils Butter. Stick margarine. Lard. Shortening. Ghee. Bacon fat. Tropical oils, such as coconut, palm kernel, or palm oil. Seasoning and other foods Salted popcorn and pretzels. Onion salt, garlic salt, seasoned salt, table salt, and sea salt. Worcestershire sauce. Tartar sauce. Barbecue sauce. Teriyaki sauce. Soy sauce, including reduced-sodium. Steak sauce. Canned and packaged gravies. Fish sauce. Oyster sauce. Cocktail sauce. Horseradish that you find on the shelf. Ketchup. Mustard. Meat flavorings and tenderizers. Bouillon cubes. Hot sauce and Tabasco sauce. Premade or packaged marinades. Premade or packaged taco seasonings. Relishes. Regular salad dressings. Where to find more information:  National Heart, Lung, and [place]: https://wilson-eaton.com/  American Heart Association: www.heart.org Summary  The DASH eating plan is a healthy eating plan that has been shown to reduce high blood pressure (hypertension). It may also reduce your risk for type 2 diabetes, heart disease, and stroke.  With the DASH eating plan, you should limit salt (sodium) intake to 2,300 mg a day. If you have hypertension, you may need to reduce your sodium intake to 1,500 mg a day.  When on the  DASH eating plan, aim to eat more fresh fruits and vegetables, whole grains, lean proteins, low-fat dairy, and heart-healthy fats.  Work with your health care provider or diet and nutrition specialist (dietitian) to adjust your eating plan to your individual calorie needs. This information is not intended to replace advice given to you by your health care provider. Make sure you discuss any questions you have with your health care  provider. Document Released: 05/27/2011 Document Revised: 05/31/2016 Document Reviewed: 05/31/2016 Elsevier Interactive Patient Education  [employer].

## 2018-03-07 ENCOUNTER — Ambulatory Visit: Payer: Medicaid Other | Admitting: Nurse Practitioner

## 2018-03-13 ENCOUNTER — Ambulatory Visit (INDEPENDENT_AMBULATORY_CARE_PROVIDER_SITE_OTHER): Payer: Medicare Other | Admitting: Nurse Practitioner

## 2018-03-13 ENCOUNTER — Encounter: Payer: Self-pay | Admitting: Nurse Practitioner

## 2018-03-13 VITALS — BP 138/90 | HR 79 | Temp 98.2°F | Ht 62.0 in | Wt 223.0 lb

## 2018-03-13 DIAGNOSIS — J4521 Mild intermittent asthma with (acute) exacerbation: Secondary | ICD-10-CM

## 2018-03-13 DIAGNOSIS — J302 Other seasonal allergic rhinitis: Secondary | ICD-10-CM | POA: Diagnosis not present

## 2018-03-13 DIAGNOSIS — E7800 Pure hypercholesterolemia, unspecified: Secondary | ICD-10-CM

## 2018-03-13 DIAGNOSIS — I10 Essential (primary) hypertension: Secondary | ICD-10-CM

## 2018-03-13 LAB — LIPID PANEL
CHOLESTEROL: 182 mg/dL (ref 0–200)
HDL: 44.1 mg/dL (ref >39.00)
LDL Cholesterol: 111 mg/dL — ABNORMAL HIGH (ref 0–99)
NonHDL: 138.06
Total CHOL/HDL Ratio: 4
Triglycerides: 133 mg/dL (ref 0.0–149.0)
VLDL: 26.6 mg/dL (ref 0.0–40.0)

## 2018-03-13 LAB — BASIC METABOLIC PANEL
BUN: 14 mg/dL (ref 6–23)
CALCIUM: 9.3 mg/dL (ref 8.4–10.5)
CO2: 29 meq/L (ref 19–32)
Chloride: 101 mEq/L (ref 96–112)
Creatinine, Ser: 0.62 mg/dL (ref 0.40–1.20)
GFR: 122.09 mL/min (ref >60.00)
GLUCOSE: 98 mg/dL (ref 70–99)
Potassium: 4.1 mEq/L (ref 3.5–5.1)
Sodium: 139 mEq/L (ref 135–145)

## 2018-03-13 LAB — TSH: TSH: 1.27 u[IU]/mL (ref 0.35–4.50)

## 2018-03-13 MED ORDER — LEVOCETIRIZINE DIHYDROCHLORIDE 5 MG PO TABS: 5.0000 mg | ORAL_TABLET | Freq: Every evening | ORAL | 11 refills | Status: DC

## 2018-03-13 MED ORDER — MONTELUKAST SODIUM 10 MG PO TABS: 10.0000 mg | ORAL_TABLET | Freq: Every day | ORAL | 5 refills | Status: DC

## 2018-03-13 NOTE — Patient Instructions (Addendum)
Improved lipid panel, but 16yrs cardiovascular disease risk is still at 15.89%. I will recommend use of statin. Let me know if she is ok with this. Stable thyroid function and renal function.   Continue water aerobics and chair exercise.

## 2018-03-13 NOTE — Progress Notes (Addendum)
Subjective:  Patient ID: Penny Gibson, female    DOB: [birth year]  Age: 72 y.o. MRN: [id number]  CC: Follow-up (HTN, asthma (fasting) 62mins appt. fasting/zyrtec consult. )  HPI  HTN: Controlled with lisinopril and HCTZ. Denies any adverse effects. BP Readings from Last 3 Encounters:  03/13/18 138/90  02/14/18 (!) 142/98  11/04/17 136/86  maintain DASH diet Exercise: water aerobics 2x/week and chair exercise daily.  Elevated LDL: No medication at this time. ASCVD 43yrs risk: 15.68% Consider use of statin? Repeated lipid panel today.  Allergic rhinitis: No improvement with azelastine and zrytec.  Reviewed past Medical, Social and Family history today.  Outpatient Medications Prior to Visit  Medication Sig Dispense Refill  . albuterol (PROAIR HFA) 108 (90 Base) MCG/ACT inhaler Inhale 2 puffs into the lungs every 6 (six) hours as needed for wheezing or shortness of breath. 1 Inhaler 5  . aspirin 81 MG tablet Take 81 mg by mouth daily.    . budesonide-formoterol (SYMBICORT) 160-4.5 MCG/ACT inhaler Inhale 1 puff into the lungs 2 (two) times daily. Rinse mouth after use 1 Inhaler 5  . Ferrous Sulfate (SLOW FE) 142 (45 Fe) MG TBCR Take 1 tablet by mouth daily. 30 tablet   . ibuprofen (ADVIL,MOTRIN) 200 MG tablet Take 1 tablet (200 mg total) by mouth every 8 (eight) hours as needed. With food 30 tablet 0  . cetirizine (ZYRTEC) 10 MG tablet Take 1 tablet (10 mg total) by mouth daily. 90 tablet 1  . hydrochlorothiazide (MICROZIDE) 12.5 MG capsule Take 1 capsule (12.5 mg total) by mouth daily. 90 capsule 1  . lisinopril (PRINIVIL,ZESTRIL) 40 MG tablet Take 1 tablet (40 mg total) by mouth daily. 90 tablet 1  . azithromycin (ZITHROMAX Z-PAK) 250 MG tablet Take 1 tablet (250 mg total) by mouth daily. Take 2tabs on first day, then 1tab once a day till complete (Patient not taking: Reported on 03/13/2018) 6 tablet 0  . guaiFENesin (MUCINEX) 600 MG 12 hr tablet Take 1 tablet (600  mg total) by mouth 2 (two) times daily as needed for cough or to loosen phlegm. (Patient not taking: Reported on 03/13/2018) 14 tablet 0   No facility-administered medications prior to visit.     ROS See HPI  Objective:  BP 138/90   Pulse 79   Temp 98.2 F (36.8 C) (Oral)   Ht 5\' 2"  (1.575 m)   Wt 223 lb (101.2 kg)   SpO2 96%   BMI 40.79 kg/m   BP Readings from Last 3 Encounters:  03/13/18 138/90  02/14/18 (!) 142/98  11/04/17 136/86    Wt Readings from Last 3 Encounters:  03/13/18 223 lb (101.2 kg)  02/14/18 225 lb (102.1 kg)  11/04/17 220 lb (99.8 kg)    Physical Exam  Constitutional: She appears well-developed and well-nourished.  HENT:  Right Ear: Tympanic membrane is not injected and not erythematous.  Left Ear: Tympanic membrane is not injected and not erythematous. A middle ear effusion is present.  Nose: Mucosal edema present. No rhinorrhea.  Pulmonary/Chest: Effort normal and breath sounds normal.  Skin: No erythema.  Vitals reviewed.  Lab Results  Component Value Date   WBC 20.9 (H) 10/13/2017   HGB 10.9 (L) 10/13/2017   HCT 35.0 (L) 10/13/2017   PLT 303 10/13/2017   GLUCOSE 98 03/13/2018   CHOL 182 03/13/2018   TRIG 133.0 03/13/2018   HDL 44.10 03/13/2018   LDLCALC 111 (H) 03/13/2018   ALT 13 10/11/2017   AST 16 10/11/2017  NA 139 03/13/2018   K 4.1 03/13/2018   CL 101 03/13/2018   CREATININE 0.62 03/13/2018   BUN 14 03/13/2018   CO2 29 03/13/2018   TSH 1.27 03/13/2018   HGBA1C 5.7 (H) 08/08/2017    Assessment & Plan:   Isola was seen today for follow-up.  Diagnoses and all orders for this visit:  Elevated LDL cholesterol level -     TSH -     Lipid panel -     atorvastatin (LIPITOR) 10 MG tablet; Take 1 tablet (10 mg total) by mouth daily at 6 PM.  Essential hypertension -     Basic metabolic panel -     hydrochlorothiazide (MICROZIDE) 12.5 MG capsule; Take 1 capsule (12.5 mg total) by mouth daily. -     lisinopril  (PRINIVIL,ZESTRIL) 40 MG tablet; Take 1 tablet (40 mg total) by mouth daily.  Other seasonal allergic rhinitis -     levocetirizine (XYZAL) 5 MG tablet; Take 1 tablet (5 mg total) by mouth every evening. -     montelukast (SINGULAIR) 10 MG tablet; Take 1 tablet (10 mg total) by mouth at bedtime.  Mild intermittent asthma with acute exacerbation -     montelukast (SINGULAIR) 10 MG tablet; Take 1 tablet (10 mg total) by mouth at bedtime.   I have discontinued [name] C. Maready's cetirizine, azithromycin, and guaiFENesin. I am also having her start on levocetirizine, montelukast, and atorvastatin. Additionally, I am having her maintain her aspirin, ibuprofen, budesonide-formoterol, albuterol, Ferrous Sulfate, hydrochlorothiazide, and lisinopril.  Meds ordered this encounter  Medications  . levocetirizine (XYZAL) 5 MG tablet    Sig: Take 1 tablet (5 mg total) by mouth every evening.    Dispense:  30 tablet    Refill:  11    Order Specific Question:   Supervising Provider    Answer:   Lucille Passy [[id number]]  . montelukast (SINGULAIR) 10 MG tablet    Sig: Take 1 tablet (10 mg total) by mouth at bedtime.    Dispense:  30 tablet    Refill:  5    Order Specific Question:   Supervising Provider    Answer:   Lucille Passy [[id number]]  . hydrochlorothiazide (MICROZIDE) 12.5 MG capsule    Sig: Take 1 capsule (12.5 mg total) by mouth daily.    Dispense:  90 capsule    Refill:  3    Order Specific Question:   Supervising Provider    Answer:   Lucille Passy [[id number]]  . lisinopril (PRINIVIL,ZESTRIL) 40 MG tablet    Sig: Take 1 tablet (40 mg total) by mouth daily.    Dispense:  90 tablet    Refill:  3    Order Specific Question:   Supervising Provider    Answer:   Lucille Passy [[id number]]  . atorvastatin (LIPITOR) 10 MG tablet    Sig: Take 1 tablet (10 mg total) by mouth daily at 6 PM.    Dispense:  90 tablet    Refill:  1    Order Specific Question:   Supervising Provider    Answer:   Lucille Passy  [[id number]]    Follow-up: Return in about 6 months (around 09/11/2018) for HTN and hyperlipidemia.  [name], NP

## 2018-03-14 ENCOUNTER — Encounter: Payer: Self-pay | Admitting: Nurse Practitioner

## 2018-03-14 MED ORDER — ATORVASTATIN CALCIUM 10 MG PO TABS: 10.0000 mg | ORAL_TABLET | Freq: Every day | ORAL | 1 refills | Status: DC

## 2018-03-14 MED ORDER — HYDROCHLOROTHIAZIDE 12.5 MG PO CAPS: 12.5000 mg | ORAL_CAPSULE | Freq: Every day | ORAL | 3 refills | Status: DC

## 2018-03-14 MED ORDER — LISINOPRIL 40 MG PO TABS: 40.0000 mg | ORAL_TABLET | Freq: Every day | ORAL | 3 refills | Status: DC

## 2018-03-14 NOTE — Addendum Note (Signed)
Addended by: [name] L on: 03/14/2018 02:52 PM   Modules accepted: Orders

## 2018-03-24 ENCOUNTER — Telehealth: Payer: Self-pay | Admitting: Nurse Practitioner

## 2018-03-24 NOTE — Telephone Encounter (Signed)
Maintain montelukast. Stop xyzal Ok to use claritin

## 2018-03-24 NOTE — Addendum Note (Signed)
Addended byShawnie Pons on: 03/24/2018 03:44 PM   Modules accepted: Orders

## 2018-03-24 NOTE — Telephone Encounter (Signed)
Pt is aware.    Copied from [place] [id number]. Topic: General - Other >> Mar 24, 2018  2:04 PM Yvette Rack wrote: Reason for CRM: pt states that the levocetirizine (XYZAL) 5 MG tablet    and  montelukast (SINGULAIR) 10 MG tablet Has been making her dizzy and that she would like Claritin for her sinus

## 2018-06-27 ENCOUNTER — Encounter: Payer: Self-pay | Admitting: Nurse Practitioner

## 2018-06-27 ENCOUNTER — Ambulatory Visit (INDEPENDENT_AMBULATORY_CARE_PROVIDER_SITE_OTHER): Payer: Medicare Other

## 2018-06-27 ENCOUNTER — Other Ambulatory Visit: Payer: Self-pay | Admitting: Nurse Practitioner

## 2018-06-27 ENCOUNTER — Ambulatory Visit (INDEPENDENT_AMBULATORY_CARE_PROVIDER_SITE_OTHER): Payer: Medicare Other | Admitting: Nurse Practitioner

## 2018-06-27 ENCOUNTER — Ambulatory Visit (HOSPITAL_COMMUNITY)
Admission: RE | Admit: 2018-06-27 | Discharge: 2018-06-27 | Disposition: A | Discharge status: Home / Self Care | Payer: Medicare Other | Source: Ambulatory Visit | Attending: Nurse Practitioner | Admitting: Nurse Practitioner

## 2018-06-27 VITALS — BP 180/110 | HR 78 | Temp 98.3°F | Ht 62.0 in | Wt 226.2 lb

## 2018-06-27 DIAGNOSIS — R600 Localized edema: Secondary | ICD-10-CM | POA: Diagnosis not present

## 2018-06-27 DIAGNOSIS — R0609 Other forms of dyspnea: Secondary | ICD-10-CM | POA: Diagnosis not present

## 2018-06-27 DIAGNOSIS — R9389 Abnormal findings on diagnostic imaging of other specified body structures: Secondary | ICD-10-CM

## 2018-06-27 DIAGNOSIS — I517 Cardiomegaly: Secondary | ICD-10-CM

## 2018-06-27 LAB — POCT I-STAT CREATININE: Creatinine, Ser: 0.9 mg/dL (ref 0.44–1.00)

## 2018-06-27 LAB — D-DIMER, QUANTITATIVE: D-Dimer, Quant: 1.01 mcg/mL FEU — ABNORMAL HIGH (ref <0.50)

## 2018-06-27 MED ORDER — POTASSIUM CHLORIDE CRYS ER 20 MEQ PO TBCR: 20.0000 meq | EXTENDED_RELEASE_TABLET | Freq: Every day | ORAL | 0 refills | Status: DC

## 2018-06-27 MED ORDER — IOPAMIDOL (ISOVUE-370) INJECTION 76%
INTRAVENOUS | Status: AC
  Filled 2018-06-27: qty 100

## 2018-06-27 MED ORDER — FUROSEMIDE 20 MG PO TABS: 40.0000 mg | ORAL_TABLET | Freq: Every day | ORAL | 0 refills | Status: DC

## 2018-06-27 MED ORDER — IOPAMIDOL (ISOVUE-370) INJECTION 76%
100.0000 mL | Freq: Once | INTRAVENOUS | Status: AC | PRN
Start: 2018-06-27 — End: 2018-06-27
  Administered 2018-06-27: 75 mL via INTRAVENOUS

## 2018-06-27 NOTE — Patient Instructions (Addendum)
CXR indicates cardiomegaly with persistent pulmonary edema that has not changed Need to start furosemide and potassium as prescribed. Need to return to office on Friday for re eval of lung sounds and BP (67mins slot). Patient  Notified about need for CT chest to rule out PE. Unable to obtain D-dimer and BNP in lab (multiple unsuccessful sticks).  Echocardiogram ordered due to cardiomegaly and pulmonary vascular congestion noted on CXR.  Go to hospital if symptoms worsen.

## 2018-06-27 NOTE — Progress Notes (Signed)
Subjective:  Patient ID: Penny Gibson, female    DOB: 1945/09/21  Age: 73 y.o. MRN: [id number]  CC: Follow-up (SOB went to [place] high altitude?/ left knee still bothering her, congestion-- coughing yellow clear mucus, headache, 7 days/ using her inhaler,do not have symbicort)  Shortness of Breath  This is a new problem. The current episode started 1 to 4 weeks ago. The problem occurs constantly. The problem has been unchanged. Associated symptoms include leg swelling, orthopnea and PND. Pertinent negatives include no chest pain, fever, leg pain or wheezing. The symptoms are aggravated by any activity, lying flat and URIs. Risk factors include prolonged immobilization (traveled to [place] by plane, returned yesterday). She has tried nothing for the symptoms. Her past medical history is significant for asthma and a heart failure.  traveled to [place] x 3weeks, returned yesterday.  HTN: BP is elevated today. Reports she has not been taking HCTZ. Has also skipped a few doses of lisinopril.  Reviewed past Medical, Social and Family history today.  Outpatient Medications Prior to Visit  Medication Sig Dispense Refill  . albuterol (PROAIR HFA) 108 (90 Base) MCG/ACT inhaler Inhale 2 puffs into the lungs every 6 (six) hours as needed for wheezing or shortness of breath. 1 Inhaler 5  . aspirin 81 MG tablet Take 81 mg by mouth daily.    Marland Kitchen atorvastatin (LIPITOR) 10 MG tablet Take 1 tablet (10 mg total) by mouth daily at 6 PM. 90 tablet 1  . budesonide-formoterol (SYMBICORT) 160-4.5 MCG/ACT inhaler Inhale 1 puff into the lungs 2 (two) times daily. Rinse mouth after use 1 Inhaler 5  . Ferrous Sulfate (SLOW FE) 142 (45 Fe) MG TBCR Take 1 tablet by mouth daily. 30 tablet   . hydrochlorothiazide (MICROZIDE) 12.5 MG capsule Take 1 capsule (12.5 mg total) by mouth daily. 90 capsule 3  . ibuprofen (ADVIL,MOTRIN) 200 MG tablet Take 1 tablet (200 mg total) by mouth every 8 (eight) hours as  needed. With food 30 tablet 0  . lisinopril (PRINIVIL,ZESTRIL) 40 MG tablet Take 1 tablet (40 mg total) by mouth daily. 90 tablet 3  . montelukast (SINGULAIR) 10 MG tablet Take 1 tablet (10 mg total) by mouth at bedtime. 30 tablet 5   No facility-administered medications prior to visit.     ROS See HPI  Objective:  BP (!) 180/110   Pulse 78   Temp 98.3 F (36.8 C) (Oral)   Ht 5\' 2"  (1.575 m)   Wt 226 lb 3.2 oz (102.6 kg)   SpO2 93%   BMI 41.37 kg/m   BP Readings from Last 3 Encounters:  06/27/18 (!) 180/110  03/13/18 138/90  02/14/18 (!) 142/98   Wt Readings from Last 3 Encounters:  06/27/18 226 lb 3.2 oz (102.6 kg)  03/13/18 223 lb (101.2 kg)  02/14/18 225 lb (102.1 kg)   Physical Exam Vitals signs reviewed.  Cardiovascular:     Rate and Rhythm: Normal rate.  Pulmonary:     Breath sounds: No rhonchi.     Comments: Diminished lung sounds in bases. Musculoskeletal:     Right lower leg: Edema present.     Left lower leg: Edema present.  Neurological:     Mental Status: She is alert and oriented to person, place, and time.     Lab Results  Component Value Date   WBC 20.9 (H) 10/13/2017   HGB 10.9 (L) 10/13/2017   HCT 35.0 (L) 10/13/2017   PLT 303 10/13/2017   GLUCOSE 98 03/13/2018  CHOL 182 03/13/2018   TRIG 133.0 03/13/2018   HDL 44.10 03/13/2018   LDLCALC 111 (H) 03/13/2018   ALT 13 10/11/2017   AST 16 10/11/2017   NA 139 03/13/2018   K 4.1 03/13/2018   CL 101 03/13/2018   CREATININE 0.62 03/13/2018   BUN 14 03/13/2018   CO2 29 03/13/2018   TSH 1.27 03/13/2018   HGBA1C 5.7 (H) 08/08/2017    Assessment & Plan:  CXR indicates cardiomegaly with persistent pulmonary edema that has not changed Need to start furosemide and potassium as prescribed. Need to return to office on Friday for re eval of lung sounds and BP (74mins slot). Patient  Notified about need for CT chest to rule out PE. Unable to obtain D-dimer and BNP in lab (multiple unsuccessful  sticks).  Echocardiogram ordered due to cardiomegaly and pulmonary vascular congestion noted on CXR.  Penny Gibson was seen today for follow-up.  Diagnoses and all orders for this visit:  Dyspnea on exertion -     Cancel: B Nat Peptide -     D-Dimer, Quantitative -     DG Chest 2 View -     CT Angio Chest W/Cm &/Or Wo Cm; Future -     Creatinine; Future -     BUN; Future -     ECHOCARDIOGRAM COMPLETE; Future  Bilateral leg edema -     Cancel: B Nat Peptide -     D-Dimer, Quantitative -     furosemide (LASIX) 20 MG tablet; Take 2 tablets (40 mg total) by mouth daily. -     potassium chloride SA (K-DUR,KLOR-CON) 20 MEQ tablet; Take 1 tablet (20 mEq total) by mouth daily. -     CT Angio Chest W/Cm &/Or Wo Cm; Future -     Creatinine; Future -     BUN; Future  Abnormal CXR -     CT Angio Chest W/Cm &/Or Wo Cm; Future -     Creatinine; Future -     BUN; Future  Cardiomegaly -     ECHOCARDIOGRAM COMPLETE; Future   I am having Penny Gibson start on furosemide and potassium chloride SA. I am also having her maintain her aspirin, ibuprofen, budesonide-formoterol, albuterol, Ferrous Sulfate, montelukast, hydrochlorothiazide, lisinopril, and atorvastatin.  Meds ordered this encounter  Medications  . furosemide (LASIX) 20 MG tablet    Sig: Take 2 tablets (40 mg total) by mouth daily.    Dispense:  6 tablet    Refill:  0    Order Specific Question:   Supervising Provider    Answer:   Lucille Passy [[id number]]  . potassium chloride SA (K-DUR,KLOR-CON) 20 MEQ tablet    Sig: Take 1 tablet (20 mEq total) by mouth daily.    Dispense:  3 tablet    Refill:  0    Order Specific Question:   Supervising Provider    Answer:   Lucille Passy [[id number]]    Problem List Items Addressed This Visit      Cardiovascular and Mediastinum   Cardiomegaly   Relevant Medications   furosemide (LASIX) 20 MG tablet   Other Relevant Orders   ECHOCARDIOGRAM COMPLETE    Other Visit Diagnoses    Dyspnea on  exertion    -  Primary   Relevant Orders   D-Dimer, Quantitative   DG Chest 2 View (Completed)   CT Angio Chest W/Cm &/Or Wo Cm   Creatinine   BUN   ECHOCARDIOGRAM COMPLETE   Bilateral  leg edema       Relevant Medications   furosemide (LASIX) 20 MG tablet   potassium chloride SA (K-DUR,KLOR-CON) 20 MEQ tablet   Other Relevant Orders   D-Dimer, Quantitative   CT Angio Chest W/Cm &/Or Wo Cm   Creatinine   BUN   Abnormal CXR       Relevant Orders   CT Angio Chest W/Cm &/Or Wo Cm   Creatinine   BUN       Follow-up: Return in about 3 days (around 06/30/2018) for SOB and HTN (47mins, needs repeat BMP).  [name], NP

## 2018-06-27 NOTE — Progress Notes (Signed)
Attempted to call report to [facility], no response, left voicemail to call CT department @[phone] 

## 2018-06-28 ENCOUNTER — Encounter: Payer: Self-pay | Admitting: Nurse Practitioner

## 2018-06-30 ENCOUNTER — Ambulatory Visit (INDEPENDENT_AMBULATORY_CARE_PROVIDER_SITE_OTHER): Payer: Medicare Other | Admitting: Nurse Practitioner

## 2018-06-30 ENCOUNTER — Encounter: Payer: Self-pay | Admitting: Nurse Practitioner

## 2018-06-30 ENCOUNTER — Other Ambulatory Visit: Payer: Self-pay | Admitting: Nurse Practitioner

## 2018-06-30 VITALS — BP 160/102 | HR 83 | Temp 97.8°F | Ht 62.0 in | Wt 217.2 lb

## 2018-06-30 DIAGNOSIS — Z1231 Encounter for screening mammogram for malignant neoplasm of breast: Secondary | ICD-10-CM

## 2018-06-30 DIAGNOSIS — D500 Iron deficiency anemia secondary to blood loss (chronic): Secondary | ICD-10-CM | POA: Diagnosis not present

## 2018-06-30 DIAGNOSIS — J0140 Acute pansinusitis, unspecified: Secondary | ICD-10-CM | POA: Diagnosis not present

## 2018-06-30 DIAGNOSIS — R400 Somnolence: Secondary | ICD-10-CM

## 2018-06-30 DIAGNOSIS — R600 Localized edema: Secondary | ICD-10-CM

## 2018-06-30 DIAGNOSIS — R0683 Snoring: Secondary | ICD-10-CM

## 2018-06-30 DIAGNOSIS — J4520 Mild intermittent asthma, uncomplicated: Secondary | ICD-10-CM

## 2018-06-30 DIAGNOSIS — I10 Essential (primary) hypertension: Secondary | ICD-10-CM

## 2018-06-30 LAB — BRAIN NATRIURETIC PEPTIDE: Pro B Natriuretic peptide (BNP): 40 pg/mL (ref 0.0–100.0)

## 2018-06-30 LAB — CBC WITH DIFFERENTIAL/PLATELET
Basophils Absolute: 0 10*3/uL (ref 0.0–0.1)
Basophils Relative: 0.4 % (ref 0.0–3.0)
EOS ABS: 0.2 10*3/uL (ref 0.0–0.7)
Eosinophils Relative: 1.8 % (ref 0.0–5.0)
HCT: 40.5 % (ref 36.0–46.0)
Hemoglobin: 13 g/dL (ref 12.0–15.0)
Lymphocytes Relative: 17.7 % (ref 12.0–46.0)
Lymphs Abs: 1.8 10*3/uL (ref 0.7–4.0)
MCHC: 32.2 g/dL (ref 30.0–36.0)
MCV: 85.3 fl (ref 78.0–100.0)
Monocytes Absolute: 0.7 10*3/uL (ref 0.1–1.0)
Monocytes Relative: 7.1 % (ref 3.0–12.0)
Neutro Abs: 7.6 10*3/uL (ref 1.4–7.7)
Neutrophils Relative %: 73 % (ref 43.0–77.0)
Platelets: 316 10*3/uL (ref 150.0–400.0)
RBC: 4.75 Mil/uL (ref 3.87–5.11)
RDW: 16.9 % — ABNORMAL HIGH (ref 11.5–15.5)
WBC: 10.4 10*3/uL (ref 4.0–10.5)

## 2018-06-30 LAB — BASIC METABOLIC PANEL
BUN: 13 mg/dL (ref 6–23)
CALCIUM: 9.7 mg/dL (ref 8.4–10.5)
CO2: 32 mEq/L (ref 19–32)
Chloride: 95 mEq/L — ABNORMAL LOW (ref 96–112)
Creatinine, Ser: 0.71 mg/dL (ref 0.40–1.20)
GFR: 104.32 mL/min (ref >60.00)
Glucose, Bld: 99 mg/dL (ref 70–99)
Potassium: 3.7 mEq/L (ref 3.5–5.1)
Sodium: 139 mEq/L (ref 135–145)

## 2018-06-30 MED ORDER — BUDESONIDE-FORMOTEROL FUMARATE 160-4.5 MCG/ACT IN AERO: 1.0000 | INHALATION_SPRAY | Freq: Two times a day (BID) | RESPIRATORY_TRACT | 5 refills | Status: DC

## 2018-06-30 MED ORDER — FLUTICASONE PROPIONATE 50 MCG/ACT NA SUSP: 2.0000 | Freq: Every day | NASAL | 0 refills | Status: DC

## 2018-06-30 MED ORDER — AZITHROMYCIN 250 MG PO TABS: 250.0000 mg | ORAL_TABLET | Freq: Every day | ORAL | 0 refills | Status: DC

## 2018-06-30 MED ORDER — AMLODIPINE BESYLATE 5 MG PO TABS: 5.0000 mg | ORAL_TABLET | Freq: Every day | ORAL | 3 refills | Status: DC

## 2018-06-30 NOTE — Progress Notes (Signed)
Subjective:  Patient ID: Penny Gibson, female    DOB: 06-15-1946  Age: 73 y.o. MRN: [id number]  CC: Follow-up (f/u lung sound and BP-- still feels like have sinus problems/ ankles and legs swollen/ hasn't checked BP--but been taken med)  Sinusitis  This is a new problem. The current episode started 1 to 4 weeks ago. The problem has been gradually worsening since onset. Associated symptoms include congestion, coughing, headaches, sinus pressure and a sore throat. Pertinent negatives include no chills. Past treatments include nothing.   Dyspnea: Improved with furosemide x 3days. Lost 10Lbs. Unable to find symbicort inhaler. Has not used inhaler in 41month  HTN: Still elevated. Use of lisinopril and HCTZ as prescribed Report loud snoring and witnessed apnea by daughter. No previous sleep study. Cardiomegaly and pulmonary vascular congestion noted on CXR. No previous echocardiogram BP Readings from Last 3 Encounters:  06/30/18 (!) 160/102  06/27/18 (!) 180/110  03/13/18 138/90   Reviewed past Medical, Social and Family history today.  Outpatient Medications Prior to Visit  Medication Sig Dispense Refill  . albuterol (PROAIR HFA) 108 (90 Base) MCG/ACT inhaler Inhale 2 puffs into the lungs every 6 (six) hours as needed for wheezing or shortness of breath. 1 Inhaler 5  . aspirin 81 MG tablet Take 81 mg by mouth daily.    Marland Kitchen atorvastatin (LIPITOR) 10 MG tablet Take 1 tablet (10 mg total) by mouth daily at 6 PM. 90 tablet 1  . Ferrous Sulfate (SLOW FE) 142 (45 Fe) MG TBCR Take 1 tablet by mouth daily. 30 tablet   . hydrochlorothiazide (MICROZIDE) 12.5 MG capsule Take 1 capsule (12.5 mg total) by mouth daily. 90 capsule 3  . ibuprofen (ADVIL,MOTRIN) 200 MG tablet Take 1 tablet (200 mg total) by mouth every 8 (eight) hours as needed. With food 30 tablet 0  . lisinopril (PRINIVIL,ZESTRIL) 40 MG tablet Take 1 tablet (40 mg total) by mouth daily. 90 tablet 3  . montelukast  (SINGULAIR) 10 MG tablet Take 1 tablet (10 mg total) by mouth at bedtime. 30 tablet 5  . budesonide-formoterol (SYMBICORT) 160-4.5 MCG/ACT inhaler Inhale 1 puff into the lungs 2 (two) times daily. Rinse mouth after use 1 Inhaler 5  . furosemide (LASIX) 20 MG tablet Take 2 tablets (40 mg total) by mouth daily. 6 tablet 0  . potassium chloride SA (K-DUR,KLOR-CON) 20 MEQ tablet Take 1 tablet (20 mEq total) by mouth daily. 3 tablet 0   No facility-administered medications prior to visit.     ROS See HPI  Objective:  BP (!) 160/102   Pulse 83   Temp 97.8 F (36.6 C) (Oral)   Ht 5\' 2"  (1.575 m)   Wt 217 lb 3.2 oz (98.5 kg)   SpO2 92%   BMI 39.73 kg/m   BP Readings from Last 3 Encounters:  06/30/18 (!) 160/102  06/27/18 (!) 180/110  03/13/18 138/90    Wt Readings from Last 3 Encounters:  06/30/18 217 lb 3.2 oz (98.5 kg)  06/27/18 226 lb 3.2 oz (102.6 kg)  03/13/18 223 lb (101.2 kg)    Physical Exam Constitutional:      Appearance: Penny Gibson is obese.  HENT:     Nose: Mucosal edema and rhinorrhea present.     Right Sinus: Maxillary sinus tenderness and frontal sinus tenderness present.     Left Sinus: Maxillary sinus tenderness and frontal sinus tenderness present.     Mouth/Throat:     Dentition: Has dentures.     Pharynx: Posterior  oropharyngeal erythema present. No oropharyngeal exudate.     Tonsils: Swelling: 1+ on the right. 1+ on the left.  Cardiovascular:     Rate and Rhythm: Normal rate and regular rhythm.  Pulmonary:     Effort: Pulmonary effort is normal.     Breath sounds: Normal breath sounds.  Musculoskeletal:     Right lower leg: Edema present.     Left lower leg: Edema present.  Neurological:     Mental Status: Penny Gibson is alert and oriented to person, place, and time.     Lab Results  Component Value Date   WBC 20.9 (H) 10/13/2017   HGB 10.9 (L) 10/13/2017   HCT 35.0 (L) 10/13/2017   PLT 303 10/13/2017   GLUCOSE 98 03/13/2018   CHOL 182 03/13/2018   TRIG  133.0 03/13/2018   HDL 44.10 03/13/2018   LDLCALC 111 (H) 03/13/2018   ALT 13 10/11/2017   AST 16 10/11/2017   NA 139 03/13/2018   K 4.1 03/13/2018   CL 101 03/13/2018   CREATININE 0.90 06/27/2018   BUN 14 03/13/2018   CO2 29 03/13/2018   TSH 1.27 03/13/2018   HGBA1C 5.7 (H) 08/08/2017    Dg Chest 2 View  Result Date: 06/27/2018 CLINICAL DATA:  Dyspnea on exertion. EXAM: CHEST - 2 VIEW COMPARISON:  Radiographs of May 27, 2017. FINDINGS: Stable cardiomegaly. Stable central pulmonary vascular congestion is noted. No pneumothorax or pleural effusion is noted. No consolidative process is noted. Bony thorax is unremarkable. IMPRESSION: Stable cardiomegaly with central pulmonary vascular congestion. Electronically Signed   By: Marijo Conception, M.D.   On: 06/27/2018 10:43   Ct Angio Chest W/cm &/or Wo Cm  Result Date: 06/27/2018 CLINICAL DATA:  Shortness of breath and bilateral lower extremity swelling for 2 weeks. Suspected pulmonary embolism. EXAM: CT ANGIOGRAPHY CHEST WITH CONTRAST TECHNIQUE: Multidetector CT imaging of the chest was performed using the standard protocol during bolus administration of intravenous contrast. Multiplanar CT image reconstructions and MIPs were obtained to evaluate the vascular anatomy. CONTRAST:  33mL ISOVUE-370 IOPAMIDOL (ISOVUE-370) INJECTION 76% COMPARISON:  None FINDINGS: Cardiovascular: Satisfactory opacification of pulmonary arteries noted, and no pulmonary emboli identified. No evidence of thoracic aortic dissection or aneurysm. Mild cardiomegaly. Mediastinum/Nodes: No masses or pathologically enlarged lymph nodes identified. Lungs/Pleura: No pulmonary mass, infiltrate, or effusion. Upper abdomen: No acute findings. Musculoskeletal: No suspicious bone lesions identified. Old fracture deformity of right clavicle noted. Review of the MIP images confirms the above findings. IMPRESSION: No evidence of pulmonary embolism or other acute findings. Mild cardiomegaly.  Electronically Signed   By: Earle Gell M.D.   On: 06/27/2018 18:12    Assessment & Plan:   Penny Gibson was seen today for follow-up.  Diagnoses and all orders for this visit:  Essential hypertension -     Ambulatory referral to Neurology -     amLODipine (NORVASC) 5 MG tablet; Take 1 tablet (5 mg total) by mouth daily. -     Basic metabolic panel  Iron deficiency anemia due to chronic blood loss -     CBC w/Diff  Acute non-recurrent pansinusitis -     fluticasone (FLONASE) 50 MCG/ACT nasal spray; Place 2 sprays into both nostrils daily. -     azithromycin (ZITHROMAX Z-PAK) 250 MG tablet; Take 1 tablet (250 mg total) by mouth daily. Take 2tabs on first day, then 1tab once a day till complete  Daytime somnolence -     Ambulatory referral to Neurology  Snoring -  Ambulatory referral to Neurology  Bilateral leg edema -     Ambulatory referral to Neurology -     B Nat Peptide  Mild intermittent asthma without complication -     budesonide-formoterol (SYMBICORT) 160-4.5 MCG/ACT inhaler; Inhale 1 puff into the lungs 2 (two) times daily. Rinse mouth after use   I have discontinued Penny Gibson's furosemide and potassium chloride SA. I am also having her start on amLODipine, fluticasone, and azithromycin. Additionally, I am having her maintain her aspirin, ibuprofen, albuterol, Ferrous Sulfate, montelukast, hydrochlorothiazide, lisinopril, atorvastatin, and budesonide-formoterol.  Meds ordered this encounter  Medications  . amLODipine (NORVASC) 5 MG tablet    Sig: Take 1 tablet (5 mg total) by mouth daily.    Dispense:  90 tablet    Refill:  3    Order Specific Question:   Supervising Provider    Answer:   MATTHEWS, CODY [[id number]]  . fluticasone (FLONASE) 50 MCG/ACT nasal spray    Sig: Place 2 sprays into both nostrils daily.    Dispense:  16 g    Refill:  0    Order Specific Question:   Supervising Provider    Answer:   MATTHEWS, CODY [[id number]]  . azithromycin (ZITHROMAX  Z-PAK) 250 MG tablet    Sig: Take 1 tablet (250 mg total) by mouth daily. Take 2tabs on first day, then 1tab once a day till complete    Dispense:  6 tablet    Refill:  0    Order Specific Question:   Supervising Provider    Answer:   MATTHEWS, CODY [[id number]]  . budesonide-formoterol (SYMBICORT) 160-4.5 MCG/ACT inhaler    Sig: Inhale 1 puff into the lungs 2 (two) times daily. Rinse mouth after use    Dispense:  1 Inhaler    Refill:  5    Order Specific Question:   Supervising Provider    Answer:   MATTHEWS, CODY [[id number]]    Problem List Items Addressed This Visit      Cardiovascular and Mediastinum   Essential hypertension - Primary (Chronic)   Relevant Medications   amLODipine (NORVASC) 5 MG tablet   Other Relevant Orders   Ambulatory referral to Neurology   Basic metabolic panel     Respiratory   Asthma, mild intermittent   Relevant Medications   budesonide-formoterol (SYMBICORT) 160-4.5 MCG/ACT inhaler     Other   Iron deficiency anemia   Relevant Orders   CBC w/Diff    Other Visit Diagnoses    Acute non-recurrent pansinusitis       Relevant Medications   fluticasone (FLONASE) 50 MCG/ACT nasal spray   azithromycin (ZITHROMAX Z-PAK) 250 MG tablet   Daytime somnolence       Relevant Orders   Ambulatory referral to Neurology   Snoring       Relevant Orders   Ambulatory referral to Neurology   Bilateral leg edema       Relevant Orders   Ambulatory referral to Neurology   B Nat Peptide      Follow-up: Return in about 1 week (around 07/07/2018) for HTN and edema.  [name], NP

## 2018-06-30 NOTE — Patient Instructions (Addendum)
You will be contacted to schedule appt for echocardiogram and appt with neurology.  Maintain DASH diet.  Continue lisinopril and HCTZ in morning. Start amlodipine at bettime  Check BP at home once a day. Bring BP readings to next OV.  Stable lab result. Proceed with medications as prescribed  DASH Eating Plan DASH stands for "Dietary Approaches to Stop Hypertension." The DASH eating plan is a healthy eating plan that has been shown to reduce high blood pressure (hypertension). It may also reduce your risk for type 2 diabetes, heart disease, and stroke. The DASH eating plan may also help with weight loss. What are tips for following this plan?  General guidelines  Avoid eating more than 2,300 mg (milligrams) of salt (sodium) a day. If you have hypertension, you may need to reduce your sodium intake to 1,500 mg a day.  Limit alcohol intake to no more than 1 drink a day for nonpregnant women and 2 drinks a day for men. One drink equals 12 oz of beer, 5 oz of wine, or 1 oz of hard liquor.  Work with your health care provider to maintain a healthy body weight or to lose weight. Ask what an ideal weight is for you.  Get at least 30 minutes of exercise that causes your heart to beat faster (aerobic exercise) most days of the week. Activities may include walking, swimming, or biking.  Work with your health care provider or diet and nutrition specialist (dietitian) to adjust your eating plan to your individual calorie needs. Reading food labels   Check food labels for the amount of sodium per serving. Choose foods with less than 5 percent of the Daily Value of sodium. Generally, foods with less than 300 mg of sodium per serving fit into this eating plan.  To find whole grains, look for the word "whole" as the first word in the ingredient list. Shopping  Buy products labeled as "low-sodium" or "no salt added."  Buy fresh foods. Avoid canned foods and premade or frozen  meals. Cooking  Avoid adding salt when cooking. Use salt-free seasonings or herbs instead of table salt or sea salt. Check with your health care provider or pharmacist before using salt substitutes.  Do not fry foods. Cook foods using healthy methods such as baking, boiling, grilling, and broiling instead.  Cook with heart-healthy oils, such as olive, canola, soybean, or sunflower oil. Meal planning  Eat a balanced diet that includes: ? 5 or more servings of fruits and vegetables each day. At each meal, try to fill half of your plate with fruits and vegetables. ? Up to 6-8 servings of whole grains each day. ? Less than 6 oz of lean meat, poultry, or fish each day. A 3-oz serving of meat is about the same size as a deck of cards. One egg equals 1 oz. ? 2 servings of low-fat dairy each day. ? A serving of nuts, seeds, or beans 5 times each week. ? Heart-healthy fats. Healthy fats called Omega-3 fatty acids are found in foods such as flaxseeds and coldwater fish, like sardines, salmon, and mackerel.  Limit how much you eat of the following: ? Canned or prepackaged foods. ? Food that is high in trans fat, such as fried foods. ? Food that is high in saturated fat, such as fatty meat. ? Sweets, desserts, sugary drinks, and other foods with added sugar. ? Full-fat dairy products.  Do not salt foods before eating.  Try to eat at least 2 vegetarian meals  each week.  Eat more home-cooked food and less restaurant, buffet, and fast food.  When eating at a restaurant, ask that your food be prepared with less salt or no salt, if possible. What foods are recommended? The items listed may not be a complete list. Talk with your dietitian about what dietary choices are best for you. Grains Whole-grain or whole-wheat bread. Whole-grain or whole-wheat pasta. Brown rice. [place]. Bulgur. Whole-grain and low-sodium cereals. Pita bread. Low-fat, low-sodium crackers. Whole-wheat flour  tortillas. Vegetables Fresh or frozen vegetables (raw, steamed, roasted, or grilled). Low-sodium or reduced-sodium tomato and vegetable juice. Low-sodium or reduced-sodium tomato sauce and tomato paste. Low-sodium or reduced-sodium canned vegetables. Fruits All fresh, dried, or frozen fruit. Canned fruit in natural juice (without added sugar). Meat and other protein foods Skinless chicken or [place]. Ground chicken or [place]. Pork with fat trimmed off. Fish and seafood. Egg whites. Dried beans, peas, or lentils. Unsalted nuts, nut butters, and seeds. Unsalted canned beans. Lean cuts of beef with fat trimmed off. Low-sodium, lean deli meat. Dairy Low-fat (1%) or fat-free (skim) milk. Fat-free, low-fat, or reduced-fat cheeses. Nonfat, low-sodium ricotta or cottage cheese. Low-fat or nonfat yogurt. Low-fat, low-sodium cheese. Fats and oils Soft margarine without trans fats. Vegetable oil. Low-fat, reduced-fat, or light mayonnaise and salad dressings (reduced-sodium). Canola, safflower, olive, soybean, and sunflower oils. Avocado. Seasoning and other foods Herbs. Spices. Seasoning mixes without salt. Unsalted popcorn and pretzels. Fat-free sweets. What foods are not recommended? The items listed may not be a complete list. Talk with your dietitian about what dietary choices are best for you. Grains Baked goods made with fat, such as croissants, muffins, or some breads. Dry pasta or rice meal packs. Vegetables Creamed or fried vegetables. Vegetables in a cheese sauce. Regular canned vegetables (not low-sodium or reduced-sodium). Regular canned tomato sauce and paste (not low-sodium or reduced-sodium). Regular tomato and vegetable juice (not low-sodium or reduced-sodium). Angie Fava. Olives. Fruits Canned fruit in a light or heavy syrup. Fried fruit. Fruit in cream or butter sauce. Meat and other protein foods Fatty cuts of meat. Ribs. Fried meat. Berniece Salines. Sausage. Bologna and other processed lunch meats.  Salami. Fatback. Hotdogs. Bratwurst. Salted nuts and seeds. Canned beans with added salt. Canned or smoked fish. Whole eggs or egg yolks. Chicken or [place] with skin. Dairy Whole or 2% milk, cream, and half-and-half. Whole or full-fat cream cheese. Whole-fat or sweetened yogurt. Full-fat cheese. Nondairy creamers. Whipped toppings. Processed cheese and cheese spreads. Fats and oils Butter. Stick margarine. Lard. Shortening. Ghee. Bacon fat. Tropical oils, such as coconut, palm kernel, or palm oil. Seasoning and other foods Salted popcorn and pretzels. Onion salt, garlic salt, seasoned salt, table salt, and sea salt. Worcestershire sauce. Tartar sauce. Barbecue sauce. Teriyaki sauce. Soy sauce, including reduced-sodium. Steak sauce. Canned and packaged gravies. Fish sauce. Oyster sauce. Cocktail sauce. Horseradish that you find on the shelf. Ketchup. Mustard. Meat flavorings and tenderizers. Bouillon cubes. Hot sauce and Tabasco sauce. Premade or packaged marinades. Premade or packaged taco seasonings. Relishes. Regular salad dressings. Where to find more information:  National Heart, Lung, and [place]: https://wilson-eaton.com/  American Heart Association: www.heart.org Summary  The DASH eating plan is a healthy eating plan that has been shown to reduce high blood pressure (hypertension). It may also reduce your risk for type 2 diabetes, heart disease, and stroke.  With the DASH eating plan, you should limit salt (sodium) intake to 2,300 mg a day. If you have hypertension, you may need to reduce  your sodium intake to 1,500 mg a day.  When on the DASH eating plan, aim to eat more fresh fruits and vegetables, whole grains, lean proteins, low-fat dairy, and heart-healthy fats.  Work with your health care provider or diet and nutrition specialist (dietitian) to adjust your eating plan to your individual calorie needs. This information is not intended to replace advice given to you by your health  care provider. Make sure you discuss any questions you have with your health care provider. Document Released: 05/27/2011 Document Revised: 05/31/2016 Document Reviewed: 05/31/2016 Elsevier Interactive Patient Education  2019 [employer].

## 2018-07-07 ENCOUNTER — Ambulatory Visit (HOSPITAL_COMMUNITY): Payer: Medicare Other | Attending: Cardiovascular Disease

## 2018-07-07 ENCOUNTER — Other Ambulatory Visit: Payer: Self-pay

## 2018-07-07 DIAGNOSIS — I517 Cardiomegaly: Secondary | ICD-10-CM | POA: Insufficient documentation

## 2018-07-07 DIAGNOSIS — R0609 Other forms of dyspnea: Secondary | ICD-10-CM | POA: Insufficient documentation

## 2018-07-14 ENCOUNTER — Ambulatory Visit (INDEPENDENT_AMBULATORY_CARE_PROVIDER_SITE_OTHER): Payer: Medicare Other | Admitting: Nurse Practitioner

## 2018-07-14 ENCOUNTER — Encounter: Payer: Self-pay | Admitting: Nurse Practitioner

## 2018-07-14 VITALS — BP 140/98 | HR 96 | Temp 98.7°F | Ht 62.0 in | Wt 214.6 lb

## 2018-07-14 DIAGNOSIS — J0140 Acute pansinusitis, unspecified: Secondary | ICD-10-CM

## 2018-07-14 DIAGNOSIS — I10 Essential (primary) hypertension: Secondary | ICD-10-CM

## 2018-07-14 MED ORDER — METHYLPREDNISOLONE 4 MG PO TBPK: ORAL_TABLET | ORAL | 0 refills | Status: DC

## 2018-07-14 MED ORDER — METHYLPREDNISOLONE ACETATE 40 MG/ML IJ SUSP
40.0000 mg | Freq: Once | INTRAMUSCULAR | Status: AC
  Administered 2018-07-14: 40 mg via INTRAMUSCULAR

## 2018-07-14 NOTE — Progress Notes (Signed)
Subjective:  Patient ID: Penny Gibson, female    DOB: [birth year]  Age: 73 y.o. MRN: [id number]  CC: Follow-up (BP re check/ cough-- non productive, headache, / 3 days/tylenol)  URI   This is a new problem. The current episode started in the past 7 days. The problem has been unchanged. There has been no fever. Associated symptoms include congestion, coughing, a plugged ear sensation, rhinorrhea, sinus pain and a sore throat. Pertinent negatives include no wheezing. She has tried nothing for the symptoms.   HTN: improved with amlodipine, HCTZ and lisinopril. BP Readings from Last 3 Encounters:  07/14/18 (!) 140/98  06/30/18 (!) 160/102  06/27/18 (!) 180/110   Reviewed past Medical, Social and Family history today.  Outpatient Medications Prior to Visit  Medication Sig Dispense Refill  . albuterol (PROAIR HFA) 108 (90 Base) MCG/ACT inhaler Inhale 2 puffs into the lungs every 6 (six) hours as needed for wheezing or shortness of breath. 1 Inhaler 5  . amLODipine (NORVASC) 5 MG tablet Take 1 tablet (5 mg total) by mouth daily. 90 tablet 3  . aspirin 81 MG tablet Take 81 mg by mouth daily.    . budesonide-formoterol (SYMBICORT) 160-4.5 MCG/ACT inhaler Inhale 1 puff into the lungs 2 (two) times daily. Rinse mouth after use 1 Inhaler 5  . Ferrous Sulfate (SLOW FE) 142 (45 Fe) MG TBCR Take 1 tablet by mouth daily. 30 tablet   . hydrochlorothiazide (MICROZIDE) 12.5 MG capsule Take 1 capsule (12.5 mg total) by mouth daily. 90 capsule 3  . ibuprofen (ADVIL,MOTRIN) 200 MG tablet Take 1 tablet (200 mg total) by mouth every 8 (eight) hours as needed. With food 30 tablet 0  . lisinopril (PRINIVIL,ZESTRIL) 40 MG tablet Take 1 tablet (40 mg total) by mouth daily. 90 tablet 3  . montelukast (SINGULAIR) 10 MG tablet Take 1 tablet (10 mg total) by mouth at bedtime. 30 tablet 5  . fluticasone (FLONASE) 50 MCG/ACT nasal spray SHAKE LIQUID AND USE 2 SPRAYS IN EACH NOSTRIL DAILY 16 g 1  .  atorvastatin (LIPITOR) 10 MG tablet Take 1 tablet (10 mg total) by mouth daily at 6 PM. (Patient not taking: Reported on 07/14/2018) 90 tablet 1  . azithromycin (ZITHROMAX Z-PAK) 250 MG tablet Take 1 tablet (250 mg total) by mouth daily. Take 2tabs on first day, then 1tab once a day till complete (Patient not taking: Reported on 07/14/2018) 6 tablet 0   No facility-administered medications prior to visit.     ROS See HPI  Objective:  BP (!) 140/98   Pulse 96   Temp 98.7 F (37.1 C) (Oral)   Ht 5\' 2"  (1.575 m)   Wt 214 lb 9.6 oz (97.3 kg)   SpO2 95%   BMI 39.25 kg/m   BP Readings from Last 3 Encounters:  07/14/18 (!) 140/98  06/30/18 (!) 160/102  06/27/18 (!) 180/110    Wt Readings from Last 3 Encounters:  07/14/18 214 lb 9.6 oz (97.3 kg)  06/30/18 217 lb 3.2 oz (98.5 kg)  06/27/18 226 lb 3.2 oz (102.6 kg)    Physical Exam Constitutional:      Appearance: She is obese.  Cardiovascular:     Rate and Rhythm: Normal rate and regular rhythm.     Pulses: Normal pulses.     Heart sounds: Normal heart sounds.  Pulmonary:     Effort: Pulmonary effort is normal.     Breath sounds: Normal breath sounds.  Musculoskeletal:     Right  lower leg: No edema.     Left lower leg: No edema.  Neurological:     Mental Status: She is alert and oriented to person, place, and time.     Lab Results  Component Value Date   WBC 10.4 06/30/2018   HGB 13.0 06/30/2018   HCT 40.5 06/30/2018   PLT 316.0 06/30/2018   GLUCOSE 99 06/30/2018   CHOL 182 03/13/2018   TRIG 133.0 03/13/2018   HDL 44.10 03/13/2018   LDLCALC 111 (H) 03/13/2018   ALT 13 10/11/2017   AST 16 10/11/2017   NA 139 06/30/2018   K 3.7 06/30/2018   CL 95 (L) 06/30/2018   CREATININE 0.71 06/30/2018   BUN 13 06/30/2018   CO2 32 06/30/2018   TSH 1.27 03/13/2018   HGBA1C 5.7 (H) 08/08/2017    Dg Chest 2 View  Result Date: 06/27/2018 CLINICAL DATA:  Dyspnea on exertion. EXAM: CHEST - 2 VIEW COMPARISON:  Radiographs of  May 27, 2017. FINDINGS: Stable cardiomegaly. Stable central pulmonary vascular congestion is noted. No pneumothorax or pleural effusion is noted. No consolidative process is noted. Bony thorax is unremarkable. IMPRESSION: Stable cardiomegaly with central pulmonary vascular congestion. Electronically Signed   By: Marijo Conception, M.D.   On: 06/27/2018 10:43   Ct Angio Chest W/cm &/or Wo Cm  Result Date: 06/27/2018 CLINICAL DATA:  Shortness of breath and bilateral lower extremity swelling for 2 weeks. Suspected pulmonary embolism. EXAM: CT ANGIOGRAPHY CHEST WITH CONTRAST TECHNIQUE: Multidetector CT imaging of the chest was performed using the standard protocol during bolus administration of intravenous contrast. Multiplanar CT image reconstructions and MIPs were obtained to evaluate the vascular anatomy. CONTRAST:  55mL ISOVUE-370 IOPAMIDOL (ISOVUE-370) INJECTION 76% COMPARISON:  None FINDINGS: Cardiovascular: Satisfactory opacification of pulmonary arteries noted, and no pulmonary emboli identified. No evidence of thoracic aortic dissection or aneurysm. Mild cardiomegaly. Mediastinum/Nodes: No masses or pathologically enlarged lymph nodes identified. Lungs/Pleura: No pulmonary mass, infiltrate, or effusion. Upper abdomen: No acute findings. Musculoskeletal: No suspicious bone lesions identified. Old fracture deformity of right clavicle noted. Review of the MIP images confirms the above findings. IMPRESSION: No evidence of pulmonary embolism or other acute findings. Mild cardiomegaly. Electronically Signed   By: Earle Gell M.D.   On: 06/27/2018 18:12    Assessment & Plan:   Penny Gibson was seen today for follow-up.  Diagnoses and all orders for this visit:  Acute non-recurrent pansinusitis -     methylPREDNISolone (MEDROL DOSEPAK) 4 MG TBPK tablet; Take as directed on package -     methylPREDNISolone acetate (DEPO-MEDROL) injection 40 mg  Essential hypertension   I have discontinued Penny Sauer C.  Gibson's azithromycin and fluticasone. I am also having her start on methylPREDNISolone. Additionally, I am having her maintain her aspirin, ibuprofen, albuterol, Ferrous Sulfate, montelukast, hydrochlorothiazide, lisinopril, atorvastatin, amLODipine, and budesonide-formoterol. We administered methylPREDNISolone acetate.  Meds ordered this encounter  Medications  . methylPREDNISolone (MEDROL DOSEPAK) 4 MG TBPK tablet    Sig: Take as directed on package    Dispense:  21 tablet    Refill:  0    Order Specific Question:   Supervising Provider    Answer:   Lucille Passy [[id number]]  . methylPREDNISolone acetate (DEPO-MEDROL) injection 40 mg    Problem List Items Addressed This Visit      Cardiovascular and Mediastinum   Essential hypertension (Chronic)    Other Visit Diagnoses    Acute non-recurrent pansinusitis    -  Primary   Relevant Medications  methylPREDNISolone (MEDROL DOSEPAK) 4 MG TBPK tablet   methylPREDNISolone acetate (DEPO-MEDROL) injection 40 mg (Completed)       Follow-up: Return in about 4 weeks (around 08/11/2018) for hyperlipidemia and HTN (fasting).  [name], NP

## 2018-07-14 NOTE — Patient Instructions (Addendum)
Start oral prednisone tomorrow (take with food).  Continue other medications.  [facility] to schedule mammogram.  Bring BP machine during next OV.  BP goal: 120/70-140/80. Call office if BP > 150/100 for more than 2days.

## 2018-07-16 ENCOUNTER — Encounter: Payer: Self-pay | Admitting: Nurse Practitioner

## 2018-08-01 ENCOUNTER — Telehealth: Payer: Self-pay

## 2018-08-01 NOTE — Telephone Encounter (Signed)
Pt stated since she started to take this med, she feel more dizzy (sometime she unable to move for a min) and it also cause hair loss. Pt was wondering what to do? Report of BP reading  08/01/2018--136/80, 130/79, 124/77 07/31/2018--110/78 07/30/2018--118/79 07/29/2018--105/76 07/28/2018--98/65, 117/73

## 2018-08-01 NOTE — Telephone Encounter (Signed)
Please advise to decrease amlodipine to 2.5mg  once a day.

## 2018-08-01 NOTE — Telephone Encounter (Signed)
Copied from [place] [id number]. Topic: General - Inquiry >> Aug 01, 2018  1:13 PM Conception Chancy, NT wrote: Reason for CRM: patient Is requesting a call back from [place].

## 2018-08-02 NOTE — Telephone Encounter (Signed)
Pt is aware.  

## 2018-08-09 ENCOUNTER — Other Ambulatory Visit: Payer: Self-pay | Admitting: Nurse Practitioner

## 2018-08-09 DIAGNOSIS — Z1231 Encounter for screening mammogram for malignant neoplasm of breast: Secondary | ICD-10-CM

## 2018-08-14 ENCOUNTER — Institutional Professional Consult (permissible substitution): Payer: Medicare Other | Admitting: Neurology

## 2018-09-12 ENCOUNTER — Telehealth: Payer: Self-pay | Admitting: Neurology

## 2018-09-12 NOTE — Telephone Encounter (Signed)
Due to current COVID 19 pandemic, our office is severely reducing in office visits for at least the next 2 weeks, in order to minimize the risk to our patients and healthcare providers. Our staff will contact you for next steps. I left a message for the pt to call me back and let me know if she wishes to push her appt out or do a telephone visit. Once pt calls back if she wishes to do a telephone visit I will go over the consent form with the pt.  ° °

## 2018-09-12 NOTE — Telephone Encounter (Signed)
Pt returned the call, at this time she does not wish to keep the appt or have tele visit. She will call back to r/s

## 2018-09-13 ENCOUNTER — Institutional Professional Consult (permissible substitution): Payer: Medicare Other | Admitting: Neurology

## 2018-09-14 ENCOUNTER — Ambulatory Visit: Payer: Medicare Other

## 2018-09-21 ENCOUNTER — Telehealth: Payer: Self-pay | Admitting: Nurse Practitioner

## 2018-09-21 NOTE — Telephone Encounter (Signed)
Copied from [id number]. Topic: Quick Communication - Rx Refill/Question >> Sep 21, 2018  3:08 PM Mcneil, Ja-Kwan wrote: Medication: lisinopril (PRINIVIL,ZESTRIL) 40 MG tablet and hydrochlorothiazide (MICROZIDE) 12.5 MG capsule  Has the patient contacted their pharmacy? no  Preferred Pharmacy (with phone number or street name): [facility], [place] [phone] (Phone)  [phone] (Fax)  Agent: Please be advised that RX refills may take up to 3 business days. We ask that you follow-up with your pharmacy.

## 2018-09-22 NOTE — Telephone Encounter (Signed)
Left vm for the pt to call back, advised the that pharmacy got 1 more refill left and they will get it ready for her.   Pt is due for follow up with [name] as well since 07/2018 for BP and lipid (fasting)--need to offer a webex for follow up.

## 2018-09-22 NOTE — Telephone Encounter (Signed)
Pt is aware, she said she will call and make an appt before she runs out of these meds---aware of Webex as well.

## 2018-09-25 ENCOUNTER — Telehealth: Payer: Self-pay | Admitting: Neurology

## 2018-09-25 NOTE — Telephone Encounter (Signed)
The pt called to cancel her sleep consult appt. I reached out to the pt on 09/12/18 to see if she would like to do a televisit or go ahead and r/s for a later date. Pt declined to do either of these she stated she will call back to r/s.

## 2018-12-29 ENCOUNTER — Other Ambulatory Visit: Payer: Self-pay | Admitting: Family Medicine

## 2018-12-29 DIAGNOSIS — Z780 Asymptomatic menopausal state: Secondary | ICD-10-CM

## 2018-12-29 DIAGNOSIS — M810 Age-related osteoporosis without current pathological fracture: Secondary | ICD-10-CM

## 2019-02-12 ENCOUNTER — Ambulatory Visit: Payer: Medicare Other

## 2019-03-16 ENCOUNTER — Other Ambulatory Visit: Payer: Self-pay

## 2019-03-16 ENCOUNTER — Ambulatory Visit
Admission: RE | Admit: 2019-03-16 | Discharge: 2019-03-16 | Disposition: A | Discharge status: Home / Self Care | Payer: Medicare Other | Source: Ambulatory Visit | Attending: Nurse Practitioner | Admitting: Nurse Practitioner

## 2019-03-16 ENCOUNTER — Ambulatory Visit
Admission: RE | Admit: 2019-03-16 | Discharge: 2019-03-16 | Disposition: A | Discharge status: Home / Self Care | Payer: Medicare Other | Source: Ambulatory Visit | Attending: Family Medicine | Admitting: Family Medicine

## 2019-03-16 DIAGNOSIS — M810 Age-related osteoporosis without current pathological fracture: Secondary | ICD-10-CM

## 2019-03-16 DIAGNOSIS — Z780 Asymptomatic menopausal state: Secondary | ICD-10-CM

## 2019-03-16 DIAGNOSIS — Z1231 Encounter for screening mammogram for malignant neoplasm of breast: Secondary | ICD-10-CM

## 2019-03-20 ENCOUNTER — Other Ambulatory Visit: Payer: Self-pay | Admitting: Nurse Practitioner

## 2019-03-20 DIAGNOSIS — I10 Essential (primary) hypertension: Secondary | ICD-10-CM

## 2019-07-06 ENCOUNTER — Telehealth: Payer: Self-pay | Admitting: Nurse Practitioner

## 2019-07-06 NOTE — Telephone Encounter (Signed)
Request refill for Lisinopril as well.

## 2019-07-06 NOTE — Telephone Encounter (Signed)
Left vm for the pt to call back, need to offer virtual visit with [name] to address BP med refill.   Hydrochlorothiazide 12.5 mg cap.

## 2019-07-09 NOTE — Telephone Encounter (Signed)
Left another message for the pt to call back, need to make an appt.

## 2019-07-11 ENCOUNTER — Encounter: Payer: Self-pay | Admitting: Nurse Practitioner

## 2019-07-11 ENCOUNTER — Ambulatory Visit (INDEPENDENT_AMBULATORY_CARE_PROVIDER_SITE_OTHER): Payer: Medicare Other | Admitting: Nurse Practitioner

## 2019-07-11 VITALS — BP 134/84 | Ht 62.0 in | Wt 224.0 lb

## 2019-07-11 DIAGNOSIS — J4521 Mild intermittent asthma with (acute) exacerbation: Secondary | ICD-10-CM

## 2019-07-11 DIAGNOSIS — J4520 Mild intermittent asthma, uncomplicated: Secondary | ICD-10-CM | POA: Diagnosis not present

## 2019-07-11 DIAGNOSIS — E7800 Pure hypercholesterolemia, unspecified: Secondary | ICD-10-CM

## 2019-07-11 DIAGNOSIS — I10 Essential (primary) hypertension: Secondary | ICD-10-CM

## 2019-07-11 DIAGNOSIS — J302 Other seasonal allergic rhinitis: Secondary | ICD-10-CM | POA: Diagnosis not present

## 2019-07-11 MED ORDER — MONTELUKAST SODIUM 10 MG PO TABS: 10.0000 mg | ORAL_TABLET | Freq: Every day | ORAL | 3 refills | Status: DC

## 2019-07-11 MED ORDER — ALBUTEROL SULFATE HFA 108 (90 BASE) MCG/ACT IN AERS: 1.0000 | INHALATION_SPRAY | Freq: Four times a day (QID) | RESPIRATORY_TRACT | 0 refills | Status: DC | PRN

## 2019-07-11 MED ORDER — LISINOPRIL 40 MG PO TABS: 40.0000 mg | ORAL_TABLET | Freq: Every day | ORAL | 3 refills | Status: DC

## 2019-07-11 MED ORDER — HYDROCHLOROTHIAZIDE 12.5 MG PO CAPS: 12.5000 mg | ORAL_CAPSULE | Freq: Every day | ORAL | 1 refills | Status: DC

## 2019-07-11 MED ORDER — ATORVASTATIN CALCIUM 10 MG PO TABS: 10.0000 mg | ORAL_TABLET | Freq: Every day | ORAL | 3 refills | Status: DC

## 2019-07-11 NOTE — Progress Notes (Signed)
Virtual Visit via Video Note  I connected with@ on 07/11/19 at 11:00 AM EST by telephone and verified that I am speaking with the correct person using two identifiers.  Location: Patient:Home Provider: Office Participants: patient and provider  I discussed the limitations of evaluation and management by telemedicine and the availability of in person appointments. I also discussed with the patient that there may be a patient responsible charge related to this service. The patient expressed understanding and agreed to proceed.  CC:HTN, asthma, and hyperlipidemia f/up  History of Present Illness: Ms. Gade is a 74 yrs old widow with 2adult children. She lives alone. She denies any acute compliants today. Denies any falls in last 72months.  HTN: Improved with amlodipine, lisinopril and HCTZ BP Readings from Last 3 Encounters:  07/11/19 134/84  07/14/18 (!) 140/98  06/30/18 (!) 160/102   Hyperlipidemia: Stable LDL with atorvastatin, denies any joint or muscle pain. Reports she has made modifications to diet (low fat/carb). Limited exercise due to COVID pandemic and does not feel safe walking in her neighborhood.  Lipid Panel     Component Value Date/Time   CHOL 182 03/13/2018 0948   CHOL 210 (H) 01/31/2014 1002   TRIG 133.0 03/13/2018 0948   HDL 44.10 03/13/2018 0948   HDL 47 01/31/2014 1002   CHOLHDL 4 03/13/2018 0948   VLDL 26.6 03/13/2018 0948   LDLCALC 111 (H) 03/13/2018 0948   LDLCALC 135 (H) 01/31/2014 1002   LABVLDL 28 01/31/2014 1002   Asthma: Controlled with montelukast and prn use of albuterol (<2x/week)  Observations/Objective: Alert and oriented x 4 Normal speech and voice tone  Assessment and Plan: Willine was seen today for follow-up.  Diagnoses and all orders for this visit:  Essential hypertension -     lisinopril (ZESTRIL) 40 MG tablet; Take 1 tablet (40 mg total) by mouth daily. -     hydrochlorothiazide (MICROZIDE) 12.5 MG capsule; Take 1 capsule  (12.5 mg total) by mouth daily.  Mild intermittent asthma without complication -     albuterol (PROAIR HFA) 108 (90 Base) MCG/ACT inhaler; Inhale 1-2 puffs into the lungs every 6 (six) hours as needed for wheezing or shortness of breath.  Elevated LDL cholesterol level -     atorvastatin (LIPITOR) 10 MG tablet; Take 1 tablet (10 mg total) by mouth daily at 6 PM.  Other seasonal allergic rhinitis -     montelukast (SINGULAIR) 10 MG tablet; Take 1 tablet (10 mg total) by mouth at bedtime.  Mild intermittent asthma with acute exacerbation -     montelukast (SINGULAIR) 10 MG tablet; Take 1 tablet (10 mg total) by mouth at bedtime.   Follow Up Instructions: See avs   I discussed the assessment and treatment plan with the patient. The patient was provided an opportunity to ask questions and all were answered. The patient agreed with the plan and demonstrated an understanding of the instructions.   The patient was advised to call back or seek an in-person evaluation if the symptoms worsen or if the condition fails to improve as anticipated.  I provided 12 minutes of non-face-to-face time during this encounter.   [name], NP

## 2019-07-12 ENCOUNTER — Ambulatory Visit: Payer: Medicare Other | Admitting: Nurse Practitioner

## 2019-08-14 NOTE — Progress Notes (Signed)
Virtual Visit via Audio Note  I connected with patient on 08/15/19 at 10:15 AM EST by audio enabled telemedicine application and verified that I am speaking with the correct person using two identifiers.   THIS ENCOUNTER IS A VIRTUAL VISIT DUE TO COVID-19 - PATIENT WAS NOT SEEN IN THE OFFICE. PATIENT HAS CONSENTED TO VIRTUAL VISIT / TELEMEDICINE VISIT   Location of patient: home  Location of provider: office  I discussed the limitations of evaluation and management by telemedicine and the availability of in person appointments. The patient expressed understanding and agreed to proceed.   Subjective:   Penny Gibson is a 74 y.o. female who presents for Medicare Annual (Subsequent) preventive examination.  Pt enjoys painting.  Review of Systems:  Home Safety/Smoke Alarms: Feels safe in home. Smoke alarms in place.  Lives alone in 1 story apt. Has a friend that checks on her daily and lives in the same apt complex.   Female:      Mammo-   03/16/19    Dexa scan-  03/16/19      CCS- Cologuard 10/05/16- negative    Objective:     Vitals: Unable to assess. This visit is enabled though telemedicine due to Covid 19.   Advanced Directives 08/15/2019 04/30/2017 04/03/2015 11/15/2013 09/19/2013  Does Patient Have a Medical Advance Directive? No No No Patient does not have advance directive Patient does not have advance directive  Would patient like information on creating a medical advance directive? No - Patient declined - No - patient declined information - -  Pre-existing out of facility DNR order (yellow form or pink MOST form) - - - - No    Tobacco Social History   Tobacco Use  Smoking Status Never Smoker  Smokeless Tobacco Never Used     Counseling given: Not Answered   Clinical Intake: Pain : No/denies pain      Past Medical History:  Diagnosis Date  . Anemia   . Arthritis   . Hyperlipidemia   . Hypertension    Past Surgical History:  Procedure  Laterality Date  . BREAST BIOPSY Left 2010  . BREAST LUMPECTOMY Left 2010  . TUBAL LIGATION     Family History  Problem Relation Age of Onset  . Heart attack Mother   . Heart disease Mother   . Cancer Sister   . Breast cancer Sister 62  . Stroke Maternal Grandmother    Social History   Socioeconomic History  . Marital status: Divorced    Spouse name: Not on file  . Number of children: Not on file  . Years of education: Not on file  . Highest education level: Not on file  Occupational History  . Not on file  Tobacco Use  . Smoking status: Never Smoker  . Smokeless tobacco: Never Used  Substance and Sexual Activity  . Alcohol use: No  . Drug use: No  . Sexual activity: Never    Birth control/protection: Surgical  Other Topics Concern  . Not on file  Social History Narrative  . Not on file   Social Determinants of Health   Financial Resource Strain: Low Risk   . Difficulty of Paying Living Expenses: Not hard at all  Food Insecurity:   . Worried About Charity fundraiser in the Last Year: Not on file  . Ran Out of Food in the Last Year: Not on file  Transportation Needs: No Transportation Needs  . Lack of Transportation (Medical): No  . Lack  of Transportation (Non-Medical): No  Physical Activity:   . Days of Exercise per Week: Not on file  . Minutes of Exercise per Session: Not on file  Stress:   . Feeling of Stress : Not on file  Social Connections:   . Frequency of Communication with Friends and Family: Not on file  . Frequency of Social Gatherings with Friends and Family: Not on file  . Attends Religious Services: Not on file  . Active Member of Clubs or Organizations: Not on file  . Attends Archivist Meetings: Not on file  . Marital Status: Not on file    Outpatient Encounter Medications as of 08/15/2019  Medication Sig  . albuterol (PROAIR HFA) 108 (90 Base) MCG/ACT inhaler Inhale 1-2 puffs into the lungs every 6 (six) hours as needed for  wheezing or shortness of breath.  Marland Kitchen amLODipine (NORVASC) 5 MG tablet Take 1 tablet (5 mg total) by mouth daily.  Marland Kitchen aspirin 81 MG tablet Take 81 mg by mouth daily.  . hydrochlorothiazide (MICROZIDE) 12.5 MG capsule Take 1 capsule (12.5 mg total) by mouth daily.  Marland Kitchen lisinopril (ZESTRIL) 40 MG tablet Take 1 tablet (40 mg total) by mouth daily.  . montelukast (SINGULAIR) 10 MG tablet Take 1 tablet (10 mg total) by mouth at bedtime.  Marland Kitchen atorvastatin (LIPITOR) 10 MG tablet Take 1 tablet (10 mg total) by mouth daily at 6 PM. (Patient not taking: Reported on 08/15/2019)  . budesonide-formoterol (SYMBICORT) 160-4.5 MCG/ACT inhaler Inhale 1 puff into the lungs 2 (two) times daily. Rinse mouth after use (Patient not taking: Reported on 08/15/2019)  . ibuprofen (ADVIL,MOTRIN) 200 MG tablet Take 1 tablet (200 mg total) by mouth every 8 (eight) hours as needed. With food (Patient not taking: Reported on 08/15/2019)   No facility-administered encounter medications on file as of 08/15/2019.    Activities of Daily Living In your present state of health, do you have any difficulty performing the following activities: 08/15/2019  Hearing? N  Vision? N  Difficulty concentrating or making decisions? N  Walking or climbing stairs? Y  Dressing or bathing? N  Doing errands, shopping? Y  Comment Dtr drives her where needed.  Preparing Food and eating ? N  Using the Toilet? N  In the past six months, have you accidently leaked urine? N  Do you have problems with loss of bowel control? N  Managing your Medications? N  Managing your Finances? N  Housekeeping or managing your Housekeeping? N  Some recent data might be hidden    Patient Care Team: [name], [name], NP as PCP - General (Internal Medicine) Derek Jack, MD as Consulting Physician (Hematology)    Assessment:   This is a routine wellness examination for [place]. Physical assessment deferred to PCP.  Exercise Activities and Dietary  recommendations Current Exercise Habits: Home exercise routine, Type of exercise: stretching, Time (Minutes): 45, Frequency (Times/Week): 3, Weekly Exercise (Minutes/Week): 135, Intensity: Mild, Exercise limited by: None identified Diet (meal preparation, eat out, water intake, caffeinated beverages, dairy products, fruits and vegetables): in general, a "healthy" diet  , well balanced   Goals    . DIET - INCREASE WATER INTAKE       Fall Risk Fall Risk  08/15/2019 07/11/2019 11/04/2017 06/07/2017 09/24/2016  Falls in the past year? 0 0 No No No  Comment - - - - -  Number falls in past yr: 0 - - - -  Injury with Fall? 0 - - - -  Risk  for fall due to : - - - - -  Follow up Education provided;Falls prevention discussed - - - -   Depression Screen PHQ 2/9 Scores 07/11/2019 11/04/2017 06/07/2017 09/24/2016  PHQ - 2 Score 0 0 0 0  PHQ- 9 Score - - - -     Cognitive Function Ad8 score reviewed for issues:  Issues making decisions:no  Less interest in hobbies / activities:no  Repeats questions, stories (family complaining):no  Trouble using ordinary gadgets (microwave, computer, phone):no  Forgets the month or year: no  Mismanaging finances: no  Remembering appts:no Daily problems with thinking and/or memory:no Ad8 score is=0   MMSE - Mini Mental State Exam 01/31/2014  Orientation to time 5  Orientation to Place 5  Registration 3  Attention/ Calculation 5  Recall 3  Language- name 2 objects 2  Language- repeat 1  Language- follow 3 step command 3  Language- read & follow direction 1  Write a sentence 1  Copy design 1  Total score 30        Immunization History  Administered Date(s) Administered  . Influenza, High Dose Seasonal PF 03/19/2016, 04/12/2017  . Influenza,inj,Quad PF,6+ Mos 07/26/2014  . Influenza-Unspecified 03/21/2013, 04/21/2018  . Pneumococcal Conjugate-13 07/26/2014  . Pneumococcal Polysaccharide-23 09/20/2013  . Tdap 06/22/2013    Screening  Tests Health Maintenance  Topic Date Due  . INFLUENZA VACCINE  01/20/2019  . Fecal DNA (Cologuard)  10/06/2019  . MAMMOGRAM  03/15/2021  . TETANUS/TDAP  06/23/2023  . DEXA SCAN  Completed  . Hepatitis C Screening  Completed  . PNA vac Low Risk Adult  Completed       Plan:   See you next year!  Continue to eat heart healthy diet (full of fruits, vegetables, whole grains, lean protein, water--limit salt, fat, and sugar intake) and increase physical activity as tolerated.  Continue doing brain stimulating activities (puzzles, reading, adult coloring books, staying active) to keep memory sharp.   Bring a copy of your living will and/or healthcare power of attorney to your next office visit.    I have personally reviewed and noted the following in the patient's chart:   . Medical and social history . Use of alcohol, tobacco or illicit drugs  . Current medications and supplements . Functional ability and status . Nutritional status . Physical activity . Advanced directives . List of other physicians . Hospitalizations, surgeries, and ER visits in previous 12 months . Vitals . Screenings to include cognitive, depression, and falls . Referrals and appointments  In addition, I have reviewed and discussed with patient certain preventive protocols, quality metrics, and best practice recommendations. A written personalized care plan for preventive services as well as general preventive health recommendations were provided to patient.     Naaman Plummer [place], [place]  08/15/2019

## 2019-08-15 ENCOUNTER — Encounter: Payer: Self-pay | Admitting: *Deleted

## 2019-08-15 ENCOUNTER — Ambulatory Visit (INDEPENDENT_AMBULATORY_CARE_PROVIDER_SITE_OTHER): Payer: Medicare Other | Admitting: *Deleted

## 2019-08-15 DIAGNOSIS — Z0000 Encounter for general adult medical examination without abnormal findings: Secondary | ICD-10-CM | POA: Diagnosis not present

## 2019-08-15 NOTE — Patient Instructions (Signed)
See you next year!  Continue to eat heart healthy diet (full of fruits, vegetables, whole grains, lean protein, water--limit salt, fat, and sugar intake) and increase physical activity as tolerated.  Continue doing brain stimulating activities (puzzles, reading, adult coloring books, staying active) to keep memory sharp.   Bring a copy of your living will and/or healthcare power of attorney to your next office visit.   Penny Gibson , Thank you for taking time to come for your Medicare Wellness Visit. I appreciate your ongoing commitment to your health goals. Please review the following plan we discussed and let me know if I can assist you in the future.   These are the goals we discussed: Goals    . DIET - INCREASE WATER INTAKE       This is a list of the screening recommended for you and due dates:  Health Maintenance  Topic Date Due  . Flu Shot  01/20/2019  . Cologuard (Stool DNA test)  10/06/2019  . Mammogram  03/15/2021  . Tetanus Vaccine  06/23/2023  . DEXA scan (bone density measurement)  Completed  .  Hepatitis C: One time screening is recommended by Center for Disease Control  (CDC) for  adults born from [birth year] through 1964.   Completed  . Pneumonia vaccines  Completed    Preventive Care 58 Years and Older, Female Preventive care refers to lifestyle choices and visits with your health care provider that can promote health and wellness. This includes:  A yearly physical exam. This is also called an annual well check.  Regular dental and eye exams.  Immunizations.  Screening for certain conditions.  Healthy lifestyle choices, such as diet and exercise. What can I expect for my preventive care visit? Physical exam Your health care provider will check:  Height and weight. These may be used to calculate body mass index (BMI), which is a measurement that tells if you are at a healthy weight.  Heart rate and blood pressure.  Your skin for abnormal spots. Counseling  Your health care provider may ask you questions about:  Alcohol, tobacco, and drug use.  Emotional well-being.  Home and relationship well-being.  Sexual activity.  Eating habits.  History of falls.  Memory and ability to understand (cognition).  Work and work Statistician.  Pregnancy and menstrual history. What immunizations do I need?  Influenza (flu) vaccine  This is recommended every year. Tetanus, diphtheria, and pertussis (Tdap) vaccine  You may need a Td booster every 10 years. Varicella (chickenpox) vaccine  You may need this vaccine if you have not already been vaccinated. Zoster (shingles) vaccine  You may need this after age 52. Pneumococcal conjugate (PCV13) vaccine  One dose is recommended after age 60. Pneumococcal polysaccharide (PPSV23) vaccine  One dose is recommended after age 43. Measles, mumps, and rubella (MMR) vaccine  You may need at least one dose of MMR if you were born in 1957 or later. You may also need a second dose. Meningococcal conjugate (MenACWY) vaccine  You may need this if you have certain conditions. Hepatitis A vaccine  You may need this if you have certain conditions or if you travel or work in places where you may be exposed to hepatitis A. Hepatitis B vaccine  You may need this if you have certain conditions or if you travel or work in places where you may be exposed to hepatitis B. Haemophilus influenzae type b (Hib) vaccine  You may need this if you have certain conditions. You  may receive vaccines as individual doses or as more than one vaccine together in one shot (combination vaccines). Talk with your health care provider about the risks and benefits of combination vaccines. What tests do I need? Blood tests  Lipid and cholesterol levels. These may be checked every 5 years, or more frequently depending on your overall health.  Hepatitis C test.  Hepatitis B test. Screening  Lung cancer screening. You may  have this screening every year starting at age 24 if you have a 30-pack-year history of smoking and currently smoke or have quit within the past 15 years.  Colorectal cancer screening. All adults should have this screening starting at age 1 and continuing until age 47. Your health care provider may recommend screening at age 24 if you are at increased risk. You will have tests every 1-10 years, depending on your results and the type of screening test.  Diabetes screening. This is done by checking your blood sugar (glucose) after you have not eaten for a while (fasting). You may have this done every 1-3 years.  Mammogram. This may be done every 1-2 years. Talk with your health care provider about how often you should have regular mammograms.  BRCA-related cancer screening. This may be done if you have a family history of breast, ovarian, tubal, or peritoneal cancers. Other tests  Sexually transmitted disease (STD) testing.  Bone density scan. This is done to screen for osteoporosis. You may have this done starting at age 44. Follow these instructions at home: Eating and drinking  Eat a diet that includes fresh fruits and vegetables, whole grains, lean protein, and low-fat dairy products. Limit your intake of foods with high amounts of sugar, saturated fats, and salt.  Take vitamin and mineral supplements as recommended by your health care provider.  Do not drink alcohol if your health care provider tells you not to drink.  If you drink alcohol: ? Limit how much you have to 0-1 drink a day. ? Be aware of how much alcohol is in your drink. In the [place] one drink equals one 12 oz bottle of beer (355 mL), one 5 oz glass of wine (148 mL), or one 1 oz glass of hard liquor (44 mL). Lifestyle  Take daily care of your teeth and gums.  Stay active. Exercise for at least 30 minutes on 5 or more days each week.  Do not use any products that contain nicotine or tobacco, such as cigarettes,  e-cigarettes, and chewing tobacco. If you need help quitting, ask your health care provider.  If you are sexually active, practice safe sex. Use a condom or other form of protection in order to prevent STIs (sexually transmitted infections).  Talk with your health care provider about taking a low-dose aspirin or statin. What's next?  Go to your health care provider once a year for a well check visit.  Ask your health care provider how often you should have your eyes and teeth checked.  Stay up to date on all vaccines. This information is not intended to replace advice given to you by your health care provider. Make sure you discuss any questions you have with your health care provider. Document Revised: 06/01/2018 Document Reviewed: 06/01/2018 Elsevier Patient Education  2020 [employer].

## 2019-10-02 ENCOUNTER — Telehealth: Payer: Self-pay | Admitting: Nurse Practitioner

## 2019-10-02 DIAGNOSIS — M170 Bilateral primary osteoarthritis of knee: Secondary | ICD-10-CM

## 2019-10-02 NOTE — Telephone Encounter (Signed)
Pt called back and she was asking if she could get cortizone injections in her knees. She was needing a knee replacement but she was told to lose weight and her knees are hurting her so she was going to see what you recommend.  Pt also was asking how to go about getting a Living Will and if you do the forms for that?  Charlotte please advise.

## 2019-10-02 NOTE — Telephone Encounter (Signed)
She needs to contact ortho for knee injections. We can provide a copy of advanced directive form, but she needs to complete form and sign with her lawyer.

## 2019-10-02 NOTE — Telephone Encounter (Signed)
Left a voice mail for patient to call back.

## 2019-10-02 NOTE — Telephone Encounter (Addendum)
Patient is calling and wanted to speak to someone regarding getting injections for her knee and information on how to get a living will. CB is [phone]

## 2019-10-03 NOTE — Telephone Encounter (Signed)
Charlotte please advise.  Pt ask if she could get a referral to Ortho. Pt states she doesn't have a orthopedic dr.

## 2019-10-03 NOTE — Telephone Encounter (Signed)
Ok to enter referral 

## 2019-10-04 NOTE — Addendum Note (Signed)
Addended by: Lucila Maine on: 10/04/2019 08:44 AM   Modules accepted: Orders

## 2019-10-04 NOTE — Addendum Note (Signed)
Addended by: Lucila Maine on: 10/04/2019 08:53 AM   Modules accepted: Orders

## 2019-10-16 ENCOUNTER — Encounter: Payer: Self-pay | Admitting: Orthopaedic Surgery

## 2019-10-16 ENCOUNTER — Ambulatory Visit: Payer: Self-pay

## 2019-10-16 ENCOUNTER — Other Ambulatory Visit: Payer: Self-pay

## 2019-10-16 ENCOUNTER — Ambulatory Visit (INDEPENDENT_AMBULATORY_CARE_PROVIDER_SITE_OTHER): Payer: Medicare Other | Admitting: Orthopaedic Surgery

## 2019-10-16 VITALS — Ht 61.0 in | Wt 231.4 lb

## 2019-10-16 DIAGNOSIS — M1711 Unilateral primary osteoarthritis, right knee: Secondary | ICD-10-CM

## 2019-10-16 DIAGNOSIS — M1712 Unilateral primary osteoarthritis, left knee: Secondary | ICD-10-CM | POA: Diagnosis not present

## 2019-10-16 DIAGNOSIS — Z6841 Body Mass Index (BMI) 40.0 and over, adult: Secondary | ICD-10-CM | POA: Diagnosis not present

## 2019-10-16 MED ORDER — METHYLPREDNISOLONE ACETATE 40 MG/ML IJ SUSP
40.0000 mg | INTRAMUSCULAR | Status: AC | PRN
  Administered 2019-10-16: 40 mg via INTRA_ARTICULAR

## 2019-10-16 MED ORDER — BUPIVACAINE HCL 0.5 % IJ SOLN
2.0000 mL | INTRAMUSCULAR | Status: AC | PRN
  Administered 2019-10-16: 2 mL via INTRA_ARTICULAR

## 2019-10-16 MED ORDER — LIDOCAINE HCL 1 % IJ SOLN
2.0000 mL | INTRAMUSCULAR | Status: AC | PRN
  Administered 2019-10-16: 2 mL

## 2019-10-16 MED ORDER — LIDOCAINE HCL 1 % IJ SOLN
2.0000 mL | INTRAMUSCULAR | Status: AC | PRN
  Administered 2019-10-16: 11:00:00 2 mL

## 2019-10-16 NOTE — Addendum Note (Signed)
Addended by: Precious Bard on: 10/16/2019 01:05 PM   Modules accepted: Orders

## 2019-10-16 NOTE — Addendum Note (Signed)
Addended by: Precious Bard on: 10/16/2019 02:32 PM   Modules accepted: Orders

## 2019-10-16 NOTE — Addendum Note (Signed)
Addended by: Robyne Peers on: 10/16/2019 11:43 AM   Modules accepted: Orders

## 2019-10-16 NOTE — Progress Notes (Signed)
Office Visit Note   Patient: Penny Gibson           Date of Birth: 05-Jan-1946           MRN: [id number] Visit Date: 10/16/2019              Requested by: Flossie Buffy, NP [place],  [place] [id number] PCP: Flossie Buffy, NP   Assessment & Plan: Visit Diagnoses:  1. Primary osteoarthritis of right knee   2. Primary osteoarthritis of left knee   3. BMI 40.0-44.9, adult ([place])   4. Morbid obesity ([place])     Plan: Impression is end-stage bilateral knee DJD worse on the left.  Patient will continue use Voltaren gel as needed.  She takes Tylenol as well.  She does have effusion on the left knee which we aspirated and then injected with cortisone today.  I obtained 20 cc of joint fluid.  We performed cortisone injection on the right knee as well.  We talked about her increased BMI how this affects her perioperative risk should she decide to undergo knee replacement.  Her current BMI is 43.  Target weight is around 200 pounds.  She will make all efforts to lose weight.  Referral to [facility] made today.  Questions encouraged and answered.  Follow-Up Instructions: Return if symptoms worsen or fail to improve.   Orders:  Orders Placed This Encounter  Procedures  . Large Joint Inj  . XR KNEE 3 VIEW LEFT  . XR KNEE 3 VIEW RIGHT   No orders of the defined types were placed in this encounter.     Procedures: Large Joint Inj: bilateral knee on 10/16/2019 11:24 AM Indications: pain Details: 22 G needle  Arthrogram: No  Medications (Right): 2 mL lidocaine 1 %; 2 mL bupivacaine 0.5 %; 40 mg methylPREDNISolone acetate 40 MG/ML Medications (Left): 2 mL lidocaine 1 %; 2 mL bupivacaine 0.5 %; 40 mg methylPREDNISolone acetate 40 MG/ML Outcome: tolerated well, no immediate complications Patient was prepped and draped in the usual sterile fashion.       Clinical Data: No additional findings.   Subjective: Chief Complaint  Patient presents  with  . Left Knee - Pain  . Right Knee - Pain    Penny Gibson is a very pleasant 75 year old female who comes in for evaluation of bilateral knee pain worse on the left for several years.  This is significantly functionally limiting.  Denies any numbness and tingling.  Pain is worse with increased activity and standing.  Unable to walk more than a city block without stopping.  Denies any mechanical symptoms.  She previously had left knee aspiration injection back in 2018 with Dr. Paulla Fore which gave her about 2 months of relief.  She continues to have chronic pain that is functionally limiting.  Denies any previous knee surgeries or injuries.   Review of Systems  Constitutional: Negative.   HENT: Negative.   Eyes: Negative.   Respiratory: Negative.   Cardiovascular: Negative.   Endocrine: Negative.   Musculoskeletal: Negative.   Neurological: Negative.   Hematological: Negative.   Psychiatric/Behavioral: Negative.   All other systems reviewed and are negative.    Objective: Vital Signs: Ht 5\' 1"  (1.549 m)   Wt 231 lb 6.4 oz (105 kg)   BMI 43.72 kg/m   Physical Exam Vitals and nursing note reviewed.  Constitutional:      Appearance: She is well-developed.  HENT:     Head: Normocephalic and atraumatic.  Pulmonary:     Effort: Pulmonary effort is normal.  Abdominal:     Palpations: Abdomen is soft.  Musculoskeletal:     Cervical back: Neck supple.  Skin:    General: Skin is warm.     Capillary Refill: Capillary refill takes less than 2 seconds.  Neurological:     Mental Status: She is alert and oriented to person, place, and time.  Psychiatric:        Behavior: Behavior normal.        Thought Content: Thought content normal.        Judgment: Judgment normal.     Ortho Exam Left knee shows a moderate joint effusion with slight limitation range of motion secondary to the effusion.  Collaterals and cruciates are stable.  1+ patellofemoral crepitus.  No joint line  tenderness.  Right knee shows a trace joint effusion.  Minimal restriction range of motion.  Because her cruciates are stable. Specialty Comments:  No specialty comments available.  Imaging: XR KNEE 3 VIEW LEFT  Result Date: 10/16/2019 End-stage tricompartmental DJD with slight varus deformity  XR KNEE 3 VIEW RIGHT  Result Date: 10/16/2019 End-stage tricompartmental DJD with slight varus deformity    PMFS History: Patient Active Problem List   Diagnosis Date Noted  . Cardiomegaly 06/27/2018  . Iron deficiency anemia 07/19/2017  . 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 Asthma, mild intermittent 05/28/2016  . Other seasonal allergic rhinitis 07/26/2014  . Atypical squamous cells of undetermined significance (ASCUS) on Papanicolaou smear of cervix 01/31/2014  . BMI 40.0-44.9, adult ([place]) 11/15/2013  . Essential hypertension 09/19/2013  . Generalized osteoarthritis of multiple sites    Past Medical History:  Diagnosis Date  . Anemia   . Arthritis   . Hyperlipidemia   . Hypertension     Family History  Problem Relation Age of Onset  . Heart attack Mother   . Heart disease Mother   . Cancer Sister   . Breast cancer Sister 63  . Stroke Maternal Grandmother     Past Surgical History:  Procedure Laterality Date  . BREAST BIOPSY Left 2010  . BREAST LUMPECTOMY Left 2010  . TUBAL LIGATION     Social History   Occupational History  . Not on file  Tobacco Use  . Smoking status: Never Smoker  . Smokeless tobacco: Never Used  Substance and Sexual Activity  . Alcohol use: No  . Drug use: No  . Sexual activity: Never    Birth control/protection: Surgical

## 2019-10-17 LAB — CELL COUNT + DIFF, W/O CRYST-SYNVL FLD
Basophils, %: 0 %
Eosinophils-Synovial: 0 % (ref 0–2)
Lymphocytes-Synovial Fld: 43 % (ref 0–74)
Monocyte/Macrophage: 51 % (ref 0–69)
Neutrophil, Synovial: 6 % (ref 0–24)
Synoviocytes, %: 0 % (ref 0–15)
WBC, Synovial: 259 cells/uL — ABNORMAL HIGH (ref <150)

## 2019-10-17 LAB — TIQ-NTM

## 2019-10-17 NOTE — Progress Notes (Signed)
Fluid is normal.

## 2019-12-27 ENCOUNTER — Other Ambulatory Visit: Payer: Self-pay

## 2019-12-27 ENCOUNTER — Encounter (INDEPENDENT_AMBULATORY_CARE_PROVIDER_SITE_OTHER): Payer: Self-pay | Admitting: Family Medicine

## 2019-12-27 ENCOUNTER — Ambulatory Visit (INDEPENDENT_AMBULATORY_CARE_PROVIDER_SITE_OTHER): Payer: Medicare Other | Admitting: Family Medicine

## 2019-12-27 ENCOUNTER — Other Ambulatory Visit: Payer: Self-pay | Admitting: Nurse Practitioner

## 2019-12-27 VITALS — BP 110/76 | HR 96 | Temp 97.9°F | Ht 61.0 in | Wt 226.0 lb

## 2019-12-27 DIAGNOSIS — Z6841 Body Mass Index (BMI) 40.0 and over, adult: Secondary | ICD-10-CM | POA: Diagnosis not present

## 2019-12-27 DIAGNOSIS — E66813 Obesity, class 3: Secondary | ICD-10-CM

## 2019-12-27 DIAGNOSIS — D649 Anemia, unspecified: Secondary | ICD-10-CM

## 2019-12-27 DIAGNOSIS — I10 Essential (primary) hypertension: Secondary | ICD-10-CM | POA: Diagnosis not present

## 2019-12-27 DIAGNOSIS — R5383 Other fatigue: Secondary | ICD-10-CM

## 2019-12-27 DIAGNOSIS — Z1331 Encounter for screening for depression: Secondary | ICD-10-CM | POA: Diagnosis not present

## 2019-12-27 DIAGNOSIS — E7849 Other hyperlipidemia: Secondary | ICD-10-CM

## 2019-12-27 DIAGNOSIS — R0602 Shortness of breath: Secondary | ICD-10-CM

## 2019-12-27 DIAGNOSIS — Z0289 Encounter for other administrative examinations: Secondary | ICD-10-CM

## 2019-12-27 DIAGNOSIS — E559 Vitamin D deficiency, unspecified: Secondary | ICD-10-CM

## 2019-12-30 LAB — ANEMIA PANEL
Ferritin: 66 ng/mL (ref 15–150)
Folate, Hemolysate: 475 ng/mL
Folate, RBC: 1327 ng/mL (ref >498)
Hematocrit: 35.8 % (ref 34.0–46.6)
Iron Saturation: 14 % — ABNORMAL LOW (ref 15–55)
Iron: 44 ug/dL (ref 27–139)
Retic Ct Pct: 1.4 % (ref 0.6–2.6)
Total Iron Binding Capacity: 319 ug/dL (ref 250–450)
UIBC: 275 ug/dL (ref 118–369)
Vitamin B-12: 941 pg/mL (ref 232–1245)

## 2019-12-30 LAB — CBC WITH DIFFERENTIAL/PLATELET
Basophils Absolute: 0 10*3/uL (ref 0.0–0.2)
Basos: 0 %
EOS (ABSOLUTE): 0.1 10*3/uL (ref 0.0–0.4)
Eos: 1 %
Hemoglobin: 11.6 g/dL (ref 11.1–15.9)
Immature Grans (Abs): 0 10*3/uL (ref 0.0–0.1)
Immature Granulocytes: 0 %
Lymphocytes Absolute: 1.9 10*3/uL (ref 0.7–3.1)
Lymphs: 18 %
MCH: 27.7 pg (ref 26.6–33.0)
MCHC: 32.4 g/dL (ref 31.5–35.7)
MCV: 85 fL (ref 79–97)
Monocytes Absolute: 0.7 10*3/uL (ref 0.1–0.9)
Monocytes: 6 %
Neutrophils Absolute: 8 10*3/uL — ABNORMAL HIGH (ref 1.4–7.0)
Neutrophils: 75 %
Platelets: 321 10*3/uL (ref 150–450)
RBC: 4.19 x10E6/uL (ref 3.77–5.28)
RDW: 14.3 % (ref 11.7–15.4)
WBC: 10.8 10*3/uL (ref 3.4–10.8)

## 2019-12-30 LAB — COMPREHENSIVE METABOLIC PANEL
ALT: 14 IU/L (ref 0–32)
AST: 15 IU/L (ref 0–40)
Albumin/Globulin Ratio: 1.1 — ABNORMAL LOW (ref 1.2–2.2)
Albumin: 4.4 g/dL (ref 3.7–4.7)
Alkaline Phosphatase: 103 IU/L (ref 48–121)
BUN/Creatinine Ratio: 16 (ref 12–28)
BUN: 15 mg/dL (ref 8–27)
Bilirubin Total: 0.3 mg/dL (ref 0.0–1.2)
CO2: 24 mmol/L (ref 20–29)
Calcium: 9.6 mg/dL (ref 8.7–10.3)
Chloride: 97 mmol/L (ref 96–106)
Creatinine, Ser: 0.94 mg/dL (ref 0.57–1.00)
GFR calc Af Amer: 70 mL/min/{1.73_m2} (ref >59)
GFR calc non Af Amer: 61 mL/min/{1.73_m2} (ref >59)
Globulin, Total: 3.9 g/dL (ref 1.5–4.5)
Glucose: 116 mg/dL — ABNORMAL HIGH (ref 65–99)
Potassium: 4.4 mmol/L (ref 3.5–5.2)
Sodium: 137 mmol/L (ref 134–144)
Total Protein: 8.3 g/dL (ref 6.0–8.5)

## 2019-12-30 LAB — LIPID PANEL WITH LDL/HDL RATIO
Cholesterol, Total: 227 mg/dL — ABNORMAL HIGH (ref 100–199)
HDL: 50 mg/dL (ref >39)
LDL Chol Calc (NIH): 152 mg/dL — ABNORMAL HIGH (ref 0–99)
LDL/HDL Ratio: 3 ratio (ref 0.0–3.2)
Triglycerides: 138 mg/dL (ref 0–149)
VLDL Cholesterol Cal: 25 mg/dL (ref 5–40)

## 2019-12-30 LAB — HEMOGLOBIN A1C
Est. average glucose Bld gHb Est-mCnc: 140 mg/dL
Hgb A1c MFr Bld: 6.5 % — ABNORMAL HIGH (ref 4.8–5.6)

## 2019-12-30 LAB — T4, FREE: Free T4: 1.09 ng/dL (ref 0.82–1.77)

## 2019-12-30 LAB — FOLATE: Folate: >20 ng/mL (ref >3.0)

## 2019-12-30 LAB — VITAMIN D 25 HYDROXY (VIT D DEFICIENCY, FRACTURES): Vit D, 25-Hydroxy: 29 ng/mL — ABNORMAL LOW (ref 30.0–100.0)

## 2019-12-30 LAB — INSULIN, RANDOM: INSULIN: 61.4 u[IU]/mL — ABNORMAL HIGH (ref 2.6–24.9)

## 2019-12-30 LAB — TSH: TSH: 1.75 u[IU]/mL (ref 0.450–4.500)

## 2019-12-30 LAB — T3: T3, Total: 114 ng/dL (ref 71–180)

## 2020-01-01 NOTE — Progress Notes (Signed)
Chief Complaint:   OBESITY Penny Gibson (MR# [id number]) is a 74 y.o. female who presents for evaluation and treatment of obesity and related comorbidities. Current BMI is Body mass index is 42.7 kg/m. Penny Gibson has been struggling with her weight for many years and has been unsuccessful in either losing weight, maintaining weight loss, or reaching her healthy weight goal.  Penny Gibson need her BMI to be below 40 to have her knee replacement surgery.  Penny Gibson is currently in the action stage of change and ready to dedicate time achieving and maintaining a healthier weight. Penny Gibson is interested in becoming our patient and working on intensive lifestyle modifications including (but not limited to) diet and exercise for weight loss.  Penny Gibson's habits were reviewed today and are as follows: her desired weight loss is 51 lbs, she started gaining weight at the age of 34, her heaviest weight ever was 245 pounds, she is a picky eater and doesn't like to eat healthier foods, she has significant food cravings issues, she snacks frequently in the evenings, she skips meals frequently, she frequently makes poor food choices, she frequently eats larger portions than normal and she struggles with emotional eating.  Depression Screen Penny Gibson's Food and Mood (modified PHQ-9) score was 16.  Depression screen PHQ 2/9 12/27/2019  Decreased Interest 3  Down, Depressed, Hopeless 3  PHQ - 2 Score 6  Altered sleeping 0  Tired, decreased energy 3  Change in appetite 1  Feeling bad or failure about yourself  2  Trouble concentrating 1  Moving slowly or fidgety/restless 2  Suicidal thoughts 1  PHQ-9 Score 16  Difficult doing work/chores Somewhat difficult   Subjective:   1. Other fatigue Penny Gibson admits to daytime somnolence and admits to waking up still tired. Patent has a history of symptoms of daytime fatigue and morning headache. Penny Gibson generally gets 5 hours of sleep per night, and states  that she has nightime awakenings. Snoring is present. Apneic episodes are not present. Epworth Sleepiness Score is 7.  2. Shortness of breath on exertion Penny Gibson notes increasing shortness of breath with exercising and seems to be worsening over time with weight gain. She notes getting out of breath sooner with activity than she used to. This has not gotten worse recently. Penny Gibson denies shortness of breath at rest or orthopnea.  3. Essential hypertension Penny Gibson's blood pressure is well controlled on her medications. She denies chest pain, and she would like to improve with weight loss.  4. Other hyperlipidemia Penny Gibson is working on diet and weight loss to improve cholesterol. She denies chest pain and she is not on statin.  5. Anemia, unspecified type Penny Gibson has a history of anemia, and she is not on iron.  6. Vitamin D deficiency Penny Gibson is not on Vit D, and she notes fatigue.  Assessment/Plan:   1. Other fatigue Penny Gibson does feel that her weight is causing her energy to be lower than it should be. Fatigue may be related to obesity, depression or many other causes. Labs will be ordered, and in the meanwhile, Penny Gibson will focus on self care including making healthy food choices, increasing physical activity and focusing on stress reduction.  - EKG 12-Lead - Hemoglobin A1c - Insulin, random - T3 - T4, free - TSH  2. Shortness of breath on exertion Penny Gibson does feel that she gets out of breath more easily that she used to when she exercises. Penny Gibson's shortness of breath appears to be obesity related and exercise induced.  She has agreed to work on weight loss and gradually increase exercise to treat her exercise induced shortness of breath. Will continue to monitor closely.  - CBC with Differential/Platelet  3. Essential hypertension Penny Gibson will start her Category 2 plan, and will continue her medications and continue working on healthy weight loss, and exercise to improve blood  pressure control. We will watch for signs of hypotension as she continues her lifestyle modifications. We will check labs today.  - Comprehensive metabolic panel  4. Other hyperlipidemia Cardiovascular risk and specific lipid/LDL goals reviewed. We discussed several lifestyle modifications today. Penny Gibson will start her Category 2 plan, and will continue to work on exercise and weight loss efforts. We will check labs today. Orders and follow up as documented in patient record.   - Comprehensive metabolic panel - Lipid Panel With LDL/HDL Ratio  5. Anemia, unspecified type We will check labs today, including folate and B12 and Penny Gibson will follow up as directed. Orders and follow up as documented in patient record.  - CBC with Differential/Platelet - Vitamin B12 - Folate - Anemia panel  6. Vitamin D deficiency Low Vitamin D level contributes to fatigue and are associated with obesity, breast, and colon cancer. We will check labs today. Penny Gibson will follow-up for routine testing of Vitamin D, at least 2-3 times per year to avoid over-replacement.  - VITAMIN D 25 Hydroxy (Vit-D Deficiency, Fractures)  7. Depression screening Penny Gibson had a positive depression screening. Depression is commonly associated with obesity and often results in emotional eating behaviors. We will monitor this closely and work on CBT to help improve the non-hunger eating patterns. Referral to Psychology may be required if no improvement is seen as she continues in our clinic.  8. Class 3 severe obesity with serious comorbidity and body mass index (BMI) of 40.0 to 44.9 in adult, unspecified obesity type Penny Gibson) Penny Gibson is currently in the action stage of change and her goal is to continue with weight loss efforts. I recommend Penny Gibson begin the structured treatment plan as follows:  She has agreed to the Category 2 Plan + 100 calories.  Exercise goals: No exercise has been prescribed for now, while we concentrate on  nutritional changes.   Behavioral modification strategies: meal planning and cooking strategies.  She was informed of the importance of frequent follow-up visits to maximize her success with intensive lifestyle modifications for her multiple health conditions. She was informed we would discuss her lab results at her next visit unless there is a critical issue that needs to be addressed sooner. Penny Gibson agreed to keep her next visit at the agreed upon time to discuss these results.  Objective:   Blood pressure 110/76, pulse 96, temperature 97.9 F (36.6 C), temperature source Oral, height 5\' 1"  (1.549 m), weight 226 lb (102.5 kg), SpO2 95 %. Body mass index is 42.7 kg/m.  EKG: Normal sinus rhythm, rate 96 BPM.  Indirect Calorimeter completed today shows a VO2 of 251 and a REE of 1744.  Her calculated basal metabolic rate is 5397 thus her basal metabolic rate is better than expected.  General: Cooperative, alert, well developed, in no acute distress. HEENT: Conjunctivae and lids unremarkable. Cardiovascular: Regular rhythm.  Lungs: Normal work of breathing. Neurologic: No focal deficits.   Lab Results  Component Value Date   CREATININE 0.94 12/28/2019   BUN 15 12/28/2019   NA 137 12/28/2019   K 4.4 12/28/2019   CL 97 12/28/2019   CO2 24 12/28/2019   Lab Results  Component Value Date   ALT 14 12/28/2019   AST 15 12/28/2019   ALKPHOS 103 12/28/2019   BILITOT 0.3 12/28/2019   Lab Results  Component Value Date   HGBA1C 6.5 (H) 12/28/2019   HGBA1C 5.7 (H) 08/08/2017   Lab Results  Component Value Date   INSULIN 61.4 (H) 12/28/2019   Lab Results  Component Value Date   TSH 1.750 12/28/2019   Lab Results  Component Value Date   CHOL 227 (H) 12/28/2019   HDL 50 12/28/2019   LDLCALC 152 (H) 12/28/2019   TRIG 138 12/28/2019   CHOLHDL 4 03/13/2018   Lab Results  Component Value Date   WBC 10.8 12/28/2019   HGB 11.6 12/28/2019   HCT 35.8 12/28/2019   MCV 85 12/28/2019    PLT 321 12/28/2019   Lab Results  Component Value Date   IRON 44 12/28/2019   TIBC 319 12/28/2019   FERRITIN 66 12/28/2019   Obesity Behavioral Intervention Visit Documentation for Insurance:   Approximately 15 minutes were spent on the discussion below.  ASK: We discussed the diagnosis of obesity with Penny Gibson today and Aily agreed to give [place] permission to discuss obesity behavioral modification therapy today.  ASSESS: Jezlyn has the diagnosis of obesity and her BMI today is 42.72. Jovonda is in the action stage of change.   ADVISE: Lisette was educated on the multiple health risks of obesity as well as the benefit of weight loss to improve her health. She was advised of the need for long term treatment and the importance of lifestyle modifications to improve her current health and to decrease her risk of future health problems.  AGREE: Multiple dietary modification options and treatment options were discussed and Penny Gibson agreed to follow the recommendations documented in the above note.  ARRANGE: Penny Gibson was educated on the importance of frequent visits to treat obesity as outlined per CMS and USPSTF guidelines and agreed to schedule her next follow up appointment today.  Attestation Statements:   Reviewed by clinician on day of visit: allergies, medications, problem list, medical history, surgical history, family history, social history, and previous encounter notes.   I, Trixie Dredge, am acting as transcriptionist for Dennard Nip, MD.  I have reviewed the above documentation for accuracy and completeness, and I agree with the above. - Dennard Nip, MD

## 2020-01-03 ENCOUNTER — Telehealth (INDEPENDENT_AMBULATORY_CARE_PROVIDER_SITE_OTHER): Payer: Self-pay | Admitting: Family Medicine

## 2020-01-03 NOTE — Telephone Encounter (Signed)
Less frequent BM are normal while losing weight, but they should not be hard or painful. Add some Benefix to your drink 2x per day to start and make sure you are drinking at least 80 oz of water.

## 2020-01-03 NOTE — Telephone Encounter (Signed)
Please advise about constipation

## 2020-01-03 NOTE — Telephone Encounter (Signed)
Please see

## 2020-01-10 ENCOUNTER — Ambulatory Visit (INDEPENDENT_AMBULATORY_CARE_PROVIDER_SITE_OTHER): Payer: Medicare Other | Admitting: Family Medicine

## 2020-01-10 ENCOUNTER — Other Ambulatory Visit: Payer: Self-pay

## 2020-01-10 ENCOUNTER — Encounter (INDEPENDENT_AMBULATORY_CARE_PROVIDER_SITE_OTHER): Payer: Self-pay | Admitting: Family Medicine

## 2020-01-10 ENCOUNTER — Other Ambulatory Visit (INDEPENDENT_AMBULATORY_CARE_PROVIDER_SITE_OTHER): Payer: Self-pay | Admitting: Family Medicine

## 2020-01-10 VITALS — BP 130/85 | HR 89 | Temp 98.5°F | Ht 61.0 in | Wt 223.0 lb

## 2020-01-10 DIAGNOSIS — E785 Hyperlipidemia, unspecified: Secondary | ICD-10-CM | POA: Diagnosis not present

## 2020-01-10 DIAGNOSIS — E1169 Type 2 diabetes mellitus with other specified complication: Secondary | ICD-10-CM

## 2020-01-10 DIAGNOSIS — E559 Vitamin D deficiency, unspecified: Secondary | ICD-10-CM | POA: Diagnosis not present

## 2020-01-10 DIAGNOSIS — Z6841 Body Mass Index (BMI) 40.0 and over, adult: Secondary | ICD-10-CM

## 2020-01-10 MED ORDER — METFORMIN HCL 500 MG PO TABS: 500.0000 mg | ORAL_TABLET | Freq: Every day | ORAL | 0 refills | Status: DC

## 2020-01-10 MED ORDER — VITAMIN D (ERGOCALCIFEROL) 1.25 MG (50000 UNIT) PO CAPS: 50000.0000 [IU] | ORAL_CAPSULE | ORAL | 0 refills | Status: DC

## 2020-01-14 NOTE — Progress Notes (Signed)
Chief Complaint:   OBESITY Penny Gibson is here to discuss her progress with her obesity treatment plan along with follow-up of her obesity related diagnoses. Penny Gibson is on the Category 2 Plan + 100 calories and states she is following her eating plan approximately 80% of the time. Penny Gibson states she is doing chair aerobics with DVD for 45 minutes 2 times per week.  Today's visit was #: 2 Starting weight: 226 lbs Starting date: 12/27/2019 Today's weight: 223 lbs Today's date: 01/10/2020 Total lbs lost to date: 3 Total lbs lost since last in-office visit: 3  Interim History: Penny Gibson has done well with weight loss on her eating plan. Her hunger was mostly controlled and she did well eating most of her food.  Subjective:   1. Vitamin D deficiency Penny Gibson has a new diagnosis of Vit D deficiency. Her Vit D level is low, and she recently started Vit D OTC. I discussed labs with the patient today.  2. Hyperlipidemia associated with type 2 diabetes mellitus ([place]) Penny Gibson has a new diagnosis of hyperlipidemia associated with diabetes mellitus. Her LDL is elevated, and HDL and triglycerides are within normal limits. She denies chest pain and she is not on statin. I discussed labs with the patient today.  3. Type 2 diabetes mellitus with other specified complication, without long-term current use of insulin (HCC) Penny Gibson's A1c is elevated at 6.5, and she notes polyphagia. This is a new diagnosis. I discussed labs with the patient today.  Assessment/Plan:   1. Vitamin D deficiency Low Vitamin D level contributes to fatigue and are associated with obesity, breast, and colon cancer. Penny Gibson agreed to continue taking OTC Vit D3 at 1,000 IU daily, and start prescription Vitamin D 50,000 IU every week with no refills. She will follow-up for routine testing of Vitamin D, at least 2-3 times per year to avoid over-replacement.  - Vitamin D, Ergocalciferol, (DRISDOL) 1.25 MG (50000 UNIT) CAPS capsule; Take  1 capsule (50,000 Units total) by mouth every 7 (seven) days.  Dispense: 4 capsule; Refill: 0  2. Hyperlipidemia associated with type 2 diabetes mellitus ([place]) Cardiovascular risk and specific lipid/LDL goals reviewed. We discussed several lifestyle modifications today. Penny Gibson will continue to work on diet, exercise and weight loss efforts. We will recheck labs in 3 months to see how much improvement we can get with lifestyle changes before considering statins. Orders and follow up as documented in patient record.   3. Type 2 diabetes mellitus with other specified complication, without long-term current use of insulin (HCC) Good blood sugar control is important to decrease the likelihood of diabetic complications such as nephropathy, neuropathy, limb loss, blindness, coronary artery disease, and death. Intensive lifestyle modification including diet, exercise and weight loss are the first line of treatment for diabetes. Penny Gibson agreed to start metformin 500 mg q AM with food, with no refills. She was educated on diabetes mellitus in depth and will continue to follow up as directed.  - metFORMIN (GLUCOPHAGE) 500 MG tablet; Take 1 tablet (500 mg total) by mouth daily with breakfast.  Dispense: 30 tablet; Refill: 0  4. Class 3 severe obesity with serious comorbidity and body mass index (BMI) of 40.0 to 44.9 in adult, unspecified obesity type [facility]) Penny Gibson is currently in the action stage of change. As such, her goal is to continue with weight loss efforts. She has agreed to the Category 2 Plan + 100 calories.   Exercise goals: As is.  Behavioral modification strategies: increasing lean protein intake,  decreasing simple carbohydrates and increasing water intake.  Penny Gibson has agreed to follow-up with our clinic in 2 weeks. She was informed of the importance of frequent follow-up visits to maximize her success with intensive lifestyle modifications for her multiple health conditions.   Objective:    Blood pressure (!) 130/85, pulse 89, temperature 98.5 F (36.9 C), temperature source Oral, height 5\' 1"  (1.549 m), weight (!) 223 lb (101.2 kg), SpO2 96 %. Body mass index is 42.14 kg/m.  General: Cooperative, alert, well developed, in no acute distress. HEENT: Conjunctivae and lids unremarkable. Cardiovascular: Regular rhythm.  Lungs: Normal work of breathing. Neurologic: No focal deficits.   Lab Results  Component Value Date   CREATININE 0.94 12/28/2019   BUN 15 12/28/2019   NA 137 12/28/2019   K 4.4 12/28/2019   CL 97 12/28/2019   CO2 24 12/28/2019   Lab Results  Component Value Date   ALT 14 12/28/2019   AST 15 12/28/2019   ALKPHOS 103 12/28/2019   BILITOT 0.3 12/28/2019   Lab Results  Component Value Date   HGBA1C 6.5 (H) 12/28/2019   HGBA1C 5.7 (H) 08/08/2017   Lab Results  Component Value Date   INSULIN 61.4 (H) 12/28/2019   Lab Results  Component Value Date   TSH 1.750 12/28/2019   Lab Results  Component Value Date   CHOL 227 (H) 12/28/2019   HDL 50 12/28/2019   LDLCALC 152 (H) 12/28/2019   TRIG 138 12/28/2019   CHOLHDL 4 03/13/2018   Lab Results  Component Value Date   WBC 10.8 12/28/2019   HGB 11.6 12/28/2019   HCT 35.8 12/28/2019   MCV 85 12/28/2019   PLT 321 12/28/2019   Lab Results  Component Value Date   IRON 44 12/28/2019   TIBC 319 12/28/2019   FERRITIN 66 12/28/2019   Attestation Statements:   Reviewed by clinician on day of visit: allergies, medications, problem list, medical history, surgical history, family history, social history, and previous encounter notes.  Time spent on visit including pre-visit chart review and post-visit care and charting was 45 minutes.    I, Trixie Dredge, am acting as transcriptionist for Dennard Nip, MD.  I have reviewed the above documentation for accuracy and completeness, and I agree with the above. -  Dennard Nip, MD

## 2020-01-28 ENCOUNTER — Ambulatory Visit (INDEPENDENT_AMBULATORY_CARE_PROVIDER_SITE_OTHER): Payer: Medicare Other | Admitting: Physician Assistant

## 2020-01-28 ENCOUNTER — Encounter (INDEPENDENT_AMBULATORY_CARE_PROVIDER_SITE_OTHER): Payer: Self-pay | Admitting: Physician Assistant

## 2020-01-28 ENCOUNTER — Other Ambulatory Visit: Payer: Self-pay

## 2020-01-28 VITALS — BP 100/64 | HR 85 | Temp 97.7°F | Ht 61.0 in | Wt 214.0 lb

## 2020-01-28 DIAGNOSIS — E1169 Type 2 diabetes mellitus with other specified complication: Secondary | ICD-10-CM

## 2020-01-28 DIAGNOSIS — E559 Vitamin D deficiency, unspecified: Secondary | ICD-10-CM

## 2020-01-28 DIAGNOSIS — E785 Hyperlipidemia, unspecified: Secondary | ICD-10-CM | POA: Diagnosis not present

## 2020-01-28 DIAGNOSIS — Z6841 Body Mass Index (BMI) 40.0 and over, adult: Secondary | ICD-10-CM | POA: Diagnosis not present

## 2020-01-28 MED ORDER — VITAMIN D (ERGOCALCIFEROL) 1.25 MG (50000 UNIT) PO CAPS: 50000.0000 [IU] | ORAL_CAPSULE | ORAL | 0 refills | Status: DC

## 2020-01-29 NOTE — Progress Notes (Signed)
Chief Complaint:   OBESITY Penny Gibson is here to discuss her progress with her obesity treatment plan along with follow-up of her obesity related diagnoses. Penny Gibson is on the Category 2 Plan + 100 calories and states she is following her eating plan approximately 95% of the time. [name] states she is doing chair exercises 45 minutes 1 time per week.  Today's visit was #: 3 Starting weight: 226 lbs Starting date: 12/27/2019 Today's weight: 214 lbs Today's date: 01/28/2020 Total lbs lost to date: 12 Total lbs lost since last in-office visit: 9  Interim History: Penny Gibson continues to do well with weight loss and is following the plan closely. She states that she is missing fried chicken and [employer]. She denies excessive hunger. She reports knee pain and that she needs a knee replacement.  Subjective:   Vitamin D deficiency. Penny Gibson is on prescription Vitamin D supplementation. No nausea, vomiting, or muscle weakness.    Ref. Range 12/28/2019 09:32  Vitamin D, 25-Hydroxy Latest Ref Range: 30.0 - 100.0 ng/mL 29.0 (L)   Hyperlipidemia associated with type 2 diabetes mellitus ([place]). [name] is on metformin.  Lab Results  Component Value Date   HGBA1C 6.5 (H) 12/28/2019   HGBA1C 5.7 (H) 08/08/2017   Lab Results  Component Value Date   LDLCALC 152 (H) 12/28/2019   CREATININE 0.94 12/28/2019   Lab Results  Component Value Date   INSULIN 61.4 (H) 12/28/2019   Assessment/Plan:   Vitamin D deficiency. Low Vitamin D level contributes to fatigue and are associated with obesity, breast, and colon cancer. She was given a refill on her Vitamin D, Ergocalciferol, (DRISDOL) 1.25 MG (50000 UNIT) CAPS capsule every week #4 with 0 refills and will follow-up for routine testing of Vitamin D, at least 2-3 times per year to avoid over-replacement.   Hyperlipidemia associated with type 2 diabetes mellitus ([place]). Good blood sugar control is important to decrease the likelihood of  diabetic complications such as nephropathy, neuropathy, limb loss, blindness, coronary artery disease, and death. Intensive lifestyle modification including diet, exercise and weight loss are the first line of treatment for diabetes. Chelsie will continue her medication as directed.   Class 3 severe obesity with serious comorbidity and body mass index (BMI) of 40.0 to 44.9 in adult, unspecified obesity type ([place]).  Penny Gibson is currently in the action stage of change. As such, her goal is to continue with weight loss efforts. She has agreed to the Category 2 Plan.   Penny Gibson will follow-up with her PCP regarding knee pain and for a blood pressure check.  Exercise goals: Older adults should follow the adult guidelines. When older adults cannot meet the adult guidelines, they should be as physically active as their abilities and conditions will allow.   Behavioral modification strategies: meal planning and cooking strategies and keeping healthy foods in the home.  Penny Gibson has agreed to follow-up with our clinic in 2 weeks. She was informed of the importance of frequent follow-up visits to maximize her success with intensive lifestyle modifications for her multiple health conditions.   Objective:   Blood pressure 100/64, pulse 85, temperature 97.7 F (36.5 C), height 5\' 1"  (1.549 m), weight 214 lb (97.1 kg), SpO2 97 %. Body mass index is 40.43 kg/m.  General: Cooperative, alert, well developed, in no acute distress. HEENT: Conjunctivae and lids unremarkable. Cardiovascular: Regular rhythm.  Lungs: Normal work of breathing. Neurologic: No focal deficits.   Lab Results  Component Value Date   CREATININE 0.94  12/28/2019   BUN 15 12/28/2019   NA 137 12/28/2019   K 4.4 12/28/2019   CL 97 12/28/2019   CO2 24 12/28/2019   Lab Results  Component Value Date   ALT 14 12/28/2019   AST 15 12/28/2019   ALKPHOS 103 12/28/2019   BILITOT 0.3 12/28/2019   Lab Results  Component Value Date    HGBA1C 6.5 (H) 12/28/2019   HGBA1C 5.7 (H) 08/08/2017   Lab Results  Component Value Date   INSULIN 61.4 (H) 12/28/2019   Lab Results  Component Value Date   TSH 1.750 12/28/2019   Lab Results  Component Value Date   CHOL 227 (H) 12/28/2019   HDL 50 12/28/2019   LDLCALC 152 (H) 12/28/2019   TRIG 138 12/28/2019   CHOLHDL 4 03/13/2018   Lab Results  Component Value Date   WBC 10.8 12/28/2019   HGB 11.6 12/28/2019   HCT 35.8 12/28/2019   MCV 85 12/28/2019   PLT 321 12/28/2019   Lab Results  Component Value Date   IRON 44 12/28/2019   TIBC 319 12/28/2019   FERRITIN 66 12/28/2019   Obesity Behavioral Intervention Documentation for Insurance:   Approximately 15 minutes were spent on the discussion below.  ASK: We discussed the diagnosis of obesity with [name] today and [name] agreed to give [place] permission to discuss obesity behavioral modification therapy today.  ASSESS: Penny Gibson has the diagnosis of obesity and her BMI today is 40.5. Penny Gibson is in the action stage of change.   ADVISE: Penny Gibson was educated on the multiple health risks of obesity as well as the benefit of weight loss to improve her health. She was advised of the need for long term treatment and the importance of lifestyle modifications to improve her current health and to decrease her risk of future health problems.  AGREE: Multiple dietary modification options and treatment options were discussed and Penny Gibson agreed to follow the recommendations documented in the above note.  ARRANGE: Penny Gibson was educated on the importance of frequent visits to treat obesity as outlined per CMS and USPSTF guidelines and agreed to schedule her next follow up appointment today.  Attestation Statements:   Reviewed by clinician on day of visit: allergies, medications, problem list, medical history, surgical history, family history, social history, and previous encounter notes.  IMichaelene Song, am acting as  transcriptionist for Abby Potash, PA-C   I have reviewed the above documentation for accuracy and completeness, and I agree with the above. Abby Potash, PA-C

## 2020-02-11 ENCOUNTER — Encounter (INDEPENDENT_AMBULATORY_CARE_PROVIDER_SITE_OTHER): Payer: Self-pay | Admitting: Physician Assistant

## 2020-02-11 ENCOUNTER — Other Ambulatory Visit: Payer: Self-pay

## 2020-02-11 ENCOUNTER — Telehealth: Payer: Self-pay | Admitting: Orthopaedic Surgery

## 2020-02-11 ENCOUNTER — Ambulatory Visit (INDEPENDENT_AMBULATORY_CARE_PROVIDER_SITE_OTHER): Payer: Medicare Other | Admitting: Physician Assistant

## 2020-02-11 VITALS — BP 109/73 | HR 94 | Temp 98.1°F | Ht 61.0 in | Wt 210.0 lb

## 2020-02-11 DIAGNOSIS — Z6839 Body mass index (BMI) 39.0-39.9, adult: Secondary | ICD-10-CM

## 2020-02-11 DIAGNOSIS — E1169 Type 2 diabetes mellitus with other specified complication: Secondary | ICD-10-CM | POA: Diagnosis not present

## 2020-02-11 DIAGNOSIS — E785 Hyperlipidemia, unspecified: Secondary | ICD-10-CM | POA: Diagnosis not present

## 2020-02-11 DIAGNOSIS — I10 Essential (primary) hypertension: Secondary | ICD-10-CM | POA: Diagnosis not present

## 2020-02-11 NOTE — Telephone Encounter (Signed)
We have to see her in office and actually check her BMI before we schedule

## 2020-02-11 NOTE — Progress Notes (Signed)
Chief Complaint:   OBESITY Penny Gibson is here to discuss her progress with her obesity treatment plan along with follow-up of her obesity related diagnoses. Penny Gibson is on the Category 2 Plan + 100 calories and states she is following her eating plan approximately 80% of the time. Penny Gibson states she is doing chair exercises 45 minutes 1 time per week.  Today's visit was #: 4 Starting weight: 226 lbs Starting date: 12/27/2019 Today's weight: 210 lbs Today's date: 02/11/2020 Total lbs lost to date: 16 Total lbs lost since last in-office visit: 4  Interim History: Penny Gibson reports some epigastric discomfort that began prior to starting our program. She denies blood in stool or black stools. She states it is worse with using hot sauce and that TUMS improves it. She continues to do well with weight loss. She is looking for more variation with breakfast and lunch. She states that she will check in with her PCP about this soon.  Subjective:   Essential hypertension. Penny Gibson is on Microzide and lisinopril. Blood pressure is controlled. No  chest pain or headache.   BP Readings from Last 3 Encounters:  02/11/20 109/73  01/28/20 100/64  01/10/20 (!) 130/85   Lab Results  Component Value Date   CREATININE 0.94 12/28/2019   CREATININE 0.71 06/30/2018   CREATININE 0.90 06/27/2018   Hyperlipidemia associated with type 2 diabetes mellitus (Penny Gibson). Penny Gibson is on metformin. No nausea, vomiting, or diarrhea.   Lab Results  Component Value Date   HGBA1C 6.5 (H) 12/28/2019   HGBA1C 5.7 (H) 08/08/2017   Lab Results  Component Value Date   LDLCALC 152 (H) 12/28/2019   CREATININE 0.94 12/28/2019   Lab Results  Component Value Date   INSULIN 61.4 (H) 12/28/2019   Assessment/Plan:   Essential hypertension. Penny Gibson is working on healthy weight loss and exercise to improve blood pressure control. We will watch for signs of hypotension as she continues her lifestyle  modifications. She will continue her medications as directed.   Hyperlipidemia associated with type 2 diabetes mellitus (Penny Gibson). Good blood sugar control is important to decrease the likelihood of diabetic complications such as nephropathy, neuropathy, limb loss, blindness, coronary artery disease, and death. Intensive lifestyle modification including diet, exercise and weight loss are the first line of treatment for diabetes. Penny Gibson will continue her medication as directed.   Class 2 severe obesity with serious comorbidity and body mass index (BMI) of 39.0 to 39.9 in adult, unspecified obesity type (Penny Gibson).  Penny Gibson is currently in the action stage of change. As such, her goal is to continue with weight loss efforts. She has agreed to the Category 2 Plan + 100  calories.   She will follow-up with her PCP regarding epigastric pain.  Exercise goals: Penny Gibson should follow the adult guidelines. When Penny Gibson cannot meet the adult guidelines, they should be as physically active as their abilities and conditions will allow.   Behavioral modification strategies: no skipping meals and meal planning and cooking strategies.  Penny Gibson has agreed to follow-up with our clinic in 2 weeks. She was informed of the importance of frequent follow-up visits to maximize her success with intensive lifestyle modifications for her multiple health conditions.   Objective:   Blood pressure 109/73, pulse 94, temperature 98.1 F (36.7 C), temperature source Oral, height 5\' 1"  (1.549 m), weight 210 lb (95.3 kg), SpO2 100 %. Body mass index is 39.68 kg/m.  General: Cooperative, alert, well developed, in no acute distress. HEENT:  Conjunctivae and lids unremarkable. Cardiovascular: Regular rhythm.  Lungs: Normal work of breathing. Neurologic: No focal deficits.   Lab Results  Component Value Date   CREATININE 0.94 12/28/2019   BUN 15 12/28/2019   NA 137 12/28/2019   K 4.4 12/28/2019   CL 97 12/28/2019    CO2 24 12/28/2019   Lab Results  Component Value Date   ALT 14 12/28/2019   AST 15 12/28/2019   ALKPHOS 103 12/28/2019   BILITOT 0.3 12/28/2019   Lab Results  Component Value Date   HGBA1C 6.5 (H) 12/28/2019   HGBA1C 5.7 (H) 08/08/2017   Lab Results  Component Value Date   INSULIN 61.4 (H) 12/28/2019   Lab Results  Component Value Date   TSH 1.750 12/28/2019   Lab Results  Component Value Date   CHOL 227 (H) 12/28/2019   HDL 50 12/28/2019   LDLCALC 152 (H) 12/28/2019   TRIG 138 12/28/2019   CHOLHDL 4 03/13/2018   Lab Results  Component Value Date   WBC 10.8 12/28/2019   HGB 11.6 12/28/2019   HCT 35.8 12/28/2019   MCV 85 12/28/2019   PLT 321 12/28/2019   Lab Results  Component Value Date   IRON 44 12/28/2019   TIBC 319 12/28/2019   FERRITIN 66 12/28/2019   Obesity Behavioral Intervention Documentation for Insurance:   Approximately 15 minutes were spent on the discussion below.  ASK: We discussed the diagnosis of obesity with Penny Gibson today and Penny Gibson agreed to give [place] permission to discuss obesity behavioral modification therapy today.  ASSESS: Penny Gibson has the diagnosis of obesity and her BMI today is 39.8. [name] is in the action stage of change.   ADVISE: Penny Gibson was educated on the multiple health risks of obesity as well as the benefit of weight loss to improve her health. She was advised of the need for long term treatment and the importance of lifestyle modifications to improve her current health and to decrease her risk of future health problems.  AGREE: Multiple dietary modification options and treatment options were discussed and Penny Gibson agreed to follow the recommendations documented in the above note.  ARRANGE: Penny Gibson was educated on the importance of frequent visits to treat obesity as outlined per CMS and USPSTF guidelines and agreed to schedule her next follow up appointment today.  Attestation Statements:   Reviewed by clinician on day  of visit: allergies, medications, problem list, medical history, surgical history, family history, social history, and previous encounter notes.  Penny Gibson, am acting as transcriptionist for Penny Potash, PA-C   I have reviewed the above documentation for accuracy and completeness, and I agree with the above. Penny Potash, PA-C

## 2020-02-11 NOTE — Telephone Encounter (Signed)
Patient called.   Reporting that she was able to get her weight down to a BMI of 39. Would like to proceed with getting set up for surgery now  Call back: [phone]

## 2020-02-19 ENCOUNTER — Encounter: Payer: Self-pay | Admitting: Orthopaedic Surgery

## 2020-02-19 ENCOUNTER — Ambulatory Visit (INDEPENDENT_AMBULATORY_CARE_PROVIDER_SITE_OTHER): Payer: Medicare Other | Admitting: Orthopaedic Surgery

## 2020-02-19 ENCOUNTER — Telehealth: Payer: Self-pay | Admitting: Orthopaedic Surgery

## 2020-02-19 ENCOUNTER — Ambulatory Visit: Payer: Self-pay

## 2020-02-19 VITALS — Ht 61.0 in | Wt 211.8 lb

## 2020-02-19 DIAGNOSIS — M170 Bilateral primary osteoarthritis of knee: Secondary | ICD-10-CM | POA: Diagnosis not present

## 2020-02-19 DIAGNOSIS — M79674 Pain in right toe(s): Secondary | ICD-10-CM

## 2020-02-19 MED ORDER — TRAMADOL HCL 50 MG PO TABS: 50.0000 mg | ORAL_TABLET | Freq: Three times a day (TID) | ORAL | 0 refills | Status: DC | PRN

## 2020-02-19 NOTE — Telephone Encounter (Signed)
Patient called advised she will be staying with her daughter after she have surgery. Patient also asked if her daughter can get a note from Dr Erlinda Hong for the days she will be out helping her. The number to contact patient is [phone]

## 2020-02-19 NOTE — Progress Notes (Signed)
Office Visit Note   Patient: Shalaina Guardiola           Date of Birth: 02/26/1946           MRN: [id number] Visit Date: 02/19/2020              Requested by: Flossie Buffy, NP [place],  [place] [address] PCP: Flossie Buffy, NP   Assessment & Plan: Visit Diagnoses:  1. Great toe pain, right   2. Bilateral primary osteoarthritis of knee     Plan: Impression is bilateral knee advanced degenerative joint disease and right great toe arthritis flareup from recent injury.  In regards to her knees, we have discussed total knee replacement surgery with the left one being first.  Due to Covid restrictions she will be checking with her daughter to see if she will be able to come help her out so that we can do this at an outpatient setting where she goes home the next day.  She will call us when she speaks to her daughter.  In regards to her toe, she will ice this and elevate it as much as possible.  I will call in tramadol to take as needed.  Follow-up with [place] as needed. Total face to face encounter time was greater than 25 minutes and over half of this time was spent in counseling and/or coordination of care.  Follow-Up Instructions: Return if symptoms worsen or fail to improve.   Orders:  Orders Placed This Encounter  Procedures  . XR Toe Great Right   Meds ordered this encounter  Medications  . traMADol (ULTRAM) 50 MG tablet    Sig: Take 1 tablet (50 mg total) by mouth 3 (three) times daily as needed.    Dispense:  30 tablet    Refill:  0      Procedures: No procedures performed   Clinical Data: No additional findings.   Subjective: Chief Complaint  Patient presents with  . Left Knee - Pain  . Right Knee - Pain  . Right Great Toe - Pain    HPI patient is a pleasant 74 year old female who comes in today with bilateral knee pain left greater than right.  This is been ongoing for several years and has continued to worsen.  The pain  is to the entire aspect of both knees worse with activity.  She is also getting pain at night as well.  She has been taking over-the-counter pain medication without significant relief of symptoms.  She has also had previous cortisone injections which only helped for about a month.  She has been putting off total knee replacement surgery as she has been trying to lose weight to get to a BMI of under 40.0.  Other issue she brings up today is pain to the right great toe after stubbing this 2 nights ago.  She has had increased pain with movement and with ambulation.  Review of Systems as detailed in HPI.  All others reviewed and are negative.   Objective: Vital Signs: Ht 5\' 1"  (1.549 m)   Wt 211 lb 12.8 oz (96.1 kg)   BMI 40.02 kg/m   Physical Exam well-developed well-nourished female no acute distress.  Alert and oriented x3.  Ortho Exam examination of both knees reveals trace effusions.  Range of motion 0 to 100 degrees.  Medial and lateral joint line tenderness.  Ligaments are stable.  She is neurovascular intact distally.  Examination of the right great  toe reveals moderate swelling and erythema.  She has moderate tenderness throughout.  She is neurovascularly intact distally.  Specialty Comments:  No specialty comments available.  Imaging: XR Toe Great Right  Result Date: 02/19/2020 Moderate degenerative changes to the right great toe    PMFS History: Patient Active Problem List   Diagnosis Date Noted  . Cardiomegaly 06/27/2018  . Iron deficiency anemia 07/19/2017  . 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 Asthma, mild intermittent 05/28/2016  . Other seasonal allergic rhinitis 07/26/2014  . Atypical squamous cells of undetermined significance (ASCUS) on Papanicolaou smear of cervix 01/31/2014  . BMI 40.0-44.9, adult ([place]) 11/15/2013  . Essential hypertension 09/19/2013  . Generalized osteoarthritis of multiple sites    Past Medical History:  Diagnosis Date  . Anemia   . Arthritis    . Asthma   . Constipation   . Dyspnea   . Hyperlipidemia   . Hypertension   . Insomnia   . Joint pain   . Knee pain   . Lactose intolerance   . Lower extremity edema   . Multiple food allergies   . Osteoarthritis   . Sinus problem     Family History  Problem Relation Age of Onset  . Heart attack Mother   . Heart disease Mother   . Sudden death Mother   . Obesity Mother   . Cancer Sister   . Breast cancer Sister 92  . Stroke Maternal Grandmother   . Sudden death Father     Past Surgical History:  Procedure Laterality Date  . BREAST BIOPSY Left 2010  . BREAST LUMPECTOMY Left 2010  . TUBAL LIGATION     Social History   Occupational History  . Not on file  Tobacco Use  . Smoking status: Never Smoker  . Smokeless tobacco: Never Used  Vaping Use  . Vaping Use: Never used  Substance and Sexual Activity  . Alcohol use: No  . Drug use: No  . Sexual activity: Never    Birth control/protection: Surgical

## 2020-02-19 NOTE — Telephone Encounter (Signed)
See message. If yes, for how long?

## 2020-02-19 NOTE — Telephone Encounter (Signed)
Up to 6 weeks

## 2020-02-20 ENCOUNTER — Other Ambulatory Visit (INDEPENDENT_AMBULATORY_CARE_PROVIDER_SITE_OTHER): Payer: Self-pay | Admitting: Family Medicine

## 2020-02-20 DIAGNOSIS — E1169 Type 2 diabetes mellitus with other specified complication: Secondary | ICD-10-CM

## 2020-02-26 NOTE — Telephone Encounter (Signed)
IC s/w patient and advised. She states she was already made aware.

## 2020-02-28 ENCOUNTER — Encounter (INDEPENDENT_AMBULATORY_CARE_PROVIDER_SITE_OTHER): Payer: Self-pay

## 2020-02-28 NOTE — Telephone Encounter (Signed)
My chart message sent to pt.

## 2020-02-29 ENCOUNTER — Other Ambulatory Visit: Payer: Self-pay

## 2020-03-03 ENCOUNTER — Ambulatory Visit (INDEPENDENT_AMBULATORY_CARE_PROVIDER_SITE_OTHER): Payer: Medicare Other | Admitting: Physician Assistant

## 2020-03-03 ENCOUNTER — Other Ambulatory Visit: Payer: Self-pay

## 2020-03-03 ENCOUNTER — Other Ambulatory Visit (INDEPENDENT_AMBULATORY_CARE_PROVIDER_SITE_OTHER): Payer: Self-pay | Admitting: Family Medicine

## 2020-03-03 ENCOUNTER — Encounter (INDEPENDENT_AMBULATORY_CARE_PROVIDER_SITE_OTHER): Payer: Self-pay | Admitting: Physician Assistant

## 2020-03-03 ENCOUNTER — Encounter (HOSPITAL_BASED_OUTPATIENT_CLINIC_OR_DEPARTMENT_OTHER): Payer: Self-pay | Admitting: Orthopaedic Surgery

## 2020-03-03 ENCOUNTER — Telehealth (INDEPENDENT_AMBULATORY_CARE_PROVIDER_SITE_OTHER): Payer: Self-pay | Admitting: Physician Assistant

## 2020-03-03 VITALS — BP 95/63 | HR 87 | Temp 98.1°F | Ht 61.0 in | Wt 203.0 lb

## 2020-03-03 DIAGNOSIS — E559 Vitamin D deficiency, unspecified: Secondary | ICD-10-CM

## 2020-03-03 DIAGNOSIS — E1169 Type 2 diabetes mellitus with other specified complication: Secondary | ICD-10-CM | POA: Diagnosis not present

## 2020-03-03 DIAGNOSIS — Z6838 Body mass index (BMI) 38.0-38.9, adult: Secondary | ICD-10-CM

## 2020-03-03 MED ORDER — VITAMIN D (ERGOCALCIFEROL) 1.25 MG (50000 UNIT) PO CAPS: 50000.0000 [IU] | ORAL_CAPSULE | ORAL | 0 refills | Status: DC

## 2020-03-03 NOTE — Telephone Encounter (Signed)
Lenore Manner, This pt was seen today in office. A vit. D refill was sent but not her metformin do you want to add it? Please advise.

## 2020-03-03 NOTE — Telephone Encounter (Signed)
Pt called about a refill on metformin called into [facility] on [address], pt states that she was told she needs refill before having surgery on 03-10-2020 please give pt a call regarding this matter.

## 2020-03-03 NOTE — Progress Notes (Signed)
Chief Complaint:   OBESITY Krystian is here to discuss her progress with her obesity treatment plan along with follow-up of her obesity related diagnoses. Vickii is on the Category 2 Plan + 100 calories and states she is following her eating plan approximately 70% of the time. Madisyn states she is doing 0 minutes 0 times per week.  Today's visit was #: 5 Starting weight: 226 lbs Starting date: 12/27/2019 Today's weight: 203 lbs Today's date: 03/03/2020 Total lbs lost to date: 23 Total lbs lost since last in-office visit: 7  Interim History: Ayaana hurt her right foot recently, and she had an xray done which was normal. She notes her foot is still swollen and she is using a cane. She has not been eating all of the food on her plan. She is having a knee replacement next week.  Subjective:   1. Type 2 diabetes mellitus with other specified complication, without long-term current use of insulin (HCC) Navaya is on metformin and she is tolerating it well. Last A1c was 6.5.  2. Vitamin D deficiency Rainbow is on Vit D weekly, and she is not walking outside.  Assessment/Plan:   1. Type 2 diabetes mellitus with other specified complication, without long-term current use of insulin (HCC) Good blood sugar control is important to decrease the likelihood of diabetic complications such as nephropathy, neuropathy, limb loss, blindness, coronary artery disease, and death. Intensive lifestyle modification including diet, exercise and weight loss are the first line of treatment for diabetes. Marillyn will continue her medications, and will follow up as directed.  2. Vitamin D deficiency Low Vitamin D level contributes to fatigue and are associated with obesity, breast, and colon cancer. We will refill prescription Vitamin D for 1 month. Lilyonna will follow-up for routine testing of Vitamin D, at least 2-3 times per year to avoid over-replacement.  - Vitamin D, Ergocalciferol, (DRISDOL) 1.25 MG  (50000 UNIT) CAPS capsule; Take 1 capsule (50,000 Units total) by mouth every 7 (seven) days.  Dispense: 4 capsule; Refill: 0  3. Class 2 severe obesity with serious comorbidity and body mass index (BMI) of 38.0 to 38.9 in adult, unspecified obesity type [facility]) Avrianna is currently in the action stage of change. As such, her goal is to continue with weight loss efforts. She has agreed to the Category 2 Plan + 100 calories.   Exercise goals: No exercise has been prescribed at this time.  Behavioral modification strategies: increasing lean protein intake and no skipping meals.  Airiel has agreed to follow-up with our clinic after surgery, and she will call for an appointment. She was informed of the importance of frequent follow-up visits to maximize her success with intensive lifestyle modifications for her multiple health conditions.   Objective:   Blood pressure 95/63, pulse 87, temperature 98.1 F (36.7 C), temperature source Oral, height 5\' 1"  (1.549 m), weight 203 lb (92.1 kg), SpO2 95 %. Body mass index is 38.36 kg/m.  General: Cooperative, alert, well developed, in no acute distress. HEENT: Conjunctivae and lids unremarkable. Cardiovascular: Regular rhythm.  Lungs: Normal work of breathing. Neurologic: No focal deficits.   Lab Results  Component Value Date   CREATININE 0.94 12/28/2019   BUN 15 12/28/2019   NA 137 12/28/2019   K 4.4 12/28/2019   CL 97 12/28/2019   CO2 24 12/28/2019   Lab Results  Component Value Date   ALT 14 12/28/2019   AST 15 12/28/2019   ALKPHOS 103 12/28/2019   BILITOT 0.3  12/28/2019   Lab Results  Component Value Date   HGBA1C 6.5 (H) 12/28/2019   HGBA1C 5.7 (H) 08/08/2017   Lab Results  Component Value Date   INSULIN 61.4 (H) 12/28/2019   Lab Results  Component Value Date   TSH 1.750 12/28/2019   Lab Results  Component Value Date   CHOL 227 (H) 12/28/2019   HDL 50 12/28/2019   LDLCALC 152 (H) 12/28/2019   TRIG 138 12/28/2019    CHOLHDL 4 03/13/2018   Lab Results  Component Value Date   WBC 10.8 12/28/2019   HGB 11.6 12/28/2019   HCT 35.8 12/28/2019   MCV 85 12/28/2019   PLT 321 12/28/2019   Lab Results  Component Value Date   IRON 44 12/28/2019   TIBC 319 12/28/2019   FERRITIN 66 12/28/2019    Obesity Behavioral Intervention:   Approximately 15 minutes were spent on the discussion below.  ASK: We discussed the diagnosis of obesity with [name] today and Traniya agreed to give [place] permission to discuss obesity behavioral modification therapy today.  ASSESS: Ijeoma has the diagnosis of obesity and her BMI today is 38.38. Ashley is in the action stage of change.   ADVISE: Concettina was educated on the multiple health risks of obesity as well as the benefit of weight loss to improve her health. She was advised of the need for long term treatment and the importance of lifestyle modifications to improve her current health and to decrease her risk of future health problems.  AGREE: Multiple dietary modification options and treatment options were discussed and Deetra agreed to follow the recommendations documented in the above note.  ARRANGE: Aliz was educated on the importance of frequent visits to treat obesity as outlined per CMS and USPSTF guidelines and agreed to schedule her next follow up appointment today.  Attestation Statements:   Reviewed by clinician on day of visit: allergies, medications, problem list, medical history, surgical history, family history, social history, and previous encounter notes.   Wilhemena Durie, am acting as transcriptionist for [employer], PA-C.  I have reviewed the above documentation for accuracy and completeness, and I agree with the above. Abby Potash, PA-C

## 2020-03-04 ENCOUNTER — Other Ambulatory Visit (INDEPENDENT_AMBULATORY_CARE_PROVIDER_SITE_OTHER): Payer: Self-pay | Admitting: Physician Assistant

## 2020-03-04 DIAGNOSIS — E1169 Type 2 diabetes mellitus with other specified complication: Secondary | ICD-10-CM

## 2020-03-04 MED ORDER — METFORMIN HCL 500 MG PO TABS: 500.0000 mg | ORAL_TABLET | Freq: Every day | ORAL | 0 refills | Status: DC

## 2020-03-04 NOTE — Telephone Encounter (Signed)
Yes please. Thanks. 

## 2020-03-05 ENCOUNTER — Ambulatory Visit (INDEPENDENT_AMBULATORY_CARE_PROVIDER_SITE_OTHER): Payer: Medicare Other

## 2020-03-05 ENCOUNTER — Other Ambulatory Visit: Payer: Self-pay

## 2020-03-05 DIAGNOSIS — G8929 Other chronic pain: Secondary | ICD-10-CM

## 2020-03-05 DIAGNOSIS — M25562 Pain in left knee: Secondary | ICD-10-CM

## 2020-03-05 NOTE — Progress Notes (Signed)
Patient came in for lab draw. 

## 2020-03-05 NOTE — Addendum Note (Signed)
Addended by: Precious Bard on: 03/05/2020 02:42 PM   Modules accepted: Orders

## 2020-03-06 ENCOUNTER — Other Ambulatory Visit (HOSPITAL_COMMUNITY): Payer: Medicare Other

## 2020-03-06 LAB — EXTRA LAV TOP TUBE

## 2020-03-06 LAB — PREALBUMIN: Prealbumin: 25 mg/dL (ref 17–34)

## 2020-03-07 ENCOUNTER — Telehealth: Payer: Self-pay | Admitting: *Deleted

## 2020-03-07 ENCOUNTER — Other Ambulatory Visit (HOSPITAL_COMMUNITY)
Admission: RE | Admit: 2020-03-07 | Discharge: 2020-03-07 | Disposition: A | Discharge status: Home / Self Care | Payer: Medicare Other | Source: Ambulatory Visit | Attending: Orthopaedic Surgery | Admitting: Orthopaedic Surgery

## 2020-03-07 ENCOUNTER — Encounter (HOSPITAL_BASED_OUTPATIENT_CLINIC_OR_DEPARTMENT_OTHER)
Admission: RE | Admit: 2020-03-07 | Discharge: 2020-03-07 | Disposition: A | Discharge status: Home / Self Care | Payer: Medicare Other | Source: Ambulatory Visit | Attending: Orthopaedic Surgery | Admitting: Orthopaedic Surgery

## 2020-03-07 ENCOUNTER — Encounter (HOSPITAL_COMMUNITY): Admission: RE | Admit: 2020-03-07 | Payer: Medicare Other | Source: Ambulatory Visit

## 2020-03-07 ENCOUNTER — Ambulatory Visit
Admission: RE | Admit: 2020-03-07 | Discharge: 2020-03-07 | Disposition: A | Discharge status: Home / Self Care | Payer: Medicare Other | Source: Ambulatory Visit | Attending: Physician Assistant | Admitting: Physician Assistant

## 2020-03-07 ENCOUNTER — Other Ambulatory Visit: Payer: Self-pay | Admitting: *Deleted

## 2020-03-07 ENCOUNTER — Other Ambulatory Visit: Payer: Self-pay | Admitting: Physician Assistant

## 2020-03-07 ENCOUNTER — Other Ambulatory Visit: Payer: Self-pay

## 2020-03-07 DIAGNOSIS — Z01812 Encounter for preprocedural laboratory examination: Secondary | ICD-10-CM | POA: Insufficient documentation

## 2020-03-07 DIAGNOSIS — M1712 Unilateral primary osteoarthritis, left knee: Secondary | ICD-10-CM

## 2020-03-07 DIAGNOSIS — Z20822 Contact with and (suspected) exposure to covid-19: Secondary | ICD-10-CM | POA: Insufficient documentation

## 2020-03-07 LAB — CBC WITH DIFFERENTIAL/PLATELET
Abs Immature Granulocytes: 0.04 10*3/uL (ref 0.00–0.07)
Basophils Absolute: 0 10*3/uL (ref 0.0–0.1)
Basophils Relative: 0 %
Eosinophils Absolute: 0.1 10*3/uL (ref 0.0–0.5)
Eosinophils Relative: 1 %
HCT: 36.1 % (ref 36.0–46.0)
Hemoglobin: 11.1 g/dL — ABNORMAL LOW (ref 12.0–15.0)
Immature Granulocytes: 0 %
Lymphocytes Relative: 16 %
Lymphs Abs: 1.7 10*3/uL (ref 0.7–4.0)
MCH: 27.4 pg (ref 26.0–34.0)
MCHC: 30.7 g/dL (ref 30.0–36.0)
MCV: 89.1 fL (ref 80.0–100.0)
Monocytes Absolute: 0.7 10*3/uL (ref 0.1–1.0)
Monocytes Relative: 6 %
Neutro Abs: 8.6 10*3/uL — ABNORMAL HIGH (ref 1.7–7.7)
Neutrophils Relative %: 77 %
Platelets: 203 10*3/uL (ref 150–400)
RBC: 4.05 MIL/uL (ref 3.87–5.11)
RDW: 15.9 % — ABNORMAL HIGH (ref 11.5–15.5)
WBC: 11.1 10*3/uL — ABNORMAL HIGH (ref 4.0–10.5)
nRBC: 0 % (ref 0.0–0.2)

## 2020-03-07 LAB — URINALYSIS, ROUTINE W REFLEX MICROSCOPIC
Bilirubin Urine: NEGATIVE
Glucose, UA: NEGATIVE mg/dL
Ketones, ur: NEGATIVE mg/dL
Nitrite: NEGATIVE
Protein, ur: NEGATIVE mg/dL
Specific Gravity, Urine: 1.015 (ref 1.005–1.030)
WBC, UA: >50 WBC/hpf — ABNORMAL HIGH (ref 0–5)
pH: 5 (ref 5.0–8.0)

## 2020-03-07 LAB — COMPREHENSIVE METABOLIC PANEL
ALT: 13 U/L (ref 0–44)
AST: 16 U/L (ref 15–41)
Albumin: 4 g/dL (ref 3.5–5.0)
Alkaline Phosphatase: 77 U/L (ref 38–126)
Anion gap: 12 (ref 5–15)
BUN: 36 mg/dL — ABNORMAL HIGH (ref 8–23)
CO2: 21 mmol/L — ABNORMAL LOW (ref 22–32)
Calcium: 10.2 mg/dL (ref 8.9–10.3)
Chloride: 102 mmol/L (ref 98–111)
Creatinine, Ser: 1.39 mg/dL — ABNORMAL HIGH (ref 0.44–1.00)
GFR calc Af Amer: 44 mL/min — ABNORMAL LOW (ref >60)
GFR calc non Af Amer: 38 mL/min — ABNORMAL LOW (ref >60)
Glucose, Bld: 106 mg/dL — ABNORMAL HIGH (ref 70–99)
Potassium: 5.1 mmol/L (ref 3.5–5.1)
Sodium: 135 mmol/L (ref 135–145)
Total Bilirubin: 0.3 mg/dL (ref 0.3–1.2)
Total Protein: 8.6 g/dL — ABNORMAL HIGH (ref 6.5–8.1)

## 2020-03-07 LAB — SARS CORONAVIRUS 2 (TAT 6-24 HRS): SARS Coronavirus 2: NEGATIVE

## 2020-03-07 LAB — PROTIME-INR
INR: 1.2 (ref 0.8–1.2)
Prothrombin Time: 14.4 seconds (ref 11.4–15.2)

## 2020-03-07 LAB — TYPE AND SCREEN
ABO/RH(D): B POS
Antibody Screen: NEGATIVE

## 2020-03-07 LAB — SURGICAL PCR SCREEN
MRSA, PCR: NEGATIVE
Staphylococcus aureus: NEGATIVE

## 2020-03-07 LAB — APTT: aPTT: 31 seconds (ref 24–36)

## 2020-03-07 MED ORDER — CELECOXIB 200 MG PO CAPS: 200.0000 mg | ORAL_CAPSULE | Freq: Two times a day (BID) | ORAL | 2 refills | Status: DC

## 2020-03-07 MED ORDER — OXYCODONE-ACETAMINOPHEN 5-325 MG PO TABS: 1.0000 | ORAL_TABLET | Freq: Four times a day (QID) | ORAL | 0 refills | Status: DC | PRN

## 2020-03-07 MED ORDER — ASPIRIN EC 81 MG PO TBEC: 81.0000 mg | DELAYED_RELEASE_TABLET | Freq: Two times a day (BID) | ORAL | 0 refills | Status: DC

## 2020-03-07 MED ORDER — METHOCARBAMOL 750 MG PO TABS: 750.0000 mg | ORAL_TABLET | Freq: Three times a day (TID) | ORAL | 0 refills | Status: DC | PRN

## 2020-03-07 MED ORDER — ONDANSETRON HCL 4 MG PO TABS: 4.0000 mg | ORAL_TABLET | Freq: Three times a day (TID) | ORAL | 0 refills | Status: DC | PRN

## 2020-03-07 MED ORDER — DOCUSATE SODIUM 100 MG PO CAPS: 100.0000 mg | ORAL_CAPSULE | Freq: Every day | ORAL | 2 refills | Status: DC | PRN

## 2020-03-07 NOTE — Telephone Encounter (Signed)
RNCM call to patient to discuss her upcoming Left total knee arthroplasty with Dr. Erlinda Hong on 03/10/20 at [facility]. Reviewed all post-op care instructions with patient. She states she has a daughter that will be assisting at her home after discharge. She has a FWW, 3in1 and a cane. [employer] delivered her home CPM today. Anticipate HHPT will be needed after discharge. Choice provided and referral to [facility]. Please contact RNCM for any other needs/questions. Jamse Arn, RN Case Manager [phone].

## 2020-03-09 NOTE — Anesthesia Preprocedure Evaluation (Addendum)
Anesthesia Evaluation  Patient identified by MRN, date of birth, ID band Patient awake    Reviewed: Allergy & Precautions, NPO status , Patient's Chart, lab work & pertinent test results  Airway Mallampati: II  TM Distance: >3 FB Neck ROM: Full    Dental no notable dental hx. (+) Teeth Intact, Dental Advisory Given   Pulmonary asthma ,    Pulmonary exam normal breath sounds clear to auscultation       Cardiovascular hypertension, Normal cardiovascular exam Rhythm:Regular Rate:Normal  1/20 TTE  Left ventricle: The cavity size was normal. Wall thickness was  increased in a pattern of mild LVH. Systolic function was normal.  The estimated ejection fraction was in the range of 60% to 65%.  Wall motion was normal; there were no regional wall motion  abnormalities. Doppler parameters are consistent with both  elevated ventricular end-diastolic filling pressure and elevated  left atrial filling pressure.    Neuro/Psych negative neurological ROS  negative psych ROS   GI/Hepatic negative GI ROS, Neg liver ROS,   Endo/Other  diabetes, Well Controlled, Type 2, Oral Hypoglycemic Agents  Renal/GU negative Renal ROS     Musculoskeletal  (+) Arthritis ,   Abdominal (+) + obese,   Peds  Hematology   Anesthesia Other Findings   Reproductive/Obstetrics                            Anesthesia Physical Anesthesia Plan  ASA: II  Anesthesia Plan: Spinal   Post-op Pain Management:  Regional for Post-op pain   Induction:   PONV Risk Score and Plan: 3 and Treatment may vary due to age or medical condition, Midazolam and Ondansetron  Airway Management Planned: Nasal Cannula and Natural Airway  Additional Equipment:   Intra-op Plan:   Post-operative Plan:   Informed Consent: I have reviewed the patients History and Physical, chart, labs and discussed the procedure including the risks,  benefits and alternatives for the proposed anesthesia with the patient or authorized representative who has indicated his/her understanding and acceptance.     Dental advisory given  Plan Discussed with: CRNA  Anesthesia Plan Comments: (Spinal w L adductor canal)       Anesthesia Quick Evaluation

## 2020-03-10 ENCOUNTER — Observation Stay (HOSPITAL_BASED_OUTPATIENT_CLINIC_OR_DEPARTMENT_OTHER)
Admission: RE | Admit: 2020-03-10 | Discharge: 2020-03-11 | Disposition: A | Discharge status: Home / Self Care | Payer: Medicare Other | Attending: Orthopaedic Surgery | Admitting: Orthopaedic Surgery

## 2020-03-10 ENCOUNTER — Ambulatory Visit (HOSPITAL_BASED_OUTPATIENT_CLINIC_OR_DEPARTMENT_OTHER): Payer: Medicare Other | Admitting: Certified Registered Nurse Anesthetist

## 2020-03-10 ENCOUNTER — Other Ambulatory Visit: Payer: Self-pay

## 2020-03-10 ENCOUNTER — Ambulatory Visit (HOSPITAL_COMMUNITY): Payer: Medicare Other

## 2020-03-10 ENCOUNTER — Encounter (HOSPITAL_BASED_OUTPATIENT_CLINIC_OR_DEPARTMENT_OTHER)
Admission: RE | Disposition: A | Discharge status: Home / Self Care | Payer: Self-pay | Source: Home / Self Care | Attending: Orthopaedic Surgery

## 2020-03-10 ENCOUNTER — Other Ambulatory Visit: Payer: Self-pay | Admitting: Physician Assistant

## 2020-03-10 ENCOUNTER — Encounter (HOSPITAL_BASED_OUTPATIENT_CLINIC_OR_DEPARTMENT_OTHER): Payer: Self-pay | Admitting: Orthopaedic Surgery

## 2020-03-10 DIAGNOSIS — Z7984 Long term (current) use of oral hypoglycemic drugs: Secondary | ICD-10-CM | POA: Diagnosis not present

## 2020-03-10 DIAGNOSIS — Z96652 Presence of left artificial knee joint: Secondary | ICD-10-CM

## 2020-03-10 DIAGNOSIS — I10 Essential (primary) hypertension: Secondary | ICD-10-CM | POA: Insufficient documentation

## 2020-03-10 DIAGNOSIS — M1712 Unilateral primary osteoarthritis, left knee: Secondary | ICD-10-CM | POA: Diagnosis present

## 2020-03-10 DIAGNOSIS — Z79899 Other long term (current) drug therapy: Secondary | ICD-10-CM | POA: Diagnosis not present

## 2020-03-10 DIAGNOSIS — J45909 Unspecified asthma, uncomplicated: Secondary | ICD-10-CM | POA: Diagnosis not present

## 2020-03-10 DIAGNOSIS — Z7982 Long term (current) use of aspirin: Secondary | ICD-10-CM | POA: Insufficient documentation

## 2020-03-10 DIAGNOSIS — E119 Type 2 diabetes mellitus without complications: Secondary | ICD-10-CM | POA: Insufficient documentation

## 2020-03-10 HISTORY — PX: TOTAL KNEE ARTHROPLASTY: SHX125

## 2020-03-10 HISTORY — DX: Type 2 diabetes mellitus without complications: E11.9

## 2020-03-10 LAB — GLUCOSE, CAPILLARY
Glucose-Capillary: 103 mg/dL — ABNORMAL HIGH (ref 70–99)
Glucose-Capillary: 115 mg/dL — ABNORMAL HIGH (ref 70–99)
Glucose-Capillary: 101 mg/dL — ABNORMAL HIGH (ref 70–99)
Glucose-Capillary: 129 mg/dL — ABNORMAL HIGH (ref 70–99)

## 2020-03-10 SURGERY — ARTHROPLASTY, KNEE, TOTAL
Anesthesia: Spinal | Site: Knee | Laterality: Left

## 2020-03-10 MED ORDER — VANCOMYCIN HCL IN DEXTROSE 1-5 GM/200ML-% IV SOLN
1000.0000 mg | INTRAVENOUS | Status: AC
  Administered 2020-03-10: 1000 mg via INTRAVENOUS

## 2020-03-10 MED ORDER — HYDROMORPHONE HCL 1 MG/ML IJ SOLN: 0.5000 mg | INTRAMUSCULAR | Status: DC | PRN

## 2020-03-10 MED ORDER — FENTANYL CITRATE (PF) 100 MCG/2ML IJ SOLN
INTRAMUSCULAR | Status: AC
  Filled 2020-03-10: qty 2

## 2020-03-10 MED ORDER — LISINOPRIL 40 MG PO TABS
40.0000 mg | ORAL_TABLET | Freq: Every day | ORAL | Status: DC
  Filled 2020-03-10 (×2): qty 1

## 2020-03-10 MED ORDER — VANCOMYCIN HCL 1000 MG IV SOLR
INTRAVENOUS | Status: AC
  Filled 2020-03-10: qty 2000

## 2020-03-10 MED ORDER — EPHEDRINE SULFATE-NACL 50-0.9 MG/10ML-% IV SOSY
PREFILLED_SYRINGE | INTRAVENOUS | Status: DC | PRN
  Administered 2020-03-10: 10 mg via INTRAVENOUS
  Administered 2020-03-10: 5 mg via INTRAVENOUS
  Administered 2020-03-10 (×3): 10 mg via INTRAVENOUS

## 2020-03-10 MED ORDER — ACETAMINOPHEN 325 MG PO TABS: 325.0000 mg | ORAL_TABLET | Freq: Four times a day (QID) | ORAL | Status: DC | PRN

## 2020-03-10 MED ORDER — ALUM & MAG HYDROXIDE-SIMETH 200-200-20 MG/5ML PO SUSP: 30.0000 mL | ORAL | Status: DC | PRN

## 2020-03-10 MED ORDER — PHENOL 1.4 % MT LIQD: 1.0000 | OROMUCOSAL | Status: DC | PRN

## 2020-03-10 MED ORDER — TRANEXAMIC ACID 1000 MG/10ML IV SOLN: 2000.0000 mg | INTRAVENOUS | Status: DC

## 2020-03-10 MED ORDER — VANCOMYCIN HCL 1000 MG IV SOLR
INTRAVENOUS | Status: DC | PRN
  Administered 2020-03-10: 1000 mg

## 2020-03-10 MED ORDER — ONDANSETRON HCL 4 MG/2ML IJ SOLN: 4.0000 mg | Freq: Once | INTRAMUSCULAR | Status: DC | PRN

## 2020-03-10 MED ORDER — PROPOFOL 500 MG/50ML IV EMUL
INTRAVENOUS | Status: AC
  Filled 2020-03-10: qty 50

## 2020-03-10 MED ORDER — LIDOCAINE HCL (CARDIAC) PF 100 MG/5ML IV SOSY
PREFILLED_SYRINGE | INTRAVENOUS | Status: DC | PRN
  Administered 2020-03-10: 20 mg via INTRAVENOUS

## 2020-03-10 MED ORDER — TRANEXAMIC ACID-NACL 1000-0.7 MG/100ML-% IV SOLN
INTRAVENOUS | Status: AC
  Filled 2020-03-10: qty 100

## 2020-03-10 MED ORDER — PROPOFOL 10 MG/ML IV BOLUS
INTRAVENOUS | Status: AC
  Filled 2020-03-10: qty 20

## 2020-03-10 MED ORDER — CLONIDINE HCL (ANALGESIA) 100 MCG/ML EP SOLN
EPIDURAL | Status: DC | PRN
  Administered 2020-03-10: 100 ug

## 2020-03-10 MED ORDER — FENTANYL CITRATE (PF) 100 MCG/2ML IJ SOLN
50.0000 ug | Freq: Once | INTRAMUSCULAR | Status: AC
  Administered 2020-03-10: 50 ug via INTRAVENOUS

## 2020-03-10 MED ORDER — PHENYLEPHRINE 40 MCG/ML (10ML) SYRINGE FOR IV PUSH (FOR BLOOD PRESSURE SUPPORT)
PREFILLED_SYRINGE | INTRAVENOUS | Status: DC | PRN
  Administered 2020-03-10 (×2): 80 ug via INTRAVENOUS

## 2020-03-10 MED ORDER — ONDANSETRON HCL 4 MG/2ML IJ SOLN
INTRAMUSCULAR | Status: DC | PRN
  Administered 2020-03-10: 4 mg via INTRAVENOUS

## 2020-03-10 MED ORDER — SULFAMETHOXAZOLE-TRIMETHOPRIM 800-160 MG PO TABS
1.0000 | ORAL_TABLET | Freq: Two times a day (BID) | ORAL | Status: DC
  Administered 2020-03-10: 1 via ORAL
  Filled 2020-03-10: qty 1

## 2020-03-10 MED ORDER — IRRISEPT - 450ML BOTTLE WITH 0.05% CHG IN STERILE WATER, USP 99.95% OPTIME
TOPICAL | Status: DC | PRN
  Administered 2020-03-10: 200 mL via TOPICAL

## 2020-03-10 MED ORDER — DIPHENHYDRAMINE HCL 12.5 MG/5ML PO ELIX: 25.0000 mg | ORAL_SOLUTION | ORAL | Status: DC | PRN

## 2020-03-10 MED ORDER — ALBUTEROL SULFATE HFA 108 (90 BASE) MCG/ACT IN AERS: 1.0000 | INHALATION_SPRAY | Freq: Four times a day (QID) | RESPIRATORY_TRACT | Status: DC | PRN

## 2020-03-10 MED ORDER — PROPOFOL 500 MG/50ML IV EMUL
INTRAVENOUS | Status: DC | PRN
  Administered 2020-03-10: 70 ug/kg/min via INTRAVENOUS

## 2020-03-10 MED ORDER — DEXAMETHASONE SODIUM PHOSPHATE 10 MG/ML IJ SOLN: 10.0000 mg | Freq: Once | INTRAMUSCULAR | Status: DC

## 2020-03-10 MED ORDER — DOCUSATE SODIUM 100 MG PO CAPS
100.0000 mg | ORAL_CAPSULE | Freq: Two times a day (BID) | ORAL | Status: DC
  Administered 2020-03-10: 100 mg via ORAL
  Filled 2020-03-10: qty 1

## 2020-03-10 MED ORDER — FENTANYL CITRATE (PF) 100 MCG/2ML IJ SOLN: 25.0000 ug | INTRAMUSCULAR | Status: DC | PRN

## 2020-03-10 MED ORDER — METOCLOPRAMIDE HCL 5 MG PO TABS: 5.0000 mg | ORAL_TABLET | Freq: Three times a day (TID) | ORAL | Status: DC | PRN

## 2020-03-10 MED ORDER — TRANEXAMIC ACID 1000 MG/10ML IV SOLN
INTRAVENOUS | Status: DC | PRN
  Administered 2020-03-10: 2000 mg via TOPICAL

## 2020-03-10 MED ORDER — MENTHOL 3 MG MT LOZG: 1.0000 | LOZENGE | OROMUCOSAL | Status: DC | PRN

## 2020-03-10 MED ORDER — POVIDONE-IODINE 10 % EX SWAB: 2.0000 "application " | Freq: Once | CUTANEOUS | Status: DC

## 2020-03-10 MED ORDER — INSULIN ASPART 100 UNIT/ML ~~LOC~~ SOLN: 0.0000 [IU] | Freq: Every day | SUBCUTANEOUS | Status: DC

## 2020-03-10 MED ORDER — VANCOMYCIN HCL 1000 MG IV SOLR
INTRAVENOUS | Status: AC
  Filled 2020-03-10: qty 1000

## 2020-03-10 MED ORDER — ONDANSETRON HCL 4 MG/2ML IJ SOLN
4.0000 mg | Freq: Four times a day (QID) | INTRAMUSCULAR | Status: DC | PRN
  Administered 2020-03-10: 4 mg via INTRAVENOUS
  Filled 2020-03-10: qty 2

## 2020-03-10 MED ORDER — POLYETHYLENE GLYCOL 3350 17 G PO PACK: 17.0000 g | PACK | Freq: Every day | ORAL | Status: DC | PRN

## 2020-03-10 MED ORDER — ASPIRIN 81 MG PO CHEW
81.0000 mg | CHEWABLE_TABLET | Freq: Two times a day (BID) | ORAL | Status: DC
  Administered 2020-03-10: 81 mg via ORAL
  Filled 2020-03-10: qty 1

## 2020-03-10 MED ORDER — METOCLOPRAMIDE HCL 5 MG/ML IJ SOLN: 5.0000 mg | Freq: Three times a day (TID) | INTRAMUSCULAR | Status: DC | PRN

## 2020-03-10 MED ORDER — TRANEXAMIC ACID-NACL 1000-0.7 MG/100ML-% IV SOLN
1000.0000 mg | INTRAVENOUS | Status: AC
  Administered 2020-03-10: 1000 mg via INTRAVENOUS

## 2020-03-10 MED ORDER — ONDANSETRON HCL 4 MG PO TABS: 4.0000 mg | ORAL_TABLET | Freq: Four times a day (QID) | ORAL | Status: DC | PRN

## 2020-03-10 MED ORDER — ACETAMINOPHEN 10 MG/ML IV SOLN: 1000.0000 mg | Freq: Once | INTRAVENOUS | Status: DC | PRN

## 2020-03-10 MED ORDER — PROPOFOL 10 MG/ML IV BOLUS
INTRAVENOUS | Status: AC
  Filled 2020-03-10: qty 60

## 2020-03-10 MED ORDER — LACTATED RINGERS IV SOLN: INTRAVENOUS | Status: DC

## 2020-03-10 MED ORDER — VANCOMYCIN HCL IN DEXTROSE 1-5 GM/200ML-% IV SOLN
INTRAVENOUS | Status: AC
  Filled 2020-03-10: qty 200

## 2020-03-10 MED ORDER — SULFAMETHOXAZOLE-TRIMETHOPRIM 800-160 MG PO TABS: 1.0000 | ORAL_TABLET | Freq: Two times a day (BID) | ORAL | 0 refills | Status: DC

## 2020-03-10 MED ORDER — BUPIVACAINE LIPOSOME 1.3 % IJ SUSP
INTRAMUSCULAR | Status: DC | PRN
  Administered 2020-03-10: 20 mL

## 2020-03-10 MED ORDER — BUPIVACAINE-EPINEPHRINE 0.25% -1:200000 IJ SOLN
INTRAMUSCULAR | Status: DC | PRN
  Administered 2020-03-10: 20 mL

## 2020-03-10 MED ORDER — ACETAMINOPHEN 500 MG PO TABS
1000.0000 mg | ORAL_TABLET | Freq: Four times a day (QID) | ORAL | Status: DC
  Administered 2020-03-10 (×2): 1000 mg via ORAL
  Filled 2020-03-10 (×2): qty 2

## 2020-03-10 MED ORDER — PROPOFOL 10 MG/ML IV BOLUS
INTRAVENOUS | Status: DC | PRN
  Administered 2020-03-10 (×2): 20 mg via INTRAVENOUS

## 2020-03-10 MED ORDER — MAGNESIUM CITRATE PO SOLN: 1.0000 | Freq: Once | ORAL | Status: DC | PRN

## 2020-03-10 MED ORDER — LIDOCAINE 2% (20 MG/ML) 5 ML SYRINGE
INTRAMUSCULAR | Status: AC
  Filled 2020-03-10: qty 5

## 2020-03-10 MED ORDER — SODIUM CHLORIDE 0.9 % IV SOLN: INTRAVENOUS | Status: DC

## 2020-03-10 MED ORDER — ROPIVACAINE HCL 7.5 MG/ML IJ SOLN
INTRAMUSCULAR | Status: DC | PRN
  Administered 2020-03-10: 20 mL via PERINEURAL

## 2020-03-10 MED ORDER — OXYCODONE HCL 5 MG PO TABS
5.0000 mg | ORAL_TABLET | ORAL | Status: DC | PRN
  Administered 2020-03-10 – 2020-03-11 (×4): 10 mg via ORAL
  Filled 2020-03-10 (×4): qty 2

## 2020-03-10 MED ORDER — PHENYLEPHRINE HCL-NACL 10-0.9 MG/250ML-% IV SOLN
INTRAVENOUS | Status: DC | PRN
  Administered 2020-03-10: 40 ug/min via INTRAVENOUS

## 2020-03-10 MED ORDER — BUPIVACAINE IN DEXTROSE 0.75-8.25 % IT SOLN
INTRATHECAL | Status: DC | PRN
  Administered 2020-03-10: 11.25 mg via INTRATHECAL

## 2020-03-10 MED ORDER — OXYCODONE HCL 5 MG PO TABS: 10.0000 mg | ORAL_TABLET | ORAL | Status: DC | PRN

## 2020-03-10 MED ORDER — VANCOMYCIN HCL 1250 MG/250ML IV SOLN
1250.0000 mg | Freq: Two times a day (BID) | INTRAVENOUS | Status: DC
  Administered 2020-03-11: 1250 mg via INTRAVENOUS
  Filled 2020-03-10: qty 250

## 2020-03-10 MED ORDER — EPHEDRINE 5 MG/ML INJ
INTRAVENOUS | Status: AC
  Filled 2020-03-10: qty 10

## 2020-03-10 MED ORDER — SORBITOL 70 % SOLN: 30.0000 mL | Freq: Every day | Status: DC | PRN

## 2020-03-10 MED ORDER — OXYCODONE HCL ER 10 MG PO T12A
10.0000 mg | EXTENDED_RELEASE_TABLET | Freq: Two times a day (BID) | ORAL | Status: DC
  Administered 2020-03-10: 10 mg via ORAL
  Filled 2020-03-10: qty 1

## 2020-03-10 MED ORDER — MIDAZOLAM HCL 2 MG/2ML IJ SOLN
INTRAMUSCULAR | Status: AC
  Filled 2020-03-10: qty 2

## 2020-03-10 MED ORDER — INSULIN ASPART 100 UNIT/ML ~~LOC~~ SOLN: 0.0000 [IU] | Freq: Three times a day (TID) | SUBCUTANEOUS | Status: DC

## 2020-03-10 MED ORDER — BUPIVACAINE LIPOSOME 1.3 % IJ SUSP: 20.0000 mL | Freq: Once | INTRAMUSCULAR | Status: DC

## 2020-03-10 MED ORDER — HYDROCHLOROTHIAZIDE 12.5 MG PO CAPS
12.5000 mg | ORAL_CAPSULE | Freq: Every day | ORAL | Status: DC
  Filled 2020-03-10 (×2): qty 1

## 2020-03-10 SURGICAL SUPPLY — 102 items
ADH SKN CLS APL DERMABOND .7 (GAUZE/BANDAGES/DRESSINGS) ×1
ALCOHOL 70% 16 OZ (MISCELLANEOUS) ×3 IMPLANT
BAG DECANTER FOR FLEXI CONT (MISCELLANEOUS) ×3 IMPLANT
BANDAGE ESMARK 6X9 LF (GAUZE/BANDAGES/DRESSINGS) ×1 IMPLANT
BLADE HEX COATED 2.75 (ELECTRODE) ×3 IMPLANT
BLADE SAG 18X100X1.27 (BLADE) ×3 IMPLANT
BNDG CMPR 9X6 STRL LF SNTH (GAUZE/BANDAGES/DRESSINGS)
BNDG CMPR MED 10X6 ELC LF (GAUZE/BANDAGES/DRESSINGS)
BNDG ELASTIC 6X10 VLCR STRL LF (GAUZE/BANDAGES/DRESSINGS) IMPLANT
BNDG ESMARK 6X9 LF (GAUZE/BANDAGES/DRESSINGS)
BOWL SMART MIX CTS (DISPOSABLE) ×3 IMPLANT
BSPLAT TIB 5D D CMNT STM LT (Knees) ×1 IMPLANT
CEMENT BONE REFOBACIN R1X40 US (Cement) ×6 IMPLANT
CLOSURE STERI-STRIP 1/2X4 (GAUZE/BANDAGES/DRESSINGS)
CLSR STERI-STRIP ANTIMIC 1/2X4 (GAUZE/BANDAGES/DRESSINGS) IMPLANT
COMP MED POLY AS PERS S6-7 12 (Joint) ×3 IMPLANT
COMPONENT MED PLY PERSS6-7 12 (Joint) IMPLANT
COOLER ICEMAN CLASSIC (MISCELLANEOUS) ×2 IMPLANT
COVER SURGICAL LIGHT HANDLE (MISCELLANEOUS) ×3 IMPLANT
COVER WAND RF STERILE (DRAPES) ×3 IMPLANT
CUFF TOURN SGL QUICK 34 (TOURNIQUET CUFF) ×3
CUFF TOURN SGL QUICK 42 (TOURNIQUET CUFF) IMPLANT
CUFF TRNQT CYL 34X4.125X (TOURNIQUET CUFF) ×1 IMPLANT
DERMABOND ADVANCED (GAUZE/BANDAGES/DRESSINGS) ×2
DERMABOND ADVANCED .7 DNX12 (GAUZE/BANDAGES/DRESSINGS) IMPLANT
DRAPE EXTREMITY T 121X128X90 (DISPOSABLE) ×3 IMPLANT
DRAPE INCISE IOBAN 66X45 STRL (DRAPES) ×3 IMPLANT
DRAPE POUCH INSTRU U-SHP 10X18 (DRAPES) IMPLANT
DRAPE TOP ARMCOVERS (MISCELLANEOUS) ×3 IMPLANT
DRAPE U-SHAPE 47X51 STRL (DRAPES) ×3 IMPLANT
DRAPE U-SHAPE 76X120 STRL (DRAPES) ×6 IMPLANT
DRSG AQUACEL AG ADV 3.5X 6 (GAUZE/BANDAGES/DRESSINGS) IMPLANT
DRSG AQUACEL AG ADV 3.5X10 (GAUZE/BANDAGES/DRESSINGS) ×2 IMPLANT
DRSG PAD ABDOMINAL 8X10 ST (GAUZE/BANDAGES/DRESSINGS) IMPLANT
DURAPREP 26ML APPLICATOR (WOUND CARE) ×9 IMPLANT
ELECT REM PT RETURN 9FT ADLT (ELECTROSURGICAL) ×3
ELECTRODE REM PT RTRN 9FT ADLT (ELECTROSURGICAL) ×1 IMPLANT
GAUZE SPONGE 4X4 12PLY STRL (GAUZE/BANDAGES/DRESSINGS) ×3 IMPLANT
GLOVE BIO SURGEON STRL SZ 6.5 (GLOVE) ×1 IMPLANT
GLOVE BIO SURGEONS STRL SZ 6.5 (GLOVE) ×1
GLOVE BIOGEL PI IND STRL 6.5 (GLOVE) IMPLANT
GLOVE BIOGEL PI IND STRL 7.0 (GLOVE) ×1 IMPLANT
GLOVE BIOGEL PI INDICATOR 6.5 (GLOVE) ×2
GLOVE BIOGEL PI INDICATOR 7.0 (GLOVE) ×2
GLOVE ECLIPSE 7.0 STRL STRAW (GLOVE) ×7 IMPLANT
GLOVE SKINSENSE NS SZ7.5 (GLOVE) ×6
GLOVE SKINSENSE STRL SZ7.5 (GLOVE) ×3 IMPLANT
GLOVE SURG SS PI 7.5 STRL IVOR (GLOVE) ×1 IMPLANT
GLOVE SURG SYN 7.5  E (GLOVE) ×9
GLOVE SURG SYN 7.5 E (GLOVE) ×3 IMPLANT
GLOVE SURG SYN 7.5 PF PI (GLOVE) ×4 IMPLANT
GOWN STRL REIN XL XLG (GOWN DISPOSABLE) ×3 IMPLANT
GOWN STRL REUS W/ TWL LRG LVL3 (GOWN DISPOSABLE) ×1 IMPLANT
GOWN STRL REUS W/ TWL XL LVL3 (GOWN DISPOSABLE) ×1 IMPLANT
GOWN STRL REUS W/TWL LRG LVL3 (GOWN DISPOSABLE) ×3
GOWN STRL REUS W/TWL XL LVL3 (GOWN DISPOSABLE) ×3
HANDPIECE INTERPULSE COAX TIP (DISPOSABLE) ×3
HDLS TROCR DRIL PIN KNEE 75 (PIN) ×6
HOOD PEEL AWAY FLYTE STAYCOOL (MISCELLANEOUS) ×6 IMPLANT
INSERT ARTISURF SZ 6-7 LT (Insert) ×2 IMPLANT
KIT TURNOVER KIT B (KITS) ×3 IMPLANT
KNEE SYSTEM FEMUR SZ 6 LT (Knees) ×2 IMPLANT
MANIFOLD NEPTUNE II (INSTRUMENTS) ×3 IMPLANT
MARKER SKIN DUAL TIP RULER LAB (MISCELLANEOUS) ×3 IMPLANT
NDL SPNL 18GX3.5 QUINCKE PK (NEEDLE) ×2 IMPLANT
NDL SPNL 22GX3.5 QUINCKE BK (NEEDLE) IMPLANT
NDL SPNL 25GX3.5 QUINCKE BL (NEEDLE) IMPLANT
NEEDLE SPNL 18GX3.5 QUINCKE PK (NEEDLE) ×3 IMPLANT
NEEDLE SPNL 22GX3.5 QUINCKE BK (NEEDLE) IMPLANT
NEEDLE SPNL 25GX3.5 QUINCKE BL (NEEDLE) ×3 IMPLANT
NS IRRIG 1000ML POUR BTL (IV SOLUTION) ×3 IMPLANT
PACK BASIN DAY SURGERY FS (CUSTOM PROCEDURE TRAY) ×3 IMPLANT
PACK ICE MAXI GEL EZY WRAP (MISCELLANEOUS) ×1 IMPLANT
PACK TOTAL JOINT (CUSTOM PROCEDURE TRAY) ×3 IMPLANT
PAD CAST 4YDX4 CTTN HI CHSV (CAST SUPPLIES) ×1 IMPLANT
PAD COLD SHLDR WRAP-ON (PAD) ×2 IMPLANT
PADDING CAST COTTON 4X4 STRL (CAST SUPPLIES) ×3
PILLOW KNEE EXTENSION 0 DEG (MISCELLANEOUS) ×2 IMPLANT
PIN DRILL HDLS TROCAR 75 4PK (PIN) IMPLANT
SAW OSC TIP CART 19.5X105X1.3 (SAW) ×3 IMPLANT
SCREW FEMALE HEX FIX 25X2.5 (ORTHOPEDIC DISPOSABLE SUPPLIES) ×2 IMPLANT
SET HNDPC FAN SPRY TIP SCT (DISPOSABLE) ×1 IMPLANT
SHEET MEDIUM DRAPE 40X70 STRL (DRAPES) ×3 IMPLANT
STAPLER VISISTAT 35W (STAPLE) IMPLANT
STEM POLY PAT PLY 29M KNEE (Knees) ×2 IMPLANT
STEM TIBIA 5 DEG SZ D L KNEE (Knees) IMPLANT
SUCTION FRAZIER HANDLE 10FR (MISCELLANEOUS) ×3
SUCTION TUBE FRAZIER 10FR DISP (MISCELLANEOUS) ×1 IMPLANT
SUT ETHILON 2 0 FS 18 (SUTURE) ×10 IMPLANT
SUT MNCRL AB 3-0 PS2 27 (SUTURE) IMPLANT
SUT VIC AB 0 CT1 27 (SUTURE) ×6
SUT VIC AB 0 CT1 27XBRD ANBCTR (SUTURE) ×2 IMPLANT
SUT VIC AB 1 CTX 27 (SUTURE) ×9 IMPLANT
SUT VIC AB 2-0 CT1 27 (SUTURE) ×12
SUT VIC AB 2-0 CT1 TAPERPNT 27 (SUTURE) ×2 IMPLANT
SYR 50ML LL SCALE MARK (SYRINGE) ×3 IMPLANT
TIBIA STEM 5 DEG SZ D L KNEE (Knees) ×3 IMPLANT
TOWEL GREEN STERILE FF (TOWEL DISPOSABLE) ×12 IMPLANT
TRAY FOL W/BAG SLVR 16FR STRL (SET/KITS/TRAYS/PACK) IMPLANT
TRAY FOLEY W/BAG SLVR 14FR LF (SET/KITS/TRAYS/PACK) ×2 IMPLANT
TRAY FOLEY W/BAG SLVR 16FR LF (SET/KITS/TRAYS/PACK)
TRAY SPINAL 24X4 BUPIV (SET/KITS/TRAYS/PACK) ×3 IMPLANT

## 2020-03-10 NOTE — Progress Notes (Signed)
RNCM called patient and discussed her Penny Gibson with Dr. Erlinda Hong on 03/10/20 at [facility]. Reviewed all post-op care instructions with patient. She states she has a daughter that will be assisting at her home after discharge. She has a FWW, 3in1 and a cane. [employer] delivered her home CPM on Friday, 03/07/20. Anticipate HHPT will be needed after discharge. Choice provided and referral to [facility]. Please contact RNCM for any other needs/questions. Jamse Arn, RN Case Manager [phone].

## 2020-03-10 NOTE — Transfer of Care (Signed)
Immediate Anesthesia Transfer of Care Note  Patient: Penny Gibson  Procedure(s) Performed: LEFT TOTAL KNEE ARTHROPLASTY (Left Knee)  Patient Location: PACU  Anesthesia Type:Spinal  Level of Consciousness: drowsy  Airway & Oxygen Therapy: Patient Spontanous Breathing and Patient connected to face mask oxygen  Post-op Assessment: Report given to RN and Post -op Vital signs reviewed and stable  Post vital signs: Reviewed and stable  Last Vitals:  Vitals Value Taken Time  BP 100/52 03/10/20 1513  Temp    Pulse 94 03/10/20 1513  Resp 39 03/10/20 1514  SpO2 100 % 03/10/20 1513  Vitals shown include unvalidated device data.  Last Pain:  Vitals:   03/10/20 1032  TempSrc: Oral  PainSc: 2       Patients Stated Pain Goal: 5 (30/05/11 0211)  Complications: No complications documented.

## 2020-03-10 NOTE — Anesthesia Procedure Notes (Signed)
Spinal  Patient location during procedure: OB Start time: 03/10/2020 1:00 PM End time: 03/10/2020 1:05 PM Staffing Performed: anesthesiologist  Anesthesiologist: Barnet Glasgow, MD Preanesthetic Checklist Completed: patient identified, IV checked, risks and benefits discussed, surgical consent, monitors and equipment checked, pre-op evaluation and timeout performed Spinal Block Patient position: sitting Prep: DuraPrep and site prepped and draped Patient monitoring: heart rate, cardiac monitor, continuous pulse ox and blood pressure Approach: midline Location: L3-4 Injection technique: single-shot Needle Needle type: Pencan  Needle gauge: 24 G Needle length: 10 cm Needle insertion depth: 6 cm Assessment Sensory level: T4 Additional Notes 1 Attempt (s). Pt tolerated procedure well.

## 2020-03-10 NOTE — Op Note (Signed)
Total Knee Arthroplasty Procedure Note  Preoperative diagnosis: Left knee osteoarthritis  Postoperative diagnosis:same  Operative procedure: Left total knee arthroplasty. CPT [phone]  Surgeon: N. Eduard Roux, MD  Assist: Madalyn Rob, PA-C; necessary for the timely completion of procedure and due to complexity of procedure.  Anesthesia: Spinal, regional, local  Tourniquet time: see anesthesia record  Implants used: Zimmer persona Femur: CR 6 narrow Tibia: D Patella: 29 mm Polyethylene: 12 mm, MC  Indication: Penny Gibson is a 74 y.o. year old female with a history of knee pain. Having failed conservative management, the patient elected to proceed with a total knee arthroplasty.  We have reviewed the risk and benefits of the surgery and they elected to proceed after voicing understanding.  Procedure:  After informed consent was obtained and understanding of the risk were voiced including but not limited to bleeding, infection, damage to surrounding structures including nerves and vessels, blood clots, leg length inequality and the failure to achieve desired results, the operative extremity was marked with verbal confirmation of the patient in the holding area.   The patient was then brought to the operating room and transported to the operating room table in the supine position.  A tourniquet was applied to the operative extremity around the upper thigh. The operative limb was then prepped and draped in the usual sterile fashion and preoperative antibiotics were administered.  A time out was performed prior to the start of surgery confirming the correct extremity, preoperative antibiotic administration, as well as team members, implants and instruments available for the case. Correct surgical site was also confirmed with preoperative radiographs. The limb was then elevated for exsanguination and the tourniquet was inflated. A midline incision was made and a standard  medial parapatellar approach was performed.  The infrapatellar fat pad was removed.  Suprapatellar synovium was removed to reveal the anterior distal femoral cortex.  A medial peel was performed to release the capsule of the medial tibial plateau.  The patella was then everted and was prepared and sized to a 29 mm.  A cover was placed on the patella for protection from retractors.  The knee was then brought into full flexion and we then turned our attention to the femur.  The cruciates were sacrificed.  Start site was drilled in the femur and the intramedullary distal femoral cutting guide was placed, set at 5 degrees valgus, taking 10 mm of distal resection. The distal cut was made. Osteophytes were then removed.  Next, the proximal tibial cutting guide was placed with appropriate slope, varus/valgus alignment and depth of resection. The proximal tibial cut was made. Gap blocks were then used to assess the extension gap and alignment, and appropriate soft tissue releases were performed. Attention was turned back to the femur, which was sized using the sizing guide to a size 6. Appropriate rotation of the femoral component was determined using epicondylar axis, Whiteside's line, and assessing the flexion gap under ligament tension. The appropriate size 4-in-1 cutting block was placed and checked with an angel wing and cuts were made. Posterior femoral osteophytes and uncapped bone were then removed with the curved osteotome.  Trial components were placed, and stability was checked in full extension, mid-flexion, and deep flexion. Proper tibial rotation was determined and marked.  The patella tracked well without a lateral release.  The femoral lugs were then drilled. Trial components were then removed and tibial preparation performed.  The tibia was sized for a size D component.  A posterior  capsular injection comprising of 20 cc of 1.3% exparel, 20 cc of 0.25% bupivicaine with epi and 20 cc of normal saline was  performed for postoperative pain control. The bony surfaces were irrigated with a pulse lavage and then dried. Bone cement was vacuum mixed on the back table, and the final components sized above were cemented into place.  Antibiotic irrigation was placed in the knee joint and soft tissues while the cement cured.  After cement had finished curing, excess cement was removed. The stability of the construct was re-evaluated throughout a range of motion and found to be acceptable. The trial liner was removed, the knee was copiously irrigated, and the knee was re-evaluated for any excess bone debris. The real polyethylene liner, 12 mm thick, was inserted and checked to ensure the locking mechanism had engaged appropriately. The tourniquet was deflated and hemostasis was achieved. The wound was irrigated with normal saline.  One gram of vancomycin powder was placed in the surgical bed.  Capsular closure was performed with a #1 vicryl, subcutaneous fat closed with a 0 vicryl suture, then subcutaneous tissue closed with interrupted 2.0 vicryl suture. The skin was then closed with a 2.0 nylon and dermabond. A sterile dressing was applied.  The patient was awakened in the operating room and taken to recovery in stable condition. All sponge, needle, and instrument counts were correct at the end of the case.  Position: supine  Complications: none.  Time Out: performed   Drains/Packing: none  Estimated blood loss: minimal  Returned to Recovery Room: in good condition.   Antibiotics: yes   Mechanical VTE (DVT) Prophylaxis: sequential compression devices, TED thigh-high  Chemical VTE (DVT) Prophylaxis: aspirin  Fluid Replacement  Crystalloid: see anesthesia record Blood: none  FFP: none   Specimens Removed: 1 to pathology   Sponge and Instrument Count Correct? yes   PACU: portable radiograph - knee AP and Lateral   Plan/RTC: Return in 2 weeks for wound check.   Weight Bearing/Load Lower Extremity:  full   N. Eduard Roux, MD [facility] 2:30 PM

## 2020-03-10 NOTE — Progress Notes (Signed)
AssistedDr. Houser with left, ultrasound guided, adductor canal block. Side rails up, monitors on throughout procedure. See vital signs in flow sheet. Tolerated Procedure well.  

## 2020-03-10 NOTE — H&P (Signed)
PREOPERATIVE H&P  Chief Complaint: left knee degenerative joint disease  HPI: Penny Gibson is a 74 y.o. female who presents for surgical treatment of left knee degenerative joint disease.  She denies any changes in medical history.  Past Medical History:  Diagnosis Date  . Anemia   . Arthritis   . Asthma   . Constipation   . Diabetes mellitus without complication ([place])   . Dyspnea   . Hyperlipidemia   . Hypertension   . Insomnia   . Joint pain   . Knee pain   . Lactose intolerance   . Lower extremity edema   . Multiple food allergies   . Osteoarthritis   . Sinus problem    Past Surgical History:  Procedure Laterality Date  . BREAST BIOPSY Left 2010  . BREAST LUMPECTOMY Left 2010  . EYE SURGERY Bilateral    cataract surgery  . TUBAL LIGATION     Social History   Socioeconomic History  . Marital status: Divorced    Spouse name: Not on file  . Number of children: Not on file  . Years of education: Not on file  . Highest education level: Not on file  Occupational History  . Not on file  Tobacco Use  . Smoking status: Never Smoker  . Smokeless tobacco: Never Used  Vaping Use  . Vaping Use: Never used  Substance and Sexual Activity  . Alcohol use: No  . Drug use: No  . Sexual activity: Not Currently    Birth control/protection: Surgical  Other Topics Concern  . Not on file  Social History Narrative  . Not on file   Social Determinants of Health   Financial Resource Strain: Low Risk   . Difficulty of Paying Living Expenses: Not hard at all  Food Insecurity:   . Worried About Charity fundraiser in the Last Year: Not on file  . Ran Out of Food in the Last Year: Not on file  Transportation Needs: No Transportation Needs  . Lack of Transportation (Medical): No  . Lack of Transportation (Non-Medical): No  Physical Activity:   . Days of Exercise per Week: Not on file  . Minutes of Exercise per Session: Not on file  Stress:   . Feeling of  Stress : Not on file  Social Connections:   . Frequency of Communication with Friends and Family: Not on file  . Frequency of Social Gatherings with Friends and Family: Not on file  . Attends Religious Services: Not on file  . Active Member of Clubs or Organizations: Not on file  . Attends Archivist Meetings: Not on file  . Marital Status: Not on file   Family History  Problem Relation Age of Onset  . Heart attack Mother   . Heart disease Mother   . Sudden death Mother   . Obesity Mother   . Cancer Sister   . Breast cancer Sister 53  . Stroke Maternal Grandmother   . Sudden death Father    Allergies  Allergen Reactions  . Feraheme [Ferumoxytol] Anaphylaxis  . Penicillins Anaphylaxis  . Citric Acid    Prior to Admission medications   Medication Sig Start Date End Date Taking? Authorizing Provider  albuterol (PROAIR HFA) 108 (90 Base) MCG/ACT inhaler Inhale 1-2 puffs into the lungs every 6 (six) hours as needed for wheezing or shortness of breath. 07/11/19  Yes [name], [name], NP  cetirizine (ZYRTEC) 10 MG tablet Take 10 mg by mouth  daily.   Yes [provider]  diphenhydramine-acetaminophen (TYLENOL PM) 25-500 MG TABS tablet Take 1 tablet by mouth at bedtime as needed.   Yes [provider]  hydrochlorothiazide (MICROZIDE) 12.5 MG capsule TAKE 1 CAPSULE(12.5 MG) BY MOUTH DAILY 12/27/19  Yes [name], [name], NP  lisinopril (ZESTRIL) 40 MG tablet Take 1 tablet (40 mg total) by mouth daily. 07/11/19  Yes [name], [name], NP  Multiple Vitamins-Minerals (CENTRUM MULTIGUMMIES PO) Take 2 each by mouth daily.   Yes [provider]  traMADol (ULTRAM) 50 MG tablet Take 1 tablet (50 mg total) by mouth 3 (three) times daily as needed. 02/19/20  Yes Aundra Dubin, PA-C  Vitamin D, Ergocalciferol, (DRISDOL) 1.25 MG (50000 UNIT) CAPS capsule Take 1 capsule (50,000 Units total) by mouth every 7 (seven) days. 03/03/20  Yes Abby Potash, PA-C    aspirin EC 81 MG tablet Take 1 tablet (81 mg total) by mouth 2 (two) times daily. 03/07/20   Aundra Dubin, PA-C  celecoxib (CELEBREX) 200 MG capsule Take 1 capsule (200 mg total) by mouth 2 (two) times daily. 03/07/20 03/07/21  Aundra Dubin, PA-C  docusate sodium (COLACE) 100 MG capsule Take 1 capsule (100 mg total) by mouth daily as needed. 03/07/20 03/07/21  Aundra Dubin, PA-C  guaiFENesin (MUCINEX) 600 MG 12 hr tablet Take by mouth 2 (two) times daily.    [provider]  metFORMIN (GLUCOPHAGE) 500 MG tablet Take 1 tablet (500 mg total) by mouth daily with breakfast. 03/04/20   Abby Potash, PA-C  methocarbamol (ROBAXIN-750) 750 MG tablet Take 1 tablet (750 mg total) by mouth 3 (three) times daily as needed for muscle spasms. 03/07/20   Aundra Dubin, PA-C  ondansetron (ZOFRAN) 4 MG tablet Take 1 tablet (4 mg total) by mouth every 8 (eight) hours as needed for nausea or vomiting. 03/07/20   Aundra Dubin, PA-C  oxyCODONE-acetaminophen (PERCOCET) 5-325 MG tablet Take 1-2 tablets by mouth every 6 (six) hours as needed for severe pain. 03/07/20   Aundra Dubin, PA-C     Positive ROS: All other systems have been reviewed and were otherwise negative with the exception of those mentioned in the HPI and as above.  Physical Exam: General: Alert, no acute distress Cardiovascular: No pedal edema Respiratory: No cyanosis, no use of accessory musculature GI: abdomen soft Skin: No lesions in the area of chief complaint Neurologic: Sensation intact distally Psychiatric: Patient is competent for consent with normal mood and affect Lymphatic: no lymphedema  MUSCULOSKELETAL: exam stable  Assessment: left knee degenerative joint disease  Plan: Plan for Procedure(s): LEFT TOTAL KNEE ARTHROPLASTY  The risks benefits and alternatives were discussed with the patient including but not limited to the risks of nonoperative treatment, versus surgical intervention including  infection, bleeding, nerve injury,  blood clots, cardiopulmonary complications, morbidity, mortality, among others, and they were willing to proceed.   Preoperative templating of the joint replacement has been completed, documented, and submitted to the Operating Room personnel in order to optimize intra-operative equipment management.   Eduard Roux, MD 03/10/2020 6:12 AM

## 2020-03-10 NOTE — Anesthesia Procedure Notes (Signed)
Anesthesia Regional Block: Adductor canal block   Pre-Anesthetic Checklist: ,, timeout performed, Correct Patient, Correct Site, Correct Laterality, Correct Procedure, Correct Position, site marked, Risks and benefits discussed,  Surgical consent,  Pre-op evaluation,  At surgeon's request and post-op pain management  Laterality: Lower and Left  Prep: chloraprep       Needles:  Injection technique: Single-shot  Needle Type: Echogenic Needle     Needle Length: 9cm  Needle Gauge: 22     Additional Needles:   Procedures:,,,, ultrasound used (permanent image in chart),,,,  Narrative:  Start time: 03/10/2020 11:33 AM End time: 03/10/2020 11:40 AM Injection made incrementally with aspirations every 5 mL.  Performed by: Personally  Anesthesiologist: Barnet Glasgow, MD  Additional Notes: Block assessed prior to surgery. Pt tolerated procedure well.

## 2020-03-10 NOTE — Progress Notes (Signed)
She has a UTI but also has surgery today.  Will you let her know to pick up these meds and start when she gets home

## 2020-03-10 NOTE — Evaluation (Signed)
Physical Therapy Evaluation Patient Details Name: Penny Gibson MRN: [id number] DOB: May 28, 1946 Today's Date: 03/10/2020   History of Present Illness  Pt is a 74 y/o female s/p L TKA. PMH includes asthma, DM, and HTN.   Clinical Impression  Pt admitted secondary to problem above with deficits below. Pt requiring min A to stand at EOB, however, mobility limited secondary to increased pain and nausea. Reviewed knee precautions with pt. Will continue to follow acutely to maximize functional mobility independence and safety.     Follow Up Recommendations Follow surgeon's recommendation for DC plan and follow-up therapies;Supervision for mobility/OOB    Equipment Recommendations  None recommended by PT    Recommendations for Other Services       Precautions / Restrictions Precautions Precautions: Knee Precaution Booklet Issued: No Precaution Comments: Verbally reviewed knee precautions  Restrictions Weight Bearing Restrictions: Yes LLE Weight Bearing: Weight bearing as tolerated      Mobility  Bed Mobility Overal bed mobility: Needs Assistance Bed Mobility: Supine to Sit;Sit to Supine     Supine to sit: Min assist Sit to supine: Min assist   General bed mobility comments: Required assist for LLE. Increased time required to complete bed mobility.   Transfers Overall transfer level: Needs assistance Equipment used: Rolling walker (2 wheeled) Transfers: Sit to/from Stand Sit to Stand: Min assist         General transfer comment: Min A for lift assist and steadying to stand. Pt with increased pain and nausea in standing, so further mobility deferred.   Ambulation/Gait                Stairs            Wheelchair Mobility    Modified Rankin (Stroke Patients Only)       Balance Overall balance assessment: Needs assistance Sitting-balance support: Feet supported;No upper extremity supported Sitting balance-Leahy Scale: Fair     Standing  balance support: Bilateral upper extremity supported;During functional activity Standing balance-Leahy Scale: Poor Standing balance comment: Reliant on bUE support                              Pertinent Vitals/Pain Pain Assessment: 0-10 Pain Score: 9  Pain Location: L knee  Pain Descriptors / Indicators: Aching;Operative site guarding Pain Intervention(s): Limited activity within patient's tolerance;Monitored during session;Repositioned    Home Living Family/patient expects to be discharged to:: Private residence Living Arrangements: Alone Available Help at Discharge: Family;Available 24 hours/day Type of Home: House Home Access: Level entry     Home Layout: One level Home Equipment: Walker - 2 wheels;Bedside commode Additional Comments: Reports daughter will be staying with her.     Prior Function Level of Independence: Independent               Hand Dominance        Extremity/Trunk Assessment   Upper Extremity Assessment Upper Extremity Assessment: Overall WFL for tasks assessed    Lower Extremity Assessment Lower Extremity Assessment: LLE deficits/detail LLE Deficits / Details: Deficits consistent with post op pain and weakness.     Cervical / Trunk Assessment Cervical / Trunk Assessment: Normal  Communication   Communication: No difficulties  Cognition Arousal/Alertness: Awake/alert Behavior During Therapy: WFL for tasks assessed/performed Overall Cognitive Status: Within Functional Limits for tasks assessed  General Comments      Exercises Total Joint Exercises Ankle Circles/Pumps: AROM;Both;10 reps;Supine   Assessment/Plan    PT Assessment Patient needs continued PT services  PT Problem List Decreased strength;Decreased range of motion;Decreased activity tolerance;Decreased balance;Decreased mobility;Decreased knowledge of use of DME;Decreased knowledge of precautions;Pain        PT Treatment Interventions Gait training;DME instruction;Therapeutic exercise;Functional mobility training;Therapeutic activities;Balance training;Patient/family education    PT Goals (Current goals can be found in the Care Plan section)  Acute Rehab PT Goals Patient Stated Goal: to decrease pain  PT Goal Formulation: With patient Time For Goal Achievement: 03/24/20 Potential to Achieve Goals: Good    Frequency 7X/week   Barriers to discharge        Co-evaluation               AM-PAC PT "6 Clicks" Mobility  Outcome Measure Help needed turning from your back to your side while in a flat bed without using bedrails?: A Little Help needed moving from lying on your back to sitting on the side of a flat bed without using bedrails?: A Little Help needed moving to and from a bed to a chair (including a wheelchair)?: A Little Help needed standing up from a chair using your arms (e.g., wheelchair or bedside chair)?: A Little Help needed to walk in hospital room?: A Lot Help needed climbing 3-5 steps with a railing? : Total 6 Click Score: 15    End of Session Equipment Utilized During Treatment: Gait belt Activity Tolerance: Patient limited by pain;Treatment limited secondary to medical complications (Comment) (nausea) Patient left: in bed;with call bell/phone within reach Nurse Communication: Mobility status;Other (comment) (pt needs nausea meds) PT Visit Diagnosis: Muscle weakness (generalized) (M62.81);Difficulty in walking, not elsewhere classified (R26.2);Pain Pain - Right/Left: Left Pain - part of body: Knee    Time: 0981-1914 PT Time Calculation (min) (ACUTE ONLY): 15 min   Charges:   PT Evaluation $PT Eval Low Complexity: 1 Low          Lou Miner, DPT  Acute Rehabilitation Services  Pager: [phone] Office: [phone]   Rudean Hitt 03/10/2020, 6:26 PM

## 2020-03-10 NOTE — Anesthesia Postprocedure Evaluation (Signed)
Anesthesia Post Note  Patient: Penny Gibson  Procedure(s) Performed: LEFT TOTAL KNEE ARTHROPLASTY (Left Knee)     Patient location during evaluation: Nursing Unit Anesthesia Type: Spinal Level of consciousness: oriented and awake and alert Pain management: pain level controlled Vital Signs Assessment: post-procedure vital signs reviewed and stable Respiratory status: spontaneous breathing and respiratory function stable Cardiovascular status: blood pressure returned to baseline and stable Postop Assessment: no headache, no backache, no apparent nausea or vomiting and patient able to bend at knees Anesthetic complications: no   No complications documented.  Last Vitals:  Vitals:   03/10/20 1530 03/10/20 1545  BP: (!) 108/53 106/68  Pulse: 93 88  Resp: (!) 29 19  Temp:    SpO2: 100% 100%    Last Pain:  Vitals:   03/10/20 1545  TempSrc:   PainSc: 0-No pain                 Barnet Glasgow

## 2020-03-11 ENCOUNTER — Encounter (HOSPITAL_BASED_OUTPATIENT_CLINIC_OR_DEPARTMENT_OTHER): Payer: Self-pay | Admitting: Orthopaedic Surgery

## 2020-03-11 DIAGNOSIS — M1712 Unilateral primary osteoarthritis, left knee: Secondary | ICD-10-CM | POA: Diagnosis not present

## 2020-03-11 LAB — GLUCOSE, CAPILLARY: Glucose-Capillary: 128 mg/dL — ABNORMAL HIGH (ref 70–99)

## 2020-03-11 NOTE — Progress Notes (Signed)
Subjective: 1 Day Post-Op Procedure(s) (LRB): LEFT TOTAL KNEE ARTHROPLASTY (Left) Patient reports pain as mild.    Objective: Vital signs in last 24 hours: Temp:  [97.6 F (36.4 C)-98 F (36.7 C)] 98 F (36.7 C) (09/21 0700) Pulse Rate:  [67-94] 84 (09/21 0700) Resp:  [16-29] 18 (09/21 0700) BP: (96-137)/(53-78) 104/71 (09/21 0700) SpO2:  [93 %-100 %] 100 % (09/21 0700) Weight:  [93.8 kg] 93.8 kg (09/20 1032)  Intake/Output from previous day: 09/20 0701 - 09/21 0700 In: 1970 [P.O.:560; I.V.:1310; IV Piggyback:100] Out: 1350 [Urine:1300; Blood:50] Intake/Output this shift: No intake/output data recorded.  No results for input(s): HGB in the last 72 hours. No results for input(s): WBC, RBC, HCT, PLT in the last 72 hours. No results for input(s): NA, K, CL, CO2, BUN, CREATININE, GLUCOSE, CALCIUM in the last 72 hours. No results for input(s): LABPT, INR in the last 72 hours.  Neurologically intact Neurovascular intact Sensation intact distally Intact pulses distally Dorsiflexion/Plantar flexion intact Incision: dressing C/D/I No cellulitis present Compartment soft   Assessment/Plan: 1 Day Post-Op Procedure(s) (LRB): LEFT TOTAL KNEE ARTHROPLASTY (Left) Advance diet Up with therapy D/C IV fluids Discharge home with home health WBAT LLE F/U with Dr. Erlinda Hong 2 weeks post-op for wound check   Patient's anticipated LOS is less than 2 midnights, meeting these requirements: - Younger than 59 - Lives within 1 hour of care - Has a competent adult at home to recover with post-op recover - NO history of  - Chronic pain requiring opiods  - Diabetes  - Coronary Artery Disease  - Heart failure  - Heart attack  - Stroke  - DVT/VTE  - Cardiac arrhythmia  - Respiratory Failure/COPD  - Renal failure  - Anemia  - Advanced Liver disease       Aundra Dubin 03/11/2020, 7:56 AM

## 2020-03-11 NOTE — Discharge Summary (Signed)
Patient ID: Penny Gibson MRN: [id number] DOB/AGE: 73-Sep-1947 36 y.o.  Admit date: 03/10/2020 Discharge date: 03/11/2020  Admission Diagnoses:  Principal Problem:   Primary osteoarthritis of left knee Active Problems:   Status post total left knee replacement   Discharge Diagnoses:  Same  Past Medical History:  Diagnosis Date  . Anemia   . Arthritis   . Asthma   . Constipation   . Diabetes mellitus without complication ([place])   . Dyspnea   . Hyperlipidemia   . Hypertension   . Insomnia   . Joint pain   . Knee pain   . Lactose intolerance   . Lower extremity edema   . Multiple food allergies   . Osteoarthritis   . Sinus problem     Surgeries: Procedure(s): LEFT TOTAL KNEE ARTHROPLASTY on 03/10/2020   Consultants:   Discharged Condition: Improved  Hospital Course: Penny Gibson is an 74 y.o. female who was admitted 03/10/2020 for operative treatment ofPrimary osteoarthritis of left knee. Patient has severe unremitting pain that affects sleep, daily activities, and work/hobbies. After pre-op clearance the patient was taken to the operating room on 03/10/2020 and underwent  Procedure(s): LEFT TOTAL KNEE ARTHROPLASTY.    Patient was given perioperative antibiotics:  Anti-infectives (From admission, onward)   Start     Dose/Rate Route Frequency Ordered Stop   03/10/20 2300  sulfamethoxazole-trimethoprim (BACTRIM DS) 800-160 MG per tablet 1 tablet        1 tablet Oral 2 times daily 03/10/20 1627     03/10/20 1630  vancomycin (VANCOREADY) IVPB 1250 mg/250 mL        1,250 mg 166.7 mL/hr over 90 Minutes Intravenous Every 12 hours 03/10/20 1627 03/11/20 1629   03/10/20 1420  vancomycin (VANCOCIN) powder  Status:  Discontinued          As needed 03/10/20 1420 03/10/20 1511   03/10/20 1015  vancomycin (VANCOCIN) IVPB 1000 mg/200 mL premix        1,000 mg 200 mL/hr over 60 Minutes Intravenous On call to O.R. 03/10/20 1007 03/10/20 1339       Patient  was given sequential compression devices, early ambulation, and chemoprophylaxis to prevent DVT.  Patient benefited maximally from hospital stay and there were no complications.    Recent vital signs:  Patient Vitals for the past 24 hrs:  BP Temp Temp src Pulse Resp SpO2 Height Weight  03/11/20 0700 104/71 98 F (36.7 C) -- 84 18 100 % -- --  03/11/20 0300 120/64 97.7 F (36.5 C) -- 78 16 98 % -- --  03/11/20 0100 -- -- -- -- -- 97 % -- --  03/11/20 0000 -- -- -- -- -- 97 % -- --  03/10/20 2300 -- -- -- -- -- 98 % -- --  03/10/20 2200 137/78 98 F (36.7 C) -- 88 18 100 % -- --  03/10/20 2115 -- -- -- -- -- 99 % -- --  03/10/20 2045 -- -- -- -- -- 98 % -- --  03/10/20 1815 111/66 97.6 F (36.4 C) -- 87 20 100 % -- --  03/10/20 1745 112/67 -- -- 83 18 97 % -- --  03/10/20 1625 (!) 107/54 97.7 F (36.5 C) -- 91 18 93 % -- --  03/10/20 1545 106/68 -- -- 88 19 100 % -- --  03/10/20 1530 (!) 108/53 -- -- 93 (!) 29 100 % -- --  03/10/20 1515 (!) 96/57 97.7 F (36.5 C) -- 94 (!) 27 100 % -- --  03/10/20 1230 119/71 -- -- 67 (!) 29 100 % -- --  03/10/20 1215 121/72 -- -- 70 (!) 25 100 % -- --  03/10/20 1200 124/70 -- -- 72 (!) 27 100 % -- --  03/10/20 1145 121/71 -- -- 71 20 100 % -- --  03/10/20 1140 121/71 -- -- 73 16 100 % -- --  03/10/20 1135 124/74 -- -- 78 17 100 % -- --  03/10/20 1130 119/65 -- -- 77 17 100 % -- --  03/10/20 1032 (!) 112/59 97.9 F (36.6 C) Oral 83 16 100 % 5\' 1"  (1.549 m) 93.8 kg     Recent laboratory studies: No results for input(s): WBC, HGB, HCT, PLT, NA, K, CL, CO2, BUN, CREATININE, GLUCOSE, INR, CALCIUM in the last 72 hours.  Invalid input(s): PT, 2   Discharge Medications:   Allergies as of 03/11/2020      Reactions   Feraheme [ferumoxytol] Anaphylaxis   Penicillins Anaphylaxis   Citric Acid Rash      Medication List    STOP taking these medications   diphenhydramine-acetaminophen 25-500 MG Tabs tablet Commonly known as: TYLENOL PM    traMADol 50 MG tablet Commonly known as: ULTRAM     TAKE these medications   albuterol 108 (90 Base) MCG/ACT inhaler Commonly known as: ProAir HFA Inhale 1-2 puffs into the lungs every 6 (six) hours as needed for wheezing or shortness of breath.   aspirin EC 81 MG tablet Take 1 tablet (81 mg total) by mouth 2 (two) times daily.   celecoxib 200 MG capsule Commonly known as: CeleBREX Take 1 capsule (200 mg total) by mouth 2 (two) times daily.   CENTRUM MULTIGUMMIES PO Take 2 each by mouth daily.   cetirizine 10 MG tablet Commonly known as: ZYRTEC Take 10 mg by mouth daily.   docusate sodium 100 MG capsule Commonly known as: Colace Take 1 capsule (100 mg total) by mouth daily as needed.   guaiFENesin 600 MG 12 hr tablet Commonly known as: MUCINEX Take by mouth 2 (two) times daily.   hydrochlorothiazide 12.5 MG capsule Commonly known as: MICROZIDE TAKE 1 CAPSULE(12.5 MG) BY MOUTH DAILY   lisinopril 40 MG tablet Commonly known as: ZESTRIL Take 1 tablet (40 mg total) by mouth daily.   metFORMIN 500 MG tablet Commonly known as: GLUCOPHAGE Take 1 tablet (500 mg total) by mouth daily with breakfast.   methocarbamol 750 MG tablet Commonly known as: Robaxin-750 Take 1 tablet (750 mg total) by mouth 3 (three) times daily as needed for muscle spasms.   ondansetron 4 MG tablet Commonly known as: Zofran Take 1 tablet (4 mg total) by mouth every 8 (eight) hours as needed for nausea or vomiting.   oxyCODONE-acetaminophen 5-325 MG tablet Commonly known as: Percocet Take 1-2 tablets by mouth every 6 (six) hours as needed for severe pain.   sulfamethoxazole-trimethoprim 800-160 MG tablet Commonly known as: BACTRIM DS Take 1 tablet by mouth 2 (two) times daily.   Vitamin D (Ergocalciferol) 1.25 MG (50000 UNIT) Caps capsule Commonly known as: DRISDOL Take 1 capsule (50,000 Units total) by mouth every 7 (seven) days.            Durable Medical Equipment  (From  admission, onward)         Start     Ordered   03/10/20 1627  DME Walker rolling  Once       Question:  Patient needs a walker to treat with the following condition  Answer:  Total knee replacement status   03/10/20 1627   03/10/20 1627  DME 3 n 1  Once        03/10/20 1627   03/10/20 1627  DME Bedside commode  Once       Question:  Patient needs a bedside commode to treat with the following condition  Answer:  Total knee replacement status   03/10/20 1627          Diagnostic Studies: DG Chest 2 View  Result Date: 03/07/2020 CLINICAL DATA:  Left knee surgery, preoperative examination EXAM: CHEST - 2 VIEW COMPARISON:  CT 06/27/2018 FINDINGS: Lungs are clear. No pneumothorax or pleural effusion. Rounded opacity overlying the right hilum and suprahilar region represents vascular shadow related to the superior vena cava better noted on prior CT examination. Cardiac size within normal limits. Pulmonary vascularity is normal. No acute bone abnormality. IMPRESSION: No active cardiopulmonary disease. Electronically Signed   By: Fidela Salisbury MD   On: 03/07/2020 22:20   DG Knee Left Port  Result Date: 03/10/2020 CLINICAL DATA:  Left total knee arthroplasty EXAM: PORTABLE LEFT KNEE - 1-2 VIEW COMPARISON:  10/16/2019 FINDINGS: Frontal and cross-table lateral views of the left knee demonstrate left knee arthroplasty in the expected position without signs of acute complication. No fractures. Subcutaneous gas within the anterior soft tissues compatible with recent surgical intervention. IMPRESSION: 1. Unremarkable left knee arthroplasty. Electronically Signed   By: Randa Ngo M.D.   On: 03/10/2020 16:28   XR Toe Great Right  Result Date: 02/19/2020 Moderate degenerative changes to the right great toe   Disposition: Discharge disposition: 01-Home or Self Care          Follow-up Information    Leandrew Koyanagi, MD. Schedule an appointment as soon as possible for a visit in 2 weeks.    Specialty: Orthopedic Surgery Contact information: [address] [place] [address] [phone]                Signed: Aundra Dubin 03/11/2020, 7:59 AM

## 2020-03-11 NOTE — Discharge Instructions (Signed)
 INSTRUCTIONS AFTER JOINT REPLACEMENT   o Remove items at home which could result in a fall. This includes throw rugs or furniture in walking pathways o ICE to the affected joint every three hours while awake for 30 minutes at a time, for at least the first 3-5 days, and then as needed for pain and swelling.  Continue to use ice for pain and swelling. You may notice swelling that will progress down to the foot and ankle.  This is normal after surgery.  Elevate your leg when you are not up walking on it.   o Continue to use the breathing machine you got in the hospital (incentive spirometer) which will help keep your temperature down.  It is common for your temperature to cycle up and down following surgery, especially at night when you are not up moving around and exerting yourself.  The breathing machine keeps your lungs expanded and your temperature down.   DIET:  As you were doing prior to hospitalization, we recommend a well-balanced diet.  DRESSING / WOUND CARE / SHOWERING  You may change your surgical dressing 7 days after surgery.  Then change the dressing every day with sterile gauze.  Please use good hand washing techniques before changing the dressing.  Do not use any lotions or creams on the incision until instructed by your surgeon.  You may shower while you have the surgical dressing which is waterproof.  After removal of surgical dressing, you must cover the incision when showering.  ACTIVITY  o Increase activity slowly as tolerated, but follow the weight bearing instructions below.   o No driving for 6 weeks or until further direction given by your physician.  You cannot drive while taking narcotics.  o No lifting or carrying greater than 10 lbs. until further directed by your surgeon. o Avoid periods of inactivity such as sitting longer than an hour when not asleep. This helps prevent blood clots.  o You may return to work once you are authorized by your doctor.     WEIGHT  BEARING   Weight bearing as tolerated with assist device (walker, cane, etc) as directed, use it as long as suggested by your surgeon or therapist, typically at least 4-6 weeks.   EXERCISES  Results after joint replacement surgery are often greatly improved when you follow the exercise, range of motion and muscle strengthening exercises prescribed by your doctor. Safety measures are also important to protect the joint from further injury. Any time any of these exercises cause you to have increased pain or swelling, decrease what you are doing until you are comfortable again and then slowly increase them. If you have problems or questions, call your caregiver or physical therapist for advice.   Rehabilitation is important following a joint replacement. After just a few days of immobilization, the muscles of the leg can become weakened and shrink (atrophy).  These exercises are designed to build up the tone and strength of the thigh and leg muscles and to improve motion. Often times heat used for twenty to thirty minutes before working out will loosen up your tissues and help with improving the range of motion but do not use heat for the first two weeks following surgery (sometimes heat can increase post-operative swelling).   These exercises can be done on a training (exercise) mat, on the floor, on a table or on a bed. Use whatever works the best and is most comfortable for you.    Use music or television   while you are exercising so that the exercises are a pleasant break in your day. This will make your life better with the exercises acting as a break in your routine that you can look forward to.   Perform all exercises about fifteen times, three times per day or as directed.  You should exercise both the operative leg and the other leg as well.  Exercises include:   . Quad Sets - Tighten up the muscle on the front of the thigh (Quad) and hold for 5-10 seconds.   . Straight Leg Raises - With your  knee straight (if you were given a brace, keep it on), lift the leg to 60 degrees, hold for 3 seconds, and slowly lower the leg.  Perform this exercise against resistance later as your leg gets stronger.  . Leg Slides: Lying on your back, slowly slide your foot toward your buttocks, bending your knee up off the floor (only go as far as is comfortable). Then slowly slide your foot back down until your leg is flat on the floor again.  . Angel Wings: Lying on your back spread your legs to the side as far apart as you can without causing discomfort.  . Hamstring Strength:  Lying on your back, push your heel against the floor with your leg straight by tightening up the muscles of your buttocks.  Repeat, but this time bend your knee to a comfortable angle, and push your heel against the floor.  You may put a pillow under the heel to make it more comfortable if necessary.   A rehabilitation program following joint replacement surgery can speed recovery and prevent re-injury in the future due to weakened muscles. Contact your doctor or a physical therapist for more information on knee rehabilitation.    CONSTIPATION  Constipation is defined medically as fewer than three stools per week and severe constipation as less than one stool per week.  Even if you have a regular bowel pattern at home, your normal regimen is likely to be disrupted due to multiple reasons following surgery.  Combination of anesthesia, postoperative narcotics, change in appetite and fluid intake all can affect your bowels.   YOU MUST use at least one of the following options; they are listed in order of increasing strength to get the job done.  They are all available over the counter, and you may need to use some, POSSIBLY even all of these options:    Drink plenty of fluids (prune juice may be helpful) and high fiber foods Colace 100 mg by mouth twice a day  Senokot for constipation as directed and as needed Dulcolax (bisacodyl), take  with full glass of water  Miralax (polyethylene glycol) once or twice a day as needed.  If you have tried all these things and are unable to have a bowel movement in the first 3-4 days after surgery call either your surgeon or your primary doctor.    If you experience loose stools or diarrhea, hold the medications until you stool forms back up.  If your symptoms do not get better within 1 week or if they get worse, check with your doctor.  If you experience "the worst abdominal pain ever" or develop nausea or vomiting, please contact the office immediately for further recommendations for treatment.   ITCHING:  If you experience itching with your medications, try taking only a single pain pill, or even half a pain pill at a time.  You can also use Benadryl over the   counter for itching or also to help with sleep.   TED HOSE STOCKINGS:  Use stockings on both legs until for at least 2 weeks or as directed by physician office. They may be removed at night for sleeping.  MEDICATIONS:  See your medication summary on the "After Visit Summary" that nursing will review with you.  You may have some home medications which will be placed on hold until you complete the course of blood thinner medication.  It is important for you to complete the blood thinner medication as prescribed.  PRECAUTIONS:  If you experience chest pain or shortness of breath - call 911 immediately for transfer to the hospital emergency department.   If you develop a fever greater that 101 F, purulent drainage from wound, increased redness or drainage from wound, foul odor from the wound/dressing, or calf pain - CONTACT YOUR SURGEON.                                                   FOLLOW-UP APPOINTMENTS:  If you do not already have a post-op appointment, please call the office for an appointment to be seen by your surgeon.  Guidelines for how soon to be seen are listed in your "After Visit Summary", but are typically between 1-4 weeks  after surgery.  OTHER INSTRUCTIONS:   Knee Replacement:  Do not place pillow under knee, focus on keeping the knee straight while resting. CPM instructions: 0-90 degrees, 2 hours in the morning, 2 hours in the afternoon, and 2 hours in the evening. Place foam block, curve side up under heel at all times except when in CPM or when walking.  DO NOT modify, tear, cut, or change the foam block in any way.  MAKE SURE YOU:  . Understand these instructions.  . Get help right away if you are not doing well or get worse.     Dental Antibiotics:  In most cases prophylactic antibiotics for Dental procdeures after total joint surgery are not necessary.  Exceptions are as follows:  1. History of prior total joint infection  2. Severely immunocompromised (Organ Transplant, cancer chemotherapy, Rheumatoid biologic meds such as Export)  3. Poorly controlled diabetes (A1C &gt; 8.0, blood glucose over 200)  If you have one of these conditions, contact your surgeon for an antibiotic prescription, prior to your dental procedure.   Thank you for letting us be a part of your medical care team.  It is a privilege we respect greatly.  We hope these instructions will help you stay on track for a fast and full recovery!    Information for Discharge Teaching: EXPAREL (bupivacaine liposome injectable suspension)   Your surgeon or anesthesiologist gave you EXPAREL(bupivacaine) to help control your pain after surgery.   EXPAREL is a local anesthetic that provides pain relief by numbing the tissue around the surgical site.  EXPAREL is designed to release pain medication over time and can control pain for up to 72 hours.  Depending on how you respond to EXPAREL, you may require less pain medication during your recovery.  Possible side effects:  Temporary loss of sensation or ability to move in the area where bupivacaine was injected.  Nausea, vomiting, constipation  Rarely, numbness and tingling in  your mouth or lips, lightheadedness, or anxiety may occur.  Call your doctor right away if you think  you may be experiencing any of these sensations, or if you have other questions regarding possible side effects.  Follow all other discharge instructions given to you by your surgeon or nurse. Eat a healthy diet and drink plenty of water or other fluids.  If you return to the hospital for any reason within 96 hours following the administration of EXPAREL, it is important for health care providers to know that you have received this anesthetic. A teal colored band has been placed on your arm with the date, time and amount of EXPAREL you have received in order to alert and inform your health care providers. Please leave this armband in place for the full 96 hours following administration, and then you may remove the band.

## 2020-03-11 NOTE — Progress Notes (Signed)
Physical Therapy Treatment Patient Details Name: Penny Gibson MRN: [id number] DOB: 07/20/1945 Today's Date: 03/11/2020    History of Present Illness Pt is a 74 y/o female s/p L TKA. PMH includes asthma, DM, and HTN.     PT Comments    Pt educated on L TKA precautions, seated and supine L LE HEP, completed AAROM to L knee into flexion in both supine and sitting, and ambulated 100' with RW and min guard. Pt without L knee buckling during ambulation. Pt states she doesn't have stairs to enter home and will have her daughter there 24/7 for the next few days. Acute PT to cont to follow.   Follow Up Recommendations  Follow surgeon's recommendation for DC plan and follow-up therapies (HHPT)     Equipment Recommendations  None recommended by PT (per pt, pt has BSC and RW)    Recommendations for Other Services       Precautions / Restrictions Precautions Precautions: Knee Precaution Booklet Issued: Yes (comment) Precaution Comments: went over hand out and HEP Restrictions Weight Bearing Restrictions: Yes LLE Weight Bearing: Weight bearing as tolerated    Mobility  Bed Mobility Overal bed mobility: Needs Assistance Bed Mobility: Supine to Sit;Sit to Supine     Supine to sit: Min guard Sit to supine: Min guard   General bed mobility comments: instructed pt on how to use R LE to assist L LE up onto bed, HOB elevated  Transfers Overall transfer level: Needs assistance Equipment used: Rolling walker (2 wheeled) Transfers: Sit to/from Stand Sit to Stand: Min guard         General transfer comment: verbal cues for hand placement, increased time  Ambulation/Gait Ambulation/Gait assistance: Min guard Gait Distance (Feet): 100 Feet Assistive device: Rolling walker (2 wheeled) Gait Pattern/deviations: Step-to pattern;Step-through pattern;Decreased stride length;Decreased stance time - left Gait velocity: slow Gait velocity interpretation: <1.31 ft/sec, indicative  of household ambulator General Gait Details: verbal cues to perform L knee extension during stance phase, pt progressed from step to gait pattenr to short step through   [employer]    Modified Rankin (Stroke Patients Only)       Balance Overall balance assessment: Needs assistance Sitting-balance support: Feet supported;No upper extremity supported Sitting balance-Leahy Scale: Fair     Standing balance support: Bilateral upper extremity supported;During functional activity Standing balance-Leahy Scale: Poor Standing balance comment: dependent on RW support                            Cognition Arousal/Alertness: Awake/alert Behavior During Therapy: WFL for tasks assessed/performed Overall Cognitive Status: Within Functional Limits for tasks assessed                                 General Comments: mildly delayed response time but appropriate      Exercises Total Joint Exercises Ankle Circles/Pumps: AROM;Both;10 reps;Supine Quad Sets: AROM;Left;10 reps;Supine Towel Squeeze: AROM;Left;10 reps;Supine Short Arc Quad: AAROM;Left;10 reps;Supine Heel Slides: AAROM;Left;10 reps;Supine (and seated with a towel on floor) Long Arc Quad: AAROM;Left;10 reps;Supine    General Comments General comments (skin integrity, edema, etc.): VSS      Pertinent Vitals/Pain Pain Assessment: 0-10 Pain Score: 6  Pain Location: L knee Pain Descriptors / Indicators: Sore Pain Intervention(s): Monitored during session    Home Living  Prior Function            PT Goals (current goals can now be found in the care plan section) Progress towards PT goals: Progressing toward goals    Frequency    7X/week      PT Plan Current plan remains appropriate    Co-evaluation              AM-PAC PT "6 Clicks" Mobility   Outcome Measure  Help needed turning from your back to your side while in  a flat bed without using bedrails?: A Little Help needed moving from lying on your back to sitting on the side of a flat bed without using bedrails?: A Little Help needed moving to and from a bed to a chair (including a wheelchair)?: A Little Help needed standing up from a chair using your arms (e.g., wheelchair or bedside chair)?: A Little Help needed to walk in hospital room?: A Little Help needed climbing 3-5 steps with a railing? : A Lot 6 Click Score: 17    End of Session Equipment Utilized During Treatment: Gait belt Activity Tolerance: Patient tolerated treatment well Patient left: in bed;with call bell/phone within reach (pt left sitting EOB) Nurse Communication: Mobility status PT Visit Diagnosis: Muscle weakness (generalized) (M62.81) Pain - Right/Left: Left Pain - part of body: Knee     Time: 0720-0800 PT Time Calculation (min) (ACUTE ONLY): 40 min  Charges:  $Gait Training: 8-22 mins $Therapeutic Exercise: 8-22 mins $Therapeutic Activity: 8-22 mins                     Kittie Plater, PT, DPT Acute Rehabilitation Services Pager #: [phone] Office #: [phone]    Berline Lopes 03/11/2020, 8:33 AM

## 2020-03-14 ENCOUNTER — Encounter (HOSPITAL_COMMUNITY): Payer: Self-pay | Admitting: Emergency Medicine

## 2020-03-14 ENCOUNTER — Telehealth: Payer: Self-pay | Admitting: *Deleted

## 2020-03-14 ENCOUNTER — Emergency Department (HOSPITAL_COMMUNITY)
Admission: EM | Admit: 2020-03-14 | Discharge: 2020-03-14 | Disposition: A | Discharge status: Home / Self Care | Payer: Medicare Other | Attending: Emergency Medicine | Admitting: Emergency Medicine

## 2020-03-14 ENCOUNTER — Telehealth: Payer: Self-pay

## 2020-03-14 DIAGNOSIS — Z5321 Procedure and treatment not carried out due to patient leaving prior to being seen by health care provider: Secondary | ICD-10-CM | POA: Diagnosis not present

## 2020-03-14 DIAGNOSIS — R1110 Vomiting, unspecified: Secondary | ICD-10-CM | POA: Insufficient documentation

## 2020-03-14 DIAGNOSIS — R031 Nonspecific low blood-pressure reading: Secondary | ICD-10-CM | POA: Insufficient documentation

## 2020-03-14 LAB — CBC
HCT: 28.9 % — ABNORMAL LOW (ref 36.0–46.0)
Hemoglobin: 8.7 g/dL — ABNORMAL LOW (ref 12.0–15.0)
MCH: 27.6 pg (ref 26.0–34.0)
MCHC: 30.1 g/dL (ref 30.0–36.0)
MCV: 91.7 fL (ref 80.0–100.0)
Platelets: 256 10*3/uL (ref 150–400)
RBC: 3.15 MIL/uL — ABNORMAL LOW (ref 3.87–5.11)
RDW: 16.3 % — ABNORMAL HIGH (ref 11.5–15.5)
WBC: 12.9 10*3/uL — ABNORMAL HIGH (ref 4.0–10.5)
nRBC: 0 % (ref 0.0–0.2)

## 2020-03-14 LAB — LIPASE, BLOOD: Lipase: 46 U/L (ref 11–51)

## 2020-03-14 LAB — COMPREHENSIVE METABOLIC PANEL
ALT: 14 U/L (ref 0–44)
AST: 16 U/L (ref 15–41)
Albumin: 3.2 g/dL — ABNORMAL LOW (ref 3.5–5.0)
Alkaline Phosphatase: 62 U/L (ref 38–126)
Anion gap: 15 (ref 5–15)
BUN: 63 mg/dL — ABNORMAL HIGH (ref 8–23)
CO2: 15 mmol/L — ABNORMAL LOW (ref 22–32)
Calcium: 9.1 mg/dL (ref 8.9–10.3)
Chloride: 102 mmol/L (ref 98–111)
Creatinine, Ser: 3.02 mg/dL — ABNORMAL HIGH (ref 0.44–1.00)
GFR calc Af Amer: 17 mL/min — ABNORMAL LOW (ref >60)
GFR calc non Af Amer: 15 mL/min — ABNORMAL LOW (ref >60)
Glucose, Bld: 93 mg/dL (ref 70–99)
Potassium: 4.4 mmol/L (ref 3.5–5.1)
Sodium: 132 mmol/L — ABNORMAL LOW (ref 135–145)
Total Bilirubin: 0.6 mg/dL (ref 0.3–1.2)
Total Protein: 7.6 g/dL (ref 6.5–8.1)

## 2020-03-14 NOTE — Telephone Encounter (Signed)
Per PCP recommendation, patient was advised to seek eval at ED due to BP and reported symptoms. Patient's daughter verbalized understanding.

## 2020-03-14 NOTE — Telephone Encounter (Signed)
Received IBC from Mr. Garnet Koyanagi reporting that patient's BP was 70/50 at beginning of therapy appt and subsequently 90/60 after hydration. Mr. Garnet Koyanagi states he reached out to ortho surgeon but was directed to contact PCP.   Negative for headache, dizziness, sweating, and diarrhea. Positive for constipation (last BM 03/09/20), epigastric pain, and vomiting (last episode 03/14/20). Denies taking any medication other than Bactrim and Percocet. Patient is alert, oriented, and able to verbalize needs.

## 2020-03-14 NOTE — Telephone Encounter (Signed)
Call to [place] [facility] asking about referral. Appointment for OPPT to begin on 03/26/20 at 11:45 am. Spoke with patient and she is aware.

## 2020-03-14 NOTE — ED Triage Notes (Signed)
Pt reports L knee surgery on Monday, pt states that she started vomiting and having low bp in the 70s (checked by her therapist) yesterday. Pt denies fevers or chills, no recent sick contacts.

## 2020-03-14 NOTE — ED Notes (Signed)
Pt does not want to wait any longer. Pt seen leaving

## 2020-03-25 ENCOUNTER — Other Ambulatory Visit: Payer: Self-pay

## 2020-03-25 ENCOUNTER — Ambulatory Visit (INDEPENDENT_AMBULATORY_CARE_PROVIDER_SITE_OTHER): Payer: Medicare Other | Admitting: Physician Assistant

## 2020-03-25 ENCOUNTER — Ambulatory Visit (HOSPITAL_COMMUNITY)
Admission: RE | Admit: 2020-03-25 | Discharge: 2020-03-25 | Disposition: A | Discharge status: Home / Self Care | Payer: Medicare Other | Source: Ambulatory Visit | Attending: Orthopaedic Surgery | Admitting: Orthopaedic Surgery

## 2020-03-25 ENCOUNTER — Encounter: Payer: Self-pay | Admitting: Orthopaedic Surgery

## 2020-03-25 VITALS — Ht 61.0 in | Wt 206.0 lb

## 2020-03-25 DIAGNOSIS — Z96652 Presence of left artificial knee joint: Secondary | ICD-10-CM

## 2020-03-25 MED ORDER — ACETAMINOPHEN-CODEINE #3 300-30 MG PO TABS: ORAL_TABLET | ORAL | 0 refills | Status: DC

## 2020-03-25 NOTE — Progress Notes (Signed)
Post-Op Visit Note   Patient: Penny Gibson           Date of Birth: 23-Feb-1946           MRN: [id number] Visit Date: 03/25/2020 PCP: Flossie Buffy, NP   Assessment & Plan:  Chief Complaint:  Chief Complaint  Patient presents with  . Left Knee - Follow-up    Left total knee arthroplasty 03/10/2020   Visit Diagnoses:  1. Hx of total knee replacement, left     Plan: Patient is a pleasant 74 year old female who comes in today 2 weeks out left total knee replacement 03/10/2020. She has been doing fairly well. She has been getting her home physical therapy where she is now ambulating with a cane. She does note pain to the left calf. She has been taking ibuprofen for pain. She has not been taking her aspirin for DVT prophylaxis. No fevers or chills. No new chest pain or shortness of breath. Examination of the left knee reveals a well-healing surgical incision with nylon sutures in place. No evidence of infection or cellulitis. Calf is moderately tender to the proximal aspect. Negative Homans. She is neurovascularly intact distally. At this point, sutures were removed and Steri-Strips applied. She is already scheduled to start outpatient physical therapy tomorrow. We will call in Tylenol 3. We will also order a venous Doppler ultrasound left lower extremity to rule out DVT. She will start taking her baby aspirin twice daily for the next 4 weeks unless she is told otherwise following the Doppler ultrasound. She will follow up with [place] in 4 weeks time for repeat evaluation and 2 view x-rays of the left knee. Dental prophylaxis reinforced. Call with concerns or questions.  Follow-Up Instructions: Return in about 4 weeks (around 04/22/2020).   Orders:  No orders of the defined types were placed in this encounter.  No orders of the defined types were placed in this encounter.   Imaging: No new imaging  PMFS History: Patient Active Problem List   Diagnosis Date Noted  . 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 Cardiomegaly 06/27/2018  . Iron deficiency anemia 07/19/2017  . Primary osteoarthritis of left knee 12/31/2016  . Asthma, mild intermittent 05/28/2016  . Other seasonal allergic rhinitis 07/26/2014  . Atypical squamous cells of undetermined significance (ASCUS) on Papanicolaou smear of cervix 01/31/2014  . BMI 40.0-44.9, adult ([place]) 11/15/2013  . Essential hypertension 09/19/2013  . Generalized osteoarthritis of multiple sites    Past Medical History:  Diagnosis Date  . Anemia   . Arthritis   . Asthma   . Constipation   . Diabetes mellitus without complication ([place])   . Dyspnea   . Hyperlipidemia   . Hypertension   . Insomnia   . Joint pain   . Knee pain   . Lactose intolerance   . Lower extremity edema   . Multiple food allergies   . Osteoarthritis   . Sinus problem     Family History  Problem Relation Age of Onset  . Heart attack Mother   . Heart disease Mother   . Sudden death Mother   . Obesity Mother   . Cancer Sister   . Breast cancer Sister 78  . Stroke Maternal Grandmother   . Sudden death Father     Past Surgical History:  Procedure Laterality Date  . BREAST BIOPSY Left 2010  . BREAST LUMPECTOMY Left 2010  . EYE SURGERY Bilateral    cataract surgery  .  TOTAL KNEE ARTHROPLASTY Left 03/10/2020   Procedure: LEFT TOTAL KNEE ARTHROPLASTY;  Surgeon: Leandrew Koyanagi, MD;  Location: [place];  Service: Orthopedics;  Laterality: Left;  . TUBAL LIGATION     Social History   Occupational History  . Not on file  Tobacco Use  . Smoking status: Never Smoker  . Smokeless tobacco: Never Used  Vaping Use  . Vaping Use: Never used  Substance and Sexual Activity  . Alcohol use: No  . Drug use: No  . Sexual activity: Not Currently    Birth control/protection: Surgical

## 2020-03-25 NOTE — Addendum Note (Signed)
Addended by: Precious Bard on: 03/25/2020 11:21 AM   Modules accepted: Orders

## 2020-03-26 ENCOUNTER — Encounter: Payer: Self-pay | Admitting: Physical Therapy

## 2020-03-26 ENCOUNTER — Ambulatory Visit: Payer: Medicare Other | Attending: Orthopaedic Surgery | Admitting: Physical Therapy

## 2020-03-26 DIAGNOSIS — M25662 Stiffness of left knee, not elsewhere classified: Secondary | ICD-10-CM | POA: Diagnosis present

## 2020-03-26 DIAGNOSIS — R600 Localized edema: Secondary | ICD-10-CM

## 2020-03-26 DIAGNOSIS — M6281 Muscle weakness (generalized): Secondary | ICD-10-CM | POA: Diagnosis present

## 2020-03-26 DIAGNOSIS — R262 Difficulty in walking, not elsewhere classified: Secondary | ICD-10-CM | POA: Diagnosis present

## 2020-03-26 NOTE — Therapy (Signed)
[place], [place], [address] Phone: [phone]   Fax:  [phone]  Physical Therapy Evaluation  Patient Details  Name: Penny Gibson MRN: [id number] Date of Birth: 1945-07-31 Referring Provider (PT): Frankey Shown, MD   Encounter Date: 03/26/2020   PT End of Session - 03/26/20 1154    Visit Number 1    Number of Visits 17    Date for PT Re-Evaluation 05/24/20    Authorization Type UHC MCR- PN visit 10, [place] visit 15    Progress Note Due on Visit 10    PT Start Time 1149    PT Stop Time 1240    PT Time Calculation (min) 51 min    Activity Tolerance Patient tolerated treatment well    Behavior During Therapy WFL for tasks assessed/performed           Past Medical History:  Diagnosis Date   Anemia    Arthritis    Asthma    Constipation    Diabetes mellitus without complication (HCC)    Dyspnea    Hyperlipidemia    Hypertension    Insomnia    Joint pain    Knee pain    Lactose intolerance    Lower extremity edema    Multiple food allergies    Osteoarthritis    Sinus problem     Past Surgical History:  Procedure Laterality Date   BREAST BIOPSY Left 2010   BREAST LUMPECTOMY Left 2010   EYE SURGERY Bilateral    cataract surgery   TOTAL KNEE ARTHROPLASTY Left 03/10/2020   Procedure: LEFT TOTAL KNEE ARTHROPLASTY;  Surgeon: Leandrew Koyanagi, MD;  Location: [place];  Service: Orthopedics;  Laterality: Left;   TUBAL LIGATION      There were no vitals filed for this visit.    Subjective Assessment - 03/26/20 1154    Subjective I cannot sleep in my own bed bc I cannot sleep on my back. Saw vascular surgeon yesterday to check for clots due to post knee pain. Just finished with HH PT yesterday. Reports feeling mildly depressed since surgery.    Patient Stated Goals household chores    Currently in Pain? Yes    Pain Score 4     Pain Location Knee      Pain Orientation Left    Pain Descriptors / Indicators Dull    Pain Type Surgical pain    Aggravating Factors  laying supine    Pain Relieving Factors ice, rest              [facility] PT Assessment - 03/26/20 0001      Assessment   Medical Diagnosis Lt Knee TKA    Referring Provider (PT) Frankey Shown, MD    Onset Date/Surgical Date 03/10/20    Hand Dominance Right    Prior Therapy home health      Precautions   Precautions None      Restrictions   Weight Bearing Restrictions No      Balance Screen   Has the patient fallen in the past 6 months No      [place] residence    Living Arrangements Alone    Additional Comments steps to enter home, no steps out back door      Prior Function   Level of Independence Independent      Cognition   Overall Cognitive Status Within Functional Limits for tasks assessed  Observation/Other Assessments   Focus on Therapeutic Outcomes (FOTO)  51% limited      Observation/Other Assessments-Edema    Edema Circumferential      Circumferential Edema   Circumferential - Right 45cm    Circumferential - Left  50cm   knee joint line     Sensation   Additional Comments WFL      ROM / Strength   AROM / PROM / Strength AROM      AROM   AROM Assessment Site Knee    Right/Left Knee Left    Left Knee Extension -10    Left Knee Flexion 90                      Objective measurements completed on examination: See above findings.       [facility] Adult PT Treatment/Exercise - 03/26/20 0001      Exercises   Exercises Knee/Hip      Knee/Hip Exercises: Stretches   Knee: Self-Stretch Limitations supine heel slides with strap      Knee/Hip Exercises: Supine   Quad Sets Limitations cues to alternate for learning    Straight Leg Raises Limitations quad set to SLR    Other Supine Knee/Hip Exercises ankle ABCs elevated      Modalities   Modalities Vasopneumatic      Vasopneumatic    Number Minutes Vasopneumatic  15 minutes   5 min with education/eval wrap up   Vasopnuematic Location  Knee    Vasopneumatic Pressure Low    Vasopneumatic Temperature  34                  PT Education - 03/26/20 1250    Education Details anatomy of condition, POC, HEP, exercise formr/ationale, FOTO    Person(s) Educated Patient    Methods Explanation;Demonstration;Tactile cues;Verbal cues;Handout    Comprehension Verbalized understanding;Returned demonstration;Verbal cues required;Tactile cues required            PT Short Term Goals - 03/26/20 1252      PT SHORT TERM GOAL #1   Title knee extension to 0 deg    Baseline -10    Time 4    Period Weeks    Status New    Target Date 04/23/20      PT SHORT TERM GOAL #2   Title compliant with ice/elevation    Baseline ice at home but sitting in recliner    Time 4    Period Weeks    Status New    Target Date 04/23/20             PT Long Term Goals - 03/26/20 1253      PT LONG TERM GOAL #1   Title knee ROM 0-120    Baseline see flowsheet    Time 8    Period Weeks    Status New    Target Date 05/24/20      PT LONG TERM GOAL #2   Title able to complete ADLs pain <=3/10    Baseline gets bad enought that she takes rest breaks    Time 8    Period Weeks    Status New    Target Date 05/24/20      PT LONG TERM GOAL #3   Title able to ambulate safely around her home without AD    Baseline using a quad cane at eval    Time 8    Period Weeks    Status New  Target Date 05/24/20      PT LONG TERM GOAL #4   Title able to squat and lift small objects from floor    Baseline very difficult and mostly unable at eval    Time 8    Period Weeks    Status New    Target Date 05/24/20                  Plan - 03/26/20 1202    Clinical Impression Statement pt presents to PT with complaints of limited functional mobility and pain s/p Lt TKA on 03/10/20. Limitations in ROM as expected with significant edema. Is  wearing thigh-high compression stockings. asked her to bring shoes that secure to her feet as slide on sandals can be dangerous with walking/standing activities. Reportedly has a hard time fitting shoes and does not have lace-up shoes so I asked her to bring some options with her and we will help don them. Pt will benefit from skilled PT to progress as appropriately post op and reach functional goals.    Personal Factors and Comorbidities Comorbidity 2;Time since onset of injury/illness/exacerbation    Comorbidities DM, anemia    Examination-Activity Limitations Bed Mobility;Sit;Carry;Squat;Stairs;Stand;Transfers;Locomotion Level    Examination-Participation Restrictions Meal Prep;Cleaning;Driving;Laundry;Yard Work    Stability/Clinical Decision Making Stable/Uncomplicated    Clinical Decision Making Low    Rehab Potential Good    PT Frequency 2x / week    PT Duration 8 weeks    PT Treatment/Interventions ADLs/Self Care Home Management;Cryotherapy;Electrical Stimulation;Gait training;Moist Heat;Stair training;Functional mobility training;Therapeutic activities;Therapeutic exercise;Balance training;Neuromuscular re-education;Manual techniques;Patient/family education;Scar mobilization;Passive range of motion;Taping;Vasopneumatic Device    PT Next Visit Plan continue with gross knee ROM, edema mob to post knee    PT Home Exercise Plan [id number]    Consulted and Agree with Plan of Care Patient           Patient will benefit from skilled therapeutic intervention in order to improve the following deficits and impairments:  Abnormal gait, Decreased range of motion, Difficulty walking, Increased muscle spasms, Pain, Decreased activity tolerance, Decreased balance, Decreased scar mobility, Decreased strength  Visit Diagnosis: Stiffness of left knee, not elsewhere classified - Plan: PT plan of care cert/re-cert  Difficulty in walking, not elsewhere classified - Plan: PT plan of care  cert/re-cert  Muscle weakness (generalized) - Plan: PT plan of care cert/re-cert  Localized edema - Plan: PT plan of care cert/re-cert     Problem List Patient Active Problem List   Diagnosis Date Noted   Status post total left knee replacement 03/10/2020   Cardiomegaly 06/27/2018   Iron deficiency anemia 07/19/2017   Primary osteoarthritis of left knee 12/31/2016   Asthma, mild intermittent 05/28/2016   Other seasonal allergic rhinitis 07/26/2014   Atypical squamous cells of undetermined significance (ASCUS) on Papanicolaou smear of cervix 01/31/2014   BMI 40.0-44.9, adult ([place]) 11/15/2013   Essential hypertension 09/19/2013   Generalized osteoarthritis of multiple sites    Ailsa Mireles C. Chaniya Genter PT, DPT 03/26/20 1:00 PM   [facility] [address] [place], [place], [address] Phone: [phone]   Fax:  [phone]  Name: Laurena Valko MRN: [id number] Date of Birth: 01/25/1946

## 2020-04-04 ENCOUNTER — Ambulatory Visit: Payer: Medicare Other

## 2020-04-04 ENCOUNTER — Other Ambulatory Visit: Payer: Self-pay

## 2020-04-10 ENCOUNTER — Other Ambulatory Visit (INDEPENDENT_AMBULATORY_CARE_PROVIDER_SITE_OTHER): Payer: Self-pay | Admitting: Physician Assistant

## 2020-04-10 DIAGNOSIS — E1169 Type 2 diabetes mellitus with other specified complication: Secondary | ICD-10-CM

## 2020-04-11 ENCOUNTER — Ambulatory Visit: Payer: Medicare Other | Admitting: Physical Therapy

## 2020-04-11 ENCOUNTER — Encounter: Payer: Self-pay | Admitting: Physical Therapy

## 2020-04-11 ENCOUNTER — Other Ambulatory Visit: Payer: Self-pay

## 2020-04-11 DIAGNOSIS — M25662 Stiffness of left knee, not elsewhere classified: Secondary | ICD-10-CM

## 2020-04-11 DIAGNOSIS — M6281 Muscle weakness (generalized): Secondary | ICD-10-CM

## 2020-04-11 DIAGNOSIS — R600 Localized edema: Secondary | ICD-10-CM

## 2020-04-11 DIAGNOSIS — R262 Difficulty in walking, not elsewhere classified: Secondary | ICD-10-CM

## 2020-04-11 NOTE — Therapy (Signed)
[place], [place], [address] Phone: [phone]   Fax:  [phone]  Physical Therapy Treatment  Patient Details  Name: Penny Gibson MRN: [id number] Date of Birth: [birth year] Referring Provider (PT): Frankey Shown, MD   Encounter Date: 04/11/2020   PT End of Session - 04/11/20 1106    Visit Number 2    Number of Visits 17    Date for PT Re-Evaluation 05/24/20    Authorization Type UHC MCR- PN visit 81, [place] visit 15    Progress Note Due on Visit 10    PT Start Time 1100    PT Stop Time 1145    PT Time Calculation (min) 45 min           Past Medical History:  Diagnosis Date  . Anemia   . Arthritis   . Asthma   . Constipation   . Diabetes mellitus without complication ([place])   . Dyspnea   . Hyperlipidemia   . Hypertension   . Insomnia   . Joint pain   . Knee pain   . Lactose intolerance   . Lower extremity edema   . Multiple food allergies   . Osteoarthritis   . Sinus problem     Past Surgical History:  Procedure Laterality Date  . BREAST BIOPSY Left 2010  . BREAST LUMPECTOMY Left 2010  . EYE SURGERY Bilateral    cataract surgery  . TOTAL KNEE ARTHROPLASTY Left 03/10/2020   Procedure: LEFT TOTAL KNEE ARTHROPLASTY;  Surgeon: Leandrew Koyanagi, MD;  Location: [place];  Service: Orthopedics;  Laterality: Left;  . TUBAL LIGATION      There were no vitals filed for this visit.   Subjective Assessment - 04/11/20 1105    Subjective Dull pain or stiffness mostly.    Currently in Pain? Yes    Pain Score 2     Pain Location Knee    Pain Orientation Left    Pain Descriptors / Indicators Dull    Pain Type Surgical pain    Aggravating Factors  laying supine, sitting too long    Pain Relieving Factors keep moving, ice , elevation                             [facility] Adult PT Treatment/Exercise - 04/11/20 0001      Knee/Hip Exercises: Stretches    Knee: Self-Stretch Limitations --      Knee/Hip Exercises: Aerobic   Nustep L3 x 5 minutes UE/LE      Knee/Hip Exercises: Seated   Long Arc Quad 20 reps    Hamstring Curl 20 reps    Hamstring Limitations red band       Knee/Hip Exercises: Supine   Quad Sets 15 reps    Quad Sets Limitations --    Heel Slides 15 reps    Bridges 10 reps    Straight Leg Raises Limitations quad set to SLR    Other Supine Knee/Hip Exercises supine marching       Vasopneumatic   Number Minutes Vasopneumatic  10 minutes    Vasopnuematic Location  Knee    Vasopneumatic Pressure Low    Vasopneumatic Temperature  34                    PT Short Term Goals - 03/26/20 1252      PT SHORT TERM GOAL #1   Title  knee extension to 0 deg    Baseline -10    Time 4    Period Weeks    Status New    Target Date 04/23/20      PT SHORT TERM GOAL #2   Title compliant with ice/elevation    Baseline ice at home but sitting in recliner    Time 4    Period Weeks    Status New    Target Date 04/23/20             PT Long Term Goals - 03/26/20 1253      PT LONG TERM GOAL #1   Title knee ROM 0-120    Baseline see flowsheet    Time 8    Period Weeks    Status New    Target Date 05/24/20      PT LONG TERM GOAL #2   Title able to complete ADLs pain <=3/10    Baseline gets bad enought that she takes rest breaks    Time 8    Period Weeks    Status New    Target Date 05/24/20      PT LONG TERM GOAL #3   Title able to ambulate safely around her home without AD    Baseline using a quad cane at eval    Time 8    Period Weeks    Status New    Target Date 05/24/20      PT LONG TERM GOAL #4   Title able to squat and lift small objects from floor    Baseline very difficult and mostly unable at eval    Time 8    Period Weeks    Status New    Target Date 05/24/20                 Plan - 04/11/20 1147    Clinical Impression Statement Pt reports 2/10 and overall improvement since  surgery. Began Nustep. Pt has improved knee flexion from 90-101 degrees since last session. Reviewed HEP and repeated Vaso for edema.    PT Next Visit Plan continue with gross knee ROM, edema mob to post knee    PT Home Exercise Plan [id number]           Patient will benefit from skilled therapeutic intervention in order to improve the following deficits and impairments:  Abnormal gait, Decreased range of motion, Difficulty walking, Increased muscle spasms, Pain, Decreased activity tolerance, Decreased balance, Decreased scar mobility, Decreased strength  Visit Diagnosis: Stiffness of left knee, not elsewhere classified  Difficulty in walking, not elsewhere classified  Muscle weakness (generalized)  Localized edema     Problem List Patient Active Problem List   Diagnosis Date Noted  . 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 Cardiomegaly 06/27/2018  . Iron deficiency anemia 07/19/2017  . Primary osteoarthritis of left knee 12/31/2016  . Asthma, mild intermittent 05/28/2016  . Other seasonal allergic rhinitis 07/26/2014  . Atypical squamous cells of undetermined significance (ASCUS) on Papanicolaou smear of cervix 01/31/2014  . BMI 40.0-44.9, adult ([place]) 11/15/2013  . Essential hypertension 09/19/2013  . Generalized osteoarthritis of multiple sites     [name], [place] 04/11/2020, 12:31 PM  [facility] [address] [place], [place], [address] Phone: [phone]   Fax:  [phone]  Name: Penny Gibson MRN: [id number] Date of Birth: December 08, 1945

## 2020-04-14 ENCOUNTER — Other Ambulatory Visit: Payer: Self-pay

## 2020-04-14 ENCOUNTER — Encounter: Payer: Self-pay | Admitting: Physical Therapy

## 2020-04-14 ENCOUNTER — Ambulatory Visit: Payer: Medicare Other | Admitting: Physical Therapy

## 2020-04-14 DIAGNOSIS — M6281 Muscle weakness (generalized): Secondary | ICD-10-CM

## 2020-04-14 DIAGNOSIS — M25662 Stiffness of left knee, not elsewhere classified: Secondary | ICD-10-CM

## 2020-04-14 DIAGNOSIS — R600 Localized edema: Secondary | ICD-10-CM

## 2020-04-14 DIAGNOSIS — R262 Difficulty in walking, not elsewhere classified: Secondary | ICD-10-CM

## 2020-04-14 NOTE — Therapy (Signed)
[place], [place], [address] Phone: [phone]   Fax:  [phone]  Physical Therapy Treatment  Patient Details  Name: Penny Gibson MRN: [id number] Date of Birth: April 04, 1946 Referring Provider (PT): Frankey Shown, MD   Encounter Date: 04/14/2020   PT End of Session - 04/14/20 1108    Visit Number 2    Number of Visits 17    Date for PT Re-Evaluation 05/24/20    Authorization Type UHC MCR- PN visit 54, KX visit 15    Progress Note Due on Visit 10    PT Start Time 1102    PT Stop Time 1153    PT Time Calculation (min) 51 min           Past Medical History:  Diagnosis Date  . Anemia   . Arthritis   . Asthma   . Constipation   . Diabetes mellitus without complication ([place])   . Dyspnea   . Hyperlipidemia   . Hypertension   . Insomnia   . Joint pain   . Knee pain   . Lactose intolerance   . Lower extremity edema   . Multiple food allergies   . Osteoarthritis   . Sinus problem     Past Surgical History:  Procedure Laterality Date  . BREAST BIOPSY Left 2010  . BREAST LUMPECTOMY Left 2010  . EYE SURGERY Bilateral    cataract surgery  . TOTAL KNEE ARTHROPLASTY Left 03/10/2020   Procedure: LEFT TOTAL KNEE ARTHROPLASTY;  Surgeon: Leandrew Koyanagi, MD;  Location: [facility];  Service: Orthopedics;  Laterality: Left;  . TUBAL LIGATION      There were no vitals filed for this visit.   Subjective Assessment - 04/14/20 1107    Subjective Pain has been about a 5/10 all weekend, both knees.    Currently in Pain? Yes    Pain Score 5     Pain Location Knee    Pain Orientation Right;Left    Pain Descriptors / Indicators Dull    Pain Type Surgical pain    Aggravating Factors  seemed to hurt more after exercises at PT and at home.    Pain Relieving Factors rest, ice              [facility] PT Assessment - 04/14/20 0001      AROM   Left Knee Extension -5    Left Knee  Flexion 109                         [facility] Adult PT Treatment/Exercise - 04/14/20 0001      Knee/Hip Exercises: Aerobic   Nustep L3 x 5 minutes UE/LE      Knee/Hip Exercises: Seated   Long Arc Quad 20 reps    Long Arc Quad Weight 2 lbs.    Long Arc Quad Limitations 5 sec holds     Hamstring Curl 20 reps    Hamstring Limitations red band     Sit to [employer] 10 reps;2 sets      Knee/Hip Exercises: Supine   Quad Sets 15 reps    Heel Slides 15 reps    Bridges 10 reps    Straight Leg Raises Limitations quad set to SLR    Other Supine Knee/Hip Exercises --      Vasopneumatic   Number Minutes Vasopneumatic  10 minutes    Vasopnuematic Location  Knee    Vasopneumatic  Pressure Low    Vasopneumatic Temperature  34                    PT Short Term Goals - 03/26/20 1252      PT SHORT TERM GOAL #1   Title knee extension to 0 deg    Baseline -10    Time 4    Period Weeks    Status New    Target Date 04/23/20      PT SHORT TERM GOAL #2   Title compliant with ice/elevation    Baseline ice at home but sitting in recliner    Time 4    Period Weeks    Status New    Target Date 04/23/20             PT Long Term Goals - 03/26/20 1253      PT LONG TERM GOAL #1   Title knee ROM 0-120    Baseline see flowsheet    Time 8    Period Weeks    Status New    Target Date 05/24/20      PT LONG TERM GOAL #2   Title able to complete ADLs pain <=3/10    Baseline gets bad enought that she takes rest breaks    Time 8    Period Weeks    Status New    Target Date 05/24/20      PT LONG TERM GOAL #3   Title able to ambulate safely around her home without AD    Baseline using a quad cane at eval    Time 8    Period Weeks    Status New    Target Date 05/24/20      PT LONG TERM GOAL #4   Title able to squat and lift small objects from floor    Baseline very difficult and mostly unable at eval    Time 8    Period Weeks    Status New    Target Date 05/24/20                  Plan - 04/14/20 1222    Clinical Impression Statement Pt arrives without her quad cane. She reports increased pain in both knees over the weekend. She noted some increased edema after PT. Her AROM has significantly improved for 10-90 to 5-109. Continued with AROM and strengthening. Began sit-stands. She does become SOB on Nustep, with sit-stands and with being supine for prolonged ttime.    PT Next Visit Plan continue with gross knee ROM, edema mob to post knee    PT Home Exercise Plan [id number]           Patient will benefit from skilled therapeutic intervention in order to improve the following deficits and impairments:  Abnormal gait, Decreased range of motion, Difficulty walking, Increased muscle spasms, Pain, Decreased activity tolerance, Decreased balance, Decreased scar mobility, Decreased strength  Visit Diagnosis: Stiffness of left knee, not elsewhere classified  Difficulty in walking, not elsewhere classified  Muscle weakness (generalized)  Localized edema     Problem List Patient Active Problem List   Diagnosis Date Noted  . 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 Cardiomegaly 06/27/2018  . Iron deficiency anemia 07/19/2017  . Primary osteoarthritis of left knee 12/31/2016  . Asthma, mild intermittent 05/28/2016  . Other seasonal allergic rhinitis 07/26/2014  . Atypical squamous cells of undetermined significance (ASCUS) on Papanicolaou smear of cervix 01/31/2014  . BMI  40.0-44.9, adult ([place]) 11/15/2013  . Essential hypertension 09/19/2013  . Generalized osteoarthritis of multiple sites     [name], [place] 04/14/2020, 12:24 PM  [facility] [address] [place], [place], [address] Phone: [phone]   Fax:  [phone]  Name: Cheryel Kyte MRN: [id number] Date of Birth: [birth year]

## 2020-04-17 ENCOUNTER — Other Ambulatory Visit: Payer: Self-pay

## 2020-04-17 ENCOUNTER — Ambulatory Visit: Payer: Medicare Other | Admitting: Physical Therapy

## 2020-04-17 ENCOUNTER — Encounter: Payer: Self-pay | Admitting: Physical Therapy

## 2020-04-17 DIAGNOSIS — M6281 Muscle weakness (generalized): Secondary | ICD-10-CM

## 2020-04-17 DIAGNOSIS — R262 Difficulty in walking, not elsewhere classified: Secondary | ICD-10-CM

## 2020-04-17 DIAGNOSIS — R600 Localized edema: Secondary | ICD-10-CM

## 2020-04-17 DIAGNOSIS — M25662 Stiffness of left knee, not elsewhere classified: Secondary | ICD-10-CM | POA: Diagnosis not present

## 2020-04-17 NOTE — Therapy (Signed)
[place], [place], [address] Phone: [phone]   Fax:  [phone]  Physical Therapy Treatment  Patient Details  Name: Penny Gibson MRN: [id number] Date of Birth: June 07, 1946 Referring Provider (PT): Frankey Shown, MD   Encounter Date: 04/17/2020   PT End of Session - 04/17/20 1210    Visit Number 3    Number of Visits 17    Date for PT Re-Evaluation 05/24/20    Authorization Type UHC MCR- PN visit 29, KX visit 15    Progress Note Due on Visit 10    PT Start Time 7322    PT Stop Time 1240    PT Time Calculation (min) 55 min           Past Medical History:  Diagnosis Date  . Anemia   . Arthritis   . Asthma   . Constipation   . Diabetes mellitus without complication ([place])   . Dyspnea   . Hyperlipidemia   . Hypertension   . Insomnia   . Joint pain   . Knee pain   . Lactose intolerance   . Lower extremity edema   . Multiple food allergies   . Osteoarthritis   . Sinus problem     Past Surgical History:  Procedure Laterality Date  . BREAST BIOPSY Left 2010  . BREAST LUMPECTOMY Left 2010  . EYE SURGERY Bilateral    cataract surgery  . TOTAL KNEE ARTHROPLASTY Left 03/10/2020   Procedure: LEFT TOTAL KNEE ARTHROPLASTY;  Surgeon: Leandrew Koyanagi, MD;  Location: [facility];  Service: Orthopedics;  Laterality: Left;  . TUBAL LIGATION      There were no vitals filed for this visit.       [facility] PT Assessment - 04/17/20 0001      AROM   Left Knee Flexion 110                         [facility] Adult PT Treatment/Exercise - 04/17/20 0001      Knee/Hip Exercises: Aerobic   Nustep L5 x 5 minutes UE/LE   HR 140 bpm SPO2 93%     Knee/Hip Exercises: Standing   Forward Step Up 10 reps;Hand Hold: 1;Step Height: 6"      Knee/Hip Exercises: Seated   Long Arc Quad 20 reps    Long Arc Quad Weight 3 lbs.    Long [employer] Limitations 5 sec holds     Hamstring  Curl 20 reps    Hamstring Limitations red band     Sit to [employer] 2 sets;10 reps      Knee/Hip Exercises: Supine   Quad Sets 15 reps    Heel Slides 15 reps    Bridges 10 reps    Straight Leg Raises Limitations quad set to [employer]      Knee/Hip Exercises: Sidelying   Hip ABduction 15 reps      Vasopneumatic   Number Minutes Vasopneumatic  10 minutes    Vasopnuematic Location  Knee    Vasopneumatic Pressure Low    Vasopneumatic Temperature  34                    PT Short Term Goals - 04/17/20 1217      PT SHORT TERM GOAL #1   Title knee extension to 0 deg    Baseline -5    Time 4    Period Weeks  Status Achieved      PT SHORT TERM GOAL #2   Title compliant with ice/elevation    Time 4    Period Weeks    Status Achieved             PT Long Term Goals - 03/26/20 1253      PT LONG TERM GOAL #1   Title knee ROM 0-120    Baseline see flowsheet    Time 8    Period Weeks    Status New    Target Date 05/24/20      PT LONG TERM GOAL #2   Title able to complete ADLs pain <=3/10    Baseline gets bad enought that she takes rest breaks    Time 8    Period Weeks    Status New    Target Date 05/24/20      PT LONG TERM GOAL #3   Title able to ambulate safely around her home without AD    Baseline using a quad cane at eval    Time 8    Period Weeks    Status New    Target Date 05/24/20      PT LONG TERM GOAL #4   Title able to squat and lift small objects from floor    Baseline very difficult and mostly unable at eval    Time 8    Period Weeks    Status New    Target Date 05/24/20                 Plan - 04/17/20 1214    Clinical Impression Statement Increased resistance on Nustep, noted patient SOB, vitals 93% and 140 bpm. After rest HR 94 and SP02 99%. Continued with ROM and strength while monitoring Vitals. Began step ups with good tolerance.    PT Next Visit Plan continue with gross knee ROM, edema mob to post knee; monitor vitals -(HR  increased to 142 on nustep)    PT Home Exercise Plan [email or web address]           Patient will benefit from skilled therapeutic intervention in order to improve the following deficits and impairments:  Abnormal gait, Decreased range of motion, Difficulty walking, Increased muscle spasms, Pain, Decreased activity tolerance, Decreased balance, Decreased scar mobility, Decreased strength  Visit Diagnosis: Stiffness of left knee, not elsewhere classified  Difficulty in walking, not elsewhere classified  Muscle weakness (generalized)  Localized edema     Problem List Patient Active Problem List   Diagnosis Date Noted  . 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 Cardiomegaly 06/27/2018  . Iron deficiency anemia 07/19/2017  . Primary osteoarthritis of left knee 12/31/2016  . Asthma, mild intermittent 05/28/2016  . Other seasonal allergic rhinitis 07/26/2014  . Atypical squamous cells of undetermined significance (ASCUS) on Papanicolaou smear of cervix 01/31/2014  . BMI 40.0-44.9, adult ([place]) 11/15/2013  . Essential hypertension 09/19/2013  . Generalized osteoarthritis of multiple sites     Penny Gibson, [place] 04/17/2020, 12:33 PM  [facility] [address] [place], [place], [address] Phone: [phone]   Fax:  [phone]  Name: Penny Gibson MRN: [id number] Date of Birth: 1946-04-25

## 2020-04-21 ENCOUNTER — Ambulatory Visit: Payer: Medicare Other | Admitting: Physical Therapy

## 2020-04-22 ENCOUNTER — Ambulatory Visit (INDEPENDENT_AMBULATORY_CARE_PROVIDER_SITE_OTHER): Payer: Medicare Other | Admitting: Orthopaedic Surgery

## 2020-04-22 ENCOUNTER — Ambulatory Visit (INDEPENDENT_AMBULATORY_CARE_PROVIDER_SITE_OTHER): Payer: Medicare Other

## 2020-04-22 ENCOUNTER — Encounter: Payer: Self-pay | Admitting: Orthopaedic Surgery

## 2020-04-22 DIAGNOSIS — Z96652 Presence of left artificial knee joint: Secondary | ICD-10-CM

## 2020-04-22 NOTE — Progress Notes (Signed)
Post-Op Visit Note   Patient: Penny Gibson           Date of Birth: 10-03-1945           MRN: [id number] Visit Date: 04/22/2020 PCP: Flossie Buffy, NP   Assessment & Plan:  Chief Complaint:  Chief Complaint  Patient presents with  . Left Knee - Pain   Visit Diagnoses:  1. Hx of total knee replacement, left     Plan: Patient is a pleasant 74 year old female who comes in today 6 weeks out left total knee replacement.  She has been doing well.  She has had mild pain which is relieved with over-the-counter NSAIDs.  She is still in physical therapy making great progress.  Examination of her left knee reveals a fully healed surgical scar without complication.  Calf soft nontender.  Range of motion 0 to 120 degrees.  Stable valgus varus stress.  She is neurovascular intact distally.  At this point, she will continue to advance with activity as tolerated.  Dental prophylaxis reinforced.  Follow-up with [place] in 6 weeks time for recheck.  Follow-Up Instructions: Return in about 6 weeks (around 06/03/2020).   Orders:  Orders Placed This Encounter  Procedures  . XR Knee 1-2 Views Left   No orders of the defined types were placed in this encounter.   Imaging: XR Knee 1-2 Views Left  Result Date: 04/22/2020 Well-seated prosthesis without complication   PMFS History: Patient Active Problem List   Diagnosis Date Noted  . 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 Cardiomegaly 06/27/2018  . Iron deficiency anemia 07/19/2017  . Primary osteoarthritis of left knee 12/31/2016  . Asthma, mild intermittent 05/28/2016  . Other seasonal allergic rhinitis 07/26/2014  . Atypical squamous cells of undetermined significance (ASCUS) on Papanicolaou smear of cervix 01/31/2014  . BMI 40.0-44.9, adult ([place]) 11/15/2013  . Essential hypertension 09/19/2013  . Generalized osteoarthritis of multiple sites    Past Medical History:  Diagnosis Date  . Anemia   .  Arthritis   . Asthma   . Constipation   . Diabetes mellitus without complication ([place])   . Dyspnea   . Hyperlipidemia   . Hypertension   . Insomnia   . Joint pain   . Knee pain   . Lactose intolerance   . Lower extremity edema   . Multiple food allergies   . Osteoarthritis   . Sinus problem     Family History  Problem Relation Age of Onset  . Heart attack Mother   . Heart disease Mother   . Sudden death Mother   . Obesity Mother   . Cancer Sister   . Breast cancer Sister 70  . Stroke Maternal Grandmother   . Sudden death Father     Past Surgical History:  Procedure Laterality Date  . BREAST BIOPSY Left 2010  . BREAST LUMPECTOMY Left 2010  . EYE SURGERY Bilateral    cataract surgery  . TOTAL KNEE ARTHROPLASTY Left 03/10/2020   Procedure: LEFT TOTAL KNEE ARTHROPLASTY;  Surgeon: Leandrew Koyanagi, MD;  Location: [facility];  Service: Orthopedics;  Laterality: Left;  . TUBAL LIGATION     Social History   Occupational History  . Not on file  Tobacco Use  . Smoking status: Never Smoker  . Smokeless tobacco: Never Used  Vaping Use  . Vaping Use: Never used  Substance and Sexual Activity  . Alcohol use: No  . Drug use: No  .  Sexual activity: Not Currently    Birth control/protection: Surgical

## 2020-04-25 ENCOUNTER — Ambulatory Visit: Payer: Medicare Other | Attending: Orthopaedic Surgery | Admitting: Physical Therapy

## 2020-04-25 ENCOUNTER — Other Ambulatory Visit: Payer: Self-pay

## 2020-04-25 ENCOUNTER — Encounter: Payer: Self-pay | Admitting: Physical Therapy

## 2020-04-25 DIAGNOSIS — R262 Difficulty in walking, not elsewhere classified: Secondary | ICD-10-CM | POA: Insufficient documentation

## 2020-04-25 DIAGNOSIS — M25662 Stiffness of left knee, not elsewhere classified: Secondary | ICD-10-CM

## 2020-04-25 DIAGNOSIS — M6281 Muscle weakness (generalized): Secondary | ICD-10-CM | POA: Diagnosis not present

## 2020-04-25 DIAGNOSIS — R600 Localized edema: Secondary | ICD-10-CM | POA: Insufficient documentation

## 2020-04-25 NOTE — Patient Instructions (Addendum)
      WALKING  Walking is a great form of exercise to increase your strength, endurance and overall fitness.  A walking program can help you start slowly and gradually build endurance as you go.  Everyone's ability is different, so each person's starting point will be different.  You do not have to follow them exactly.  The are just samples. You should simply find out what's right for you and stick to that program.   In the beginning, you'll start off walking 2-3 times a day for short distances.  As you get stronger, you'll be walking further at just 1-2 times per day.  Walk for 150 to 300 minutes a week.  That  Is 30 to 60 minutes 5 x a week recommended by the American Medical Association.  A. You Can Walk For A Certain Length Of Time Each Day    Walk 5 minutes 3 times per day.  Increase 2 minutes every 2 days (3 times per day).  Work up to 25-30 minutes (1-2 times per day).   Example:   Day 1-2 5 minutes 3 times per day   Day 7-8 12 minutes 2-3 times per day   Day 13-14 25 minutes 1-2 times per day  This a good place to start [place]  B. You Can Walk For a Certain Distance Each Day     Distance can be substituted for time.    Example:   3 trips to mailbox (at road)   3 trips to corner of block   3 trips around the block  C. Go to local high school and use the track.    Walk for distance ____ around track  Or time ____ minutes  D. Walk x____ Jog ____ Run ___  Please only do the exercises that your therapist has initialed and dated Voncille Lo, PT, [facility] Certified Exercise Expert for the Aging Adult  04/25/20 11:27 AM Phone: [phone] Fax: [phone]

## 2020-04-25 NOTE — Therapy (Signed)
[place], [place], [address] Phone: [phone]   Fax:  [phone]  Physical Therapy Treatment  Patient Details  Name: Penny Gibson MRN: [id number] Date of Birth: 23-Jan-1946 Referring Provider (PT): Frankey Shown, MD   Encounter Date: 04/25/2020   PT End of Session - 04/25/20 1103    Visit Number 4    Number of Visits 17    Date for PT Re-Evaluation 05/24/20    Authorization Type UHC MCR- PN visit 10, KX visit 15    PT Start Time 1103    PT Stop Time 1150    PT Time Calculation (min) 47 min    Activity Tolerance Patient tolerated treatment well    Behavior During Therapy [facility] for tasks assessed/performed           Past Medical History:  Diagnosis Date  . Anemia   . Arthritis   . Asthma   . Constipation   . Diabetes mellitus without complication ([place])   . Dyspnea   . Hyperlipidemia   . Hypertension   . Insomnia   . Joint pain   . Knee pain   . Lactose intolerance   . Lower extremity edema   . Multiple food allergies   . Osteoarthritis   . Sinus problem     Past Surgical History:  Procedure Laterality Date  . BREAST BIOPSY Left 2010  . BREAST LUMPECTOMY Left 2010  . EYE SURGERY Bilateral    cataract surgery  . TOTAL KNEE ARTHROPLASTY Left 03/10/2020   Procedure: LEFT TOTAL KNEE ARTHROPLASTY;  Surgeon: Leandrew Koyanagi, MD;  Location: [facility];  Service: Orthopedics;  Laterality: Left;  . TUBAL LIGATION      There were no vitals filed for this visit.   Subjective Assessment - 04/25/20 1107    Subjective Pain has been stiff and no real pain    Currently in Pain? No/denies    Pain Score 0-No pain    Pain Location Knee    Pain Orientation Right;Left   LT knee surgical   Pain Descriptors / Indicators Dull    Pain Type Surgical pain              [facility] PT Assessment - 04/25/20 0001      AROM   Left Knee Extension -4    Left Knee Flexion 115   PROM  120                        [facility] Adult PT Treatment/Exercise - 04/25/20 0001      Self-Care   Self-Care Other Self-Care Comments    Other Self-Care Comments  walking program and starting with movement 3 times a day for 5 minutes      Knee/Hip Exercises: Aerobic   Nustep L3 x 5 minutes UE/LE   HR 111 bpm SPO2 89%at L5, then L3 99% 135     Knee/Hip Exercises: Standing   Terminal Knee Extension Strengthening;Theraband;2 sets;10 reps    Theraband Level (Terminal Knee Extension) Level 2 (Red)    Terminal Knee Extension Limitations also used ball x 10 on LT knee    Forward Step Up 10 reps;Hand Hold: 1;Step Height: 6"    Other Standing Knee Exercises standing knee flexion stretch to hamstring stretch   runner's dynamic stretch      Knee/Hip Exercises: Seated   Sit to Sand 2 sets;10 reps      Knee/Hip Exercises: Supine  Quad Sets 20 reps    Heel Slides 15 reps    Heel Slides Limitations with green strap    Bridges 10 reps      Knee/Hip Exercises: Sidelying   Hip ABduction 15 reps      Manual Therapy   Manual Therapy Joint mobilization    Joint Mobilization patellar mobs of LT knee                    PT Short Term Goals - 04/17/20 1217      PT SHORT TERM GOAL #1   Title knee extension to 0 deg    Baseline -5    Time 4    Period Weeks    Status Achieved      PT SHORT TERM GOAL #2   Title compliant with ice/elevation    Time 4    Period Weeks    Status Achieved             PT Long Term Goals - 04/25/20 1146      PT LONG TERM GOAL #1   Title knee ROM 0-120    Baseline see flowsheet  115 LT    Time 8    Period Weeks    Status On-going      PT LONG TERM GOAL #2   Title able to complete ADLs pain <=3/10    Baseline No trouble dressing now    Time 8    Period Weeks    Status Achieved      PT LONG TERM GOAL #3   Title able to ambulate safely around her home without AD    Baseline no longer uses a cane or any AD    Time 8    Period  Weeks    Status Achieved      PT LONG TERM GOAL #4   Title able to squat and lift small objects from floor    Baseline not able to squat to floor  just added squats for exercise to HEP    Time 8    Period Weeks    Status On-going           Access Code: [email or web address]: [email or web address]: 11/05/2021Prepared by: Donnetta Simpers BeardsleyExercises   Standing Terminal Knee Extension with Resistance - 1 x daily - 7 x weekly - 3 sets - 10 reps  Squat with Chair and Counter Support - 1 x daily - 7 x weekly - 3 sets - 10 reps        Plan - 04/25/20 1107    Clinical Impression Statement Increeased resistance on Nustep at level 5 89 spO2% 119 bpm,   then level 3 vitals at 99% and 135 after 5 min.  Pt able to achieve LTG # 2 and #3 , able to perform ADL's and walk without a cane.  Pt with slight antalgic gait due to decreased Rt leg extension -5.  RT knee flex improved to 115 , PROM 120.   Pt educated  on importance of walking for time and given home walking program  and added to HEP.  Pt is doing well and on target to completing POC goals    Personal Factors and Comorbidities Comorbidity 2;Time since onset of injury/illness/exacerbation    Comorbidities DM, anemia    Examination-Activity Limitations Bed Mobility;Sit;Carry;Squat;Stairs;Stand;Transfers;Locomotion Level    Examination-Participation Restrictions Meal Prep;Cleaning;Driving;Laundry;[place] [employer] Work    PT Treatment/Interventions ADLs/Self Care Home Management;Cryotherapy;Electrical Stimulation;Gait training;Moist Heat;Stair training;Functional mobility training;Therapeutic activities;Therapeutic exercise;Balance training;Neuromuscular re-education;Manual techniques;Patient/family education;Scar mobilization;Passive range  of motion;Taping;Vasopneumatic Device    PT Next Visit Plan continue with gross knee ROM, edema mob to post knee; monitor vitals -(HR increased to 142 on nustep) work on knee ext, standing TKE    PT Home Exercise  Plan [email or web address]           Patient will benefit from skilled therapeutic intervention in order to improve the following deficits and impairments:  Abnormal gait, Decreased range of motion, Difficulty walking, Increased muscle spasms, Pain, Decreased activity tolerance, Decreased balance, Decreased scar mobility, Decreased strength  Visit Diagnosis: Stiffness of left knee, not elsewhere classified  Difficulty in walking, not elsewhere classified  Muscle weakness (generalized)  Localized edema     Problem List Patient Active Problem List   Diagnosis Date Noted  . 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 Cardiomegaly 06/27/2018  . Iron deficiency anemia 07/19/2017  . Primary osteoarthritis of left knee 12/31/2016  . Asthma, mild intermittent 05/28/2016  . Other seasonal allergic rhinitis 07/26/2014  . Atypical squamous cells of undetermined significance (ASCUS) on Papanicolaou smear of cervix 01/31/2014  . BMI 40.0-44.9, adult ([place]) 11/15/2013  . Essential hypertension 09/19/2013  . Generalized osteoarthritis of multiple sites     Voncille Lo, PT, [facility] Certified Exercise Expert for the Aging Adult  04/25/20 12:01 PM Phone: [phone] Fax: [facility] [address] [place], [place], [address] Phone: [phone]   Fax:  [phone]  Name: Penny Gibson MRN: [id number] Date of Birth: 1945-06-22

## 2020-04-29 ENCOUNTER — Ambulatory Visit: Payer: Medicare Other | Admitting: Physical Therapy

## 2020-04-29 ENCOUNTER — Other Ambulatory Visit: Payer: Self-pay

## 2020-04-29 DIAGNOSIS — R600 Localized edema: Secondary | ICD-10-CM | POA: Diagnosis not present

## 2020-04-29 DIAGNOSIS — R262 Difficulty in walking, not elsewhere classified: Secondary | ICD-10-CM | POA: Diagnosis not present

## 2020-04-29 DIAGNOSIS — M6281 Muscle weakness (generalized): Secondary | ICD-10-CM | POA: Diagnosis not present

## 2020-04-29 DIAGNOSIS — M25662 Stiffness of left knee, not elsewhere classified: Secondary | ICD-10-CM | POA: Diagnosis not present

## 2020-04-29 NOTE — Therapy (Signed)
[place], [place], [address] Phone: [phone]   Fax:  [phone]  Physical Therapy Treatment  Patient Details  Name: Penny Gibson MRN: [id number] Date of Birth: 26-Jun-1945 Referring Provider (PT): Frankey Shown, MD   Encounter Date: 04/29/2020   PT End of Session - 04/29/20 1154    Visit Number 5    Number of Visits 17    Date for PT Re-Evaluation 05/24/20    Authorization Type UHC MCR- PN visit 10, [place] visit 15    Progress Note Due on Visit 10    PT Start Time 1149   pt arrived late   PT Stop Time 1228    PT Time Calculation (min) 39 min    Activity Tolerance Patient tolerated treatment well    Behavior During Therapy [facility] for tasks assessed/performed           Past Medical History:  Diagnosis Date  . Anemia   . Arthritis   . Asthma   . Constipation   . Diabetes mellitus without complication ([place])   . Dyspnea   . Hyperlipidemia   . Hypertension   . Insomnia   . Joint pain   . Knee pain   . Lactose intolerance   . Lower extremity edema   . Multiple food allergies   . Osteoarthritis   . Sinus problem     Past Surgical History:  Procedure Laterality Date  . BREAST BIOPSY Left 2010  . BREAST LUMPECTOMY Left 2010  . EYE SURGERY Bilateral    cataract surgery  . TOTAL KNEE ARTHROPLASTY Left 03/10/2020   Procedure: LEFT TOTAL KNEE ARTHROPLASTY;  Surgeon: Leandrew Koyanagi, MD;  Location: [facility];  Service: Orthopedics;  Laterality: Left;  . TUBAL LIGATION      There were no vitals filed for this visit.   Subjective Assessment - 04/29/20 1153    Subjective No stiffness and no pain. not wearing compression stockings any more. walking all day, every day- about 10 min at a time.    Currently in Pain? No/denies                             [facility] Adult PT Treatment/Exercise - 04/29/20 0001      Knee/Hip Exercises: Stretches   Passive Hamstring  Stretch Both;30 seconds    Passive Hamstring Stretch Limitations seated EOB    Gastroc Stretch Both;30 seconds    Gastroc Stretch Limitations standing lunge stretch      Knee/Hip Exercises: Aerobic   Recumbent Bike 5 min L1      Knee/Hip Exercises: Standing   Heel Raises Both;20 reps    SLS with mini squats for TKE strength    Gait Training adding trunk rotation and arm swing      Knee/Hip Exercises: Seated   Long Arc Quad 10 reps    Long Arc Quad Limitations 3s holds- upright posture      Knee/Hip Exercises: Sidelying   Hip ABduction Both;15 reps    Hip ABduction Limitations arcs                    PT Short Term Goals - 04/17/20 1217      PT SHORT TERM GOAL #1   Title knee extension to 0 deg    Baseline -5    Time 4    Period Weeks    Status Achieved  PT SHORT TERM GOAL #2   Title compliant with ice/elevation    Time 4    Period Weeks    Status Achieved             PT Long Term Goals - 04/25/20 1146      PT LONG TERM GOAL #1   Title knee ROM 0-120    Baseline see flowsheet  115 LT    Time 8    Period Weeks    Status On-going      PT LONG TERM GOAL #2   Title able to complete ADLs pain <=3/10    Baseline No trouble dressing now    Time 8    Period Weeks    Status Achieved      PT LONG TERM GOAL #3   Title able to ambulate safely around her home without AD    Baseline no longer uses a cane or any AD    Time 8    Period Weeks    Status Achieved      PT LONG TERM GOAL #4   Title able to squat and lift small objects from floor    Baseline not able to squat to floor  just added squats for exercise to HEP    Time 8    Period Weeks    Status On-going                 Plan - 04/29/20 1503    Clinical Impression Statement Pt is doing very well and feels her function is improving significantly so we decreased further POC to 1/week until d/c. Asked her to pay attention to ADLs to see if there is anything else she would like to work  on.    PT Treatment/Interventions ADLs/Self Care Home Management;Cryotherapy;Electrical Stimulation;Gait training;Moist Heat;Stair training;Functional mobility training;Therapeutic activities;Therapeutic exercise;Balance training;Neuromuscular re-education;Manual techniques;Patient/family education;Scar mobilization;Passive range of motion;Taping;Vasopneumatic Device    PT Next Visit Plan monitor vitals, TKE strength, squats/reaching floor    PT Home Exercise Plan [email or web address]    Consulted and Agree with Plan of Care Patient           Patient will benefit from skilled therapeutic intervention in order to improve the following deficits and impairments:  Abnormal gait, Decreased range of motion, Difficulty walking, Increased muscle spasms, Pain, Decreased activity tolerance, Decreased balance, Decreased scar mobility, Decreased strength  Visit Diagnosis: Stiffness of left knee, not elsewhere classified  Difficulty in walking, not elsewhere classified  Muscle weakness (generalized)  Localized edema     Problem List Patient Active Problem List   Diagnosis Date Noted  . 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 Cardiomegaly 06/27/2018  . Iron deficiency anemia 07/19/2017  . Primary osteoarthritis of left knee 12/31/2016  . Asthma, mild intermittent 05/28/2016  . Other seasonal allergic rhinitis 07/26/2014  . Atypical squamous cells of undetermined significance (ASCUS) on Papanicolaou smear of cervix 01/31/2014  . BMI 40.0-44.9, adult ([place]) 11/15/2013  . Essential hypertension 09/19/2013  . Generalized osteoarthritis of multiple sites     Abbee Cremeens C. Gloriann Riede PT, DPT 04/29/20 3:05 PM   [facility] [address] [place], [place], [address] Phone: [phone]   Fax:  [phone]  Name: Penny Gibson MRN: [id number] Date of Birth: 02-May-1946

## 2020-05-02 ENCOUNTER — Ambulatory Visit: Payer: Medicare Other | Admitting: Physical Therapy

## 2020-05-02 ENCOUNTER — Encounter: Payer: Self-pay | Admitting: Physical Therapy

## 2020-05-02 ENCOUNTER — Other Ambulatory Visit: Payer: Self-pay

## 2020-05-02 DIAGNOSIS — M25662 Stiffness of left knee, not elsewhere classified: Secondary | ICD-10-CM

## 2020-05-02 DIAGNOSIS — M6281 Muscle weakness (generalized): Secondary | ICD-10-CM | POA: Diagnosis not present

## 2020-05-02 DIAGNOSIS — R262 Difficulty in walking, not elsewhere classified: Secondary | ICD-10-CM | POA: Diagnosis not present

## 2020-05-02 DIAGNOSIS — R600 Localized edema: Secondary | ICD-10-CM | POA: Diagnosis not present

## 2020-05-02 NOTE — Therapy (Signed)
[place], [place], [address] Phone: [phone]   Fax:  [phone]  Physical Therapy Treatment  Patient Details  Name: Penny Gibson MRN: [id number] Date of Birth: Oct 10, 1945 Referring Provider (PT): Frankey Shown, MD   Encounter Date: 05/02/2020   PT End of Session - 05/02/20 1058    Visit Number 6    Number of Visits 17    Date for PT Re-Evaluation 05/24/20    Authorization Type U[employer] MCR- PN visit 10, [place] visit 15    Progress Note Due on Visit 10    PT Start Time 1100    PT Stop Time 1142    PT Time Calculation (min) 42 min    Activity Tolerance Patient tolerated treatment well    Behavior During Therapy [facility] for tasks assessed/performed           Past Medical History:  Diagnosis Date  . Anemia   . Arthritis   . Asthma   . Constipation   . Diabetes mellitus without complication ([place])   . Dyspnea   . Hyperlipidemia   . Hypertension   . Insomnia   . Joint pain   . Knee pain   . Lactose intolerance   . Lower extremity edema   . Multiple food allergies   . Osteoarthritis   . Sinus problem     Past Surgical History:  Procedure Laterality Date  . BREAST BIOPSY Left 2010  . BREAST LUMPECTOMY Left 2010  . EYE SURGERY Bilateral    cataract surgery  . TOTAL KNEE ARTHROPLASTY Left 03/10/2020   Procedure: LEFT TOTAL KNEE ARTHROPLASTY;  Surgeon: Leandrew Koyanagi, MD;  Location: [place];  Service: Orthopedics;  Laterality: Left;  . TUBAL LIGATION      There were no vitals filed for this visit.   Subjective Assessment - 05/02/20 1118    Subjective Some days both legs just hurt me a little more. I stood in line for about an hour to get my booster. Both knees are a little puffy on top of them.              [facility] PT Assessment - 05/02/20 0001      Observation/Other Assessments   Focus on Therapeutic Outcomes (FOTO)  65% ability                          [facility] Adult PT Treatment/Exercise - 05/02/20 0001      Knee/Hip Exercises: Stretches   Passive Hamstring Stretch Both;30 seconds    Passive Hamstring Stretch Limitations seated EOB    Gastroc Stretch Both;2 reps;30 seconds    Gastroc Stretch Limitations slant board      Knee/Hip Exercises: Aerobic   Nustep 5 min L5 Le only      Knee/Hip Exercises: Standing   Hip Abduction Both;20 reps;Knee straight    Functional Squat Limitations breaking up squat motion with back of chair for support      Knee/Hip Exercises: Seated   Other Seated Knee/Hip Exercises hip hinge      Knee/Hip Exercises: Supine   Bridges with Clamshell Both;2 sets;10 reps   green tband around knees   Straight Leg Raises Both;2 sets;10 reps    Straight Leg Raise with External Rotation Both;2 sets;10 reps      Knee/Hip Exercises: Sidelying   Clams both x 30      Manual Therapy   Manual Therapy Soft tissue mobilization  Soft tissue mobilization roller RT hip and thigh                    PT Short Term Goals - 04/17/20 1217      PT SHORT TERM GOAL #1   Title knee extension to 0 deg    Baseline -5    Time 4    Period Weeks    Status Achieved      PT SHORT TERM GOAL #2   Title compliant with ice/elevation    Time 4    Period Weeks    Status Achieved             PT Long Term Goals - 04/25/20 1146      PT LONG TERM GOAL #1   Title knee ROM 0-120    Baseline see flowsheet  115 LT    Time 8    Period Weeks    Status On-going      PT LONG TERM GOAL #2   Title able to complete ADLs pain <=3/10    Baseline No trouble dressing now    Time 8    Period Weeks    Status Achieved      PT LONG TERM GOAL #3   Title able to ambulate safely around her home without AD    Baseline no longer uses a cane or any AD    Time 8    Period Weeks    Status Achieved      PT LONG TERM GOAL #4   Title able to squat and lift small objects from floor    Baseline not able to  squat to floor  just added squats for exercise to HEP    Time 8    Period Weeks    Status On-going                 Plan - 05/02/20 1127    Clinical Impression Statement pt continues to tolerate challenges well with fatigue but no increase in pain. advanced HEP to increase challenge at home. Broke down motions of squatting to floor and pt will work on this at home. Used UE support today but will progress to no support.    PT Treatment/Interventions ADLs/Self Care Home Management;Cryotherapy;Electrical Stimulation;Gait training;Moist Heat;Stair training;Functional mobility training;Therapeutic activities;Therapeutic exercise;Balance training;Neuromuscular re-education;Manual techniques;Patient/family education;Scar mobilization;Passive range of motion;Taping;Vasopneumatic Device    PT Next Visit Plan monitor vitals, gross hip strength, cont squatting to floor    PT Home Exercise Plan [id number]    Consulted and Agree with Plan of Care Patient           Patient will benefit from skilled therapeutic intervention in order to improve the following deficits and impairments:  Abnormal gait, Decreased range of motion, Difficulty walking, Increased muscle spasms, Pain, Decreased activity tolerance, Decreased balance, Decreased scar mobility, Decreased strength  Visit Diagnosis: Stiffness of left knee, not elsewhere classified  Difficulty in walking, not elsewhere classified  Muscle weakness (generalized)     Problem List Patient Active Problem List   Diagnosis Date Noted  . 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 Cardiomegaly 06/27/2018  . Iron deficiency anemia 07/19/2017  . Primary osteoarthritis of left knee 12/31/2016  . Asthma, mild intermittent 05/28/2016  . Other seasonal allergic rhinitis 07/26/2014  . Atypical squamous cells of undetermined significance (ASCUS) on Papanicolaou smear of cervix 01/31/2014  . BMI 40.0-44.9, adult ([place]) 11/15/2013  . Essential  hypertension 09/19/2013  . Generalized osteoarthritis of multiple  sites     [place]. Cheril Slattery PT, DPT 05/02/20 11:43 AM   [facility] [facility] [address] [place], [place], [address] Phone: [phone]   Fax:  [phone]  Name: Penny Gibson MRN: [id number] Date of Birth: 1945/12/28

## 2020-05-06 ENCOUNTER — Encounter: Payer: Medicare Other | Admitting: Physical Therapy

## 2020-05-09 ENCOUNTER — Other Ambulatory Visit: Payer: Self-pay

## 2020-05-09 ENCOUNTER — Encounter: Payer: Self-pay | Admitting: Physical Therapy

## 2020-05-09 ENCOUNTER — Ambulatory Visit: Payer: Medicare Other | Admitting: Physical Therapy

## 2020-05-09 DIAGNOSIS — R262 Difficulty in walking, not elsewhere classified: Secondary | ICD-10-CM | POA: Diagnosis not present

## 2020-05-09 DIAGNOSIS — M6281 Muscle weakness (generalized): Secondary | ICD-10-CM | POA: Diagnosis not present

## 2020-05-09 DIAGNOSIS — M25662 Stiffness of left knee, not elsewhere classified: Secondary | ICD-10-CM | POA: Diagnosis not present

## 2020-05-09 DIAGNOSIS — R600 Localized edema: Secondary | ICD-10-CM | POA: Diagnosis not present

## 2020-05-09 NOTE — Therapy (Signed)
[place], [place], [address] Phone: [phone]   Fax:  [phone]  Physical Therapy Treatment  Patient Details  Name: Penny Gibson MRN: [id number] Date of Birth: 1946-03-15 Referring Provider (PT): Frankey Shown, MD   Encounter Date: 05/09/2020   PT End of Session - 05/09/20 1059    Visit Number 7    Number of Visits 17    Date for PT Re-Evaluation 05/24/20    Authorization Type UHC MCR- PN visit 10, [place] visit 15    Progress Note Due on Visit 10    PT Start Time 1104    PT Stop Time 1116    PT Time Calculation (min) 12 min    Activity Tolerance Patient tolerated treatment well    Behavior During Therapy WFL for tasks assessed/performed           Past Medical History:  Diagnosis Date   Anemia    Arthritis    Asthma    Constipation    Diabetes mellitus without complication ([facility])    Dyspnea    Hyperlipidemia    Hypertension    Insomnia    Joint pain    Knee pain    Lactose intolerance    Lower extremity edema    Multiple food allergies    Osteoarthritis    Sinus problem     Past Surgical History:  Procedure Laterality Date   BREAST BIOPSY Left 2010   BREAST LUMPECTOMY Left 2010   EYE SURGERY Bilateral    cataract surgery   TOTAL KNEE ARTHROPLASTY Left 03/10/2020   Procedure: LEFT TOTAL KNEE ARTHROPLASTY;  Surgeon: Leandrew Koyanagi, MD;  Location: [facility];  Service: Orthopedics;  Laterality: Left;   TUBAL LIGATION      There were no vitals filed for this visit.   Subjective Assessment - 05/09/20 1104    Subjective Knee is feeling fine, I am walking more down the street. Balance is feeling good. I am bending over and picking things up the right way.    Patient Stated Goals household chores    Currently in Pain? No/denies              [facility] PT Assessment - 05/09/20 0001      Assessment   Medical Diagnosis Lt Knee TKA     Referring Provider (PT) Frankey Shown, MD    Onset Date/Surgical Date 03/10/20      Precautions   Precautions None      Restrictions   Weight Bearing Restrictions No      Balance Screen   Has the patient fallen in the past 6 months No      [place] residence    Living Arrangements Alone      Cognition   Overall Cognitive Status Within Functional Limits for tasks assessed      Observation/Other Assessments   Focus on Therapeutic Outcomes (FOTO)  65% ability      ROM / Strength   AROM / PROM / Strength Strength      AROM   Left Knee Extension 0    Left Knee Flexion 115      Strength   Overall Strength Comments gross 5/5 Lt knee      Ambulation/Gait   Gait Comments [facility]  PT Education - 05/09/20 1121    Education Details importance of continued HEP, prehab.    Person(s) Educated Patient    Methods Explanation    Comprehension Verbalized understanding            PT Short Term Goals - 04/17/20 1217      PT SHORT TERM GOAL #1   Title knee extension to 0 deg    Baseline -5    Time 4    Period Weeks    Status Achieved      PT SHORT TERM GOAL #2   Title compliant with ice/elevation    Time 4    Period Weeks    Status Achieved             PT Long Term Goals - 05/09/20 1105      PT LONG TERM GOAL #1   Title knee ROM 0-120    Baseline 0-115    Status Partially Met      PT LONG TERM GOAL #2   Title able to complete ADLs pain <=3/10    Status Achieved      PT LONG TERM GOAL #3   Title able to ambulate safely around her home without AD    Status Achieved      PT LONG TERM GOAL #4   Title able to squat and lift small objects from floor    Status Achieved                 Plan - 05/09/20 1122    Clinical Impression Statement Pt has met her goals and is prepared to d/c to independent program. She reports she feels comfortable with her exercises and will  contact us with any further questions.    PT Treatment/Interventions ADLs/Self Care Home Management;Cryotherapy;Electrical Stimulation;Gait training;Moist Heat;Stair training;Functional mobility training;Therapeutic activities;Therapeutic exercise;Balance training;Neuromuscular re-education;Manual techniques;Patient/family education;Scar mobilization;Passive range of motion;Taping;Vasopneumatic Device    PT Home Exercise Plan [id number]    Consulted and Agree with Plan of Care Patient           Patient will benefit from skilled therapeutic intervention in order to improve the following deficits and impairments:  Abnormal gait, Decreased range of motion, Difficulty walking, Increased muscle spasms, Pain, Decreased activity tolerance, Decreased balance, Decreased scar mobility, Decreased strength  Visit Diagnosis: Stiffness of left knee, not elsewhere classified  Difficulty in walking, not elsewhere classified  Muscle weakness (generalized)     Problem List Patient Active Problem List   Diagnosis Date Noted   Status post total left knee replacement 03/10/2020   Cardiomegaly 06/27/2018   Iron deficiency anemia 07/19/2017   Primary osteoarthritis of left knee 12/31/2016   Asthma, mild intermittent 05/28/2016   Other seasonal allergic rhinitis 07/26/2014   Atypical squamous cells of undetermined significance (ASCUS) on Papanicolaou smear of cervix 01/31/2014   BMI 40.0-44.9, adult ([place]) 11/15/2013   Essential hypertension 09/19/2013   Generalized osteoarthritis of multiple sites    PHYSICAL THERAPY DISCHARGE SUMMARY  Visits from Start of Care: 7  Current functional level related to goals / functional outcomes: See above   Remaining deficits: See above   Education / Equipment: Anatomy of condition, POC, HEP, exercise form/rationale  Plan: Patient agrees to discharge.  Patient goals were met. Patient is being discharged due to meeting the stated rehab goals.  ?????      Anasofia Micallef C. Atwood Adcock PT, DPT 05/09/20 11:24 AM   [place], [place], [address] Phone:  [phone]   Fax:  [phone]  Name: Penny Gibson MRN: [id number] Date of Birth: 1945-09-16

## 2020-05-12 ENCOUNTER — Encounter: Payer: Medicare Other | Admitting: Physical Therapy

## 2020-05-14 ENCOUNTER — Encounter: Payer: Medicare Other | Admitting: Physical Therapy

## 2020-05-19 ENCOUNTER — Ambulatory Visit: Payer: Medicare Other | Admitting: Physical Therapy

## 2020-05-21 ENCOUNTER — Encounter: Payer: Medicare Other | Admitting: Physical Therapy

## 2020-06-19 ENCOUNTER — Other Ambulatory Visit: Payer: Self-pay | Admitting: Nurse Practitioner

## 2020-06-19 DIAGNOSIS — Z1231 Encounter for screening mammogram for malignant neoplasm of breast: Secondary | ICD-10-CM

## 2020-06-24 ENCOUNTER — Other Ambulatory Visit: Payer: Self-pay | Admitting: Nurse Practitioner

## 2020-06-24 ENCOUNTER — Other Ambulatory Visit: Payer: Self-pay

## 2020-06-24 ENCOUNTER — Ambulatory Visit (INDEPENDENT_AMBULATORY_CARE_PROVIDER_SITE_OTHER): Payer: Medicare Other | Admitting: Orthopaedic Surgery

## 2020-06-24 DIAGNOSIS — I10 Essential (primary) hypertension: Secondary | ICD-10-CM

## 2020-06-24 DIAGNOSIS — Z96652 Presence of left artificial knee joint: Secondary | ICD-10-CM

## 2020-06-24 NOTE — Progress Notes (Signed)
Post-Op Visit Note   Patient: Penny Gibson           Date of Birth: Mar 08, 1946           MRN: [id number] Visit Date: 06/24/2020 PCP: Anne Ng, NP   Assessment & Plan:  Chief Complaint:  Chief Complaint  Patient presents with  . Left Knee - Follow-up   Visit Diagnoses:  1. Status post total left knee replacement     Plan:   [name] is 69-month status post left total knee replacement.  She has no pain and no real complaints other than some popping.  She is doing home exercise at times.  Has some occasional start up stiffness.  Surgical scars fully healed.  Excellent range of motion.  Stable to varus valgus.  Ambulating normally.  Penny Gibson has done very well from her knee replacement.  She has resumed all normal activities.  She will continue to work on strengthening and this will help with the popping and some generalized weakness.  Dental prophylaxis reinforced.  She will follow-up with [place] in 3 months with two-view x-rays of the left knee.  Follow-Up Instructions: Return in about 3 months (around 09/22/2020).   Orders:  No orders of the defined types were placed in this encounter.  No orders of the defined types were placed in this encounter.   Imaging: No results found.  PMFS History: Patient Active Problem List   Diagnosis Date Noted  . 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 Cardiomegaly 06/27/2018  . Iron deficiency anemia 07/19/2017  . Primary osteoarthritis of left knee 12/31/2016  . Asthma, mild intermittent 05/28/2016  . Other seasonal allergic rhinitis 07/26/2014  . Atypical squamous cells of undetermined significance (ASCUS) on Papanicolaou smear of cervix 01/31/2014  . BMI 40.0-44.9, adult ([facility]) 11/15/2013  . Essential hypertension 09/19/2013  . Generalized osteoarthritis of multiple sites    Past Medical History:  Diagnosis Date  . Anemia   . Arthritis   . Asthma   . Constipation   . Diabetes mellitus without  complication ([facility])   . Dyspnea   . Hyperlipidemia   . Hypertension   . Insomnia   . Joint pain   . Knee pain   . Lactose intolerance   . Lower extremity edema   . Multiple food allergies   . Osteoarthritis   . Sinus problem     Family History  Problem Relation Age of Onset  . Heart attack Mother   . Heart disease Mother   . Sudden death Mother   . Obesity Mother   . Cancer Sister   . Breast cancer Sister 53  . Stroke Maternal Grandmother   . Sudden death Father     Past Surgical History:  Procedure Laterality Date  . BREAST BIOPSY Left 2010  . BREAST LUMPECTOMY Left 2010  . EYE SURGERY Bilateral    cataract surgery  . TOTAL KNEE ARTHROPLASTY Left 03/10/2020   Procedure: LEFT TOTAL KNEE ARTHROPLASTY;  Surgeon: Tarry Kos, MD;  Location: [facility];  Service: Orthopedics;  Laterality: Left;  . TUBAL LIGATION     Social History   Occupational History  . Not on file  Tobacco Use  . Smoking status: Never Smoker  . Smokeless tobacco: Never Used  Vaping Use  . Vaping Use: Never used  Substance and Sexual Activity  . Alcohol use: No  . Drug use: No  . Sexual activity: Not Currently    Birth control/protection:  Surgical

## 2020-06-25 NOTE — Telephone Encounter (Signed)
Last OV 07/11/19 Last fill for Lisinopril 12/27/19  #90/3 Last fill for Hydrochlorothiazide 07/11/19  #90/3

## 2020-07-08 DIAGNOSIS — I10 Essential (primary) hypertension: Secondary | ICD-10-CM | POA: Diagnosis not present

## 2020-07-15 DIAGNOSIS — D23112 Other benign neoplasm of skin of right lower eyelid, including canthus: Secondary | ICD-10-CM | POA: Diagnosis not present

## 2020-07-15 DIAGNOSIS — H43813 Vitreous degeneration, bilateral: Secondary | ICD-10-CM | POA: Diagnosis not present

## 2020-07-15 DIAGNOSIS — D23122 Other benign neoplasm of skin of left lower eyelid, including canthus: Secondary | ICD-10-CM | POA: Diagnosis not present

## 2020-07-15 DIAGNOSIS — Z961 Presence of intraocular lens: Secondary | ICD-10-CM | POA: Diagnosis not present

## 2020-07-18 ENCOUNTER — Other Ambulatory Visit: Payer: Self-pay

## 2020-07-18 ENCOUNTER — Encounter: Payer: Self-pay | Admitting: Nurse Practitioner

## 2020-07-18 ENCOUNTER — Ambulatory Visit (INDEPENDENT_AMBULATORY_CARE_PROVIDER_SITE_OTHER): Payer: Medicare Other | Admitting: Nurse Practitioner

## 2020-07-18 VITALS — BP 130/82 | HR 86 | Temp 96.0°F | Ht 61.0 in | Wt 211.4 lb

## 2020-07-18 DIAGNOSIS — D509 Iron deficiency anemia, unspecified: Secondary | ICD-10-CM

## 2020-07-18 DIAGNOSIS — E559 Vitamin D deficiency, unspecified: Secondary | ICD-10-CM

## 2020-07-18 DIAGNOSIS — E119 Type 2 diabetes mellitus without complications: Secondary | ICD-10-CM | POA: Insufficient documentation

## 2020-07-18 DIAGNOSIS — E1169 Type 2 diabetes mellitus with other specified complication: Secondary | ICD-10-CM | POA: Insufficient documentation

## 2020-07-18 DIAGNOSIS — J4520 Mild intermittent asthma, uncomplicated: Secondary | ICD-10-CM | POA: Diagnosis not present

## 2020-07-18 DIAGNOSIS — E785 Hyperlipidemia, unspecified: Secondary | ICD-10-CM

## 2020-07-18 DIAGNOSIS — E1122 Type 2 diabetes mellitus with diabetic chronic kidney disease: Secondary | ICD-10-CM | POA: Insufficient documentation

## 2020-07-18 DIAGNOSIS — I10 Essential (primary) hypertension: Secondary | ICD-10-CM | POA: Diagnosis not present

## 2020-07-18 MED ORDER — LISINOPRIL 40 MG PO TABS: 40.0000 mg | ORAL_TABLET | Freq: Every day | ORAL | 3 refills | Status: DC

## 2020-07-18 MED ORDER — ALBUTEROL SULFATE HFA 108 (90 BASE) MCG/ACT IN AERS
1.0000 | INHALATION_SPRAY | Freq: Four times a day (QID) | RESPIRATORY_TRACT | 0 refills | Status: DC | PRN
Start: 2020-07-18 — End: 2021-02-27

## 2020-07-18 NOTE — Assessment & Plan Note (Signed)
Use of albuterol once a month. Need refill due to expired canister

## 2020-07-18 NOTE — Assessment & Plan Note (Signed)
Diet controlled Repeat HgbA1c and urine microalbumin Advised to schedule annual eye exam Negative neuropathy BP at goal.

## 2020-07-18 NOTE — Assessment & Plan Note (Addendum)
Repeat lipid panel. Not taking any statin.

## 2020-07-18 NOTE — Assessment & Plan Note (Addendum)
BP at goal Decline in renal function during recent hospitalization. States she was not instruction to stop HCTZ. Current use of lisinopril and HCTZ BP Readings from Last 3 Encounters:  07/18/20 130/82  03/14/20 123/62  03/11/20 104/71   CMP     Component Value Date/Time   NA 132 (L) 03/14/2020 1505   NA 137 12/28/2019 0932   K 4.4 03/14/2020 1505   CL 102 03/14/2020 1505   CO2 15 (L) 03/14/2020 1505   GLUCOSE 93 03/14/2020 1505   BUN 63 (H) 03/14/2020 1505   BUN 15 12/28/2019 0932   CREATININE 3.02 (H) 03/14/2020 1505   CREATININE 0.77 10/11/2017 1057   CREATININE 0.88 06/22/2013 1506   CALCIUM 9.1 03/14/2020 1505   PROT 7.6 03/14/2020 1505   PROT 8.3 12/28/2019 0932   ALBUMIN 3.2 (L) 03/14/2020 1505   ALBUMIN 4.4 12/28/2019 0932   AST 16 03/14/2020 1505   AST 16 10/11/2017 1057   ALT 14 03/14/2020 1505   ALT 13 10/11/2017 1057   ALKPHOS 62 03/14/2020 1505   BILITOT 0.6 03/14/2020 1505   BILITOT 0.3 12/28/2019 0932   BILITOT 0.3 10/11/2017 1057   GFRNONAA 15 (L) 03/14/2020 1505   GFRNONAA >60 10/11/2017 1057   GFRAA 17 (L) 03/14/2020 1505   GFRAA >60 10/11/2017 1057   Repeat CMP Hold HCTZ while waiting for lab results

## 2020-07-18 NOTE — Patient Instructions (Signed)
Return to lab for fasting blood draw and urine collection You need to be fasting at least 6-8hrs prior to blood draw. Maintain upcoming appt for mammogram You will be contacted about repeat cologuard.

## 2020-07-18 NOTE — Assessment & Plan Note (Signed)
Repeat cbc and iron panel 

## 2020-07-18 NOTE — Assessment & Plan Note (Signed)
Repeat Vitamin D 

## 2020-07-18 NOTE — Progress Notes (Signed)
Subjective:  Patient ID: Penny Gibson, female    DOB: 1945-08-29  Age: 75 y.o. MRN: [id number]  CC: Follow-up (HTN and DM f/up. pt is not fasting,)  HPI  Essential hypertension BP at goal Decline in renal function during recent hospitalization. States she was not instruction to stop HCTZ. Current use of lisinopril and HCTZ BP Readings from Last 3 Encounters:  07/18/20 130/82  03/14/20 123/62  03/11/20 104/71   CMP     Component Value Date/Time   NA 132 (L) 03/14/2020 1505   NA 137 12/28/2019 0932   K 4.4 03/14/2020 1505   CL 102 03/14/2020 1505   CO2 15 (L) 03/14/2020 1505   GLUCOSE 93 03/14/2020 1505   BUN 63 (H) 03/14/2020 1505   BUN 15 12/28/2019 0932   CREATININE 3.02 (H) 03/14/2020 1505   CREATININE 0.77 10/11/2017 1057   CREATININE 0.88 06/22/2013 1506   CALCIUM 9.1 03/14/2020 1505   PROT 7.6 03/14/2020 1505   PROT 8.3 12/28/2019 0932   ALBUMIN 3.2 (L) 03/14/2020 1505   ALBUMIN 4.4 12/28/2019 0932   AST 16 03/14/2020 1505   AST 16 10/11/2017 1057   ALT 14 03/14/2020 1505   ALT 13 10/11/2017 1057   ALKPHOS 62 03/14/2020 1505   BILITOT 0.6 03/14/2020 1505   BILITOT 0.3 12/28/2019 0932   BILITOT 0.3 10/11/2017 1057   GFRNONAA 15 (L) 03/14/2020 1505   GFRNONAA >60 10/11/2017 1057   GFRAA 17 (L) 03/14/2020 1505   GFRAA >60 10/11/2017 1057   Repeat CMP Hold HCTZ while waiting for lab results  Diabetes mellitus ([place]) Diet controlled Repeat HgbA1c and urine microalbumin Advised to schedule annual eye exam Negative neuropathy BP at goal.  Vitamin D deficiency Repeat Vitamin D  Iron deficiency anemia Repeat cbc and iron panel  Asthma, mild intermittent Use of albuterol once a month. Need refill due to expired canister  Hyperlipidemia associated with type 2 diabetes mellitus ([place]) Repeat lipid panel.  Lipid Panel     Component Value Date/Time   CHOL 227 (H) 12/28/2019 0932   TRIG 138 12/28/2019 0932   HDL 50 12/28/2019 0932    CHOLHDL 4 03/13/2018 0948   VLDL 26.6 03/13/2018 0948   LDLCALC 152 (H) 12/28/2019 0932   LABVLDL 25 12/28/2019 0932   Wt Readings from Last 3 Encounters:  07/18/20 211 lb 6.4 oz (95.9 kg)  03/25/20 206 lb (93.4 kg)  03/10/20 206 lb 12.7 oz (93.8 kg)   BP Readings from Last 3 Encounters:  07/18/20 130/82  03/14/20 123/62  03/11/20 104/71   Reviewed past Medical, Social and Family history today.  Outpatient Medications Prior to Visit  Medication Sig Dispense Refill  . aspirin EC 81 MG tablet Take 1 tablet (81 mg total) by mouth 2 (two) times daily. 84 tablet 0  . acetaminophen-codeine (TYLENOL #3) 300-30 MG tablet 1 TAB PO Q 6-8 HOURS PRN PAIN 30 tablet 0  . lisinopril (ZESTRIL) 40 MG tablet Take 1 tablet (40 mg total) by mouth daily. Need office visit for additional refills 90 tablet 0  . hydrochlorothiazide (MICROZIDE) 12.5 MG capsule TAKE 1 CAPSULE(12.5 MG) BY MOUTH DAILY (Patient not taking: Reported on 07/18/2020) 90 capsule 1  . albuterol (PROAIR HFA) 108 (90 Base) MCG/ACT inhaler Inhale 1-2 puffs into the lungs every 6 (six) hours as needed for wheezing or shortness of breath. (Patient not taking: Reported on 07/18/2020) 6.7 g 0  . celecoxib (CELEBREX) 200 MG capsule Take 1 capsule (200 mg total) by  mouth 2 (two) times daily. (Patient not taking: Reported on 07/18/2020) 60 capsule 2  . cetirizine (ZYRTEC) 10 MG tablet Take 10 mg by mouth daily. (Patient not taking: Reported on 07/18/2020)    . docusate sodium (COLACE) 100 MG capsule Take 1 capsule (100 mg total) by mouth daily as needed. (Patient not taking: Reported on 07/18/2020) 30 capsule 2  . guaiFENesin (MUCINEX) 600 MG 12 hr tablet Take by mouth 2 (two) times daily. (Patient not taking: Reported on 07/18/2020)    . metFORMIN (GLUCOPHAGE) 500 MG tablet Take 1 tablet (500 mg total) by mouth daily with breakfast. (Patient not taking: Reported on 07/18/2020) 30 tablet 0  . methocarbamol (ROBAXIN-750) 750 MG tablet Take 1 tablet (750 mg  total) by mouth 3 (three) times daily as needed for muscle spasms. (Patient not taking: Reported on 07/18/2020) 60 tablet 0  . Multiple Vitamins-Minerals (CENTRUM MULTIGUMMIES PO) Take 2 each by mouth daily. (Patient not taking: Reported on 07/18/2020)    . ondansetron (ZOFRAN) 4 MG tablet Take 1 tablet (4 mg total) by mouth every 8 (eight) hours as needed for nausea or vomiting. (Patient not taking: Reported on 07/18/2020) 40 tablet 0  . oxyCODONE-acetaminophen (PERCOCET) 5-325 MG tablet Take 1-2 tablets by mouth every 6 (six) hours as needed for severe pain. (Patient not taking: Reported on 07/18/2020) 40 tablet 0  . sulfamethoxazole-trimethoprim (BACTRIM DS) 800-160 MG tablet Take 1 tablet by mouth 2 (two) times daily. (Patient not taking: Reported on 07/18/2020) 20 tablet 0  . Vitamin D, Ergocalciferol, (DRISDOL) 1.25 MG (50000 UNIT) CAPS capsule Take 1 capsule (50,000 Units total) by mouth every 7 (seven) days. (Patient not taking: Reported on 07/18/2020) 4 capsule 0   No facility-administered medications prior to visit.    ROS See HPI  Objective:  BP 130/82   Pulse 86   Temp (!) 96 F (35.6 C) (Temporal)   Ht 5\' 1"  (1.549 m)   Wt 211 lb 6.4 oz (95.9 kg)   SpO2 98%   BMI 39.94 kg/m   Physical Exam Eyes:     Extraocular Movements: Extraocular movements intact.     Conjunctiva/sclera: Conjunctivae normal.  Cardiovascular:     Rate and Rhythm: Normal rate and regular rhythm.     Pulses: Normal pulses.     Heart sounds: Normal heart sounds.  Pulmonary:     Effort: Pulmonary effort is normal.     Breath sounds: Normal breath sounds.  Chest:     Comments: Declined breast exam Abdominal:     General: There is no distension.     Palpations: Abdomen is soft.     Tenderness: There is no abdominal tenderness.  Musculoskeletal:     Cervical back: Normal range of motion and neck supple.     Right lower leg: Edema present.     Left lower leg: Edema present.  Lymphadenopathy:      Cervical: No cervical adenopathy.  Neurological:     Mental Status: She is alert and oriented to person, place, and time.     Assessment & Plan:  This visit occurred during the SARS-CoV-2 public health emergency.  Safety protocols were in place, including screening questions prior to the visit, additional usage of staff PPE, and extensive cleaning of exam room while observing appropriate contact time as indicated for disinfecting solutions.   Penny Gibson was seen today for follow-up.  Diagnoses and all orders for this visit:  Essential hypertension -     Comprehensive metabolic panel; Future -  lisinopril (ZESTRIL) 40 MG tablet; Take 1 tablet (40 mg total) by mouth daily.  Iron deficiency anemia, unspecified iron deficiency anemia type -     CBC with Differential/Platelet; Future -     Cancel: Iron, TIBC and Ferritin Panel; Future -     Ferritin; Future  Hyperlipidemia associated with type 2 diabetes mellitus (HCC) -     Comprehensive metabolic panel; Future -     Lipid panel; Future  Type 2 diabetes mellitus with other specified complication, without long-term current use of insulin (HCC) -     Comprehensive metabolic panel; Future -     Microalbumin / creatinine urine ratio; Future -     Hemoglobin A1c; Future  Vitamin D deficiency -     Vitamin D 1,25 dihydroxy; Future  Mild intermittent asthma without complication -     albuterol (PROAIR HFA) 108 (90 Base) MCG/ACT inhaler; Inhale 1-2 puffs into the lungs every 6 (six) hours as needed for wheezing or shortness of breath.   Problem List Items Addressed This Visit      Cardiovascular and Mediastinum   Essential hypertension - Primary (Chronic)    BP at goal Decline in renal function during recent hospitalization. States she was not instruction to stop HCTZ. Current use of lisinopril and HCTZ BP Readings from Last 3 Encounters:  07/18/20 130/82  03/14/20 123/62  03/11/20 104/71   CMP     Component Value Date/Time    NA 132 (L) 03/14/2020 1505   NA 137 12/28/2019 0932   K 4.4 03/14/2020 1505   CL 102 03/14/2020 1505   CO2 15 (L) 03/14/2020 1505   GLUCOSE 93 03/14/2020 1505   BUN 63 (H) 03/14/2020 1505   BUN 15 12/28/2019 0932   CREATININE 3.02 (H) 03/14/2020 1505   CREATININE 0.77 10/11/2017 1057   CREATININE 0.88 06/22/2013 1506   CALCIUM 9.1 03/14/2020 1505   PROT 7.6 03/14/2020 1505   PROT 8.3 12/28/2019 0932   ALBUMIN 3.2 (L) 03/14/2020 1505   ALBUMIN 4.4 12/28/2019 0932   AST 16 03/14/2020 1505   AST 16 10/11/2017 1057   ALT 14 03/14/2020 1505   ALT 13 10/11/2017 1057   ALKPHOS 62 03/14/2020 1505   BILITOT 0.6 03/14/2020 1505   BILITOT 0.3 12/28/2019 0932   BILITOT 0.3 10/11/2017 1057   GFRNONAA 15 (L) 03/14/2020 1505   GFRNONAA >60 10/11/2017 1057   GFRAA 17 (L) 03/14/2020 1505   GFRAA >60 10/11/2017 1057   Repeat CMP Hold HCTZ while waiting for lab results      Relevant Medications   lisinopril (ZESTRIL) 40 MG tablet   Other Relevant Orders   Comprehensive metabolic panel     Respiratory   Asthma, mild intermittent    Use of albuterol once a month. Need refill due to expired canister      Relevant Medications   albuterol (PROAIR HFA) 108 (90 Base) MCG/ACT inhaler     Endocrine   Diabetes mellitus (HCC)    Diet controlled Repeat HgbA1c and urine microalbumin Advised to schedule annual eye exam Negative neuropathy BP at goal.      Relevant Medications   lisinopril (ZESTRIL) 40 MG tablet   Other Relevant Orders   Comprehensive metabolic panel   Microalbumin / creatinine urine ratio   Hemoglobin A1c   Hyperlipidemia associated with type 2 diabetes mellitus (HCC)    Repeat lipid panel.  Lipid Panel     Component Value Date/Time   CHOL 227 (H) 12/28/2019 0932  TRIG 138 12/28/2019 0932   HDL 50 12/28/2019 0932   CHOLHDL 4 03/13/2018 0948   VLDL 26.6 03/13/2018 0948   LDLCALC 152 (H) 12/28/2019 0932   LABVLDL 25 12/28/2019 0932        Relevant  Medications   lisinopril (ZESTRIL) 40 MG tablet   Other Relevant Orders   Comprehensive metabolic panel   Lipid panel     Other   Iron deficiency anemia    Repeat cbc and iron panel      Relevant Orders   CBC with Differential/Platelet   Ferritin   Vitamin D deficiency    Repeat Vitamin D      Relevant Orders   Vitamin D 1,25 dihydroxy      Follow-up: Return in about 3 months (around 10/16/2020) for DM and HTN, hyperlipidemia (fasting, complete PHQ/GAD).  [name], NP

## 2020-07-22 ENCOUNTER — Telehealth: Payer: Self-pay | Admitting: Nurse Practitioner

## 2020-07-22 NOTE — Telephone Encounter (Signed)
Ok, thank you

## 2020-07-22 NOTE — Telephone Encounter (Signed)
Pt called back to inform [name] that she get her Wellness appointments from [facility].  They come to her apartment.

## 2020-07-22 NOTE — Telephone Encounter (Signed)
Left message for patient to schedule Annual Wellness Visit.  Please schedule with Nurse Health Advisor Martha Stanley, RN at [facility]  °

## 2020-07-31 ENCOUNTER — Other Ambulatory Visit: Payer: Self-pay

## 2020-07-31 ENCOUNTER — Ambulatory Visit
Admission: RE | Admit: 2020-07-31 | Discharge: 2020-07-31 | Disposition: A | Discharge status: Home / Self Care | Payer: Medicare Other | Source: Ambulatory Visit | Attending: Nurse Practitioner | Admitting: Nurse Practitioner

## 2020-07-31 DIAGNOSIS — Z1231 Encounter for screening mammogram for malignant neoplasm of breast: Secondary | ICD-10-CM | POA: Diagnosis not present

## 2020-08-04 ENCOUNTER — Other Ambulatory Visit: Payer: Self-pay

## 2020-08-05 ENCOUNTER — Other Ambulatory Visit: Payer: Self-pay | Admitting: Nurse Practitioner

## 2020-08-05 ENCOUNTER — Other Ambulatory Visit (INDEPENDENT_AMBULATORY_CARE_PROVIDER_SITE_OTHER): Payer: Medicare Other

## 2020-08-05 DIAGNOSIS — I10 Essential (primary) hypertension: Secondary | ICD-10-CM | POA: Diagnosis not present

## 2020-08-05 DIAGNOSIS — E1169 Type 2 diabetes mellitus with other specified complication: Secondary | ICD-10-CM

## 2020-08-05 DIAGNOSIS — E559 Vitamin D deficiency, unspecified: Secondary | ICD-10-CM

## 2020-08-05 DIAGNOSIS — E785 Hyperlipidemia, unspecified: Secondary | ICD-10-CM | POA: Diagnosis not present

## 2020-08-05 DIAGNOSIS — R928 Other abnormal and inconclusive findings on diagnostic imaging of breast: Secondary | ICD-10-CM

## 2020-08-05 DIAGNOSIS — D509 Iron deficiency anemia, unspecified: Secondary | ICD-10-CM

## 2020-08-05 LAB — CBC WITH DIFFERENTIAL/PLATELET
Basophils Absolute: 0.1 10*3/uL (ref 0.0–0.1)
Basophils Relative: 0.5 % (ref 0.0–3.0)
Eosinophils Absolute: 0.2 10*3/uL (ref 0.0–0.7)
Eosinophils Relative: 1.4 % (ref 0.0–5.0)
HCT: 30.6 % — ABNORMAL LOW (ref 36.0–46.0)
Hemoglobin: 9.7 g/dL — ABNORMAL LOW (ref 12.0–15.0)
Lymphocytes Relative: 16.5 % (ref 12.0–46.0)
Lymphs Abs: 1.9 10*3/uL (ref 0.7–4.0)
MCHC: 31.8 g/dL (ref 30.0–36.0)
MCV: 86.1 fl (ref 78.0–100.0)
Monocytes Absolute: 0.8 10*3/uL (ref 0.1–1.0)
Monocytes Relative: 7 % (ref 3.0–12.0)
Neutro Abs: 8.7 10*3/uL — ABNORMAL HIGH (ref 1.4–7.7)
Neutrophils Relative %: 74.6 % (ref 43.0–77.0)
Platelets: 313 10*3/uL (ref 150.0–400.0)
RBC: 3.56 Mil/uL — ABNORMAL LOW (ref 3.87–5.11)
RDW: 15 % (ref 11.5–15.5)
WBC: 11.7 10*3/uL — ABNORMAL HIGH (ref 4.0–10.5)

## 2020-08-05 LAB — LIPID PANEL
Cholesterol: 217 mg/dL — ABNORMAL HIGH (ref 0–200)
HDL: 45.6 mg/dL (ref >39.00)
LDL Cholesterol: 133 mg/dL — ABNORMAL HIGH (ref 0–99)
NonHDL: 171.64
Total CHOL/HDL Ratio: 5
Triglycerides: 193 mg/dL — ABNORMAL HIGH (ref 0.0–149.0)
VLDL: 38.6 mg/dL (ref 0.0–40.0)

## 2020-08-05 LAB — HEMOGLOBIN A1C: Hgb A1c MFr Bld: 6.5 % (ref 4.6–6.5)

## 2020-08-05 LAB — COMPREHENSIVE METABOLIC PANEL
ALT: 9 U/L (ref 0–35)
AST: 12 U/L (ref 0–37)
Albumin: 4.1 g/dL (ref 3.5–5.2)
Alkaline Phosphatase: 90 U/L (ref 39–117)
BUN: 17 mg/dL (ref 6–23)
CO2: 31 mEq/L (ref 19–32)
Calcium: 9.7 mg/dL (ref 8.4–10.5)
Chloride: 100 mEq/L (ref 96–112)
Creatinine, Ser: 1.03 mg/dL (ref 0.40–1.20)
GFR: 54.05 mL/min — ABNORMAL LOW (ref >60.00)
Glucose, Bld: 95 mg/dL (ref 70–99)
Potassium: 5 mEq/L (ref 3.5–5.1)
Sodium: 137 mEq/L (ref 135–145)
Total Bilirubin: 0.3 mg/dL (ref 0.2–1.2)
Total Protein: 8.4 g/dL — ABNORMAL HIGH (ref 6.0–8.3)

## 2020-08-05 LAB — MICROALBUMIN / CREATININE URINE RATIO
Creatinine,U: 89.5 mg/dL
Microalb Creat Ratio: 0.8 mg/g (ref 0.0–30.0)
Microalb, Ur: 0.7 mg/dL (ref 0.0–1.9)

## 2020-08-05 LAB — FERRITIN: Ferritin: 16.4 ng/mL (ref 10.0–291.0)

## 2020-08-08 ENCOUNTER — Ambulatory Visit
Admission: RE | Admit: 2020-08-08 | Discharge: 2020-08-08 | Disposition: A | Discharge status: Home / Self Care | Payer: Medicare Other | Source: Ambulatory Visit | Attending: Nurse Practitioner | Admitting: Nurse Practitioner

## 2020-08-08 ENCOUNTER — Other Ambulatory Visit: Payer: Self-pay | Admitting: Nurse Practitioner

## 2020-08-08 ENCOUNTER — Other Ambulatory Visit: Payer: Self-pay

## 2020-08-08 DIAGNOSIS — R921 Mammographic calcification found on diagnostic imaging of breast: Secondary | ICD-10-CM

## 2020-08-08 DIAGNOSIS — R928 Other abnormal and inconclusive findings on diagnostic imaging of breast: Secondary | ICD-10-CM | POA: Diagnosis not present

## 2020-08-08 DIAGNOSIS — R922 Inconclusive mammogram: Secondary | ICD-10-CM | POA: Diagnosis not present

## 2020-08-09 LAB — VITAMIN D 1,25 DIHYDROXY
Vitamin D 1, 25 (OH)2 Total: 27 pg/mL (ref 18–72)
Vitamin D2 1, 25 (OH)2: <8 pg/mL
Vitamin D3 1, 25 (OH)2: 27 pg/mL

## 2020-08-09 MED ORDER — ATORVASTATIN CALCIUM 20 MG PO TABS: 20.0000 mg | ORAL_TABLET | Freq: Every day | ORAL | 3 refills | Status: DC

## 2020-08-18 ENCOUNTER — Ambulatory Visit
Admission: RE | Admit: 2020-08-18 | Discharge: 2020-08-18 | Disposition: A | Discharge status: Home / Self Care | Payer: Medicare Other | Source: Ambulatory Visit | Attending: Nurse Practitioner | Admitting: Nurse Practitioner

## 2020-08-18 ENCOUNTER — Other Ambulatory Visit: Payer: Self-pay

## 2020-08-18 DIAGNOSIS — R921 Mammographic calcification found on diagnostic imaging of breast: Secondary | ICD-10-CM | POA: Diagnosis not present

## 2020-08-18 DIAGNOSIS — D0511 Intraductal carcinoma in situ of right breast: Secondary | ICD-10-CM | POA: Diagnosis not present

## 2020-08-26 ENCOUNTER — Ambulatory Visit: Payer: Self-pay | Admitting: General Surgery

## 2020-08-26 ENCOUNTER — Other Ambulatory Visit: Payer: Self-pay | Admitting: General Surgery

## 2020-08-26 DIAGNOSIS — D0511 Intraductal carcinoma in situ of right breast: Secondary | ICD-10-CM

## 2020-08-27 ENCOUNTER — Encounter: Payer: Self-pay | Admitting: Adult Health

## 2020-08-27 DIAGNOSIS — Z170 Estrogen receptor positive status [ER+]: Secondary | ICD-10-CM | POA: Insufficient documentation

## 2020-08-27 DIAGNOSIS — Z853 Personal history of malignant neoplasm of breast: Secondary | ICD-10-CM | POA: Insufficient documentation

## 2020-09-02 ENCOUNTER — Telehealth: Payer: Self-pay | Admitting: Hematology and Oncology

## 2020-09-02 NOTE — Telephone Encounter (Signed)
Received a new pt referral from Dr. Marlou Starks for DCIS. [name] has been cld and scheduled to see Dr. Lindi Adie on 3/23 at 1pm. Pt aware to arrive 15 minutes early.

## 2020-09-04 ENCOUNTER — Telehealth: Payer: Self-pay | Admitting: Nurse Practitioner

## 2020-09-04 NOTE — Telephone Encounter (Signed)
I received a call from Penny Gibson to reschedule her med onc appt to see lacie on 4/14 at 11am. Pt aware to arrive 20 minutes early.

## 2020-09-08 NOTE — Progress Notes (Signed)
New Breast Cancer Diagnosis: Right Breast UOQ  Did patient present with symptoms (if so, please note symptoms) or screening mammography?:Screening Mass    Mammogram: 5 mm area of abnormal calcification in the UOQ of the right breast.  Location and Extent of disease :right breast. Located at UOQ, measured  5 mm in greatest dimension.   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 Marlou Starks 08/26/2020 -I will refer her to medical and radiation oncology to discuss adjuvant therapy. - Right Breast Lumpectomy with Radioactive seed localization 09/19/2020  Medical oncologist, treatment if any:   Dr. Fabiola Backer 10/02/2020 11 am   Family History of Breast/Ovarian/Prostate Cancer: Sister had breast cancer, cousin had colon cancer.  Lymphedema issues, if any:  No  Pain issues, if any: No  SAFETY ISSUES: Prior radiation? No Pacemaker/ICD? No Possible current pregnancy? Postmenopausal Is the patient on methotrexate? No  Current Complaints / other details:

## 2020-09-09 ENCOUNTER — Encounter: Payer: Self-pay | Admitting: Radiation Oncology

## 2020-09-09 ENCOUNTER — Other Ambulatory Visit: Payer: Self-pay

## 2020-09-09 ENCOUNTER — Ambulatory Visit
Admission: RE | Admit: 2020-09-09 | Discharge: 2020-09-09 | Disposition: A | Discharge status: Home / Self Care | Payer: Medicare Other | Source: Ambulatory Visit | Attending: Radiation Oncology | Admitting: Radiation Oncology

## 2020-09-09 VITALS — Ht 61.0 in | Wt 214.0 lb

## 2020-09-09 DIAGNOSIS — Z170 Estrogen receptor positive status [ER+]: Secondary | ICD-10-CM

## 2020-09-09 DIAGNOSIS — C50411 Malignant neoplasm of upper-outer quadrant of right female breast: Secondary | ICD-10-CM

## 2020-09-09 NOTE — Progress Notes (Signed)
Radiation Oncology         ([phone] ________________________________  Initial Outpatient Consultation - Conducted via telephone due to current COVID-19 concerns for limiting patient exposure  I spoke with the patient to conduct this consult visit via telephone to spare the patient unnecessary potential exposure in the healthcare setting during the current COVID-19 pandemic. The patient was notified in advance and was offered a [place] meeting to allow for face to face communication but unfortunately reported that they did not have the appropriate resources/technology to support such a visit and instead preferred to proceed with a telephone consult.    Name: Penny Gibson        MRN: [id number]  Date of Service: 09/09/2020 DOB: [birth year]  GD:JMEQ, [name], NP  [name], MD     REFERRING PHYSICIAN: Autumn Messing III, MD   DIAGNOSIS: The encounter diagnosis was Malignant neoplasm of upper-outer quadrant of right breast in female, estrogen receptor positive ([place]).   HISTORY OF PRESENT ILLNESS: Penny Gibson is a 75 y.o. female seen at the request of Dr. Marlou Starks for a newly diagnosed right breast cancer. The patient was originally found to have screening detected calcifications in the right breast and has a family history of a sister who had breast cancer.  The abnormality was seen in the upper outer quadrant and further diagnostic imaging with mammography showed a 5 mm grouping of calcifications in the upper outer quadrant.  Ultrasound was not performed.  She underwent a stereotactic biopsy on 08/18/2020 that showed high-grade DCIS with calcifications and necrosis her tumor was ER/PR positive.  She is seen today to discuss treatment options for her cancer, her case was discussed in multidisciplinary breast oncology conference and recommendations and discussion were for lumpectomy, adjuvant radiotherapy and adjuvant antiestrogen.  She is scheduled for lumpectomy on  09/19/2020 and will meet with Dr. Burr Medico on 10/02/2020.  She is contacted today by telephone to discuss the role of adjuvant radiotherapy.    PREVIOUS RADIATION THERAPY: No   PAST MEDICAL HISTORY:  Past Medical History:  Diagnosis Date  . Anemia   . Arthritis   . Asthma   . Constipation   . Diabetes mellitus without complication ([place])   . Dyspnea   . Hyperlipidemia   . Hypertension   . Insomnia   . Joint pain   . Knee pain   . Lactose intolerance   . Lower extremity edema   . Multiple food allergies   . Osteoarthritis   . Sinus problem        PAST SURGICAL HISTORY: Past Surgical History:  Procedure Laterality Date  . BREAST BIOPSY Left 2010  . BREAST LUMPECTOMY Left 2010  . EYE SURGERY Bilateral    cataract surgery  . 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 TOTAL KNEE ARTHROPLASTY Left 03/10/2020   Procedure: LEFT TOTAL KNEE ARTHROPLASTY;  Surgeon: Leandrew Koyanagi, MD;  Location: [facility];  Service: Orthopedics;  Laterality: Left;  . TUBAL LIGATION       FAMILY HISTORY:  Family History  Problem Relation Age of Onset  . Heart attack Mother   . Heart disease Mother   . Sudden death Mother   . Obesity Mother   . Cancer Sister   . Breast cancer Sister 49  . Stroke Maternal Grandmother   . Sudden death Father      SOCIAL HISTORY:  reports that she has never smoked. She has never used smokeless tobacco. She reports that  she does not drink alcohol and does not use drugs. The patient is divorced and lives in [place]. She enjoys painting landscapes with acrylic.    ALLERGIES: Feraheme [ferumoxytol], Penicillins, and Citric acid   MEDICATIONS:  Current Outpatient Medications  Medication Sig Dispense Refill  . albuterol (PROAIR HFA) 108 (90 Base) MCG/ACT inhaler Inhale 1-2 puffs into the lungs every 6 (six) hours as needed for wheezing or shortness of breath. 6.7 g 0  . aspirin EC 81 MG tablet Take 1 tablet (81 mg total) by mouth 2 (two) times daily.  84 tablet 0  . atorvastatin (LIPITOR) 20 MG tablet Take 1 tablet (20 mg total) by mouth at bedtime. 90 tablet 3  . hydrochlorothiazide (MICROZIDE) 12.5 MG capsule TAKE 1 CAPSULE(12.5 MG) BY MOUTH DAILY (Patient not taking: Reported on 07/18/2020) 90 capsule 1  . lisinopril (ZESTRIL) 40 MG tablet Take 1 tablet (40 mg total) by mouth daily. 90 tablet 3   No current facility-administered medications for this encounter.     REVIEW OF SYSTEMS: On review of systems, the patient reports that she is doing well overall. She denies any concerns with her breast. No other complaints are verbalized.    PHYSICAL EXAM:  Wt Readings from Last 3 Encounters:  07/18/20 211 lb 6.4 oz (95.9 kg)  03/25/20 206 lb (93.4 kg)  03/10/20 206 lb 12.7 oz (93.8 kg)   Unable to assess due to encounter type.    ECOG = 0  0 - Asymptomatic (Fully active, able to carry on all predisease activities without restriction)  1 - Symptomatic but completely ambulatory (Restricted in physically strenuous activity but ambulatory and able to carry out work of a light or sedentary nature. For example, light housework, office work)  2 - Symptomatic, <50% in bed during the day (Ambulatory and capable of all self care but unable to carry out any work activities. Up and about more than 50% of waking hours)  3 - Symptomatic, >50% in bed, but not bedbound (Capable of only limited self-care, confined to bed or chair 50% or more of waking hours)  4 - Bedbound (Completely disabled. Cannot carry on any self-care. Totally confined to bed or chair)  5 - Death   Eustace Pen MM, Creech RH, Tormey DC, et al. [phone]). "Toxicity and response criteria of the [facility]". [place] Oncol. 5 (6): 649-55    LABORATORY DATA:  Lab Results  Component Value Date   WBC 11.7 (H) 08/05/2020   HGB 9.7 (L) 08/05/2020   HCT 30.6 (L) 08/05/2020   MCV 86.1 08/05/2020   PLT 313.0 08/05/2020   Lab Results  Component Value Date    NA 137 08/05/2020   K 5.0 08/05/2020   CL 100 08/05/2020   CO2 31 08/05/2020   Lab Results  Component Value Date   ALT 9 08/05/2020   AST 12 08/05/2020   ALKPHOS 90 08/05/2020   BILITOT 0.3 08/05/2020      RADIOGRAPHY: MM CLIP PLACEMENT RIGHT  Result Date: 08/18/2020 CLINICAL DATA:  Post stereotactic guided biopsy of a suspicious 0.5 cm group of calcifications in the upper-outer right breast. EXAM: DIAGNOSTIC RIGHT MAMMOGRAM POST STEREOTACTIC BIOPSY COMPARISON:  Previous exams. FINDINGS: Mammographic images were obtained following stereotactic guided biopsy of calcifications in the upper-outer right breast. The biopsy marking clip is a coil shaped biopsy marking clip is present however felt to be displaced 6.6 cm medially from the site of the biopsied calcifications in the upper-outer right breast. IMPRESSION:  Coil shaped biopsy marking clip displaced 6.6 cm medial to the biopsied calcifications in the upper-outer right breast. Final Assessment: Post Procedure Mammograms for Marker Placement Electronically Signed   By: Everlean Alstrom M.D.   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 This was found to be concordant by Dr. Everlean Alstrom. Pathology results were discussed with the patient by telephone. The patient reported doing well after the biopsy with tenderness at the site. Post biopsy instructions and care were reviewed and questions were answered. The patient was encouraged to call [facility] for any additional concerns. Surgical consultation has been arranged with Dr. Autumn Messing at [facility] on August 26, 2020. Consider bilateral breast MRI given high grade  histology. Pathology results reported by Stacie Acres RN on 08/19/2020. Electronically Signed   By: Everlean Alstrom M.D.   On: 08/19/2020 12:56   Result Date: 08/19/2020 CLINICAL DATA:  75 year old female with an indeterminate 0.5 cm group of calcifications in the upper-outer right breast. EXAM: BREAST STEREOTACTIC CORE NEEDLE BIOPSY COMPARISON:  Previous exams. FINDINGS: The patient and I discussed the procedure of stereotactic-guided biopsy including benefits and alternatives. We discussed the high likelihood of a successful procedure. We discussed the risks of the procedure including infection, bleeding, tissue injury, clip migration, and inadequate sampling. Informed written consent was given. The usual time out protocol was performed immediately prior to the procedure. Using sterile technique and 1% Lidocaine as local anesthetic, under stereotactic guidance, a 9 gauge vacuum assisted device was used to perform core needle biopsy of the calcifications in the upper-outer right breast using a lateral to medial approach. Specimen radiograph was performed showing the presence of calcifications. Specimens with calcifications are identified for pathology. Lesion quadrant: Upper-outer At the conclusion of the procedure, a coil shaped tissue marker clip was deployed into the biopsy cavity. Follow-up 2-view mammogram was performed and dictated separately. IMPRESSION: Stereotactic-guided biopsy of the calcifications in the upper-outer right breast. No apparent complications. Electronically Signed: By: Everlean Alstrom M.D. On: 08/18/2020 08:59       IMPRESSION/PLAN: 1. High Grade, ER/PR positive DCIS of the right breast. Dr. Lisbeth Renshaw discusses the pathology findings and reviews the nature of right breast disease. The consensus from the breast conference includes breast conservation with lumpectomy. Following surgery she would benefit from external radiotherapy to the breast  to reduce risks of local recurrence followed  by antiestrogen therapy. We discussed the risks, benefits, short, and long term effects of radiotherapy, as well as the curative intent, and the patient is interested in proceeding. Dr. Lisbeth Renshaw discusses the delivery and logistics of radiotherapy and anticipates a course of 4 weeks of radiotherapy. We will see her back a few weeks after surgery to discuss the simulation process and anticipate we starting radiotherapy about 4-6 weeks after surgery.    Given current concerns for patient exposure during the COVID-19 pandemic, this encounter was conducted via telephone.  The patient has provided two factor identification and has given verbal consent for this type of encounter and has been advised to only accept a meeting of this type in a secure network environment. The time spent during this encounter was 60 minutes including preparation, discussion, and coordination of the  patient's care. The attendants for this meeting include Blenda Nicely, RN, Dr. Lisbeth Renshaw, Hayden Pedro  and [place].  During the encounter,  Blenda Nicely, RN, Dr. Lisbeth Renshaw, and Hayden Pedro were located at [facility] Radiation Oncology Department.  Penny Gibson was located at home.      Carola Rhine, [facility]    **Disclaimer: This note was dictated with voice recognition software. Similar sounding words can inadvertently be transcribed and this note may contain transcription errors which may not have been corrected upon publication of note.**

## 2020-09-10 ENCOUNTER — Encounter: Payer: Self-pay | Admitting: *Deleted

## 2020-09-10 ENCOUNTER — Inpatient Hospital Stay: Payer: Medicare Other | Admitting: Hematology and Oncology

## 2020-09-10 NOTE — Progress Notes (Signed)
[place] Psychosocial Distress Screening Clinical Social Work  Clinical Social Work was referred by distress screening protocol.  The patient scored a 8 on the Psychosocial Distress Thermometer which indicates moderate distress. Clinical Social Worker contacted patient by phone to assess for distress and other psychosocial needs.  Patient stated she was doing "ok" after meeting with radiation oncology and getting additional information on her treatment plan.  CSW provided education on CSW role and support services at [facility].  Patient stated only concern was transportation due to not being able to drive herself, but has transportation resources she can use for her appointments (friend and [employer]).  CSW also provided information on [place] transportation, and patient plans to call if she has transportation needs.  CSW provided contact number and encouraged patient to call with questions or concerns.         ONCBCN DISTRESS SCREENING 09/09/2020  Screening Type Initial Screening  Distress experienced in past week (1-10) 8  Emotional problem type Nervousness/Anxiety;Adjusting to illness  Information Concerns Type Lack of info about diagnosis  Other Contact via phone    Johnnye Lana, MSW, LCSW, OSW-C Clinical Social Worker [facility] [phone]

## 2020-09-11 ENCOUNTER — Other Ambulatory Visit: Payer: Self-pay

## 2020-09-11 ENCOUNTER — Encounter (HOSPITAL_BASED_OUTPATIENT_CLINIC_OR_DEPARTMENT_OTHER): Payer: Self-pay | Admitting: General Surgery

## 2020-09-11 ENCOUNTER — Other Ambulatory Visit: Payer: Self-pay | Admitting: *Deleted

## 2020-09-11 DIAGNOSIS — Z170 Estrogen receptor positive status [ER+]: Secondary | ICD-10-CM

## 2020-09-11 DIAGNOSIS — C50411 Malignant neoplasm of upper-outer quadrant of right female breast: Secondary | ICD-10-CM

## 2020-09-16 ENCOUNTER — Other Ambulatory Visit (HOSPITAL_COMMUNITY)
Admission: RE | Admit: 2020-09-16 | Discharge: 2020-09-16 | Disposition: A | Discharge status: Home / Self Care | Payer: Medicare Other | Source: Ambulatory Visit | Attending: General Surgery | Admitting: General Surgery

## 2020-09-16 DIAGNOSIS — Z20822 Contact with and (suspected) exposure to covid-19: Secondary | ICD-10-CM | POA: Diagnosis not present

## 2020-09-16 DIAGNOSIS — Z01812 Encounter for preprocedural laboratory examination: Secondary | ICD-10-CM | POA: Diagnosis not present

## 2020-09-16 LAB — SARS CORONAVIRUS 2 (TAT 6-24 HRS): SARS Coronavirus 2: NEGATIVE

## 2020-09-16 NOTE — Progress Notes (Signed)

## 2020-09-18 ENCOUNTER — Other Ambulatory Visit: Payer: Self-pay

## 2020-09-18 ENCOUNTER — Ambulatory Visit
Admission: RE | Admit: 2020-09-18 | Discharge: 2020-09-18 | Disposition: A | Discharge status: Home / Self Care | Payer: Medicare Other | Source: Ambulatory Visit | Attending: General Surgery | Admitting: General Surgery

## 2020-09-18 DIAGNOSIS — D0511 Intraductal carcinoma in situ of right breast: Secondary | ICD-10-CM

## 2020-09-18 DIAGNOSIS — R928 Other abnormal and inconclusive findings on diagnostic imaging of breast: Secondary | ICD-10-CM | POA: Diagnosis not present

## 2020-09-18 NOTE — Anesthesia Preprocedure Evaluation (Addendum)
Anesthesia Evaluation  Patient identified by MRN, date of birth, ID band Patient awake    Reviewed: Allergy & Precautions, NPO status , Patient's Chart, lab work & pertinent test results  History of Anesthesia Complications Negative for: history of anesthetic complications  Airway Mallampati: II  TM Distance: >3 FB Neck ROM: Full    Dental  (+) Edentulous Lower, Edentulous Upper   Pulmonary asthma ,    Pulmonary exam normal        Cardiovascular hypertension, Pt. on medications Normal cardiovascular exam     Neuro/Psych negative neurological ROS  negative psych ROS   GI/Hepatic negative GI ROS, Neg liver ROS,   Endo/Other  diabetes, Type 2Morbid obesity  Renal/GU negative Renal ROS  negative genitourinary   Musculoskeletal  (+) Arthritis ,   Abdominal   Peds  Hematology  (+) anemia , Hgb 9.7 on 08/05/20   Anesthesia Other Findings Right breast ca  Reproductive/Obstetrics negative OB ROS                            Anesthesia Physical Anesthesia Plan  ASA: II  Anesthesia Plan: General   Post-op Pain Management:    Induction: Intravenous  PONV Risk Score and Plan: 3 and Treatment may vary due to age or medical condition, Ondansetron and Dexamethasone  Airway Management Planned: LMA  Additional Equipment: None  Intra-op Plan:   Post-operative Plan: Extubation in OR  Informed Consent: I have reviewed the patients History and Physical, chart, labs and discussed the procedure including the risks, benefits and alternatives for the proposed anesthesia with the patient or authorized representative who has indicated his/her understanding and acceptance.     Dental advisory given  Plan Discussed with: CRNA  Anesthesia Plan Comments:        Anesthesia Quick Evaluation

## 2020-09-19 ENCOUNTER — Ambulatory Visit (HOSPITAL_COMMUNITY)
Admission: RE | Admit: 2020-09-19 | Discharge: 2020-09-19 | Disposition: A | Discharge status: Home / Self Care | Payer: Medicare Other | Attending: General Surgery | Admitting: General Surgery

## 2020-09-19 ENCOUNTER — Encounter (HOSPITAL_BASED_OUTPATIENT_CLINIC_OR_DEPARTMENT_OTHER)
Admission: RE | Disposition: A | Discharge status: Home / Self Care | Payer: Self-pay | Source: Home / Self Care | Attending: General Surgery

## 2020-09-19 ENCOUNTER — Ambulatory Visit
Admission: RE | Admit: 2020-09-19 | Discharge: 2020-09-19 | Disposition: A | Discharge status: Home / Self Care | Payer: Medicare Other | Source: Ambulatory Visit | Attending: General Surgery | Admitting: General Surgery

## 2020-09-19 ENCOUNTER — Ambulatory Visit (HOSPITAL_BASED_OUTPATIENT_CLINIC_OR_DEPARTMENT_OTHER): Payer: Medicare Other | Admitting: Anesthesiology

## 2020-09-19 ENCOUNTER — Encounter (HOSPITAL_BASED_OUTPATIENT_CLINIC_OR_DEPARTMENT_OTHER): Payer: Self-pay | Admitting: General Surgery

## 2020-09-19 ENCOUNTER — Other Ambulatory Visit: Payer: Self-pay

## 2020-09-19 DIAGNOSIS — E119 Type 2 diabetes mellitus without complications: Secondary | ICD-10-CM | POA: Diagnosis not present

## 2020-09-19 DIAGNOSIS — Z800 Family history of malignant neoplasm of digestive organs: Secondary | ICD-10-CM | POA: Insufficient documentation

## 2020-09-19 DIAGNOSIS — Z803 Family history of malignant neoplasm of breast: Secondary | ICD-10-CM | POA: Insufficient documentation

## 2020-09-19 DIAGNOSIS — Z7982 Long term (current) use of aspirin: Secondary | ICD-10-CM | POA: Insufficient documentation

## 2020-09-19 DIAGNOSIS — N6021 Fibroadenosis of right breast: Secondary | ICD-10-CM | POA: Diagnosis not present

## 2020-09-19 DIAGNOSIS — Z79899 Other long term (current) drug therapy: Secondary | ICD-10-CM | POA: Diagnosis not present

## 2020-09-19 DIAGNOSIS — D0511 Intraductal carcinoma in situ of right breast: Secondary | ICD-10-CM

## 2020-09-19 DIAGNOSIS — Z170 Estrogen receptor positive status [ER+]: Secondary | ICD-10-CM | POA: Insufficient documentation

## 2020-09-19 DIAGNOSIS — N6489 Other specified disorders of breast: Secondary | ICD-10-CM | POA: Diagnosis not present

## 2020-09-19 DIAGNOSIS — C50411 Malignant neoplasm of upper-outer quadrant of right female breast: Secondary | ICD-10-CM | POA: Diagnosis not present

## 2020-09-19 DIAGNOSIS — E785 Hyperlipidemia, unspecified: Secondary | ICD-10-CM | POA: Diagnosis not present

## 2020-09-19 DIAGNOSIS — R928 Other abnormal and inconclusive findings on diagnostic imaging of breast: Secondary | ICD-10-CM | POA: Diagnosis not present

## 2020-09-19 DIAGNOSIS — I10 Essential (primary) hypertension: Secondary | ICD-10-CM | POA: Diagnosis not present

## 2020-09-19 DIAGNOSIS — D509 Iron deficiency anemia, unspecified: Secondary | ICD-10-CM | POA: Diagnosis not present

## 2020-09-19 HISTORY — PX: BREAST LUMPECTOMY WITH RADIOACTIVE SEED LOCALIZATION: SHX6424

## 2020-09-19 HISTORY — PX: BREAST SURGERY: SHX581

## 2020-09-19 SURGERY — BREAST LUMPECTOMY WITH RADIOACTIVE SEED LOCALIZATION
Anesthesia: General | Site: Breast | Laterality: Right

## 2020-09-19 MED ORDER — FENTANYL CITRATE (PF) 100 MCG/2ML IJ SOLN: 25.0000 ug | INTRAMUSCULAR | Status: DC | PRN

## 2020-09-19 MED ORDER — LIDOCAINE 2% (20 MG/ML) 5 ML SYRINGE
INTRAMUSCULAR | Status: AC
  Filled 2020-09-19: qty 5

## 2020-09-19 MED ORDER — VANCOMYCIN HCL IN DEXTROSE 1-5 GM/200ML-% IV SOLN
1000.0000 mg | INTRAVENOUS | Status: AC
  Administered 2020-09-19: 1000 mg via INTRAVENOUS

## 2020-09-19 MED ORDER — CELECOXIB 200 MG PO CAPS
200.0000 mg | ORAL_CAPSULE | ORAL | Status: AC
  Administered 2020-09-19: 200 mg via ORAL

## 2020-09-19 MED ORDER — FENTANYL CITRATE (PF) 100 MCG/2ML IJ SOLN
INTRAMUSCULAR | Status: DC | PRN
  Administered 2020-09-19 (×2): 50 ug via INTRAVENOUS

## 2020-09-19 MED ORDER — EPHEDRINE 5 MG/ML INJ
INTRAVENOUS | Status: AC
  Filled 2020-09-19: qty 10

## 2020-09-19 MED ORDER — PROPOFOL 10 MG/ML IV BOLUS
INTRAVENOUS | Status: DC | PRN
  Administered 2020-09-19: 50 mg via INTRAVENOUS
  Administered 2020-09-19: 170 mg via INTRAVENOUS

## 2020-09-19 MED ORDER — OXYCODONE HCL 5 MG/5ML PO SOLN: 5.0000 mg | Freq: Once | ORAL | Status: DC | PRN

## 2020-09-19 MED ORDER — PROMETHAZINE HCL 25 MG/ML IJ SOLN: 6.2500 mg | INTRAMUSCULAR | Status: DC | PRN

## 2020-09-19 MED ORDER — PROPOFOL 10 MG/ML IV BOLUS
INTRAVENOUS | Status: AC
  Filled 2020-09-19: qty 20

## 2020-09-19 MED ORDER — EPHEDRINE SULFATE-NACL 50-0.9 MG/10ML-% IV SOSY
PREFILLED_SYRINGE | INTRAVENOUS | Status: DC | PRN
  Administered 2020-09-19: 10 mg via INTRAVENOUS

## 2020-09-19 MED ORDER — CHLORHEXIDINE GLUCONATE CLOTH 2 % EX PADS: 6.0000 | MEDICATED_PAD | Freq: Once | CUTANEOUS | Status: DC

## 2020-09-19 MED ORDER — LIDOCAINE 2% (20 MG/ML) 5 ML SYRINGE
INTRAMUSCULAR | Status: DC | PRN
  Administered 2020-09-19: 100 mg via INTRAVENOUS

## 2020-09-19 MED ORDER — BUPIVACAINE-EPINEPHRINE (PF) 0.25% -1:200000 IJ SOLN
INTRAMUSCULAR | Status: DC | PRN
  Administered 2020-09-19: 20 mL

## 2020-09-19 MED ORDER — DEXAMETHASONE SODIUM PHOSPHATE 10 MG/ML IJ SOLN
INTRAMUSCULAR | Status: DC | PRN
  Administered 2020-09-19: 5 mg via INTRAVENOUS

## 2020-09-19 MED ORDER — HYDROCODONE-ACETAMINOPHEN 5-325 MG PO TABS: 1.0000 | ORAL_TABLET | Freq: Four times a day (QID) | ORAL | 0 refills | Status: DC | PRN

## 2020-09-19 MED ORDER — ACETAMINOPHEN 500 MG PO TABS
1000.0000 mg | ORAL_TABLET | ORAL | Status: AC
  Administered 2020-09-19: 1000 mg via ORAL

## 2020-09-19 MED ORDER — FENTANYL CITRATE (PF) 100 MCG/2ML IJ SOLN
INTRAMUSCULAR | Status: AC
  Filled 2020-09-19: qty 2

## 2020-09-19 MED ORDER — PHENYLEPHRINE 40 MCG/ML (10ML) SYRINGE FOR IV PUSH (FOR BLOOD PRESSURE SUPPORT)
PREFILLED_SYRINGE | INTRAVENOUS | Status: DC | PRN
  Administered 2020-09-19: 80 ug via INTRAVENOUS

## 2020-09-19 MED ORDER — GABAPENTIN 300 MG PO CAPS
300.0000 mg | ORAL_CAPSULE | ORAL | Status: AC
  Administered 2020-09-19: 300 mg via ORAL

## 2020-09-19 MED ORDER — ONDANSETRON HCL 4 MG/2ML IJ SOLN
INTRAMUSCULAR | Status: DC | PRN
  Administered 2020-09-19: 4 mg via INTRAVENOUS

## 2020-09-19 MED ORDER — VANCOMYCIN HCL IN DEXTROSE 1-5 GM/200ML-% IV SOLN
INTRAVENOUS | Status: AC
  Filled 2020-09-19: qty 200

## 2020-09-19 MED ORDER — LACTATED RINGERS IV SOLN: INTRAVENOUS | Status: DC

## 2020-09-19 MED ORDER — PHENYLEPHRINE 40 MCG/ML (10ML) SYRINGE FOR IV PUSH (FOR BLOOD PRESSURE SUPPORT)
PREFILLED_SYRINGE | INTRAVENOUS | Status: AC
  Filled 2020-09-19: qty 10

## 2020-09-19 MED ORDER — ACETAMINOPHEN 500 MG PO TABS
ORAL_TABLET | ORAL | Status: AC
  Filled 2020-09-19: qty 2

## 2020-09-19 MED ORDER — CELECOXIB 200 MG PO CAPS
ORAL_CAPSULE | ORAL | Status: AC
  Filled 2020-09-19: qty 1

## 2020-09-19 MED ORDER — GABAPENTIN 300 MG PO CAPS
ORAL_CAPSULE | ORAL | Status: AC
  Filled 2020-09-19: qty 1

## 2020-09-19 MED ORDER — OXYCODONE HCL 5 MG PO TABS: 5.0000 mg | ORAL_TABLET | Freq: Once | ORAL | Status: DC | PRN

## 2020-09-19 SURGICAL SUPPLY — 39 items
ADH SKN CLS APL DERMABOND .7 (GAUZE/BANDAGES/DRESSINGS) ×1
APL PRP STRL LF DISP 70% ISPRP (MISCELLANEOUS) ×1
APPLIER CLIP 9.375 MED OPEN (MISCELLANEOUS) ×2
APR CLP MED 9.3 20 MLT OPN (MISCELLANEOUS) ×1
BLADE SURG 15 STRL LF DISP TIS (BLADE) ×1 IMPLANT
BLADE SURG 15 STRL SS (BLADE) ×2
CANISTER SUC SOCK COL 7IN (MISCELLANEOUS) ×1 IMPLANT
CANISTER SUCT 1200ML W/VALVE (MISCELLANEOUS) ×2 IMPLANT
CHLORAPREP W/TINT 26 (MISCELLANEOUS) ×2 IMPLANT
CLIP APPLIE 9.375 MED OPEN (MISCELLANEOUS) IMPLANT
COVER BACK TABLE 60X90IN (DRAPES) ×2 IMPLANT
COVER MAYO STAND STRL (DRAPES) ×2 IMPLANT
COVER PROBE W GEL 5X96 (DRAPES) ×2 IMPLANT
DERMABOND ADVANCED (GAUZE/BANDAGES/DRESSINGS) ×1
DERMABOND ADVANCED .7 DNX12 (GAUZE/BANDAGES/DRESSINGS) ×1 IMPLANT
DRAPE LAPAROSCOPIC ABDOMINAL (DRAPES) ×2 IMPLANT
DRAPE UTILITY XL STRL (DRAPES) ×2 IMPLANT
ELECT COATED BLADE 2.86 ST (ELECTRODE) ×2 IMPLANT
ELECT REM PT RETURN 9FT ADLT (ELECTROSURGICAL) ×2
ELECTRODE REM PT RTRN 9FT ADLT (ELECTROSURGICAL) ×1 IMPLANT
GLOVE SURG ENC MOIS LTX SZ7.5 (GLOVE) ×3 IMPLANT
GOWN STRL REUS W/ TWL LRG LVL3 (GOWN DISPOSABLE) ×2 IMPLANT
GOWN STRL REUS W/TWL LRG LVL3 (GOWN DISPOSABLE) ×4
KIT MARKER MARGIN INK (KITS) ×2 IMPLANT
NDL HYPO 25X1 1.5 SAFETY (NEEDLE) IMPLANT
NEEDLE HYPO 25X1 1.5 SAFETY (NEEDLE) ×2 IMPLANT
NS IRRIG 1000ML POUR BTL (IV SOLUTION) ×1 IMPLANT
PACK BASIN DAY SURGERY FS (CUSTOM PROCEDURE TRAY) ×2 IMPLANT
PENCIL SMOKE EVACUATOR (MISCELLANEOUS) ×2 IMPLANT
SLEEVE SCD COMPRESS KNEE MED (STOCKING) ×2 IMPLANT
SPONGE LAP 18X18 RF (DISPOSABLE) ×2 IMPLANT
SUT MON AB 4-0 PC3 18 (SUTURE) ×2 IMPLANT
SUT SILK 2 0 SH (SUTURE) IMPLANT
SUT VICRYL 3-0 CR8 SH (SUTURE) ×2 IMPLANT
SYR CONTROL 10ML LL (SYRINGE) ×1 IMPLANT
TOWEL GREEN STERILE FF (TOWEL DISPOSABLE) ×2 IMPLANT
TRAY FAXITRON CT DISP (TRAY / TRAY PROCEDURE) ×2 IMPLANT
TUBE CONNECTING 20X1/4 (TUBING) ×1 IMPLANT
YANKAUER SUCT BULB TIP NO VENT (SUCTIONS) IMPLANT

## 2020-09-19 NOTE — Discharge Instructions (Signed)
May take Tylenol and Ibuprofen after 1:30pm, if needed.    Post Anesthesia Home Care Instructions  Activity: Get plenty of rest for the remainder of the day. A responsible individual must stay with you for 24 hours following the procedure.  For the next 24 hours, DO NOT: -Drive a car -Paediatric nurse -Drink alcoholic beverages -Take any medication unless instructed by your physician -Make any legal decisions or sign important papers.  Meals: Start with liquid foods such as gelatin or soup. Progress to regular foods as tolerated. Avoid greasy, spicy, heavy foods. If nausea and/or vomiting occur, drink only clear liquids until the nausea and/or vomiting subsides. Call your physician if vomiting continues.  Special Instructions/Symptoms: Your throat may feel dry or sore from the anesthesia or the breathing tube placed in your throat during surgery. If this causes discomfort, gargle with warm salt water. The discomfort should disappear within 24 hours.  If you had a scopolamine patch placed behind your ear for the management of post- operative nausea and/or vomiting:  1. The medication in the patch is effective for 72 hours, after which it should be removed.  Wrap patch in a tissue and discard in the trash. Wash hands thoroughly with soap and water. 2. You may remove the patch earlier than 72 hours if you experience unpleasant side effects which may include dry mouth, dizziness or visual disturbances. 3. Avoid touching the patch. Wash your hands with soap and water after contact with the patch.

## 2020-09-19 NOTE — Anesthesia Postprocedure Evaluation (Signed)
Anesthesia Post Note  Patient: Penny Gibson  Procedure(s) Performed: RIGHT BREAST LUMPECTOMY WITH RADIOACTIVE SEED LOCALIZATION (Right Breast)     Patient location during evaluation: PACU Anesthesia Type: General Level of consciousness: awake and alert and oriented Pain management: pain level controlled Vital Signs Assessment: post-procedure vital signs reviewed and stable Respiratory status: spontaneous breathing, nonlabored ventilation and respiratory function stable Cardiovascular status: blood pressure returned to baseline Postop Assessment: no apparent nausea or vomiting Anesthetic complications: no   No complications documented.  Last Vitals:  Vitals:   09/19/20 1030 09/19/20 1033  BP: 132/83   Pulse: 86 81  Resp: (!) 27 (!) 27  Temp:    SpO2: 100% 97%    Last Pain:  Vitals:   09/19/20 1030  TempSrc:   PainSc: 0-No pain                 Brennan Bailey

## 2020-09-19 NOTE — Transfer of Care (Signed)
Immediate Anesthesia Transfer of Care Note  Patient: Penny Gibson  Procedure(s) Performed: RIGHT BREAST LUMPECTOMY WITH RADIOACTIVE SEED LOCALIZATION (Right Breast)  Patient Location: PACU  Anesthesia Type:General  Level of Consciousness: drowsy and patient cooperative  Airway & Oxygen Therapy: Patient Spontanous Breathing and Patient connected to face mask oxygen  Post-op Assessment: Report given to RN and Post -op Vital signs reviewed and stable  Post vital signs: Reviewed and stable  Last Vitals:  Vitals Value Taken Time  BP 127/79 09/19/20 1015  Temp    Pulse 89 09/19/20 1018  Resp 33 09/19/20 1018  SpO2 100 % 09/19/20 1018  Vitals shown include unvalidated device data.  Last Pain:  Vitals:   09/19/20 1003  TempSrc:   PainSc: 0-No pain         Complications: No complications documented.

## 2020-09-19 NOTE — Op Note (Signed)
09/19/2020  9:56 AM  PATIENT:  Penny Gibson  75 y.o. female  PRE-OPERATIVE DIAGNOSIS:  RIGHT BREAST DCIS  POST-OPERATIVE DIAGNOSIS:  RIGHT BREAST DCIS  PROCEDURE:  Procedure(s): RIGHT BREAST LUMPECTOMY WITH RADIOACTIVE SEED LOCALIZATION (Right)  SURGEON:  Surgeon(s) and Role:    * [name], MD - Primary  PHYSICIAN ASSISTANT:   ASSISTANTS: none   ANESTHESIA:   local and general  EBL:  minimal   BLOOD ADMINISTERED:none  DRAINS: none   LOCAL MEDICATIONS USED:  MARCAINE     SPECIMEN:  Source of Specimen:  right breast tissue with additional deep margin  DISPOSITION OF SPECIMEN:  PATHOLOGY  COUNTS:  YES  TOURNIQUET:  * No tourniquets in log *  DICTATION: .Dragon Dictation   After informed consent was obtained the patient was brought to the operating room and placed in the supine position on the operating table.  After adequate induction of general anesthesia the patient's right breast was prepped with ChloraPrep, allowed to dry, and draped in usual sterile manner.  An appropriate timeout was performed.  Previously an I-125 seed was placed in the upper outer quadrant of the right breast to mark a small area of ductal carcinoma in situ.  The neoprobe was set to I-125 in the area of radioactivity was readily identified.  The area around this was infiltrated with quarter percent Marcaine.  A curvilinear incision was made far in the outer and upper aspect of the right breast with a 15 blade knife.  The incision was carried through the skin and subcutaneous tissue sharply with the electrocautery.  Dissection was then carried towards the radioactive seed under the direction of the neoprobe.  Once I more closely approached the radioactive seed I then removed a circular portion of breast tissue sharply with the electrocautery around the radioactive seed while checking the area of radioactivity frequently.  Once the specimen was removed it was oriented with the appropriate paint  colors.  A specimen radiograph was obtained that showed the seed to be near the center of the specimen.  The seed did appear to be close to the deep margin.  There was a small amount of deep tissue left between the cavity and the chest wall and this was also excised sharply with the electrocautery and marked appropriately.  There was no seed in the specimen and the seed had migrated 6 cm medially on the original films.  The tissue was then all sent to pathology for further evaluation.  Hemostasis was achieved using the Bovie electrocautery.  Several small vessels within the breast were controlled with clips.  The wound was irrigated with saline and infiltrated with more quarter percent Marcaine.  The deep layer of the wound was then closed with layers of interrupted 3-0 Vicryl stitches.  The skin was closed with a running 4-0 Monocryl subcuticular stitch.  Dermabond dressings were applied.  The patient tolerated the procedure well.  At the end of the case all needle sponge and instrument counts were correct.  The patient was then awakened and taken to recovery in stable condition.  PLAN OF CARE: Discharge to home after PACU  PATIENT DISPOSITION:  PACU - hemodynamically stable.   Delay start of Pharmacological VTE agent (>24hrs) due to surgical blood loss or risk of bleeding: not applicable

## 2020-09-19 NOTE — Anesthesia Procedure Notes (Signed)
Procedure Name: LMA Insertion Date/Time: 09/19/2020 9:05 AM Performed by: Gwyndolyn Saxon, CRNA Pre-anesthesia Checklist: Patient identified, Emergency Drugs available, Suction available and Patient being monitored Patient Re-evaluated:Patient Re-evaluated prior to induction Oxygen Delivery Method: Circle system utilized Preoxygenation: Pre-oxygenation with 100% oxygen Induction Type: IV induction Ventilation: Mask ventilation without difficulty LMA: LMA inserted LMA Size: 4.0 Number of attempts: 1 Placement Confirmation: positive ETCO2 and breath sounds checked- equal and bilateral Tube secured with: Tape Dental Injury: Teeth and Oropharynx as per pre-operative assessment

## 2020-09-19 NOTE — H&P (Signed)
Penny Gibson  Location: [facility] Surgery Patient #: [id number] DOB: Nov 17, 1945 Divorced / Language: English / Race: Black or African American Female   History of Present Illness  The patient is a 75 year old female who presents with breast cancer. We are asked to see the patient in consultation by Dr. Lorayne Marek to evaluate her for a new right breast cancer. The patient is a 75 year old black female who recently went for a routine screening mammogram. At that time she was found to have a 5 mm area of abnormal calcification in the upper outer quadrant of the right breast. 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 She does have a history of breast cancer and her sister and colon cancer and a cousin. She does not smoke.   Past Surgical History  Knee Surgery  Left.  Diagnostic Studies History  Colonoscopy  1-5 years ago Mammogram  within last year Pap Smear  never  Allergies  No Known Drug Allergies    Medication History Atorvastatin Calcium (20MG  Tablet, Oral) Active. Lisinopril (40MG  Tablet, Oral) Active. ZyrTEC Allergy (10MG  Tablet, Oral) Active. Aspirin (81MG  Tablet, Oral) Active. Tylenol (Oral) Specific strength unknown - Active. Medications Reconciled  Social History  Caffeine use  Coffee, Tea. No alcohol use  No drug use  Tobacco use  Never smoker.  Family History  Breast Cancer  Sister. Colon Cancer  Family Members In General. Hypertension  Mother.  Pregnancy / Birth History  Age at menarche  11 years. Age of menopause  31-55 Gravida  2 Irregular periods  Maternal age  48-25 Para  2  Other Problems  Arthritis  Asthma  Breast Cancer  High blood pressure  Hypercholesterolemia  Lump In Breast     Review of Systems General Present- Weight Gain. Not Present- Appetite Loss, Chills, Fatigue, Fever, Night Sweats and Weight Loss. Skin Present- Change in Wart/Mole.  Not Present- Dryness, Hives, Jaundice, New Lesions, Non-Healing Wounds, Rash and Ulcer. HEENT Present- Seasonal Allergies and Wears glasses/contact lenses. Not Present- Earache, Hearing Loss, Hoarseness, Nose Bleed, Oral Ulcers, Ringing in the Ears, Sinus Pain, Sore Throat, Visual Disturbances and Yellow Eyes. Respiratory Present- Wheezing. Not Present- Bloody sputum, Chronic Cough, Difficulty Breathing and Snoring. Cardiovascular Not Present- Chest Pain, Difficulty Breathing Lying Down, Leg Cramps, Palpitations, Rapid Heart Rate, Shortness of Breath and Swelling of Extremities. Gastrointestinal Not Present- Abdominal Pain, Bloating, Bloody Stool, Change in Bowel Habits, Chronic diarrhea, Constipation, Difficulty Swallowing, Excessive gas, Gets full quickly at meals, Hemorrhoids, Indigestion, Nausea, Rectal Pain and Vomiting. Female Genitourinary Present- Nocturia. Not Present- Frequency, Painful Urination, Pelvic Pain and Urgency. Musculoskeletal Present- Joint Stiffness. Not Present- Back Pain, Joint Pain, Muscle Pain, Muscle Weakness and Swelling of Extremities. Neurological Not Present- Decreased Memory, Fainting, Headaches, Numbness, Seizures, Tingling, Tremor, Trouble walking and Weakness. Psychiatric Not Present- Anxiety, Bipolar, Change in Sleep Pattern, Depression, Fearful and Frequent crying. Endocrine Present- New Diabetes. Not Present- Cold Intolerance, Excessive Hunger, Hair Changes, Heat Intolerance and Hot flashes. Hematology Not Present- Blood Thinners, Easy Bruising, Excessive bleeding, Gland problems, HIV and Persistent Infections.  Vitals Weight: 214.38 lb Height: 61in Body Surface Area: 1.95 m Body Mass Index: 40.51 kg/m  Pulse: 99 (Regular)  BP: 144/86(Sitting, Left Arm, Standard)       Physical Exam  General Mental Status-Alert. General Appearance-Consistent with stated age. Hydration-Well hydrated. Voice-Normal.  Head and  Neck Head-normocephalic, atraumatic with no lesions or palpable masses. Trachea-midline. Thyroid Gland Characteristics - normal  size and consistency.  Eye Eyeball - Bilateral-Extraocular movements intact. Sclera/Conjunctiva - Bilateral-No scleral icterus.  Chest and Lung Exam Chest and lung exam reveals -quiet, even and easy respiratory effort with no use of accessory muscles and on auscultation, normal breath sounds, no adventitious sounds and normal vocal resonance. Inspection Chest Wall - Normal. Back - normal.  Breast Note: There is no palpable mass in either breast. There is no palpable axillary, supraclavicular, or cervical lymphadenopathy.   Cardiovascular Cardiovascular examination reveals -normal heart sounds, regular rate and rhythm with no murmurs and normal pedal pulses bilaterally.  Abdomen Inspection Inspection of the abdomen reveals - No Hernias. Skin - Scar - no surgical scars. Palpation/Percussion Palpation and Percussion of the abdomen reveal - Soft, Non Tender, No Rebound tenderness, No Rigidity (guarding) and No hepatosplenomegaly. Auscultation Auscultation of the abdomen reveals - Bowel sounds normal.  Neurologic Neurologic evaluation reveals -alert and oriented x 3 with no impairment of recent or remote memory. Mental Status-Normal.  Musculoskeletal Normal Exam - Left-Upper Extremity Strength Normal and Lower Extremity Strength Normal. Normal Exam - Right-Upper Extremity Strength Normal and Lower Extremity Strength Normal.  Lymphatic Head & Neck  General Head & Neck Lymphatics: Bilateral - Description - Normal. Axillary  General Axillary Region: Bilateral - Description - Normal. Tenderness - Non Tender. Femoral & Inguinal  Generalized Femoral & Inguinal Lymphatics: Bilateral - Description - Normal. Tenderness - Non Tender.    Assessment & Plan  DUCTAL CARCINOMA IN SITU (DCIS) OF RIGHT BREAST (D05.11) Impression: The  patient appears to have a small 5 mm area of ductal carcinoma in situ in the upper outer quadrant of the right breast. I have discussed with her the different options for treatment and at this point she favors breast conservation which I feel is a very reasonable way of treating her cancer. I have discussed with her in detail the risks and benefits of the operation as well as some of the technical aspects including use of a radioactive seed for localization and she understands and wishes to proceed. Her clip migrated far from the cancer so I have asked the radiologist if there is sufficient residual calcification to target for surgery. I will also refer her to medical and radiation oncology to discuss adjuvant therapy. We will proceed with surgical planning. This patient encounter took 60 minutes today to perform the following: take history, perform exam, review outside records, interpret imaging, counsel the patient on their diagnosis and document encounter, findings & plan in the EHR Current Plans Referred to Oncology, for evaluation and follow up (Oncology). Routine.

## 2020-09-19 NOTE — Interval H&P Note (Signed)
History and Physical Interval Note:  09/19/2020 8:35 AM  Penny Gibson  has presented today for surgery, with the diagnosis of RIGHT BREAST DCIS.  The various methods of treatment have been discussed with the patient and family. After consideration of risks, benefits and other options for treatment, the patient has consented to  Procedure(s): RIGHT BREAST LUMPECTOMY WITH RADIOACTIVE SEED LOCALIZATION (Right) as a surgical intervention.  The patient's history has been reviewed, patient examined, no change in status, stable for surgery.  I have reviewed the patient's chart and labs.  Questions were answered to the patient's satisfaction.     Autumn Messing III

## 2020-09-22 ENCOUNTER — Encounter (HOSPITAL_BASED_OUTPATIENT_CLINIC_OR_DEPARTMENT_OTHER): Payer: Self-pay | Admitting: General Surgery

## 2020-09-22 NOTE — Addendum Note (Signed)
Addendum  created 09/22/20 0949 by Tawni Millers, CRNA   Charge Capture section accepted

## 2020-09-25 ENCOUNTER — Encounter: Payer: Self-pay | Admitting: *Deleted

## 2020-09-26 LAB — SURGICAL PATHOLOGY

## 2020-09-29 ENCOUNTER — Encounter: Payer: Self-pay | Admitting: *Deleted

## 2020-09-30 ENCOUNTER — Ambulatory Visit: Payer: Medicare Other | Admitting: Orthopaedic Surgery

## 2020-10-02 ENCOUNTER — Inpatient Hospital Stay: Payer: Medicare Other | Admitting: Nurse Practitioner

## 2020-10-06 ENCOUNTER — Encounter: Payer: Self-pay | Admitting: Radiation Oncology

## 2020-10-06 ENCOUNTER — Other Ambulatory Visit: Payer: Self-pay

## 2020-10-06 ENCOUNTER — Telehealth: Payer: Self-pay

## 2020-10-06 NOTE — Telephone Encounter (Signed)
Spoke with patient in regards to telephone appointment with Shona Simpson PA on 10/14/20 @ 10:30am. Patient verbalized understanding of appointment date and time. Reviewed meaningful use questions. TM

## 2020-10-14 ENCOUNTER — Ambulatory Visit
Admission: RE | Admit: 2020-10-14 | Discharge: 2020-10-14 | Disposition: A | Discharge status: Home / Self Care | Payer: Medicare Other | Source: Ambulatory Visit | Attending: Radiation Oncology | Admitting: Radiation Oncology

## 2020-10-14 ENCOUNTER — Other Ambulatory Visit: Payer: Self-pay

## 2020-10-14 DIAGNOSIS — Z170 Estrogen receptor positive status [ER+]: Secondary | ICD-10-CM | POA: Insufficient documentation

## 2020-10-14 DIAGNOSIS — C50411 Malignant neoplasm of upper-outer quadrant of right female breast: Secondary | ICD-10-CM | POA: Diagnosis not present

## 2020-10-14 DIAGNOSIS — Z510 Encounter for antineoplastic radiation therapy: Secondary | ICD-10-CM | POA: Insufficient documentation

## 2020-10-14 NOTE — Progress Notes (Signed)
Ms. Lupe states that she will be avalible for a telephone appointment today at 130.

## 2020-10-14 NOTE — Progress Notes (Unsigned)
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 These calcifications are seen within the upper outer quadrant at posterior depth. In the left breast, no findings suspicious for malignancy. The images were evaluated with computer-aided detection. IMPRESSION: Further evaluation is suggested for calcifications in the right breast.   08/08/2020 Mammogram   Diagnostic unilateral right mammogram FINDINGS: There is a 5 mm group of pleomorphic calcifications in the slightly upper outer right breast at posterior depth. These have an indeterminate morphology.   IMPRESSION: Indeterminate 5 mm group of calcifications in the upper-outer right breast. Recommendation is for stereotactic biopsy.   08/18/2020 Cancer Staging   Staging form: Breast, AJCC 8th Edition - Clinical stage from 08/18/2020: Stage 0 (cTis (DCIS), cN0, cM0, ER+, PR+) - Signed by Gardenia Phlegm, NP on 08/27/2020 Stage prefix: Initial diagnosis Cancer Staging Malignant neoplasm of upper-outer quadrant of right breast in female, estrogen receptor positive ([place]) Staging form: Breast, AJCC 8th Edition - Clinical stage from 08/18/2020: Stage 0 (cTis (DCIS), cN0, cM0, ER+, PR+) - Signed by Gardenia Phlegm, NP on 08/27/2020 Stage prefix: Initial diagnosis - Pathologic stage from 09/19/2020: Stage Unknown (pTis (DCIS), pNX, cM0, ER+,  PR+, HER2: Not Assessed) - Signed by Alla Feeling, NP on 10/15/2020 Nuclear grade: G2     08/18/2020 Initial Biopsy   Diagnosis Breast, right, needle core biopsy, UOQ - DUCTAL CARCINOMA IN SITU WITH CALCIFICATIONS AND NECROSIS, SEE COMMENT. - SCLEROSING ADENOSIS WITH CALCIFICATIONS. Microscopic Comment The carcinoma appears high grade. PROGNOSTIC INDICATORS Results: IMMUNOHISTOCHEMICAL AND MORPHOMETRIC ANALYSIS PERFORMED MANUALLY Estrogen Receptor: 95%, POSITIVE, STRONG STAINING INTENSITY Progesterone Receptor: 95%, POSITIVE, STRONG STAINING INTENSITY   08/27/2020 Initial Diagnosis   Malignant neoplasm of upper-outer quadrant of right breast in female, estrogen receptor positive ([place])   09/19/2020 Definitive Surgery   Right breast lumpectomy with radioactive seed localization by Dr. Autumn Messing   09/19/2020 Pathology Results   FINAL MICROSCOPIC DIAGNOSIS:   A. BREAST, RIGHT, LUMPECTOMY:  -  Ductal carcinoma in situ with calcifications, intermediate grade, 1.5  cm  -  Margins uninvolved by carcinoma (0.3; anterior margin)  -  Previous biopsy site changes  -  Adenosis with calcifications  -  See oncology table and comment below   B. BREAST, RIGHT ADDITIONAL DEEP MARGIN, EXCISION:  -  No residual ductal carcinoma in situ identified  Procedure: Excision  Specimen Laterality: Right  Histologic Type: Ductal carcinoma in situ  Size of DCIS: 1.5 cm (gross measurement)  Nuclear Grade: G2, intermediate  Necrosis: Not identified  Margins: All margins negative for DCIS       Specify Closest Margin (required only if <61mm): 3, anterior  Regional Lymph Nodes: Not applicable (no lymph nodes submitted or found)  Breast Biomarker Testing Performed on Previous Biopsy:       Testing performed on Case Number: [id number]       Estrogen Receptor: POSITIVE, 95% STRONG       Progesterone Receptor: POSITIVE, 95% STRONG  Pathologic Stage Classification (  pTNM, AJCC 8th Edition): pTis, f not   assigned     Radiation Therapy   Pending adjuvant radiation per Dr. Lisbeth Renshaw     HISTORY OF PRESENTING ILLNESS:  Penny Gibson 76 y.o. female with PMH including DM, HL, HTN, asthma, and OA known to our clinic as a former patient of Dr. Lebron Conners for iron deficiency anemia, is here because of recently diagnosed right breast DCIS.  Routine screening mammogram on 07/31/2020 showed calcifications in the upper outer quadrant of the right breast, left breast was negative.  Diagnostic right mammo 08/08/2020 showed indeterminate 5 mm group of calcifications in the upper outer quadrant.  Path 08/18/2020 showed high-grade ductal carcinoma in situ with calcifications and necrosis, ER 95% and PR 95% positive both with strong staining intensity.  She was seen by Dr. Marlou Starks and underwent right breast lumpectomy with seed localization on 09/19/2020, surgical path showed DCIS with calcifications, intermediate grade spanning 1.5 cm, anterior margin was close 3 mm but all clear. This was staged pTis, ER/PR strongly positive.  She was seen by Worthy Flank, PA and Dr. Lisbeth Renshaw on 4/26 to discuss adjuvant radiation.  She was referred to [place] to discuss adjuvant therapy and surveillance.  GYN HISTORY  Menarchal: age 60 LMP: age [age] HRT: xx  G2P2  Socially,   Family history - sister (breast), colon (family members)  Today, she    MEDICAL HISTORY:  Past Medical History:  Diagnosis Date  . Anemia   . Arthritis   . Asthma   . Constipation   . Diabetes mellitus without complication ([place])   . Dyspnea   . Hyperlipidemia   . Hypertension   . Insomnia   . Joint pain   . Knee pain   . Lactose intolerance   . Lower extremity edema   . Multiple food allergies   . Osteoarthritis   . Sinus problem     SURGICAL HISTORY: Past Surgical History:  Procedure Laterality Date  . BREAST BIOPSY Left 2010  . BREAST LUMPECTOMY Left 2010  . BREAST LUMPECTOMY WITH RADIOACTIVE SEED LOCALIZATION Right 09/19/2020   Procedure:  RIGHT BREAST LUMPECTOMY WITH RADIOACTIVE SEED LOCALIZATION;  Surgeon: [name], MD;  Location: [facility];  Service: General;  Laterality: Right;  . EYE SURGERY Bilateral    cataract surgery  . 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 TOTAL KNEE ARTHROPLASTY Left 03/10/2020   Procedure: LEFT TOTAL KNEE ARTHROPLASTY;  Surgeon: Leandrew Koyanagi, MD;  Location: [facility];  Service: Orthopedics;  Laterality: Left;  . TUBAL LIGATION      SOCIAL HISTORY: Social History   Socioeconomic History  . Marital status: Divorced    Spouse name: Not on file  . Number of children: Not on file  . Years of education: Not on file  . Highest education level: Not on file  Occupational History  . Not on file  Tobacco Use  . Smoking status: Never Smoker  . Smokeless tobacco: Never Used  Vaping Use  . Vaping Use: Never used  Substance and Sexual Activity  . Alcohol use: No  . Drug use: No  . Sexual activity: Not Currently    Birth control/protection: Surgical  Other Topics Concern  . Not on file  Social History Narrative  . Not on file   Social Determinants of Health   Financial Resource Strain: Not on file  Food Insecurity: Not on file  Transportation Needs: Not on file  Physical Activity: Not on file  Stress: Not on file  Social Connections: Not on file  Intimate Partner Violence: Not At Risk  . Fear of Current or Ex-Partner: No  . Emotionally Abused: No  . Physically Abused: No  . Sexually Abused: No    FAMILY HISTORY: Family History  Problem Relation Age of Onset  . Heart attack Mother   . Heart disease Mother   . Sudden death Mother   . Obesity Mother   . Cancer Sister   . Breast cancer Sister 87  . Stroke Maternal Grandmother   . Sudden death Father     ALLERGIES:  is allergic to feraheme [ferumoxytol], penicillins, and citric acid.  MEDICATIONS:  Current Outpatient Medications  Medication Sig Dispense Refill  . albuterol (PROAIR HFA) 108  (90 Base) MCG/ACT inhaler Inhale 1-2 puffs into the lungs every 6 (six) hours as needed for wheezing or shortness of breath. 6.7 g 0  . aspirin EC 81 MG tablet Take 1 tablet (81 mg total) by mouth 2 (two) times daily. 84 tablet 0  . atorvastatin (LIPITOR) 20 MG tablet Take 1 tablet (20 mg total) by mouth at bedtime. (Patient not taking: Reported on 10/06/2020) 90 tablet 3  . cetirizine (ZYRTEC) 10 MG tablet Take 10 mg by mouth daily.    Marland Kitchen HYDROcodone-acetaminophen (NORCO/VICODIN) 5-325 MG tablet Take 1-2 tablets by mouth every 6 (six) hours as needed for moderate pain or severe pain. (Patient not taking: Reported on 10/06/2020) 15 tablet 0  . lisinopril (ZESTRIL) 40 MG tablet Take 1 tablet (40 mg total) by mouth daily. 90 tablet 3   No current facility-administered medications for this visit.    REVIEW OF SYSTEMS:   Constitutional: Denies fevers, chills or abnormal night sweats Eyes: Denies blurriness of vision, double vision or watery eyes Ears, nose, mouth, throat, and face: Denies mucositis or sore throat Respiratory: Denies cough, dyspnea or wheezes Cardiovascular: Denies palpitation, chest discomfort or lower extremity swelling Gastrointestinal:  Denies nausea, heartburn or change in bowel habits Skin: Denies abnormal skin rashes Lymphatics: Denies new lymphadenopathy or easy bruising Neurological:Denies numbness, tingling or new weaknesses Behavioral/Psych: Mood is stable, no new changes  All other systems were reviewed with the patient and are negative.  PHYSICAL EXAMINATION: ECOG PERFORMANCE STATUS: {CHL ONC ECOG PS:[id number]}  There were no vitals filed for this visit. There were no vitals filed for this visit.  GENERAL:alert, no distress and comfortable SKIN: skin color, texture, turgor are normal, no rashes or significant lesions EYES: normal, conjunctiva are pink and non-injected, sclera clear OROPHARYNX:no exudate, no erythema and lips, buccal mucosa, and tongue normal   NECK: supple, thyroid normal size, non-tender, without nodularity LYMPH:  no palpable lymphadenopathy in the cervical, axillary or inguinal LUNGS: clear to auscultation and percussion with normal breathing effort HEART: regular rate & rhythm and no murmurs and no lower extremity edema ABDOMEN:abdomen soft, non-tender and normal bowel sounds Musculoskeletal:no cyanosis of digits and no clubbing  PSYCH: alert & oriented x 3 with fluent speech NEURO: no focal motor/sensory deficits  LABORATORY DATA:  I have reviewed the data as listed CBC Latest Ref Rng & Units 08/05/2020 03/14/2020 03/07/2020  WBC 4.0 - 10.5 K/uL 11.7(H) 12.9(H) 11.1(H)  Hemoglobin 12.0 - 15.0 g/dL 9.7(L) 8.7(L) 11.1(L)  Hematocrit 36.0 - 46.0 % 30.6(L) 28.9(L) 36.1  Platelets 150.0 - 400.0 K/uL 313.0 256 203    '@cmpl'$ @  RADIOGRAPHIC STUDIES: I have personally reviewed the radiological images as listed and agreed with the findings in the report. MM Breast Surgical  Specimen  Result Date: 09/19/2020 CLINICAL DATA:  Localization prior to surgery EXAM: SPECIMEN RADIOGRAPH OF THE RIGHT BREAST COMPARISON:  Previous exam(s). FINDINGS: Status post excision of the right breast. The radioactive seed and biopsy marker clip are present, completely intact, and were marked for pathology. IMPRESSION: Specimen radiograph of the right breast. Electronically Signed   By: Dorise Bullion III M.D   On: 09/19/2020 09:35   MM RT RADIOACTIVE SEED LOC MAMMO GUIDE  Result Date: 09/18/2020 CLINICAL DATA:  Localization prior to surgery EXAM: MAMMOGRAPHIC GUIDED RADIOACTIVE SEED LOCALIZATION OF THE RIGHT BREAST COMPARISON:  Previous exam(s). FINDINGS: Patient presents for radioactive seed localization prior to surgery. I met with the patient and we discussed the procedure of seed localization including benefits and alternatives. We discussed the high likelihood of a successful procedure. We discussed the risks of the procedure including infection,  bleeding, tissue injury and further surgery. We discussed the low dose of radioactivity involved in the procedure. Informed, written consent was given. The usual time-out protocol was performed immediately prior to the procedure. Using mammographic guidance, sterile technique, 1% lidocaine and an I-125 radioactive seed, the region of previous biopsy was localized using a lateral approach. The follow-up mammogram images confirm the seed in the expected location and were marked for the surgeon. Follow-up survey of the patient confirms presence of the radioactive seed. Order number of I-125 seed:  [id number]. Total activity:  8.184 millicurie reference Date: August 20, 2020 The patient tolerated the procedure well and was released from the [facility]. She was given instructions regarding seed removal. IMPRESSION: Radioactive seed localization right breast. No apparent complications. Electronically Signed   By: Dorise Bullion III M.D   On: 09/18/2020 13:54    ASSESSMENT & PLAN:  *** No orders of the defined types were placed in this encounter.   All questions were answered. The patient knows to call the clinic with any problems, questions or concerns. I spent {CHL ONC TIME VISIT - CRFVO:[id number]} counseling the patient face to face. The total time spent in the appointment was {CHL ONC TIME VISIT - BTCYE:[id number]} and more than 50% was on counseling.     Alla Feeling, NP 10/15/2020 9:24 AM

## 2020-10-14 NOTE — Progress Notes (Signed)
Radiation Oncology         [phone] ________________________________  Outpatient Follow Up - Conducted via telephone due to current COVID-19 concerns for limiting patient exposure  I spoke with the patient to conduct this consult visit via telephone to spare the patient unnecessary potential exposure in the healthcare setting during the current COVID-19 pandemic. The patient was notified in advance and was offered a [place] meeting to allow for face to face communication but unfortunately reported that they did not have the appropriate resources/technology to support such a visit and instead preferred to proceed with a telephone visit.   Name: Penny Gibson        MRN: [id number]  Date of Service: 10/14/2020 DOB: Apr 20, 1946  BT:DVVO, [name], NP  [name], MD     REFERRING PHYSICIAN: Autumn Messing III, MD   DIAGNOSIS: The encounter diagnosis was Malignant neoplasm of upper-outer quadrant of right breast in female, estrogen receptor positive ([place]).   HISTORY OF PRESENT ILLNESS: Penny Gibson is a 75 y.o. female  with a  right breast cancer. The patient was originally found to have screening detected calcifications in the right breast and has a family history of a sister who had breast cancer.  The abnormality was seen in the upper outer quadrant and further diagnostic imaging with mammography showed a 5 mm grouping of calcifications in the upper outer quadrant.  Ultrasound was not performed.  She underwent a stereotactic biopsy on 08/18/2020 that showed high-grade DCIS with calcifications and necrosis her tumor was ER/PR positive.  She has undergone right lumpectomy on 09/19/20 that revealed an intermediate grade DCIS measuring 1.5 cm. Her margins were clear. She's contacted today to discuss adjuvant radiotherapy.   PREVIOUS RADIATION THERAPY: No   PAST MEDICAL HISTORY:  Past Medical History:  Diagnosis Date  . Anemia   . Arthritis   . Asthma   . Constipation   .  Diabetes mellitus without complication ([place])   . Dyspnea   . Hyperlipidemia   . Hypertension   . Insomnia   . Joint pain   . Knee pain   . Lactose intolerance   . Lower extremity edema   . Multiple food allergies   . Osteoarthritis   . Sinus problem        PAST SURGICAL HISTORY: Past Surgical History:  Procedure Laterality Date  . BREAST BIOPSY Left 2010  . BREAST LUMPECTOMY Left 2010  . BREAST LUMPECTOMY WITH RADIOACTIVE SEED LOCALIZATION Right 09/19/2020   Procedure: RIGHT BREAST LUMPECTOMY WITH RADIOACTIVE SEED LOCALIZATION;  Surgeon: [name], MD;  Location: [facility];  Service: General;  Laterality: Right;  . EYE SURGERY Bilateral    cataract surgery  . 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 TOTAL KNEE ARTHROPLASTY Left 03/10/2020   Procedure: LEFT TOTAL KNEE ARTHROPLASTY;  Surgeon: Leandrew Koyanagi, MD;  Location: [facility];  Service: Orthopedics;  Laterality: Left;  . TUBAL LIGATION       FAMILY HISTORY:  Family History  Problem Relation Age of Onset  . Heart attack Mother   . Heart disease Mother   . Sudden death Mother   . Obesity Mother   . Cancer Sister   . Breast cancer Sister 10  . Stroke Maternal Grandmother   . Sudden death Father      SOCIAL HISTORY:  reports that she has never smoked. She has never used smokeless tobacco. She reports that she does not drink alcohol and  does not use drugs. The patient is divorced and lives in [place]. She enjoys painting landscapes with acrylic.    ALLERGIES: Feraheme [ferumoxytol], Penicillins, and Citric acid   MEDICATIONS:  Current Outpatient Medications  Medication Sig Dispense Refill  . albuterol (PROAIR HFA) 108 (90 Base) MCG/ACT inhaler Inhale 1-2 puffs into the lungs every 6 (six) hours as needed for wheezing or shortness of breath. 6.7 g 0  . aspirin EC 81 MG tablet Take 1 tablet (81 mg total) by mouth 2 (two) times daily. 84 tablet 0  . cetirizine (ZYRTEC) 10 MG tablet  Take 10 mg by mouth daily.    Marland Kitchen lisinopril (ZESTRIL) 40 MG tablet Take 1 tablet (40 mg total) by mouth daily. 90 tablet 3  . atorvastatin (LIPITOR) 20 MG tablet Take 1 tablet (20 mg total) by mouth at bedtime. (Patient not taking: Reported on 10/06/2020) 90 tablet 3  . HYDROcodone-acetaminophen (NORCO/VICODIN) 5-325 MG tablet Take 1-2 tablets by mouth every 6 (six) hours as needed for moderate pain or severe pain. (Patient not taking: Reported on 10/06/2020) 15 tablet 0   No current facility-administered medications for this encounter.     REVIEW OF SYSTEMS: On review of systems, the patient reports that she is doing well and denies any concerns with her breast healing other than some mild soreness. She denies any fullness of the breast, redness or drainage. No other complaints are verbalized.    PHYSICAL EXAM:  Wt Readings from Last 3 Encounters:  09/19/20 215 lb 13.3 oz (97.9 kg)  09/09/20 214 lb (97.1 kg)  07/18/20 211 lb 6.4 oz (95.9 kg)   Unable to assess due to encounter type.    ECOG = 0  0 - Asymptomatic (Fully active, able to carry on all predisease activities without restriction)  1 - Symptomatic but completely ambulatory (Restricted in physically strenuous activity but ambulatory and able to carry out work of a light or sedentary nature. For example, light housework, office work)  2 - Symptomatic, <50% in bed during the day (Ambulatory and capable of all self care but unable to carry out any work activities. Up and about more than 50% of waking hours)  3 - Symptomatic, >50% in bed, but not bedbound (Capable of only limited self-care, confined to bed or chair 50% or more of waking hours)  4 - Bedbound (Completely disabled. Cannot carry on any self-care. Totally confined to bed or chair)  5 - Death   Eustace Pen MM, Creech RH, Tormey DC, et al. [phone]). "Toxicity and response criteria of the [facility]". [place] Oncol. 5 (6): 649-55    LABORATORY  DATA:  Lab Results  Component Value Date   WBC 11.7 (H) 08/05/2020   HGB 9.7 (L) 08/05/2020   HCT 30.6 (L) 08/05/2020   MCV 86.1 08/05/2020   PLT 313.0 08/05/2020   Lab Results  Component Value Date   NA 137 08/05/2020   K 5.0 08/05/2020   CL 100 08/05/2020   CO2 31 08/05/2020   Lab Results  Component Value Date   ALT 9 08/05/2020   AST 12 08/05/2020   ALKPHOS 90 08/05/2020   BILITOT 0.3 08/05/2020      RADIOGRAPHY: MM Breast Surgical Specimen  Result Date: 09/19/2020 CLINICAL DATA:  Localization prior to surgery EXAM: SPECIMEN RADIOGRAPH OF THE RIGHT BREAST COMPARISON:  Previous exam(s). FINDINGS: Status post excision of the right breast. The radioactive seed and biopsy marker clip are present, completely intact, and were marked for  pathology. IMPRESSION: Specimen radiograph of the right breast. Electronically Signed   By: Dorise Bullion III M.D   On: 09/19/2020 09:35   MM RT RADIOACTIVE SEED LOC MAMMO GUIDE  Result Date: 09/18/2020 CLINICAL DATA:  Localization prior to surgery EXAM: MAMMOGRAPHIC GUIDED RADIOACTIVE SEED LOCALIZATION OF THE RIGHT BREAST COMPARISON:  Previous exam(s). FINDINGS: Patient presents for radioactive seed localization prior to surgery. I met with the patient and we discussed the procedure of seed localization including benefits and alternatives. We discussed the high likelihood of a successful procedure. We discussed the risks of the procedure including infection, bleeding, tissue injury and further surgery. We discussed the low dose of radioactivity involved in the procedure. Informed, written consent was given. The usual time-out protocol was performed immediately prior to the procedure. Using mammographic guidance, sterile technique, 1% lidocaine and an I-125 radioactive seed, the region of previous biopsy was localized using a lateral approach. The follow-up mammogram images confirm the seed in the expected location and were marked for the surgeon.  Follow-up survey of the patient confirms presence of the radioactive seed. Order number of I-125 seed:  [id number]. Total activity:  1.282 millicurie reference Date: August 20, 2020 The patient tolerated the procedure well and was released from the [facility]. She was given instructions regarding seed removal. IMPRESSION: Radioactive seed localization right breast. No apparent complications. Electronically Signed   By: Dorise Bullion III M.D   On: 09/18/2020 13:54       IMPRESSION/PLAN: 1. High Grade, ER/PR positive DCIS of the right breast. Dr. Lisbeth Renshaw discusses the final pathology findings and reviews the nature of right breast disease. The consensus from the breast conference includes breast conservation with lumpectomy. We reviewed the benefits of external radiotherapy to the breast  to reduce risks of local recurrence followed by antiestrogen therapy. We discussed the risks, benefits, short, and long term effects of radiotherapy, as well as the curative intent, and the patient is interested in proceeding. Dr. Lisbeth Renshaw discusses the delivery and logistics of radiotherapy and recommends  4 weeks of radiotherapy to the right breast. She will come in tomorrow for simulation at which time she will sign written consent to proceed with radiation.   Given current concerns for patient exposure during the COVID-19 pandemic, this encounter was conducted via telephone.  The patient has provided two factor identification and has given verbal consent for this type of encounter and has been advised to only accept a meeting of this type in a secure network environment. The time spent during this encounter was 45 minutes including preparation, discussion, and coordination of the patient's care. The attendants for this meeting include  Dr. Lisbeth Renshaw, Hayden Pedro  and [facility].  During the encounter, Dr. Lisbeth Renshaw, was located at [facility] Radiation Oncology Department. Shona Simpson was  located remotely at home. Doreene Forrey [place] was located at home.      Carola Rhine, [facility]    **Disclaimer: This note was dictated with voice recognition software. Similar sounding words can inadvertently be transcribed and this note may contain transcription errors which may not have been corrected upon publication of note.**

## 2020-10-15 ENCOUNTER — Ambulatory Visit: Payer: Medicare Other | Admitting: Radiation Oncology

## 2020-10-15 ENCOUNTER — Other Ambulatory Visit: Payer: Self-pay

## 2020-10-15 ENCOUNTER — Inpatient Hospital Stay: Payer: Medicare Other | Admitting: Nurse Practitioner

## 2020-10-15 ENCOUNTER — Ambulatory Visit
Admission: RE | Admit: 2020-10-15 | Discharge: 2020-10-15 | Disposition: A | Discharge status: Home / Self Care | Payer: Medicare Other | Source: Ambulatory Visit | Attending: Radiation Oncology | Admitting: Radiation Oncology

## 2020-10-15 DIAGNOSIS — Z170 Estrogen receptor positive status [ER+]: Secondary | ICD-10-CM | POA: Diagnosis not present

## 2020-10-15 DIAGNOSIS — Z510 Encounter for antineoplastic radiation therapy: Secondary | ICD-10-CM | POA: Diagnosis not present

## 2020-10-15 DIAGNOSIS — C50411 Malignant neoplasm of upper-outer quadrant of right female breast: Secondary | ICD-10-CM | POA: Diagnosis not present

## 2020-10-21 NOTE — Progress Notes (Deleted)
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 Marlou Starks  HISTORY OF PRESENTING ILLNESS:  Penny Gibson 75 y.o. female with PMH of HTN, DM, HL, OSA, and IDA is here because of recently diagnosed right breast DCIS.  Routine screening mammogram on 08/04/2020 showed breast density category B and right breast calcifications in the upper outer quadrant.  Diagnostic right mammo on 08/08/2020 showed a 5 mm group of calcifications with indeterminate morphology.  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  She underwent right breast lumpectomy with seed localization by Dr. Marlou Starks on 09/19/2020, path showed intermediate grade DCIS with calcifications spanning 1.5 cm with clear margins, this was staged pTis.  The patient was seen by Dr. Lisbeth Renshaw and is planning to undergo a 4-week course of adjuvant radiation beginning today.  She was referred to [place] to discuss adjuvant antiestrogen therapy and surveillance follow-up.  GYN HISTORY  Menarchal: xx LMP: xx Contraceptive: HRT:  GP:  DEXA was normal, showing lowest T score -0.8 at the AP spine on 03/16/2019  MEDICAL HISTORY:  Past Medical History:  Diagnosis Date  . Anemia   . Arthritis   . Asthma   . Constipation   . Diabetes mellitus without complication ([place])   . Dyspnea   . Hyperlipidemia   . Hypertension   . Insomnia   . Joint pain   . Knee pain   . Lactose intolerance   . Lower extremity edema   . Multiple food allergies   . Osteoarthritis   . Sinus problem     SURGICAL HISTORY: Past  Surgical History:  Procedure Laterality Date  . BREAST BIOPSY Left 2010  . BREAST LUMPECTOMY Left 2010  . BREAST LUMPECTOMY WITH RADIOACTIVE SEED LOCALIZATION Right 09/19/2020   Procedure: RIGHT BREAST LUMPECTOMY WITH RADIOACTIVE SEED LOCALIZATION;  Surgeon: [name], MD;  Location: [facility];  Service: General;  Laterality: Right;  . EYE SURGERY Bilateral    cataract surgery  . 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 TOTAL KNEE ARTHROPLASTY Left 03/10/2020   Procedure: LEFT TOTAL KNEE ARTHROPLASTY;  Surgeon: Leandrew Koyanagi, MD;  Location: [facility];  Service: Orthopedics;  Laterality: Left;  . TUBAL LIGATION      SOCIAL HISTORY: Social History   Socioeconomic History  . Marital status: Divorced    Spouse name: Not on file  . Number of children: Not on file  . Years of education: Not on file  . Highest education level: Not on file  Occupational History  . Not on file  Tobacco Use  . Smoking status: Never Smoker  . Smokeless tobacco: Never Used  Vaping Use  . Vaping Use: Never used  Substance and Sexual Activity  . Alcohol use: No  . Drug use: No  . Sexual activity: Not Currently    Birth control/protection: Surgical  Other Topics Concern  . Not on file  Social History Narrative  . Not on file   Social Determinants of Health   Financial Resource Strain: Not on file  Food Insecurity: Not on file  Transportation Needs: Not on file  Physical Activity: Not on file  Stress: Not on file  Social Connections: Not on file  Intimate Partner Violence: Not At Risk  . Fear of Current or Ex-Partner: No  . Emotionally Abused: No  . Physically Abused: No  . Sexually Abused: No    FAMILY HISTORY: Family History  Problem Relation Age of Onset  . Heart attack Mother   . Heart disease Mother   . Sudden death Mother   . Obesity Mother   . Cancer Sister   . Breast cancer Sister 45  . Stroke Maternal Grandmother   . Sudden death Father      ALLERGIES:  is allergic to feraheme [ferumoxytol], penicillins, and citric acid.  MEDICATIONS:  Current Outpatient Medications  Medication Sig Dispense Refill  . albuterol (PROAIR HFA) 108 (90 Base) MCG/ACT inhaler Inhale 1-2 puffs into the lungs every 6 (six) hours as needed for wheezing or shortness of breath. 6.7 g 0  . aspirin EC 81 MG tablet Take 1 tablet (81 mg total) by mouth 2 (two) times daily. 84 tablet 0  . atorvastatin (LIPITOR) 20 MG tablet Take 1 tablet (20 mg total) by mouth at bedtime. (Patient not taking: Reported on 10/06/2020) 90 tablet 3  . cetirizine (ZYRTEC) 10 MG tablet Take 10 mg by mouth daily.    Marland Kitchen HYDROcodone-acetaminophen (NORCO/VICODIN) 5-325 MG tablet Take 1-2 tablets by mouth every 6 (six) hours as needed for moderate pain or severe pain. (Patient not taking: Reported on 10/06/2020) 15 tablet 0  . lisinopril (ZESTRIL) 40 MG tablet Take 1 tablet (40 mg total) by mouth daily. 90 tablet 3   No current facility-administered medications for this visit.    REVIEW OF SYSTEMS:   Constitutional: Denies fevers, chills or abnormal night sweats Eyes: Denies blurriness of vision, double vision or watery eyes Ears, nose, mouth, throat, and face: Denies mucositis or sore throat Respiratory: Denies cough, dyspnea or wheezes Cardiovascular: Denies palpitation, chest discomfort or lower extremity swelling Gastrointestinal:  Denies nausea, heartburn or change in bowel habits Skin: Denies abnormal skin rashes Lymphatics: Denies new lymphadenopathy or easy bruising Neurological:Denies numbness, tingling or new weaknesses Behavioral/Psych: Mood is stable, no new changes  All other systems were reviewed with the patient and are negative.  PHYSICAL EXAMINATION: ECOG PERFORMANCE STATUS: {CHL ONC ECOG PS:[id number]}  There were no vitals filed for this visit. There were no vitals filed for this visit.  GENERAL:alert, no distress and comfortable SKIN: skin color, texture,  turgor are normal, no rashes or significant lesions EYES: normal, conjunctiva are pink and non-injected, sclera clear OROPHARYNX:no exudate, no erythema and lips, buccal mucosa, and tongue normal  NECK: supple, thyroid normal size, non-tender, without nodularity LYMPH:  no palpable lymphadenopathy in the cervical, axillary or inguinal LUNGS: clear to auscultation and percussion with normal breathing effort HEART: regular rate & rhythm and no murmurs and no lower extremity edema ABDOMEN:abdomen soft, non-tender and normal bowel sounds Musculoskeletal:no cyanosis of digits and no clubbing  PSYCH: alert & oriented x 3 with fluent speech NEURO: no focal motor/sensory deficits  LABORATORY DATA:  I have reviewed the data as listed CBC Latest Ref Rng & Units 08/05/2020 03/14/2020 03/07/2020  WBC 4.0 - 10.5 K/uL 11.7(H) 12.9(H) 11.1(H)  Hemoglobin 12.0 - 15.0 g/dL 9.7(L) 8.7(L) 11.1(L)  Hematocrit 36.0 - 46.0 % 30.6(L) 28.9(L) 36.1  Platelets 150.0 - 400.0 K/uL 313.0 256 203    @cmpl @  RADIOGRAPHIC STUDIES: I have personally reviewed the radiological images as listed and agreed with the findings  in the report. No results found.  ASSESSMENT & PLAN:  *** No orders of the defined types were placed in this encounter.   All questions were answered. The patient knows to call the clinic with any problems, questions or concerns. I spent {CHL ONC TIME VISIT - EOFHQ:[id number]} counseling the patient face to face. The total time spent in the appointment was {CHL ONC TIME VISIT - DIYME:[id number]} and more than 50% was on counseling.     Alla Feeling, NP 10/21/2020 1:38 PM

## 2020-10-22 ENCOUNTER — Ambulatory Visit: Payer: Medicare Other | Admitting: Radiation Oncology

## 2020-10-22 ENCOUNTER — Telehealth: Payer: Self-pay

## 2020-10-22 ENCOUNTER — Inpatient Hospital Stay: Payer: Medicare Other | Admitting: Nurse Practitioner

## 2020-10-22 NOTE — Telephone Encounter (Signed)
Spoke with patient regarding today's scheduled appointment. Patient was unaware of today's appointment and believed it had been canceled. Clarified patient's upcoming visits and answered additional questions.  Cira Rue, NP aware.

## 2020-10-23 ENCOUNTER — Encounter: Payer: Self-pay | Admitting: *Deleted

## 2020-10-23 ENCOUNTER — Ambulatory Visit: Payer: Medicare Other

## 2020-10-23 ENCOUNTER — Telehealth: Payer: Self-pay | Admitting: Nurse Practitioner

## 2020-10-23 DIAGNOSIS — Z510 Encounter for antineoplastic radiation therapy: Secondary | ICD-10-CM | POA: Insufficient documentation

## 2020-10-23 DIAGNOSIS — Z170 Estrogen receptor positive status [ER+]: Secondary | ICD-10-CM | POA: Insufficient documentation

## 2020-10-23 DIAGNOSIS — C50411 Malignant neoplasm of upper-outer quadrant of right female breast: Secondary | ICD-10-CM | POA: Insufficient documentation

## 2020-10-23 NOTE — Telephone Encounter (Signed)
Scheduled appt per 5/5 sch msg. Called pt, no answer. Left msg with appts date and times.

## 2020-10-24 ENCOUNTER — Ambulatory Visit: Payer: Medicare Other

## 2020-10-27 ENCOUNTER — Ambulatory Visit
Admission: RE | Admit: 2020-10-27 | Discharge: 2020-10-27 | Disposition: A | Discharge status: Home / Self Care | Payer: Medicare Other | Source: Ambulatory Visit | Attending: Radiation Oncology | Admitting: Radiation Oncology

## 2020-10-27 ENCOUNTER — Other Ambulatory Visit: Payer: Self-pay

## 2020-10-27 DIAGNOSIS — Z510 Encounter for antineoplastic radiation therapy: Secondary | ICD-10-CM | POA: Diagnosis not present

## 2020-10-27 DIAGNOSIS — Z170 Estrogen receptor positive status [ER+]: Secondary | ICD-10-CM | POA: Diagnosis not present

## 2020-10-27 DIAGNOSIS — C50411 Malignant neoplasm of upper-outer quadrant of right female breast: Secondary | ICD-10-CM | POA: Diagnosis not present

## 2020-10-28 ENCOUNTER — Ambulatory Visit
Admission: RE | Admit: 2020-10-28 | Discharge: 2020-10-28 | Disposition: A | Discharge status: Home / Self Care | Payer: Medicare Other | Source: Ambulatory Visit | Attending: Radiation Oncology | Admitting: Radiation Oncology

## 2020-10-28 DIAGNOSIS — C50411 Malignant neoplasm of upper-outer quadrant of right female breast: Secondary | ICD-10-CM | POA: Diagnosis not present

## 2020-10-28 DIAGNOSIS — Z510 Encounter for antineoplastic radiation therapy: Secondary | ICD-10-CM | POA: Diagnosis not present

## 2020-10-28 DIAGNOSIS — Z170 Estrogen receptor positive status [ER+]: Secondary | ICD-10-CM | POA: Diagnosis not present

## 2020-10-29 ENCOUNTER — Ambulatory Visit
Admission: RE | Admit: 2020-10-29 | Discharge: 2020-10-29 | Disposition: A | Discharge status: Home / Self Care | Payer: Medicare Other | Source: Ambulatory Visit | Attending: Radiation Oncology | Admitting: Radiation Oncology

## 2020-10-29 ENCOUNTER — Other Ambulatory Visit: Payer: Self-pay

## 2020-10-29 DIAGNOSIS — Z510 Encounter for antineoplastic radiation therapy: Secondary | ICD-10-CM | POA: Diagnosis not present

## 2020-10-29 DIAGNOSIS — C50411 Malignant neoplasm of upper-outer quadrant of right female breast: Secondary | ICD-10-CM | POA: Diagnosis not present

## 2020-10-29 DIAGNOSIS — Z170 Estrogen receptor positive status [ER+]: Secondary | ICD-10-CM | POA: Diagnosis not present

## 2020-10-30 ENCOUNTER — Ambulatory Visit
Admission: RE | Admit: 2020-10-30 | Discharge: 2020-10-30 | Disposition: A | Discharge status: Home / Self Care | Payer: Medicare Other | Source: Ambulatory Visit | Attending: Radiation Oncology | Admitting: Radiation Oncology

## 2020-10-30 DIAGNOSIS — C50411 Malignant neoplasm of upper-outer quadrant of right female breast: Secondary | ICD-10-CM | POA: Diagnosis not present

## 2020-10-30 DIAGNOSIS — Z170 Estrogen receptor positive status [ER+]: Secondary | ICD-10-CM | POA: Diagnosis not present

## 2020-10-30 DIAGNOSIS — Z510 Encounter for antineoplastic radiation therapy: Secondary | ICD-10-CM | POA: Diagnosis not present

## 2020-10-30 NOTE — Progress Notes (Signed)
Pt here for patient teaching.  Pt given Radiation and You booklet, skin care instructions, Alra deodorant and Radiaplex gel.  Reviewed areas of pertinence such as fatigue, hair loss, skin changes, breast tenderness and breast swelling . Pt able to give teach back of to pat skin and use unscented/gentle soap,apply Radiaplex bid, avoid applying anything to skin within 4 hours of treatment, avoid wearing an under wire bra and to use an electric razor if they must shave. Pt verbalizes understanding of information given and will contact nursing with any questions or concerns.     Http://rtanswers.org/treatmentinformation/whattoexpect/index  Yader Criger M. Waylin Dorko RN, BSN    

## 2020-10-31 ENCOUNTER — Ambulatory Visit
Admission: RE | Admit: 2020-10-31 | Discharge: 2020-10-31 | Disposition: A | Discharge status: Home / Self Care | Payer: Medicare Other | Source: Ambulatory Visit | Attending: Radiation Oncology | Admitting: Radiation Oncology

## 2020-10-31 ENCOUNTER — Other Ambulatory Visit: Payer: Self-pay

## 2020-10-31 DIAGNOSIS — C50411 Malignant neoplasm of upper-outer quadrant of right female breast: Secondary | ICD-10-CM

## 2020-10-31 DIAGNOSIS — Z170 Estrogen receptor positive status [ER+]: Secondary | ICD-10-CM | POA: Diagnosis not present

## 2020-10-31 DIAGNOSIS — Z510 Encounter for antineoplastic radiation therapy: Secondary | ICD-10-CM | POA: Diagnosis not present

## 2020-10-31 MED ORDER — RADIAPLEXRX EX GEL: Freq: Once | CUTANEOUS | Status: AC

## 2020-10-31 MED ORDER — ALRA NON-METALLIC DEODORANT (RAD-ONC)
1.0000 "application " | Freq: Once | TOPICAL | Status: AC
  Administered 2020-10-31: 1 via TOPICAL

## 2020-11-03 ENCOUNTER — Ambulatory Visit
Admission: RE | Admit: 2020-11-03 | Discharge: 2020-11-03 | Disposition: A | Discharge status: Home / Self Care | Payer: Medicare Other | Source: Ambulatory Visit | Attending: Radiation Oncology | Admitting: Radiation Oncology

## 2020-11-03 DIAGNOSIS — Z510 Encounter for antineoplastic radiation therapy: Secondary | ICD-10-CM | POA: Diagnosis not present

## 2020-11-03 DIAGNOSIS — C50411 Malignant neoplasm of upper-outer quadrant of right female breast: Secondary | ICD-10-CM | POA: Diagnosis not present

## 2020-11-03 DIAGNOSIS — Z170 Estrogen receptor positive status [ER+]: Secondary | ICD-10-CM | POA: Diagnosis not present

## 2020-11-04 ENCOUNTER — Ambulatory Visit
Admission: RE | Admit: 2020-11-04 | Discharge: 2020-11-04 | Disposition: A | Discharge status: Home / Self Care | Payer: Medicare Other | Source: Ambulatory Visit | Attending: Radiation Oncology | Admitting: Radiation Oncology

## 2020-11-04 DIAGNOSIS — C50411 Malignant neoplasm of upper-outer quadrant of right female breast: Secondary | ICD-10-CM | POA: Diagnosis not present

## 2020-11-04 DIAGNOSIS — Z510 Encounter for antineoplastic radiation therapy: Secondary | ICD-10-CM | POA: Diagnosis not present

## 2020-11-04 DIAGNOSIS — Z170 Estrogen receptor positive status [ER+]: Secondary | ICD-10-CM | POA: Diagnosis not present

## 2020-11-05 ENCOUNTER — Ambulatory Visit
Admission: RE | Admit: 2020-11-05 | Discharge: 2020-11-05 | Disposition: A | Discharge status: Home / Self Care | Payer: Medicare Other | Source: Ambulatory Visit | Attending: Radiation Oncology | Admitting: Radiation Oncology

## 2020-11-05 ENCOUNTER — Other Ambulatory Visit: Payer: Self-pay

## 2020-11-05 DIAGNOSIS — Z510 Encounter for antineoplastic radiation therapy: Secondary | ICD-10-CM | POA: Diagnosis not present

## 2020-11-05 DIAGNOSIS — Z170 Estrogen receptor positive status [ER+]: Secondary | ICD-10-CM | POA: Diagnosis not present

## 2020-11-05 DIAGNOSIS — C50411 Malignant neoplasm of upper-outer quadrant of right female breast: Secondary | ICD-10-CM | POA: Diagnosis not present

## 2020-11-06 ENCOUNTER — Ambulatory Visit
Admission: RE | Admit: 2020-11-06 | Discharge: 2020-11-06 | Disposition: A | Discharge status: Home / Self Care | Payer: Medicare Other | Source: Ambulatory Visit | Attending: Radiation Oncology | Admitting: Radiation Oncology

## 2020-11-06 ENCOUNTER — Ambulatory Visit: Payer: Medicare Other | Admitting: Radiation Oncology

## 2020-11-06 DIAGNOSIS — C50411 Malignant neoplasm of upper-outer quadrant of right female breast: Secondary | ICD-10-CM | POA: Diagnosis not present

## 2020-11-06 DIAGNOSIS — Z510 Encounter for antineoplastic radiation therapy: Secondary | ICD-10-CM | POA: Diagnosis not present

## 2020-11-06 DIAGNOSIS — Z170 Estrogen receptor positive status [ER+]: Secondary | ICD-10-CM | POA: Diagnosis not present

## 2020-11-07 ENCOUNTER — Ambulatory Visit
Admission: RE | Admit: 2020-11-07 | Discharge: 2020-11-07 | Disposition: A | Discharge status: Home / Self Care | Payer: Medicare Other | Source: Ambulatory Visit | Attending: Radiation Oncology | Admitting: Radiation Oncology

## 2020-11-07 ENCOUNTER — Other Ambulatory Visit: Payer: Self-pay

## 2020-11-07 ENCOUNTER — Ambulatory Visit: Payer: Medicare Other | Admitting: Radiation Oncology

## 2020-11-07 DIAGNOSIS — Z170 Estrogen receptor positive status [ER+]: Secondary | ICD-10-CM | POA: Diagnosis not present

## 2020-11-07 DIAGNOSIS — Z510 Encounter for antineoplastic radiation therapy: Secondary | ICD-10-CM | POA: Diagnosis not present

## 2020-11-07 DIAGNOSIS — C50411 Malignant neoplasm of upper-outer quadrant of right female breast: Secondary | ICD-10-CM | POA: Diagnosis not present

## 2020-11-10 ENCOUNTER — Ambulatory Visit
Admission: RE | Admit: 2020-11-10 | Discharge: 2020-11-10 | Disposition: A | Discharge status: Home / Self Care | Payer: Medicare Other | Source: Ambulatory Visit | Attending: Radiation Oncology | Admitting: Radiation Oncology

## 2020-11-10 ENCOUNTER — Other Ambulatory Visit: Payer: Self-pay

## 2020-11-10 DIAGNOSIS — Z170 Estrogen receptor positive status [ER+]: Secondary | ICD-10-CM | POA: Diagnosis not present

## 2020-11-10 DIAGNOSIS — Z510 Encounter for antineoplastic radiation therapy: Secondary | ICD-10-CM | POA: Diagnosis not present

## 2020-11-10 DIAGNOSIS — C50411 Malignant neoplasm of upper-outer quadrant of right female breast: Secondary | ICD-10-CM | POA: Diagnosis not present

## 2020-11-11 ENCOUNTER — Ambulatory Visit
Admission: RE | Admit: 2020-11-11 | Discharge: 2020-11-11 | Disposition: A | Discharge status: Home / Self Care | Payer: Medicare Other | Source: Ambulatory Visit | Attending: Radiation Oncology | Admitting: Radiation Oncology

## 2020-11-11 DIAGNOSIS — Z510 Encounter for antineoplastic radiation therapy: Secondary | ICD-10-CM | POA: Diagnosis not present

## 2020-11-11 DIAGNOSIS — C50411 Malignant neoplasm of upper-outer quadrant of right female breast: Secondary | ICD-10-CM | POA: Diagnosis not present

## 2020-11-11 DIAGNOSIS — Z170 Estrogen receptor positive status [ER+]: Secondary | ICD-10-CM | POA: Diagnosis not present

## 2020-11-12 ENCOUNTER — Ambulatory Visit
Admission: RE | Admit: 2020-11-12 | Discharge: 2020-11-12 | Disposition: A | Discharge status: Home / Self Care | Payer: Medicare Other | Source: Ambulatory Visit | Attending: Radiation Oncology | Admitting: Radiation Oncology

## 2020-11-12 ENCOUNTER — Other Ambulatory Visit: Payer: Self-pay

## 2020-11-12 DIAGNOSIS — C50411 Malignant neoplasm of upper-outer quadrant of right female breast: Secondary | ICD-10-CM | POA: Diagnosis not present

## 2020-11-12 DIAGNOSIS — Z510 Encounter for antineoplastic radiation therapy: Secondary | ICD-10-CM | POA: Diagnosis not present

## 2020-11-12 DIAGNOSIS — Z170 Estrogen receptor positive status [ER+]: Secondary | ICD-10-CM | POA: Diagnosis not present

## 2020-11-13 ENCOUNTER — Ambulatory Visit
Admission: RE | Admit: 2020-11-13 | Discharge: 2020-11-13 | Disposition: A | Discharge status: Home / Self Care | Payer: Medicare Other | Source: Ambulatory Visit | Attending: Radiation Oncology | Admitting: Radiation Oncology

## 2020-11-13 DIAGNOSIS — C50411 Malignant neoplasm of upper-outer quadrant of right female breast: Secondary | ICD-10-CM | POA: Diagnosis not present

## 2020-11-13 DIAGNOSIS — Z170 Estrogen receptor positive status [ER+]: Secondary | ICD-10-CM | POA: Diagnosis not present

## 2020-11-13 DIAGNOSIS — Z510 Encounter for antineoplastic radiation therapy: Secondary | ICD-10-CM | POA: Diagnosis not present

## 2020-11-14 ENCOUNTER — Ambulatory Visit: Payer: Medicare Other | Admitting: Radiation Oncology

## 2020-11-14 ENCOUNTER — Ambulatory Visit: Payer: Medicare Other

## 2020-11-16 DIAGNOSIS — Z170 Estrogen receptor positive status [ER+]: Secondary | ICD-10-CM | POA: Diagnosis not present

## 2020-11-16 DIAGNOSIS — Z510 Encounter for antineoplastic radiation therapy: Secondary | ICD-10-CM | POA: Diagnosis not present

## 2020-11-16 DIAGNOSIS — C50411 Malignant neoplasm of upper-outer quadrant of right female breast: Secondary | ICD-10-CM | POA: Diagnosis not present

## 2020-11-18 ENCOUNTER — Ambulatory Visit
Admission: RE | Admit: 2020-11-18 | Discharge: 2020-11-18 | Disposition: A | Discharge status: Home / Self Care | Payer: Medicare Other | Source: Ambulatory Visit | Attending: Radiation Oncology | Admitting: Radiation Oncology

## 2020-11-18 ENCOUNTER — Other Ambulatory Visit: Payer: Self-pay

## 2020-11-18 DIAGNOSIS — Z510 Encounter for antineoplastic radiation therapy: Secondary | ICD-10-CM | POA: Diagnosis not present

## 2020-11-18 DIAGNOSIS — C50411 Malignant neoplasm of upper-outer quadrant of right female breast: Secondary | ICD-10-CM | POA: Diagnosis not present

## 2020-11-18 DIAGNOSIS — Z170 Estrogen receptor positive status [ER+]: Secondary | ICD-10-CM | POA: Diagnosis not present

## 2020-11-19 ENCOUNTER — Ambulatory Visit
Admission: RE | Admit: 2020-11-19 | Discharge: 2020-11-19 | Disposition: A | Discharge status: Home / Self Care | Payer: Medicare Other | Source: Ambulatory Visit | Attending: Radiation Oncology | Admitting: Radiation Oncology

## 2020-11-19 ENCOUNTER — Ambulatory Visit: Payer: Medicare Other

## 2020-11-19 DIAGNOSIS — Z510 Encounter for antineoplastic radiation therapy: Secondary | ICD-10-CM | POA: Diagnosis not present

## 2020-11-19 DIAGNOSIS — C50411 Malignant neoplasm of upper-outer quadrant of right female breast: Secondary | ICD-10-CM | POA: Insufficient documentation

## 2020-11-19 DIAGNOSIS — Z170 Estrogen receptor positive status [ER+]: Secondary | ICD-10-CM | POA: Insufficient documentation

## 2020-11-20 ENCOUNTER — Inpatient Hospital Stay: Payer: Medicare Other

## 2020-11-20 ENCOUNTER — Encounter: Payer: Self-pay | Admitting: Nurse Practitioner

## 2020-11-20 ENCOUNTER — Ambulatory Visit
Admission: RE | Admit: 2020-11-20 | Discharge: 2020-11-20 | Disposition: A | Discharge status: Home / Self Care | Payer: Medicare Other | Source: Ambulatory Visit | Attending: Radiation Oncology | Admitting: Radiation Oncology

## 2020-11-20 ENCOUNTER — Other Ambulatory Visit: Payer: Self-pay

## 2020-11-20 ENCOUNTER — Inpatient Hospital Stay: Payer: Medicare Other | Attending: Nurse Practitioner | Admitting: Nurse Practitioner

## 2020-11-20 ENCOUNTER — Other Ambulatory Visit: Payer: Self-pay | Admitting: Nurse Practitioner

## 2020-11-20 VITALS — BP 168/93 | HR 77 | Temp 97.6°F | Resp 19 | Ht 61.0 in | Wt 216.8 lb

## 2020-11-20 DIAGNOSIS — Z7982 Long term (current) use of aspirin: Secondary | ICD-10-CM | POA: Insufficient documentation

## 2020-11-20 DIAGNOSIS — Z170 Estrogen receptor positive status [ER+]: Secondary | ICD-10-CM | POA: Diagnosis not present

## 2020-11-20 DIAGNOSIS — C50411 Malignant neoplasm of upper-outer quadrant of right female breast: Secondary | ICD-10-CM | POA: Insufficient documentation

## 2020-11-20 DIAGNOSIS — E119 Type 2 diabetes mellitus without complications: Secondary | ICD-10-CM | POA: Diagnosis not present

## 2020-11-20 DIAGNOSIS — I10 Essential (primary) hypertension: Secondary | ICD-10-CM | POA: Insufficient documentation

## 2020-11-20 DIAGNOSIS — J45909 Unspecified asthma, uncomplicated: Secondary | ICD-10-CM | POA: Diagnosis not present

## 2020-11-20 DIAGNOSIS — Z923 Personal history of irradiation: Secondary | ICD-10-CM | POA: Insufficient documentation

## 2020-11-20 DIAGNOSIS — E785 Hyperlipidemia, unspecified: Secondary | ICD-10-CM | POA: Diagnosis not present

## 2020-11-20 DIAGNOSIS — Z803 Family history of malignant neoplasm of breast: Secondary | ICD-10-CM | POA: Diagnosis not present

## 2020-11-20 DIAGNOSIS — Z8249 Family history of ischemic heart disease and other diseases of the circulatory system: Secondary | ICD-10-CM | POA: Insufficient documentation

## 2020-11-20 DIAGNOSIS — Z8349 Family history of other endocrine, nutritional and metabolic diseases: Secondary | ICD-10-CM | POA: Insufficient documentation

## 2020-11-20 DIAGNOSIS — Z79899 Other long term (current) drug therapy: Secondary | ICD-10-CM | POA: Insufficient documentation

## 2020-11-20 DIAGNOSIS — Z510 Encounter for antineoplastic radiation therapy: Secondary | ICD-10-CM | POA: Diagnosis not present

## 2020-11-20 NOTE — Progress Notes (Cosign Needed Addendum)
[place]  Telephone:(336) [phone] Fax:(336) [place] Note   Patient Care Team: [name], [name], NP as PCP - General (Internal Medicine) Mauro Kaufmann, RN as Oncology Nurse Navigator Rockwell Germany, RN as Oncology Nurse Navigator 11/20/2020  CHIEF COMPLAINTS/PURPOSE OF CONSULTATION:  Breast cancer, referred by [facility] surgeon Dr. Autumn Messing  SUMMARY OF ONCOLOGY HISTORY  Oncology History  Malignant neoplasm of upper-outer quadrant of right breast in female, estrogen receptor positive ([place])  08/04/2020 Mammogram   Screening bilateral mammogram FINDINGS: In the right breast, calcifications warrant further evaluation with magnified views. These calcifications are seen within the upper outer quadrant at posterior depth. In the left breast, no findings suspicious for malignancy. The images were evaluated with computer-aided detection. IMPRESSION: Further evaluation is suggested for calcifications in the right breast.   08/08/2020 Mammogram   Diagnostic unilateral right mammogram FINDINGS: There is a 5 mm group of pleomorphic calcifications in the slightly upper outer right breast at posterior depth. These have an indeterminate morphology.   IMPRESSION: Indeterminate 5 mm group of calcifications in the upper-outer right breast. Recommendation is for stereotactic biopsy.   08/18/2020 Cancer Staging   Staging form: Breast, AJCC 8th Edition - Clinical stage from 08/18/2020: Stage 0 (cTis (DCIS), cN0, cM0, ER+, PR+) - Signed by Gardenia Phlegm, NP on 08/27/2020 Stage prefix: Initial diagnosis Cancer Staging Malignant neoplasm of upper-outer quadrant of right breast in female, estrogen receptor positive ([place]) Staging form: Breast, AJCC 8th Edition - Clinical stage from 08/18/2020: Stage 0 (cTis (DCIS), cN0, cM0, ER+, PR+) - Signed by Gardenia Phlegm, NP on 08/27/2020 Stage prefix: Initial diagnosis - Pathologic stage from 09/19/2020:  Stage Unknown (pTis (DCIS), pNX, cM0, ER+, PR+, HER2: Not Assessed) - Signed by Alla Feeling, NP on 10/15/2020 Nuclear grade: G2     08/18/2020 Initial Biopsy   Diagnosis Breast, right, needle core biopsy, UOQ - DUCTAL CARCINOMA IN SITU WITH CALCIFICATIONS AND NECROSIS, SEE COMMENT. - SCLEROSING ADENOSIS WITH CALCIFICATIONS. Microscopic Comment The carcinoma appears high grade. PROGNOSTIC INDICATORS Results: IMMUNOHISTOCHEMICAL AND MORPHOMETRIC ANALYSIS PERFORMED MANUALLY Estrogen Receptor: 95%, POSITIVE, STRONG STAINING INTENSITY Progesterone Receptor: 95%, POSITIVE, STRONG STAINING INTENSITY   08/27/2020 Initial Diagnosis   Malignant neoplasm of upper-outer quadrant of right breast in female, estrogen receptor positive ([place])   09/19/2020 Definitive Surgery   Right breast lumpectomy with radioactive seed localization by Dr. Autumn Messing   09/19/2020 Pathology Results   FINAL MICROSCOPIC DIAGNOSIS:   A. BREAST, RIGHT, LUMPECTOMY:  -  Ductal carcinoma in situ with calcifications, intermediate grade, 1.5  cm  -  Margins uninvolved by carcinoma (0.3; anterior margin)  -  Previous biopsy site changes  -  Adenosis with calcifications  -  See oncology table and comment below   B. BREAST, RIGHT ADDITIONAL DEEP MARGIN, EXCISION:  -  No residual ductal carcinoma in situ identified  Procedure: Excision  Specimen Laterality: Right  Histologic Type: Ductal carcinoma in situ  Size of DCIS: 1.5 cm (gross measurement)  Nuclear Grade: G2, intermediate  Necrosis: Not identified  Margins: All margins negative for DCIS       Specify Closest Margin (required only if <35m): 3, anterior  Regional Lymph Nodes: Not applicable (no lymph nodes submitted or found)  Breast Biomarker Testing Performed on Previous Biopsy:       Testing performed on Case Number: [id number]       Estrogen Receptor: POSITIVE, 95% STRONG       Progesterone Receptor:  POSITIVE, 95% STRONG  Pathologic Stage Classification  (pTNM, AJCC 8th Edition): pTis, f not  assigned    09/19/2020 Cancer Staging   Staging form: Breast, AJCC 8th Edition - Pathologic stage from 09/19/2020: Stage Unknown (pTis (DCIS), pNX, cM0, ER+, PR+, HER2: Not Assessed) - Signed by Alla Feeling, NP on 10/15/2020 Nuclear grade: G2   10/27/2020 -  Radiation Therapy   Adjuvant radiation per Dr. Lisbeth Renshaw    HISTORY OF PRESENTING ILLNESS:  Penny Gibson 75 y.o. female known to our clinic for iron deficiency anemia last seen by Dr. Lebron Conners in 09/2017 is here because of recently diagnosed right breast ductal carcinoma in situ.  She underwent screening bilateral mammogram on 07/31/2020 which showed breast density category B and calcifications in the upper outer quadrant of right breast.  Left breast was benign.  Diagnostic right mammogram on 08/08/2020 showed indeterminate 5 mm group of calcifications in the upper outer right breast.  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  She underwent right breast lumpectomy with seed localization by Dr. Marlou Starks on 09/19/2020.  Pathology showed intermediate grade DCIS with calcifications spanning 1.5 cm, margins clear, ER/PR 95% strongly positive.  This was staged pTis, lymph nodes not examined.  She began adjuvant radiation by Dr. Lisbeth Renshaw on 10/27/2020, plan to complete on 11/27/2020.  She was referred to [place] to discuss antiestrogen therapy and surveillance.  GYN HISTORY  Menarchal: 51 LMP: Age 76, natural menopause HRT: None G2P2  Past medical history includes iron deficiency anemia, hypertension, diet-controlled DM, hyperlipidemia, osteoarthritis in her knees. Last bone density 03/16/2019 was normal.   Socially, she lives alone, independent with ADLs. alcohol/tobacco/drug use.  Previous Pap smears have been normal, not up-to-date on colonoscopy but has a GI provider. Family history  significant for a sister who died of breast cancer at age 22, unknown if she had genetic testing, and a cousin with colon cancer.  Today, she presents by herself.  She feels well in general.  Tolerating radiation without significant skin toxicities.  She has mild pain in her knees, managed with Tylenol.  Manages activities okay.  Appetite is normal.  Denies fever, chills, cough, chest pain, dyspnea, changes in bowel habits, bleeding, signs of thrombosis, or other concerns.  MEDICAL HISTORY:  Past Medical History:  Diagnosis Date  . Anemia   . Arthritis   . Asthma   . Constipation   . Diabetes mellitus without complication ([place])   . Dyspnea   . Hyperlipidemia   . Hypertension   . Insomnia   . Joint pain   . Knee pain   . Lactose intolerance   . Lower extremity edema   . Multiple food allergies   . Osteoarthritis   . Sinus problem     SURGICAL HISTORY: Past Surgical History:  Procedure Laterality Date  . BREAST BIOPSY Left 2010  . BREAST LUMPECTOMY Left 2010  . BREAST LUMPECTOMY WITH RADIOACTIVE SEED LOCALIZATION Right 09/19/2020   Procedure: RIGHT BREAST LUMPECTOMY WITH RADIOACTIVE SEED LOCALIZATION;  Surgeon: [name], MD;  Location: [facility];  Service: General;  Laterality: Right;  . EYE SURGERY Bilateral    cataract surgery  . 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 TOTAL KNEE ARTHROPLASTY Left 03/10/2020   Procedure: LEFT TOTAL KNEE ARTHROPLASTY;  Surgeon: Leandrew Koyanagi, MD;  Location: [facility];  Service: Orthopedics;  Laterality: Left;  .  TUBAL LIGATION      SOCIAL HISTORY: Social History   Socioeconomic History  . Marital status: Divorced    Spouse name: Not on file  . Number of children: Not on file  . Years of education: Not on file  . Highest education level: Not on file  Occupational History  . Not on file  Tobacco Use  . Smoking status: Never Smoker  . Smokeless tobacco: Never Used  Vaping Use  . Vaping Use: Never  used  Substance and Sexual Activity  . Alcohol use: No  . Drug use: No  . Sexual activity: Not Currently    Birth control/protection: Surgical  Other Topics Concern  . Not on file  Social History Narrative  . Not on file   Social Determinants of Health   Financial Resource Strain: Not on file  Food Insecurity: Not on file  Transportation Needs: Not on file  Physical Activity: Not on file  Stress: Not on file  Social Connections: Not on file  Intimate Partner Violence: Not At Risk  . Fear of Current or Ex-Partner: No  . Emotionally Abused: No  . Physically Abused: No  . Sexually Abused: No    FAMILY HISTORY: Family History  Problem Relation Age of Onset  . Heart attack Mother   . Heart disease Mother   . Sudden death Mother   . Obesity Mother   . Cancer Sister   . Breast cancer Sister 58  . Stroke Maternal Grandmother   . Sudden death Father     ALLERGIES:  is allergic to feraheme [ferumoxytol], penicillins, and citric acid.  MEDICATIONS:  Current Outpatient Medications  Medication Sig Dispense Refill  . albuterol (PROAIR HFA) 108 (90 Base) MCG/ACT inhaler Inhale 1-2 puffs into the lungs every 6 (six) hours as needed for wheezing or shortness of breath. 6.7 g 0  . aspirin EC 81 MG tablet Take 1 tablet (81 mg total) by mouth 2 (two) times daily. 84 tablet 0  . atorvastatin (LIPITOR) 20 MG tablet Take 1 tablet (20 mg total) by mouth at bedtime. 90 tablet 3  . cetirizine (ZYRTEC) 10 MG tablet Take 10 mg by mouth daily.    Marland Kitchen HYDROcodone-acetaminophen (NORCO/VICODIN) 5-325 MG tablet Take 1-2 tablets by mouth every 6 (six) hours as needed for moderate pain or severe pain. 15 tablet 0  . lisinopril (ZESTRIL) 40 MG tablet Take 1 tablet (40 mg total) by mouth daily. 90 tablet 3   No current facility-administered medications for this visit.    REVIEW OF SYSTEMS:   Constitutional: Denies fevers, chills or abnormal night sweats Eyes: Denies blurriness of vision, double  vision or watery eyes Ears, nose, mouth, throat, and face: Denies mucositis or sore throat Respiratory: Denies cough, dyspnea or wheezes Cardiovascular: Denies palpitation, chest discomfort or lower extremity swelling Gastrointestinal:  Denies nausea, heartburn or change in bowel habits Skin: Denies abnormal skin rashes Lymphatics: Denies new lymphadenopathy or easy bruising Neurological:Denies numbness, tingling or new weaknesses Behavioral/Psych: Mood is stable, no new changes  All other systems were reviewed with the patient and are negative.  PHYSICAL EXAMINATION: ECOG PERFORMANCE STATUS: 0 - Asymptomatic  Vitals:   11/20/20 1055  BP: (!) 168/93  Pulse: 77  Resp: 19  Temp: 97.6 F (36.4 C)  SpO2: 98%   Filed Weights   11/20/20 1055  Weight: 216 lb 12.8 oz (98.3 kg)    GENERAL:alert, no distress and comfortable SKIN: no rash  EYES:  sclera clear LUNGS:  normal breathing  effort HEART:  no lower extremity edema PSYCH: alert & oriented x 3 with fluent speech NEURO: no focal motor/sensory deficits Breast exam: inspection shows mild erythema and dry skin of the right breast. Incision completely healed. No nipple discharge or inversion  LABORATORY DATA:  I have reviewed the data as listed CBC Latest Ref Rng & Units 08/05/2020 03/14/2020 03/07/2020  WBC 4.0 - 10.5 K/uL 11.7(H) 12.9(H) 11.1(H)  Hemoglobin 12.0 - 15.0 g/dL 9.7(L) 8.7(L) 11.1(L)  Hematocrit 36.0 - 46.0 % 30.6(L) 28.9(L) 36.1  Platelets 150.0 - 400.0 K/uL 313.0 256 203   CMP Latest Ref Rng & Units 08/05/2020 03/14/2020 03/07/2020  Glucose 70 - 99 mg/dL 95 93 106(H)  BUN 6 - 23 mg/dL 17 63(H) 36(H)  Creatinine 0.40 - 1.20 mg/dL 1.03 3.02(H) 1.39(H)  Sodium 135 - 145 mEq/L 137 132(L) 135  Potassium 3.5 - 5.1 mEq/L 5.0 4.4 5.1  Chloride 96 - 112 mEq/L 100 102 102  CO2 19 - 32 mEq/L 31 15(L) 21(L)  Calcium 8.4 - 10.5 mg/dL 9.7 9.1 10.2  Total Protein 6.0 - 8.3 g/dL 8.4(H) 7.6 8.6(H)  Total Bilirubin 0.2 - 1.2  mg/dL 0.3 0.6 0.3  Alkaline Phos 39 - 117 U/L 90 62 77  AST 0 - 37 U/L 12 16 16   ALT 0 - 35 U/L 9 14 13     RADIOGRAPHIC STUDIES: I have personally reviewed the radiological images as listed and agreed with the findings in the report. No results found.  ASSESSMENT & PLAN: 75 year old postmenopausal female  1.  Ductal carcinoma in situ with calcifications, intermediate grade, ER/PR positive, stage 0 -I reviewed her medical record in detail.  Initial biopsy showed high-grade DCIS with calcifications and necrosis -She underwent lumpectomy by Dr. Marlou Starks on 09/19/2020, path showed intermediate grade DCIS, clear margins, ER/PR positive.  I reviewed her preinvasive DCIS was removed and surgery, any adjuvant treatment is to reduce the risk of future recurrence -She is currently undergoing adjuvant radiation to reduce the risk of local recurrence by Dr. Lisbeth Renshaw, started 10/27/2020. She is tolerating well. Plan to complete 11/27/2020 -Given the strong ER and PR positivity, I do recommend adjuvant anti-estrogen therapy with tamoxifen or aromatase inhibitor to reduce her risk of cancer recurrence, the potential benefit and side effects of AI, which include but are not limited to hot flash, skin and vaginal dryness, metabolic changes ( increased blood glucose, cholesterol, weight, etc.), slightly in increased risk of cardiovascular disease, cataracts, muscular and joint discomfort, osteopenia and osteoporosis, etc, were discussed with her in great details. She is not very interested interested -Tamoxifen: I discussed the unique potential side effects of tamoxifen including vaginal discharge, improved BMD, and risk for endometrial carcinoma -Given that she has baseline OA and knee pain, normal bone density, no previous hysterectomy or abnormal vaginal exam, and no prior DVT/PE, she would be a good candidate for either tamoxifen or exemestane.  -She declined anti-estrogen therapy due to potential side effects.  -I  discussed the surveillance plan which includes annual mammogram, self breast exams, and lab/physical exam with [place]. Given she declined adjuvant anti-estrogen therapy, she would likely benefit from additional screening breast MRI q1-2 years, scheduled 6 months apart from mammograms.  -Ms. Bergren is tolerating RT well, she will complete 11/27/20.  -She will see Dr. Marlou Starks in approx 6 weeks -She agrees to [facility] visit in 3 months, will schedule.  2. Co-morbidities: OA, HTN, HL, DM -she is not on DM medication, controlled with diet -she has mild pain in  knees, s/p left knee replacement but needs right knee done -able to manage ADLs and activities well -continue f/up per PCP  3. Genetics  -sister had breast cancer at age 15, unknown if genetics were done, and a cousin with colon cancer  -patient previously had normal PAPs, she believes she is overdue for colonoscopy  -Due to family history, she likely qualifies for genetic testing. She has 2 adult children. will discuss option for genetic testing at next visit   PLAN: -Imaging and pathology reviewed -Continue adjuvant RT (Dr. Lisbeth Renshaw), complete 11/27/20 -Patient declined adjuvant anti-estrogen therapy  -[facility] visit in 3 months and breast cancer surveillance   All questions were answered. The patient knows to call the clinic with any problems, questions or concerns. Total time spent in today's encounter was 45 minutes, with over 50% reviewing records, counseling the patient, and coordinating care.      Alla Feeling, NP 11/20/2020

## 2020-11-21 ENCOUNTER — Ambulatory Visit: Payer: Medicare Other

## 2020-11-21 ENCOUNTER — Telehealth: Payer: Self-pay | Admitting: Hematology

## 2020-11-21 NOTE — Telephone Encounter (Signed)
Left message with follow-up appointments per 6/2 los. Gave option to call back to reschedule if needed.

## 2020-11-24 ENCOUNTER — Ambulatory Visit: Payer: Medicare Other

## 2020-11-24 ENCOUNTER — Encounter: Payer: Self-pay | Admitting: *Deleted

## 2020-11-25 ENCOUNTER — Encounter: Payer: Self-pay | Admitting: Hematology and Oncology

## 2020-11-25 ENCOUNTER — Other Ambulatory Visit: Payer: Self-pay

## 2020-11-25 ENCOUNTER — Ambulatory Visit: Payer: Medicare Other

## 2020-11-25 ENCOUNTER — Ambulatory Visit
Admission: RE | Admit: 2020-11-25 | Discharge: 2020-11-25 | Disposition: A | Discharge status: Home / Self Care | Payer: Medicare Other | Source: Ambulatory Visit | Attending: Radiation Oncology | Admitting: Radiation Oncology

## 2020-11-25 DIAGNOSIS — Z170 Estrogen receptor positive status [ER+]: Secondary | ICD-10-CM | POA: Diagnosis not present

## 2020-11-25 DIAGNOSIS — C50411 Malignant neoplasm of upper-outer quadrant of right female breast: Secondary | ICD-10-CM | POA: Diagnosis not present

## 2020-11-25 DIAGNOSIS — Z510 Encounter for antineoplastic radiation therapy: Secondary | ICD-10-CM | POA: Diagnosis not present

## 2020-11-26 ENCOUNTER — Encounter: Payer: Self-pay | Admitting: *Deleted

## 2020-11-26 ENCOUNTER — Ambulatory Visit
Admission: RE | Admit: 2020-11-26 | Discharge: 2020-11-26 | Disposition: A | Discharge status: Home / Self Care | Payer: Medicare Other | Source: Ambulatory Visit | Attending: Radiation Oncology | Admitting: Radiation Oncology

## 2020-11-26 DIAGNOSIS — C50411 Malignant neoplasm of upper-outer quadrant of right female breast: Secondary | ICD-10-CM | POA: Diagnosis not present

## 2020-11-26 DIAGNOSIS — Z170 Estrogen receptor positive status [ER+]: Secondary | ICD-10-CM | POA: Diagnosis not present

## 2020-11-26 DIAGNOSIS — Z510 Encounter for antineoplastic radiation therapy: Secondary | ICD-10-CM | POA: Diagnosis not present

## 2020-11-27 ENCOUNTER — Encounter: Payer: Self-pay | Admitting: Radiation Oncology

## 2020-11-27 ENCOUNTER — Ambulatory Visit
Admission: RE | Admit: 2020-11-27 | Discharge: 2020-11-27 | Disposition: A | Discharge status: Home / Self Care | Payer: Medicare Other | Source: Ambulatory Visit | Attending: Radiation Oncology | Admitting: Radiation Oncology

## 2020-11-27 ENCOUNTER — Other Ambulatory Visit: Payer: Self-pay

## 2020-11-27 DIAGNOSIS — C50411 Malignant neoplasm of upper-outer quadrant of right female breast: Secondary | ICD-10-CM | POA: Diagnosis not present

## 2020-11-27 DIAGNOSIS — Z170 Estrogen receptor positive status [ER+]: Secondary | ICD-10-CM | POA: Diagnosis not present

## 2020-11-27 DIAGNOSIS — Z510 Encounter for antineoplastic radiation therapy: Secondary | ICD-10-CM | POA: Diagnosis not present

## 2020-11-28 ENCOUNTER — Encounter: Payer: Self-pay | Admitting: Hematology and Oncology

## 2020-11-28 NOTE — Progress Notes (Signed)
                                                                                                                                                             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 She developed anticipated skin changes in the treatment field as well as anticipated fatigue.  Plan: The patient will receive a call in about one month from the radiation oncology department. She will continue follow up with Dr. Burr Medico as well.   ________________________________________________    Carola Rhine, [facility]

## 2021-01-12 ENCOUNTER — Other Ambulatory Visit: Payer: Self-pay

## 2021-01-12 ENCOUNTER — Ambulatory Visit
Admission: RE | Admit: 2021-01-12 | Discharge: 2021-01-12 | Disposition: A | Discharge status: Home / Self Care | Payer: Medicare Other | Source: Ambulatory Visit | Attending: Radiation Oncology | Admitting: Radiation Oncology

## 2021-01-12 DIAGNOSIS — C50411 Malignant neoplasm of upper-outer quadrant of right female breast: Secondary | ICD-10-CM | POA: Insufficient documentation

## 2021-01-12 DIAGNOSIS — Z170 Estrogen receptor positive status [ER+]: Secondary | ICD-10-CM | POA: Insufficient documentation

## 2021-01-12 NOTE — Progress Notes (Signed)
  Radiation Oncology         [phone] ________________________________  Name: BELANNA VESTAL MRN: [id number]  Date of Service: 01/12/2021  DOB: 02-Dec-1945  Post Treatment Telephone Note  Diagnosis:    High Grade, ER/PR positive DCIS of the right breast  Interval Since Last Radiation:  7 weeks   10/27/2020 through 11/27/2020 Site Technique Total Dose (Gy) Dose per Fx (Gy) Completed Fx Beam Energies  Breast, Right: Breast_Rt 3D 42.56/42.56 2.66 16/16 10X  Breast, Right: Breast_Rt_Bst 3D 8/8 2 4/4 6X, 10X    Narrative:  The patient was contacted today for routine follow-up. During treatment she did very well with radiotherapy and did not have significant desquamation.   Impression/Plan: 1.  High Grade, ER/PR positive DCIS of the right breast. I was unable to reach the patient but left her a voicemail and on the message I discussed that we would be happy to continue to follow her as needed, but she will also continue to follow up with Dr. Burr Medico in medical oncology. She was counseled on skin care as well as measures to avoid sun exposure to this area.         Carola Rhine, PAC

## 2021-01-13 NOTE — Addendum Note (Signed)
Encounter addended by: Hayden Pedro, PA-C on: 01/13/2021 10:55 AM  Actions taken: Clinical Note Signed

## 2021-01-13 NOTE — Progress Notes (Signed)
I called the patient back to answer some questions. She reports her skin is healing up nicely. She will follow up with medical oncology even though she is not planning on antiestrogen treatment.

## 2021-01-26 ENCOUNTER — Encounter: Payer: Self-pay | Admitting: Nurse Practitioner

## 2021-01-28 ENCOUNTER — Other Ambulatory Visit: Payer: Self-pay | Admitting: Adult Health

## 2021-01-28 DIAGNOSIS — Z170 Estrogen receptor positive status [ER+]: Secondary | ICD-10-CM

## 2021-01-28 DIAGNOSIS — C50411 Malignant neoplasm of upper-outer quadrant of right female breast: Secondary | ICD-10-CM

## 2021-02-24 ENCOUNTER — Inpatient Hospital Stay: Payer: Medicare Other | Admitting: Nurse Practitioner

## 2021-02-26 ENCOUNTER — Other Ambulatory Visit: Payer: Self-pay | Admitting: Nurse Practitioner

## 2021-02-26 DIAGNOSIS — J4520 Mild intermittent asthma, uncomplicated: Secondary | ICD-10-CM

## 2021-02-27 ENCOUNTER — Telehealth: Payer: Self-pay | Admitting: Nurse Practitioner

## 2021-02-27 ENCOUNTER — Other Ambulatory Visit: Payer: Self-pay | Admitting: Nurse Practitioner

## 2021-02-27 DIAGNOSIS — E1169 Type 2 diabetes mellitus with other specified complication: Secondary | ICD-10-CM

## 2021-02-27 DIAGNOSIS — I10 Essential (primary) hypertension: Secondary | ICD-10-CM

## 2021-02-27 DIAGNOSIS — M159 Polyosteoarthritis, unspecified: Secondary | ICD-10-CM

## 2021-02-27 DIAGNOSIS — J4520 Mild intermittent asthma, uncomplicated: Secondary | ICD-10-CM

## 2021-02-27 DIAGNOSIS — Z96652 Presence of left artificial knee joint: Secondary | ICD-10-CM

## 2021-02-27 NOTE — Telephone Encounter (Signed)
Duplicate request

## 2021-02-27 NOTE — Telephone Encounter (Signed)
Chart supports refill Last ov 07/18/2020 Last refill 07/18/2020

## 2021-03-01 ENCOUNTER — Telehealth: Payer: Self-pay

## 2021-03-01 NOTE — Telephone Encounter (Signed)
Unable to reach pt unable to leave voicemail.

## 2021-03-02 ENCOUNTER — Telehealth: Payer: Self-pay

## 2021-03-02 NOTE — Telephone Encounter (Signed)
   Telephone encounter was:  Successful.  03/02/2021 Name: Penny Gibson MRN: [id number] DOB: March 29, 1946  Penny Gibson is a 75 y.o. year old female who is a primary care patient of [name], [name], NP . The community resource team was consulted for assistance with Transportation Needs   Care guide performed the following interventions:  Called pt regarding [facility]. Pt advised she started off great with [facility] then [facility] started picking her up late, etc. I advised I would look for another Vendor and pt advised she is already set up with [employer].  Follow Up Plan:  No further follow up planned at this time. The patient has been provided with needed resources. Ben from Transportation advised Holiday representative enrolled/scheduled patient per [employer] request.   [place], [place]  Main Phone: [phone]  E-mail: [email or web address]  Website: [email or web address]

## 2021-03-02 NOTE — Telephone Encounter (Signed)
Pt is returning her call and would like a call back.

## 2021-03-06 ENCOUNTER — Ambulatory Visit: Payer: Medicare Other

## 2021-03-06 NOTE — Progress Notes (Signed)
Subjective:   Penny Gibson is a 75 y.o. female who presents for Medicare Annual (Subsequent) preventive examination.  I connected with  Penny Gibson on 03/06/21 by audio enabled telemedicine application and verified that I am speaking with the correct person using two identifiers.full name and DOB.  Location of patient:  home Location of provider:  office Persons participating in visit:   Penny Gibson, and Penny Gibson, CMA   I discussed the limitations of evaluation and management by telemedicine. The patient expressed understanding and agreed to proceed.   Review of Systems    Deferred to PCP Cardiac Risk Factors include: advanced age (>68mn, >>56women)     Objective:    There were no vitals filed for this visit. There is no height or weight on file to calculate BMI.  Advanced Directives 03/06/2021 11/20/2020 10/06/2020 09/19/2020 09/11/2020 09/09/2020 03/26/2020  Does Patient Have a Medical Advance Directive? No No No No No No No  Would patient like information on creating a medical advance directive? No - Patient declined No - Patient declined - No - Patient declined No - Patient declined No - Patient declined -  Pre-existing out of facility DNR order (yellow form or pink MOST form) - - - - - - -    Current Medications (verified) Outpatient Encounter Medications as of 03/06/2021  Medication Sig   acetaminophen (TYLENOL) 500 MG tablet Take 500 mg by mouth every 6 (six) hours as needed.   albuterol (VENTOLIN HFA) 108 (90 Base) MCG/ACT inhaler INHALE 1 TO 2 PUFFS INTO THE LUNGS EVERY 6 HOURS AS NEEDED FOR WHEEZING OR SHORTNESS OF BREATH   aspirin EC 81 MG tablet Take 1 tablet (81 mg total) by mouth 2 (two) times daily.   atorvastatin (LIPITOR) 20 MG tablet Take 1 tablet (20 mg total) by mouth at bedtime.   cetirizine (ZYRTEC) 10 MG tablet Take 10 mg by mouth daily.   diphenhydramine-acetaminophen (TYLENOL PM) 25-500 MG TABS tablet Take 1 tablet by mouth at bedtime as  needed.   fexofenadine (ALLEGRA) 180 MG tablet Take 180 mg by mouth daily.   lisinopril (ZESTRIL) 40 MG tablet Take 1 tablet (40 mg total) by mouth daily.   [DISCONTINUED] HYDROcodone-acetaminophen (NORCO/VICODIN) 5-325 MG tablet Take 1-2 tablets by mouth every 6 (six) hours as needed for moderate pain or severe pain. (Patient not taking: Reported on 03/06/2021)   No facility-administered encounter medications on file as of 03/06/2021.    Allergies (verified) Feraheme [ferumoxytol], Penicillins, and Citric acid   History: Past Medical History:  Diagnosis Date   Anemia    Arthritis    Asthma    Constipation    Diabetes mellitus without complication (HCC)    Dyspnea    Hyperlipidemia    Hypertension    Insomnia    Joint pain    Knee pain    Lactose intolerance    Lower extremity edema    Multiple food allergies    Osteoarthritis    Sinus problem    Past Surgical History:  Procedure Laterality Date   BREAST BIOPSY Left 2010   BREAST LUMPECTOMY Left 2010   BREAST LUMPECTOMY WITH RADIOACTIVE SEED LOCALIZATION Right 09/19/2020   Procedure: RIGHT BREAST LUMPECTOMY WITH RADIOACTIVE SEED LOCALIZATION;  Surgeon: TJovita Kussmaul MD;  Location[place]  Service: General;  Laterality: Right;   BREAST SURGERY Right 09/19/2020   EYE SURGERY Bilateral    cataract surgery   REPLACEMENT TOTAL KNEE  03/10/2020  TOTAL KNEE ARTHROPLASTY Left 03/10/2020   Procedure: LEFT TOTAL KNEE ARTHROPLASTY;  Surgeon: Leandrew Koyanagi, MD;  Location: [place];  Service: Orthopedics;  Laterality: Left;   TUBAL LIGATION     Family History  Problem Relation Age of Onset   Heart attack Mother    Heart disease Mother    Sudden death Mother    Obesity Mother    Cancer Sister    Breast cancer Sister 80   Stroke Maternal Grandmother    Sudden death Father    Social History   Socioeconomic History   Marital status: Divorced    Spouse name: Not on file   Number of  children: 2   Years of education: Not on file   Highest education level: Not on file  Occupational History   Not on file  Tobacco Use   Smoking status: Never   Smokeless tobacco: Never  Vaping Use   Vaping Use: Never used  Substance and Sexual Activity   Alcohol use: No   Drug use: No   Sexual activity: Not Currently    Birth control/protection: Surgical  Other Topics Concern   Not on file  Social History Narrative   Not on file   Social Determinants of Health   Financial Resource Strain: Not on file  Food Insecurity: Not on file  Transportation Needs: Not on file  Physical Activity: Not on file  Stress: Not on file  Social Connections: Not on file    Tobacco Counseling Counseling given: Not Answered   Clinical Intake:  Pre-visit preparation completed: Yes  Pain : No/denies pain     Nutritional Risks: None Diabetes: No  How often do you need to have someone help you when you read instructions, pamphlets, or other written materials from your doctor or pharmacy?: 1 - Never  Diabetic?no  Interpreter Needed?: No  Information entered by :: Penny Gibson, [place]   Activities of Daily Living In your present state of health, do you have any difficulty performing the following activities: 03/06/2021 09/19/2020  Hearing? N N  Vision? N N  Difficulty concentrating or making decisions? Y N  Comment remembering things -  Walking or climbing stairs? Y Y  Comment knee replacement knee pain  Dressing or bathing? N N  Doing errands, shopping? N -  Preparing Food and eating ? N -  Using the Toilet? N -  In the past six months, have you accidently leaked urine? Y -  Comment when coughing -  Do you have problems with loss of bowel control? N -  Managing your Medications? N -  Managing your Finances? N -  Housekeeping or managing your Housekeeping? N -  Some recent data might be hidden    Patient Care Team: [name], [name], NP as PCP - General (Internal  Medicine) Mauro Kaufmann, RN as Oncology Nurse Navigator Rockwell Germany, RN as Oncology Nurse Navigator  Indicate any recent Medical Services you may have received from other than [facility] providers in the past year (date may be approximate).     Assessment:   This is a routine wellness examination for [name].  Hearing/Vision screen No results found.  Dietary issues and exercise activities discussed: Current Exercise Habits: Home exercise routine, Type of exercise: walking, Time (Minutes): 20, Frequency (Times/Week): 3, Weekly Exercise (Minutes/Week): 60, Intensity: Moderate, Exercise limited by: None identified   Goals Addressed   None    Depression Screen [facility] 2/9 Scores 03/06/2021 12/27/2019 07/11/2019 11/04/2017 06/07/2017 09/24/2016  07/26/2014  PHQ - 2 Score 1 6 0 0 0 0 0  PHQ- 9 Score 2 16 - - - - -    Fall Risk Fall Risk  03/06/2021 08/15/2019 07/11/2019 11/04/2017 06/07/2017  Falls in the past year? 0 0 0 No No  Comment - - - - -  Number falls in past yr: 0 0 - - -  Injury with Fall? 0 0 - - -  Risk for fall due to : No Fall Risks - - - -  Follow up Falls evaluation completed Education provided;Falls prevention discussed - - -    FALL RISK PREVENTION PERTAINING TO THE HOME:  Any stairs in or around the home? Yes  If so, are there any without handrails? Yes  Home free of loose throw rugs in walkways, pet beds, electrical cords, etc? yes Adequate lighting in your home to reduce risk of falls? Yes   ASSISTIVE DEVICES UTILIZED TO PREVENT FALLS:  Life alert? Yes  Use of a cane, walker or w/c? Yes, cane Grab bars in the bathroom? Yes  Shower chair or bench in shower? No  Elevated toilet seat or a handicapped toilet? yes  TIMED UP AND GO:  Was the test performed? N/A Length of time to ambulate 10 feet::  N/A    Cognitive Function: MMSE - Mini Mental State Exam 01/31/2014  Orientation to time 5  Orientation to Place 5  Registration 3  Attention/ Calculation 5  Recall 3   Language- name 2 objects 2  Language- repeat 1  Language- follow 3 step command 3  Language- read & follow direction 1  Write a sentence 1  Copy design 1  Total score 30     6CIT Screen 03/06/2021  What Year? 0 points  What month? 0 points  What time? 0 points  Count back from 20 0 points  Months in reverse 0 points  Repeat phrase 0 points  Total Score 0    Immunizations Immunization History  Administered Date(s) Administered   Influenza, High Dose Seasonal PF 03/19/2016, 04/12/2017, 04/21/2018   Influenza,inj,Quad PF,6+ Mos 07/26/2014   Influenza-Unspecified 03/21/2013, 04/21/2018, 04/01/2020   Moderna Sars-Covid-2 Vaccination 08/04/2019, 09/02/2019, 05/01/2020   Pneumococcal Conjugate-13 07/26/2014   Pneumococcal Polysaccharide-23 09/20/2013   Tdap 06/22/2013    TDAP status: Up to date  Flu Vaccine status: Due, Education has been provided regarding the importance of this vaccine. Advised may receive this vaccine at local pharmacy or Health Dept. Aware to provide a copy of the vaccination record if obtained from local pharmacy or Health Dept. Verbalized acceptance and understanding.  Pneumococcal vaccine status: Up to date  Covid-19 vaccine status: Completed vaccines  Qualifies for Shingles Vaccine? No   Zostavax completed No     Screening Tests Health Maintenance  Topic Date Due   FOOT EXAM  Never done   OPHTHALMOLOGY EXAM  Never done   Zoster Vaccines- Shingrix (1 of 2) Never done   Fecal DNA (Cologuard)  10/06/2019   COVID-19 Vaccine (4 - Booster for Moderna series) 07/24/2020   INFLUENZA VACCINE  01/19/2021   HEMOGLOBIN A1C  02/02/2021   MAMMOGRAM  08/08/2022   TETANUS/TDAP  06/23/2023   DEXA SCAN  Completed   Hepatitis C Screening  Completed   PNA vac Low Risk Adult  Completed   HPV VACCINES  Aged Out    Health Maintenance  Health Maintenance Due  Topic Date Due   FOOT EXAM  Never done   OPHTHALMOLOGY EXAM  Never done  Zoster Vaccines-  Shingrix (1 of 2) Never done   Fecal DNA (Cologuard)  10/06/2019   COVID-19 Vaccine (4 - Booster for Moderna series) 07/24/2020   INFLUENZA VACCINE  01/19/2021   HEMOGLOBIN A1C  02/02/2021    Colorectal cancer screening: Type of screening: Cologuard. Completed 2022. Repeat every 10 years  Mammogram status: Completed 08/08/2020. Repeat every year  Bone Density status: Completed 03/16/2019. Results reflect: Bone density results: OSTEOPOROSIS. Repeat every 3 years.  Lung Cancer Screening: (Low Dose CT Chest recommended if Age 74-80 years, 30 pack-year currently smoking OR have quit w/in 15years.) does not qualify.   Lung Cancer Screening Referral: N/A  Additional Screening:  Hepatitis C Screening: does not qualify  Vision Screening: Recommended annual ophthalmology exams for early detection of glaucoma and other disorders of the eye. Is the patient up to date with their annual eye exam?  No  Who is the provider or what is the name of the office in which the patient attends annual eye exams?  Dr Satira Sark, [facility] If pt is not established with a provider, would they like to be referred to a provider to establish care? No .   Dental Screening: Recommended annual dental exams for proper oral hygiene  Community Resource Referral / Chronic Care Management: CRR required this visit?  No   CCM required this visit?  No      Plan:     I have personally reviewed and noted the following in the patient's chart:   Medical and social history Use of alcohol, tobacco or illicit drugs  Current medications and supplements including opioid prescriptions.  Functional ability and status Nutritional status Physical activity Advanced directives List of other physicians Hospitalizations, surgeries, and ER visits in previous 12 months Vitals Screenings to include cognitive, depression, and falls Referrals and appointments  In addition, I have reviewed and discussed with patient certain  preventive protocols, quality metrics, and best practice recommendations. A written personalized care plan for preventive services as well as general preventive health recommendations were provided to patient.     Konrad Saha, [place]   03/06/2021   Nurse Notes:  none face to face  45 Mins encounter

## 2021-03-10 NOTE — Telephone Encounter (Signed)
Pt called wondering why her medication is not being refilled.

## 2021-03-11 NOTE — Telephone Encounter (Signed)
She mentioned all of them but also singled out Lisinopril and her inhaler.

## 2021-03-12 ENCOUNTER — Ambulatory Visit: Payer: Medicare Other | Admitting: Family Medicine

## 2021-03-13 NOTE — Telephone Encounter (Signed)
Called and spoke to pharmacy rep. Pharmacy recieved approved requests for Albuterol and lisinopril, but never refilled precriptions. Patient can now pick up refills.

## 2021-03-17 ENCOUNTER — Telehealth: Payer: Self-pay | Admitting: Adult Health

## 2021-03-17 NOTE — Telephone Encounter (Signed)
Called and [facility] for patient to return my call about her SCP appointment.  Her appointment In September was canceled, I asked that scheduling tentatively reschedule her for October.  Mychart message sent.    Wilber Bihari, NP

## 2021-03-26 ENCOUNTER — Other Ambulatory Visit: Payer: Self-pay

## 2021-03-26 ENCOUNTER — Telehealth: Payer: Self-pay | Admitting: Nurse Practitioner

## 2021-03-26 ENCOUNTER — Other Ambulatory Visit (INDEPENDENT_AMBULATORY_CARE_PROVIDER_SITE_OTHER): Payer: Medicare Other

## 2021-03-26 ENCOUNTER — Ambulatory Visit (INDEPENDENT_AMBULATORY_CARE_PROVIDER_SITE_OTHER): Payer: Medicare Other | Admitting: Nurse Practitioner

## 2021-03-26 ENCOUNTER — Encounter: Payer: Self-pay | Admitting: Nurse Practitioner

## 2021-03-26 ENCOUNTER — Other Ambulatory Visit: Payer: Self-pay | Admitting: Nurse Practitioner

## 2021-03-26 VITALS — BP 140/90 | HR 90 | Temp 97.1°F | Wt 221.0 lb

## 2021-03-26 DIAGNOSIS — D509 Iron deficiency anemia, unspecified: Secondary | ICD-10-CM

## 2021-03-26 DIAGNOSIS — E1169 Type 2 diabetes mellitus with other specified complication: Secondary | ICD-10-CM

## 2021-03-26 DIAGNOSIS — E790 Hyperuricemia without signs of inflammatory arthritis and tophaceous disease: Secondary | ICD-10-CM | POA: Insufficient documentation

## 2021-03-26 DIAGNOSIS — Q845 Enlarged and hypertrophic nails: Secondary | ICD-10-CM

## 2021-03-26 DIAGNOSIS — Z23 Encounter for immunization: Secondary | ICD-10-CM | POA: Diagnosis not present

## 2021-03-26 DIAGNOSIS — J302 Other seasonal allergic rhinitis: Secondary | ICD-10-CM

## 2021-03-26 DIAGNOSIS — I10 Essential (primary) hypertension: Secondary | ICD-10-CM

## 2021-03-26 DIAGNOSIS — E785 Hyperlipidemia, unspecified: Secondary | ICD-10-CM

## 2021-03-26 DIAGNOSIS — Z1211 Encounter for screening for malignant neoplasm of colon: Secondary | ICD-10-CM

## 2021-03-26 DIAGNOSIS — M109 Gout, unspecified: Secondary | ICD-10-CM | POA: Diagnosis not present

## 2021-03-26 LAB — BASIC METABOLIC PANEL
BUN: 22 mg/dL (ref 6–23)
CO2: 28 mEq/L (ref 19–32)
Calcium: 9.5 mg/dL (ref 8.4–10.5)
Chloride: 101 mEq/L (ref 96–112)
Creatinine, Ser: 1.1 mg/dL (ref 0.40–1.20)
GFR: 49.72 mL/min — ABNORMAL LOW (ref >60.00)
Glucose, Bld: 94 mg/dL (ref 70–99)
Potassium: 4.7 mEq/L (ref 3.5–5.1)
Sodium: 138 mEq/L (ref 135–145)

## 2021-03-26 LAB — LIPID PANEL
Cholesterol: 195 mg/dL (ref 0–200)
HDL: 44.9 mg/dL (ref >39.00)
LDL Cholesterol: 112 mg/dL — ABNORMAL HIGH (ref 0–99)
NonHDL: 150.29
Total CHOL/HDL Ratio: 4
Triglycerides: 189 mg/dL — ABNORMAL HIGH (ref 0.0–149.0)
VLDL: 37.8 mg/dL (ref 0.0–40.0)

## 2021-03-26 LAB — CBC WITH DIFFERENTIAL/PLATELET
Basophils Absolute: 0.1 10*3/uL (ref 0.0–0.1)
Basophils Relative: 1 % (ref 0.0–3.0)
Eosinophils Absolute: 0.1 10*3/uL (ref 0.0–0.7)
Eosinophils Relative: 1.6 % (ref 0.0–5.0)
HCT: 31 % — ABNORMAL LOW (ref 36.0–46.0)
Hemoglobin: 9.7 g/dL — ABNORMAL LOW (ref 12.0–15.0)
Lymphocytes Relative: 19.4 % (ref 12.0–46.0)
Lymphs Abs: 1.5 10*3/uL (ref 0.7–4.0)
MCHC: 31.3 g/dL (ref 30.0–36.0)
MCV: 83.6 fl (ref 78.0–100.0)
Monocytes Absolute: 0.7 10*3/uL (ref 0.1–1.0)
Monocytes Relative: 9 % (ref 3.0–12.0)
Neutro Abs: 5.2 10*3/uL (ref 1.4–7.7)
Neutrophils Relative %: 69 % (ref 43.0–77.0)
Platelets: 304 10*3/uL (ref 150.0–400.0)
RBC: 3.71 Mil/uL — ABNORMAL LOW (ref 3.87–5.11)
RDW: 17.6 % — ABNORMAL HIGH (ref 11.5–15.5)
WBC: 7.6 10*3/uL (ref 4.0–10.5)

## 2021-03-26 LAB — URIC ACID: Uric Acid, Serum: 8.8 mg/dL — ABNORMAL HIGH (ref 2.4–7.0)

## 2021-03-26 LAB — HEMOGLOBIN A1C: Hgb A1c MFr Bld: 6.5 % (ref 4.6–6.5)

## 2021-03-26 LAB — FERRITIN: Ferritin: 12.1 ng/mL (ref 10.0–291.0)

## 2021-03-26 MED ORDER — NAPROXEN 500 MG PO TABS: 500.0000 mg | ORAL_TABLET | Freq: Every day | ORAL | 0 refills | Status: DC | PRN

## 2021-03-26 MED ORDER — FLUTICASONE PROPIONATE 50 MCG/ACT NA SUSP: 2.0000 | Freq: Every day | NASAL | 0 refills | Status: DC

## 2021-03-26 MED ORDER — NAPROXEN 500 MG PO TABS: 500.0000 mg | ORAL_TABLET | Freq: Two times a day (BID) | ORAL | 0 refills | Status: DC | PRN

## 2021-03-26 MED ORDER — MONTELUKAST SODIUM 10 MG PO TABS: 10.0000 mg | ORAL_TABLET | Freq: Every day | ORAL | 5 refills | Status: DC

## 2021-03-26 NOTE — Progress Notes (Signed)
Subjective:  Patient ID: Penny Gibson, female    DOB: 11-09-1945  Age: 75 y.o. MRN: [id number]  CC: Acute Visit (Pt c/o right foot and toe pain x 3-4 days. Pt states she thinks it is gout, stating it comes and goes every few months. /Flu shot given today. )  Toe Pain  The incident occurred more than 1 week ago. There was no injury mechanism. Pain location: right great toe. The quality of the pain is described as aching. The pain is moderate. The pain has been Fluctuating since onset. Pertinent negatives include no inability to bear weight, loss of motion, loss of sensation, muscle weakness, numbness or tingling. She reports no foreign bodies present. The symptoms are aggravated by movement, palpation and weight bearing. She has tried nothing for the symptoms.   Essential hypertension Mild elevation today Does not monitor BP at home Reports she is compliant with lisinopril and low sodium diet BP Readings from Last 3 Encounters:  03/26/21 140/90  11/20/20 (!) 168/93  09/19/20 124/68   Repeat BMP. Continue current med dose.  Gouty arthritis of right great toe right great toe pain and swelling, waxing and waning Onset 84months ago. Denies any injury to foot or toe  Check uric acid and BMP: stable renal function, uric acid at 8.8 Use naproxen once a day prn due to mild decline in renal function. Maintain low purine and low sodium diet maintain adequate oral hydration Repeat labs in 42months  Iron deficiency anemia Repeat cbc: Stable cbc with persistent anemia (possible due to GI bleed vs kidney disease), ask lab to add ferritin level if possible. Denies any obvious GI bleed cologuard 2018: negative She declined referral to GI for colonoscopy in 2019 Ferritin 03/2021 at 12  Hx of feraheme anaphylaxis Order stool occult  Reviewed past Medical, Social and Family history today.  Outpatient Medications Prior to Visit  Medication Sig Dispense Refill   acetaminophen (TYLENOL)  500 MG tablet Take 500 mg by mouth every 6 (six) hours as needed.     albuterol (VENTOLIN HFA) 108 (90 Base) MCG/ACT inhaler INHALE 1 TO 2 PUFFS INTO THE LUNGS EVERY 6 HOURS AS NEEDED FOR WHEEZING OR SHORTNESS OF BREATH 6.7 g 0   aspirin EC 81 MG tablet Take 1 tablet (81 mg total) by mouth 2 (two) times daily. 84 tablet 0   atorvastatin (LIPITOR) 20 MG tablet Take 1 tablet (20 mg total) by mouth at bedtime. 90 tablet 3   diphenhydramine-acetaminophen (TYLENOL PM) 25-500 MG TABS tablet Take 1 tablet by mouth at bedtime as needed.     lisinopril (ZESTRIL) 40 MG tablet Take 1 tablet (40 mg total) by mouth daily. 90 tablet 3   cetirizine (ZYRTEC) 10 MG tablet Take 10 mg by mouth daily.     fexofenadine (ALLEGRA) 180 MG tablet Take 180 mg by mouth daily.     No facility-administered medications prior to visit.    ROS See HPI  Objective:  BP 140/90 (BP Location: Left Arm, Patient Position: Sitting, Cuff Size: Large)   Pulse 90   Temp (!) 97.1 F (36.2 C) (Temporal)   Wt 221 lb (100.2 kg)   SpO2 99%   BMI 41.76 kg/m   Physical Exam  Assessment & Plan:  This visit occurred during the SARS-CoV-2 public health emergency.  Safety protocols were in place, including screening questions prior to the visit, additional usage of staff PPE, and extensive cleaning of exam room while observing appropriate contact time as indicated for  disinfecting solutions.   Penny Gibson was seen today for acute visit.  Diagnoses and all orders for this visit:  Gouty arthritis of right great toe -     Uric acid -     Discontinue: naproxen (NAPROSYN) 500 MG tablet; Take 1 tablet (500 mg total) by mouth 2 (two) times daily as needed (with food). -     naproxen (NAPROSYN) 500 MG tablet; Take 1 tablet (500 mg total) by mouth daily as needed (with food).  Flu vaccine need -     Flu Vaccine QUAD High Dose(Fluad)  Essential hypertension -     Basic metabolic panel  Type 2 diabetes mellitus with other specified  complication, without long-term current use of insulin (HCC) -     Basic metabolic panel -     Hemoglobin A1c -     Ambulatory referral to Podiatry  Hyperlipidemia associated with type 2 diabetes mellitus (HCC) -     Lipid panel  Iron deficiency anemia, unspecified iron deficiency anemia type -     CBC with Differential/Platelet -     Ferritin -     Fecal occult blood, imunochemical; Standing  Other seasonal allergic rhinitis -     fluticasone (FLONASE) 50 MCG/ACT nasal spray; Place 2 sprays into both nostrils daily. -     montelukast (SINGULAIR) 10 MG tablet; Take 1 tablet (10 mg total) by mouth at bedtime.  Enlarged and hypertrophic nails -     Ambulatory referral to Podiatry  Other orders -     Cancel: Cologuard; Future  Stable hgbA1c at 6.5% Stable cbc with persistent anemia (possible due to GI bleed vs kidney disease), ask lab to add ferritin level if possible. Abnormal lipid panel: DASH diet is important stable renal function, uric acid at 8.8 Use naproxen once a day prn due to mild decline in renal function. Maintain low purine and low sodium diet maintain adequate oral hydration Repeat labs in 90months  Problem List Items Addressed This Visit       Cardiovascular and Mediastinum   Essential hypertension (Chronic)    Mild elevation today Does not monitor BP at home Reports she is compliant with lisinopril and low sodium diet BP Readings from Last 3 Encounters:  03/26/21 140/90  11/20/20 (!) 168/93  09/19/20 124/68   Repeat BMP. Continue current med dose.      Relevant Orders   Basic metabolic panel (Completed)     Respiratory   Other seasonal allergic rhinitis   Relevant Medications   fluticasone (FLONASE) 50 MCG/ACT nasal spray   montelukast (SINGULAIR) 10 MG tablet     Endocrine   Diabetes mellitus ([place])   Relevant Orders   Basic metabolic panel (Completed)   Hemoglobin A1c (Completed)   Ambulatory referral to Podiatry   Hyperlipidemia  associated with type 2 diabetes mellitus ([place])   Relevant Orders   Lipid panel (Completed)     Musculoskeletal and Integument   Gouty arthritis of right great toe - Primary    right great toe pain and swelling, waxing and waning Onset 31months ago. Denies any injury to foot or toe  Check uric acid and BMP: stable renal function, uric acid at 8.8 Use naproxen once a day prn due to mild decline in renal function. Maintain low purine and low sodium diet maintain adequate oral hydration Repeat labs in 47months      Relevant Medications   naproxen (NAPROSYN) 500 MG tablet   Other Relevant Orders   Uric acid (Completed)  Other   Iron deficiency anemia    Repeat cbc: Stable cbc with persistent anemia (possible due to GI bleed vs kidney disease), ask lab to add ferritin level if possible. Denies any obvious GI bleed cologuard 2018: negative She declined referral to GI for colonoscopy in 2019 Ferritin 03/2021 at 12  Hx of feraheme anaphylaxis Order stool occult      Relevant Orders   CBC with Differential/Platelet (Completed)   Ferritin   Fecal occult blood, imunochemical   Other Visit Diagnoses     Flu vaccine need       Relevant Orders   Flu Vaccine QUAD High Dose(Fluad) (Completed)   Enlarged and hypertrophic nails       Relevant Orders   Ambulatory referral to Podiatry       Follow-up: Return in about 3 months (around 06/26/2021) for DM and HTN, hyperlipidemia (fasting).  [name], NP

## 2021-03-26 NOTE — Assessment & Plan Note (Addendum)
Repeat cbc: Stable cbc with persistent anemia (possible due to GI bleed vs kidney disease), ask lab to add ferritin level if possible. Denies any obvious GI bleed cologuard 2018: negative She declined referral to GI for colonoscopy/EDG in 2019 Ferritin 03/2021 at 12  Hx of feraheme anaphylaxis Order stool occult

## 2021-03-26 NOTE — Patient Instructions (Addendum)
Go to lab for blood draw.  Use naproxen for toe pain as needed.  Use montelukast and flonase for chronic sinus congestion. Stop allegra and zyrtec if not beneficial.  You will be contacted about cologuard kit. Need to complete this for colon cancer screen.  You will be contacted to schedule appt with podiatry.  Schedule appt for annual diabetic eye exam.  Diabetes Mellitus and Foot Care Foot care is an important part of your health, especially when you have diabetes. Diabetes may cause you to have problems because of poor blood flow (circulation) to your feet and legs, which can cause your skin to: Become thinner and drier. Break more easily. Heal more slowly. Peel and crack. You may also have nerve damage (neuropathy) in your legs and feet, causing decreased feeling in them. This means that you may not notice minor injuries to your feet that could lead to more serious problems. Noticing and addressing any potential problems early is the best way to prevent future foot problems. How to care for your feet Foot hygiene  Wash your feet daily with warm water and mild soap. Do not use hot water. Then, pat your feet and the areas between your toes until they are completely dry. Do not soak your feet as this can dry your skin. Trim your toenails straight across. Do not dig under them or around the cuticle. File the edges of your nails with an emery board or nail file. Apply a moisturizing lotion or petroleum jelly to the skin on your feet and to dry, brittle toenails. Use lotion that does not contain alcohol and is unscented. Do not apply lotion between your toes. Shoes and socks Wear clean socks or stockings every day. Make sure they are not too tight. Do not wear knee-high stockings since they may decrease blood flow to your legs. Wear shoes that fit properly and have enough cushioning. Always look in your shoes before you put them on to be sure there are no objects inside. To break in new  shoes, wear them for just a few hours a day. This prevents injuries on your feet. Wounds, scrapes, corns, and calluses  Check your feet daily for blisters, cuts, bruises, sores, and redness. If you cannot see the bottom of your feet, use a mirror or ask someone for help. Do not cut corns or calluses or try to remove them with medicine. If you find a minor scrape, cut, or break in the skin on your feet, keep it and the skin around it clean and dry. You may clean these areas with mild soap and water. Do not clean the area with peroxide, alcohol, or iodine. If you have a wound, scrape, corn, or callus on your foot, look at it several times a day to make sure it is healing and not infected. Check for: Redness, swelling, or pain. Fluid or blood. Warmth. Pus or a bad smell. General tips Do not cross your legs. This may decrease blood flow to your feet. Do not use heating pads or hot water bottles on your feet. They may burn your skin. If you have lost feeling in your feet or legs, you may not know this is happening until it is too late. Protect your feet from hot and cold by wearing shoes, such as at the beach or on hot pavement. Schedule a complete foot exam at least once a year (annually) or more often if you have foot problems. Report any cuts, sores, or bruises to your health  care provider immediately. Where to find more information American Diabetes Association: www.diabetes.org Association of Diabetes Care & Education Specialists: www.diabeteseducator.org Contact a health care provider if: You have a medical condition that increases your risk of infection and you have any cuts, sores, or bruises on your feet. You have an injury that is not healing. You have redness on your legs or feet. You feel burning or tingling in your legs or feet. You have pain or cramps in your legs and feet. Your legs or feet are numb. Your feet always feel cold. You have pain around any toenails. Get help right  away if: You have a wound, scrape, corn, or callus on your foot and: You have pain, swelling, or redness that gets worse. You have fluid or blood coming from the wound, scrape, corn, or callus. Your wound, scrape, corn, or callus feels warm to the touch. You have pus or a bad smell coming from the wound, scrape, corn, or callus. You have a fever. You have a red line going up your leg. Summary Check your feet every day for blisters, cuts, bruises, sores, and redness. Apply a moisturizing lotion or petroleum jelly to the skin on your feet and to dry, brittle toenails. Wear shoes that fit properly and have enough cushioning. If you have foot problems, report any cuts, sores, or bruises to your health care provider immediately. Schedule a complete foot exam at least once a year (annually) or more often if you have foot problems. This information is not intended to replace advice given to you by your health care provider. Make sure you discuss any questions you have with your health care provider. Document Revised: 12/27/2019 Document Reviewed: 12/27/2019 Elsevier Patient Education  [place].

## 2021-03-26 NOTE — Assessment & Plan Note (Signed)
right great toe pain and swelling, waxing and waning Onset 41months ago. Denies any injury to foot or toe  Check uric acid and BMP: stable renal function, uric acid at 8.8 Use naproxen once a day prn due to mild decline in renal function. Maintain low purine and low sodium diet maintain adequate oral hydration Repeat labs in 78months

## 2021-03-26 NOTE — Assessment & Plan Note (Signed)
Mild elevation today Does not monitor BP at home Reports she is compliant with lisinopril and low sodium diet BP Readings from Last 3 Encounters:  03/26/21 140/90  11/20/20 (!) 168/93  09/19/20 124/68   Repeat BMP. Continue current med dose.

## 2021-03-27 NOTE — Telephone Encounter (Signed)
Discussed lab results with patient. 

## 2021-03-30 ENCOUNTER — Encounter: Payer: Self-pay | Admitting: Nurse Practitioner

## 2021-04-08 ENCOUNTER — Other Ambulatory Visit: Payer: Self-pay | Admitting: Nurse Practitioner

## 2021-04-08 DIAGNOSIS — J4520 Mild intermittent asthma, uncomplicated: Secondary | ICD-10-CM

## 2021-04-14 ENCOUNTER — Inpatient Hospital Stay: Payer: Medicare Other | Attending: Adult Health | Admitting: Adult Health

## 2021-04-15 ENCOUNTER — Other Ambulatory Visit: Payer: Self-pay

## 2021-04-15 DIAGNOSIS — J4520 Mild intermittent asthma, uncomplicated: Secondary | ICD-10-CM

## 2021-04-15 MED ORDER — ALBUTEROL SULFATE HFA 108 (90 BASE) MCG/ACT IN AERS: INHALATION_SPRAY | RESPIRATORY_TRACT | 0 refills | Status: DC

## 2021-04-15 NOTE — Telephone Encounter (Signed)
Refill requested by fax from pharmacy.  Chart supports Rx

## 2021-04-17 ENCOUNTER — Other Ambulatory Visit: Payer: Self-pay | Admitting: Nurse Practitioner

## 2021-04-17 DIAGNOSIS — J4520 Mild intermittent asthma, uncomplicated: Secondary | ICD-10-CM

## 2021-04-20 ENCOUNTER — Other Ambulatory Visit: Payer: Self-pay

## 2021-04-20 ENCOUNTER — Encounter: Payer: Self-pay | Admitting: Podiatry

## 2021-04-20 ENCOUNTER — Ambulatory Visit (INDEPENDENT_AMBULATORY_CARE_PROVIDER_SITE_OTHER): Payer: Medicare Other | Admitting: Podiatry

## 2021-04-20 DIAGNOSIS — M79675 Pain in left toe(s): Secondary | ICD-10-CM

## 2021-04-20 DIAGNOSIS — E1169 Type 2 diabetes mellitus with other specified complication: Secondary | ICD-10-CM | POA: Diagnosis not present

## 2021-04-20 DIAGNOSIS — M205X9 Other deformities of toe(s) (acquired), unspecified foot: Secondary | ICD-10-CM | POA: Insufficient documentation

## 2021-04-20 DIAGNOSIS — B351 Tinea unguium: Secondary | ICD-10-CM | POA: Diagnosis not present

## 2021-04-20 DIAGNOSIS — M79674 Pain in right toe(s): Secondary | ICD-10-CM | POA: Diagnosis not present

## 2021-04-20 NOTE — Progress Notes (Signed)
This patient presents  to my office for at risk foot care.  This patient requires this care by a professional since this patient will be at risk due to having diabetic.This patient is unable to cut nails himself since the patient cannot reach his nails.These nails are painful walking and wearing shoes. She also states she has had severe swollen and painful episodes in her big toe joint twice this year.  She has been diagnosed with gout and has been taking medicine.  She requests evaluation of her big toe joint right foot. This patient presents for at risk foot care today.  General Appearance  Alert, conversant and in no acute stress.  Vascular  Dorsalis pedis and posterior tibial  pulses are palpable  bilaterally.  Capillary return is within normal limits  bilaterally. Temperature is within normal limits  bilaterally.  Neurologic  Senn-Weinstein monofilament wire test within normal limits  bilaterally. Muscle power within normal limits bilaterally.  Nails Thick disfigured discolored nails with subungual debris  from hallux to fifth toes bilaterally. No evidence of bacterial infection or drainage bilaterally.  Orthopedic  No limitations of motion  feet .  No crepitus or effusions noted.  No bony pathology or digital deformities noted.  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  Skin  normotropic skin with no porokeratosis noted bilaterally.  No signs of infections or ulcers noted.     Onychomycosis  Pain in right toes  Pain in left toes  Hallux limitus  Consent was obtained for treatment procedures.   Mechanical debridement of nails 1-5  bilaterally performed with a nail nipper.  Filed with dremel without incident. 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  Discussed this with this patient and made an appointment with Dr.  Posey Pronto for evaluation of her right foot.  She may be a candidate for orthoses.   Return office visit   prn                  Told patient to  return for periodic foot care and evaluation due to potential at risk complications.   Gardiner Barefoot DPM

## 2021-04-23 ENCOUNTER — Telehealth: Payer: Self-pay | Admitting: Nurse Practitioner

## 2021-04-28 ENCOUNTER — Other Ambulatory Visit: Payer: Self-pay | Admitting: Nurse Practitioner

## 2021-04-28 DIAGNOSIS — M109 Gout, unspecified: Secondary | ICD-10-CM

## 2021-04-29 ENCOUNTER — Ambulatory Visit (INDEPENDENT_AMBULATORY_CARE_PROVIDER_SITE_OTHER): Payer: Medicare Other | Admitting: Podiatry

## 2021-04-29 ENCOUNTER — Other Ambulatory Visit: Payer: Self-pay

## 2021-04-29 DIAGNOSIS — E1169 Type 2 diabetes mellitus with other specified complication: Secondary | ICD-10-CM

## 2021-04-29 DIAGNOSIS — L853 Xerosis cutis: Secondary | ICD-10-CM | POA: Diagnosis not present

## 2021-04-29 MED ORDER — AMMONIUM LACTATE 12 % EX LOTN: 1.0000 "application " | TOPICAL_LOTION | CUTANEOUS | 0 refills | Status: AC | PRN

## 2021-05-01 NOTE — Progress Notes (Signed)
Subjective:  Patient ID: Penny Gibson, female    DOB: 1946-04-06,  MRN: [id number]  Chief Complaint  Patient presents with   Nail Problem    Right hallux eval     75 y.o. female presents with the above complaint.  Patient presents with complaint of right foot dry skin.  Patient did not go for quite some time.  There is no itching associated with it.  Though she would like to discuss treatment options for this.  She is known to Dr. Prudence Davidson who does routine foot care for her.  She denies any other acute complaints.  She is a diabetic with last A1c of 6.5%.  She does not moisturize her foot.  She puts Vaseline on her foot.   Review of Systems: Negative except as noted in the HPI. Denies N/V/F/Ch.  Past Medical History:  Diagnosis Date   Anemia    Arthritis    Asthma    Constipation    Diabetes mellitus without complication (HCC)    Dyspnea    Hyperlipidemia    Hypertension    Insomnia    Joint pain    Knee pain    Lactose intolerance    Lower extremity edema    Multiple food allergies    Osteoarthritis    Sinus problem     Current Outpatient Medications:    ammonium lactate (AMLACTIN) 12 % lotion, Apply 1 application topically as needed for dry skin., Disp: 400 g, Rfl: 0   acetaminophen (TYLENOL) 500 MG tablet, Take 500 mg by mouth every 6 (six) hours as needed., Disp: , Rfl:    albuterol (VENTOLIN HFA) 108 (90 Base) MCG/ACT inhaler, INHALE 1 TO 2 PUFFS INTO THE LUNGS EVERY 6 HOURS AS NEEDED FOR WHEEZING OR SHORTNESS OF BREATH, Disp: 6.7 g, Rfl: 0   aspirin EC 81 MG tablet, Take 1 tablet (81 mg total) by mouth 2 (two) times daily., Disp: 84 tablet, Rfl: 0   atorvastatin (LIPITOR) 20 MG tablet, Take 1 tablet (20 mg total) by mouth at bedtime., Disp: 90 tablet, Rfl: 3   diphenhydramine-acetaminophen (TYLENOL PM) 25-500 MG TABS tablet, Take 1 tablet by mouth at bedtime as needed., Disp: , Rfl:    fluticasone (FLONASE) 50 MCG/ACT nasal spray, Place 2 sprays into both nostrils  daily., Disp: 16 g, Rfl: 0   lisinopril (ZESTRIL) 40 MG tablet, Take 1 tablet (40 mg total) by mouth daily., Disp: 90 tablet, Rfl: 3   montelukast (SINGULAIR) 10 MG tablet, Take 1 tablet (10 mg total) by mouth at bedtime., Disp: 30 tablet, Rfl: 5   naproxen (NAPROSYN) 500 MG tablet, Take 1 tablet (500 mg total) by mouth daily as needed (with food)., Disp: 30 tablet, Rfl: 0  Social History   Tobacco Use  Smoking Status Never  Smokeless Tobacco Never    Allergies  Allergen Reactions   Feraheme [Ferumoxytol] Anaphylaxis   Penicillins Anaphylaxis   Citric Acid Rash   Objective:  There were no vitals filed for this visit. There is no height or weight on file to calculate BMI. Constitutional Well developed. Well nourished.  Vascular Dorsalis pedis pulses palpable bilaterally. Posterior tibial pulses palpable bilaterally. Capillary refill normal to all digits.  No cyanosis or clubbing noted. Pedal hair growth normal.  Neurologic Normal speech. Oriented to person, place, and time. Epicritic sensation to light touch grossly present bilaterally.  Dermatologic Severe dry skin noted to the right lower extremity without subjective component of itching.  No fissures or cracking noted.  Orthopedic: Normal joint ROM without pain or crepitus bilaterally. No visible deformities. No bony tenderness.   Radiographs: None Assessment:   1. Xerosis cutis   2. Type 2 diabetes mellitus with other specified complication, without long-term current use of insulin ([place])    Plan:  Patient was evaluated and treated and all questions answered.  Right foot xerosis -I explained to the patient the etiology of xerosis and various treatment options were extensively discussed.  I explained to the patient the importance of maintaining moisturization of the skin with application of over-the-counter lotion such as Eucerin or Luciderm.  Given that she has failed over-the-counter medication I believe she will  benefit from prescription dry skin lotion.  Ammonium lactate was sent to the pharmacy of asked her to apply twice a day.  She states understanding will do so.   Return in about 3 months (around 07/30/2021) for [facility] .

## 2021-05-07 ENCOUNTER — Other Ambulatory Visit: Payer: Self-pay

## 2021-05-07 DIAGNOSIS — M109 Gout, unspecified: Secondary | ICD-10-CM

## 2021-05-07 NOTE — Telephone Encounter (Signed)
Pharmacy refill request for Naproxen 500 mg. Chart supports Rx. Last OV 03/2021 Next OV 07/07/21  Called patient to see if refill was needed and patient states she no longer gets medication from the pharmacy that faxed over the request.

## 2021-05-08 ENCOUNTER — Telehealth: Payer: Self-pay | Admitting: *Deleted

## 2021-05-12 ENCOUNTER — Other Ambulatory Visit: Payer: Self-pay | Admitting: Nurse Practitioner

## 2021-05-12 DIAGNOSIS — J4520 Mild intermittent asthma, uncomplicated: Secondary | ICD-10-CM

## 2021-05-18 ENCOUNTER — Telehealth: Payer: Self-pay | Admitting: Orthopaedic Surgery

## 2021-05-18 NOTE — Telephone Encounter (Signed)
Pt called and states she is going on a trip soon and she needs something showing she had knee surgery.   CB [phone]

## 2021-05-20 ENCOUNTER — Ambulatory Visit (INDEPENDENT_AMBULATORY_CARE_PROVIDER_SITE_OTHER): Payer: Medicare Other | Admitting: Podiatry

## 2021-05-20 ENCOUNTER — Other Ambulatory Visit: Payer: Self-pay

## 2021-05-20 DIAGNOSIS — M205X1 Other deformities of toe(s) (acquired), right foot: Secondary | ICD-10-CM

## 2021-05-20 DIAGNOSIS — M7751 Other enthesopathy of right foot: Secondary | ICD-10-CM | POA: Diagnosis not present

## 2021-05-20 MED ORDER — DICLOFENAC SODIUM 75 MG PO TBEC: 75.0000 mg | DELAYED_RELEASE_TABLET | Freq: Two times a day (BID) | ORAL | 0 refills | Status: DC

## 2021-05-20 NOTE — Telephone Encounter (Signed)
Letter made. Ready for pick up. Called patient no answer LMOM.

## 2021-05-20 NOTE — Telephone Encounter (Signed)
Chart supports Rx Last seen 03/2021 Next OV 06/2021

## 2021-05-20 NOTE — Telephone Encounter (Signed)
Chart supports rx refill Last ov: 03/26/2021 Last refill: 04/20/2021

## 2021-05-22 ENCOUNTER — Encounter: Payer: Self-pay | Admitting: Podiatry

## 2021-05-22 ENCOUNTER — Inpatient Hospital Stay: Payer: Medicare Other | Admitting: Hematology

## 2021-05-22 ENCOUNTER — Ambulatory Visit: Payer: Medicare Other | Admitting: Podiatry

## 2021-05-22 ENCOUNTER — Inpatient Hospital Stay: Payer: Medicare Other

## 2021-05-22 NOTE — Progress Notes (Signed)
Subjective:  Patient ID: Penny Gibson, female    DOB: [birth year],  MRN: [id number]  Chief Complaint  Patient presents with   Toe Pain    Right hallux pain    75 y.o. female presents with the above complaint.  Patient presents with complaint of right first metatarsophalangeal joint pain.  Patient states that this is a new problem.  She states that it is painful to touch painful to walk on.  She states that it has been on for quite some time.  She has not tried anything for it.  She tried making some changes to her shoe gear.  She denies any other acute complaints.  She has not gotten a steroid injection.  Her pain scale is 5 out of 10 sharp shooting in nature.   Review of Systems: Negative except as noted in the HPI. Denies N/V/F/Ch.  Past Medical History:  Diagnosis Date   Anemia    Arthritis    Asthma    Constipation    Diabetes mellitus without complication (HCC)    Dyspnea    Hyperlipidemia    Hypertension    Insomnia    Joint pain    Knee pain    Lactose intolerance    Lower extremity edema    Multiple food allergies    Osteoarthritis    Sinus problem     Current Outpatient Medications:    diclofenac (VOLTAREN) 75 MG EC tablet, Take 1 tablet (75 mg total) by mouth 2 (two) times daily., Disp: 30 tablet, Rfl: 0   acetaminophen (TYLENOL) 500 MG tablet, Take 500 mg by mouth every 6 (six) hours as needed., Disp: , Rfl:    albuterol (VENTOLIN HFA) 108 (90 Base) MCG/ACT inhaler, INHALE 1 TO 2 PUFFS INTO THE LUNGS EVERY 6 HOURS AS NEEDED FOR WHEEZING OR SHORTNESS OF BREATH, Disp: 20.1 g, Rfl: 0   ammonium lactate (AMLACTIN) 12 % lotion, Apply 1 application topically as needed for dry skin., Disp: 400 g, Rfl: 0   aspirin EC 81 MG tablet, Take 1 tablet (81 mg total) by mouth 2 (two) times daily., Disp: 84 tablet, Rfl: 0   atorvastatin (LIPITOR) 20 MG tablet, Take 1 tablet (20 mg total) by mouth at bedtime., Disp: 90 tablet, Rfl: 3   diphenhydramine-acetaminophen (TYLENOL PM)  25-500 MG TABS tablet, Take 1 tablet by mouth at bedtime as needed., Disp: , Rfl:    fluticasone (FLONASE) 50 MCG/ACT nasal spray, Place 2 sprays into both nostrils daily., Disp: 16 g, Rfl: 0   lisinopril (ZESTRIL) 40 MG tablet, Take 1 tablet (40 mg total) by mouth daily., Disp: 90 tablet, Rfl: 3   montelukast (SINGULAIR) 10 MG tablet, Take 1 tablet (10 mg total) by mouth at bedtime., Disp: 30 tablet, Rfl: 5   naproxen (NAPROSYN) 500 MG tablet, TAKE 1 TABLET(500 MG) BY MOUTH TWICE DAILY WITH FOOD AS NEEDED, Disp: 30 tablet, Rfl: 0  Social History   Tobacco Use  Smoking Status Never  Smokeless Tobacco Never    Allergies  Allergen Reactions   Feraheme [Ferumoxytol] Anaphylaxis   Penicillins Anaphylaxis   Citric Acid Rash   Objective:  There were no vitals filed for this visit. There is no height or weight on file to calculate BMI. Constitutional Well developed. Well nourished.  Vascular Dorsalis pedis pulses palpable bilaterally. Posterior tibial pulses palpable bilaterally. Capillary refill normal to all digits.  No cyanosis or clubbing noted. Pedal hair growth normal.  Neurologic Normal speech. Oriented to person, place, and time.  Epicritic sensation to light touch grossly present bilaterally.  Dermatologic Nails well groomed and normal in appearance. No open wounds. No skin lesions.  Orthopedic: Pain on palpation right first metatarsophalangeal joint.  Pain with range of motion of the MPJ.  Pain on the prominent medial eminence.  No crepitus was clinically appreciated.  Hallux limitus noted.   Radiographs: None Assessment:   1. Hallux limitus, right   2. Capsulitis of metatarsophalangeal (MTP) joint of right foot    Plan:  Patient was evaluated and treated and all questions answered.  Right first MTP capsulitis with underlying hallux limitus -All questions and concerns were discussed with the patient in extensive detail.  I explained the etiology of MTP capsulitis and  various treatment options were discussed -Given the amount of pain that she is clinically having I believe she will benefit from steroid injection to help decrease acute inflammatory component associated pain.  Patient agrees with plan like to proceed with steroid injection -A steroid injection was performed at right first MTP capsulitis using 1% plain Lidocaine and 10 mg of Kenalog. This was well tolerated. -Diclofenac medication was sent to the pharmacy to help with pain control   No follow-ups on file.

## 2021-06-04 ENCOUNTER — Other Ambulatory Visit: Payer: Self-pay

## 2021-06-04 ENCOUNTER — Inpatient Hospital Stay: Payer: Medicare Other | Attending: Adult Health | Admitting: *Deleted

## 2021-06-04 ENCOUNTER — Encounter: Payer: Self-pay | Admitting: *Deleted

## 2021-06-04 DIAGNOSIS — C50411 Malignant neoplasm of upper-outer quadrant of right female breast: Secondary | ICD-10-CM

## 2021-06-04 DIAGNOSIS — Z170 Estrogen receptor positive status [ER+]: Secondary | ICD-10-CM

## 2021-06-04 NOTE — Progress Notes (Signed)
°  2 Identifiers used for verification purposes only for this visit. SCP reviewed and completed. SDOH assessed and completed. Pt seems to be doing well. Pt has some pain to her right knee intermittently. She takes Tylenol as needed. 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 She plans on having the right knee done during the upcoming summer.Pt will have next diagnostic mammo in February 2023. Pt had a negative Cologuard test.Last bone density was 2020. Pt does not take any anti-estrogens. Pt has an area of concern which is a flat mole under her left eye. She says it doesn't give her any problems. It has been removed twice she says, and grew back. I am referring her to a dermatologist.

## 2021-06-05 ENCOUNTER — Other Ambulatory Visit: Payer: Self-pay | Admitting: *Deleted

## 2021-06-05 DIAGNOSIS — Z170 Estrogen receptor positive status [ER+]: Secondary | ICD-10-CM

## 2021-06-08 ENCOUNTER — Other Ambulatory Visit: Payer: Self-pay | Admitting: *Deleted

## 2021-07-07 ENCOUNTER — Encounter: Payer: Self-pay | Admitting: Nurse Practitioner

## 2021-07-07 ENCOUNTER — Ambulatory Visit (INDEPENDENT_AMBULATORY_CARE_PROVIDER_SITE_OTHER): Payer: Medicare Other | Admitting: Nurse Practitioner

## 2021-07-07 ENCOUNTER — Other Ambulatory Visit: Payer: Self-pay

## 2021-07-07 VITALS — BP 142/90 | HR 100 | Temp 97.0°F | Ht 61.0 in | Wt 217.0 lb

## 2021-07-07 DIAGNOSIS — M109 Gout, unspecified: Secondary | ICD-10-CM

## 2021-07-07 DIAGNOSIS — E785 Hyperlipidemia, unspecified: Secondary | ICD-10-CM

## 2021-07-07 DIAGNOSIS — D509 Iron deficiency anemia, unspecified: Secondary | ICD-10-CM

## 2021-07-07 DIAGNOSIS — J302 Other seasonal allergic rhinitis: Secondary | ICD-10-CM | POA: Diagnosis not present

## 2021-07-07 DIAGNOSIS — I10 Essential (primary) hypertension: Secondary | ICD-10-CM | POA: Diagnosis not present

## 2021-07-07 DIAGNOSIS — E1169 Type 2 diabetes mellitus with other specified complication: Secondary | ICD-10-CM | POA: Diagnosis not present

## 2021-07-07 DIAGNOSIS — Z1211 Encounter for screening for malignant neoplasm of colon: Secondary | ICD-10-CM

## 2021-07-07 LAB — CBC
HCT: 31.8 % — ABNORMAL LOW (ref 36.0–46.0)
Hemoglobin: 9.7 g/dL — ABNORMAL LOW (ref 12.0–15.0)
MCHC: 30.4 g/dL (ref 30.0–36.0)
MCV: 78.4 fl (ref 78.0–100.0)
Platelets: 375 10*3/uL (ref 150.0–400.0)
RBC: 4.06 Mil/uL (ref 3.87–5.11)
RDW: 19.2 % — ABNORMAL HIGH (ref 11.5–15.5)
WBC: 9.9 10*3/uL (ref 4.0–10.5)

## 2021-07-07 LAB — BASIC METABOLIC PANEL
BUN: 14 mg/dL (ref 6–23)
CO2: 29 mEq/L (ref 19–32)
Calcium: 9.3 mg/dL (ref 8.4–10.5)
Chloride: 100 mEq/L (ref 96–112)
Creatinine, Ser: 1.05 mg/dL (ref 0.40–1.20)
GFR: 52.47 mL/min — ABNORMAL LOW (ref >60.00)
Glucose, Bld: 104 mg/dL — ABNORMAL HIGH (ref 70–99)
Potassium: 4.4 mEq/L (ref 3.5–5.1)
Sodium: 139 mEq/L (ref 135–145)

## 2021-07-07 LAB — HEMOGLOBIN A1C: Hgb A1c MFr Bld: 6.8 % — ABNORMAL HIGH (ref 4.6–6.5)

## 2021-07-07 LAB — URIC ACID: Uric Acid, Serum: 8.1 mg/dL — ABNORMAL HIGH (ref 2.4–7.0)

## 2021-07-07 MED ORDER — LISINOPRIL 40 MG PO TABS: 40.0000 mg | ORAL_TABLET | Freq: Every day | ORAL | 3 refills | Status: DC

## 2021-07-07 MED ORDER — MONTELUKAST SODIUM 10 MG PO TABS: 10.0000 mg | ORAL_TABLET | Freq: Every day | ORAL | 3 refills | Status: DC

## 2021-07-07 NOTE — Assessment & Plan Note (Addendum)
Repeat hgbA1c: 6.8% Advised about need for dieatry modification and regular exercise Has upcoming appt for eye exam. Report requested Repeat in 85months

## 2021-07-07 NOTE — Assessment & Plan Note (Signed)
Improving BP but not at goal Reports she is completed with lisinopril 40mg  BP Readings from Last 3 Encounters:  07/07/21 (!) 142/90  04/20/21 (!) 164/103  03/26/21 140/90   Advised to maintain DASH diet and start daily exercise (low impact e.g walking, chair exercise) Maintain current med dose Repeat BMP

## 2021-07-07 NOTE — Assessment & Plan Note (Signed)
Repeat cbc and cologuard Denies any melena or hematochezia

## 2021-07-07 NOTE — Progress Notes (Addendum)
Subjective:  Patient ID: Penny Gibson, female    DOB: [birth year]  Age: 76 y.o. MRN: [id number]  CC: Follow-up (3 month f/u on DM, HTN, and cholesterol. Pt does not check BP at home. /Pt is fasting today)  HPI  Gouty arthritis of right great toe Recent exacerbation resolved with intra-articular joint injection by podiatry. Advised about possible dietary triggers. Advised to keep a food log to help identify her trigger.  Repeat uric acid at 8.1% and bmp (stable) today Start allopurinol 100mg  daily Repeat uric acid in 101months   Diabetes mellitus (Penny Gibson) Repeat hgbA1c: 6.8% Advised about need for dieatry modification and regular exercise Has upcoming appt for eye exam. Report requested Repeat in 42months  Iron deficiency anemia Repeat cbc and cologuard Denies any melena or hematochezia  Essential hypertension Improving BP but not at goal Reports she is completed with lisinopril 40mg  BP Readings from Last 3 Encounters:  07/07/21 (!) 142/90  04/20/21 (!) 164/103  03/26/21 140/90   Advised to maintain DASH diet and start daily exercise (low impact e.g walking, chair exercise) Maintain current med dose Repeat BMP  Hyperlipidemia associated with type 2 diabetes mellitus (Penny Gibson) Reports she is unable to tolerate atorvastatin. It caused dizziness. This resolved with discontinuation of medication. She start OTC garlique supplement. Denies any GO side effects.  Wt Readings from Last 3 Encounters:  07/07/21 217 lb (98.4 kg)  03/26/21 221 lb (100.2 kg)  11/20/20 216 lb 12.8 oz (98.3 kg)    Reviewed past Medical, Social and Family history today.  Outpatient Medications Prior to Visit  Medication Sig Dispense Refill   acetaminophen (TYLENOL) 500 MG tablet Take 500 mg by mouth every 6 (six) hours as needed.     albuterol (VENTOLIN HFA) 108 (90 Base) MCG/ACT inhaler INHALE 1 TO 2 PUFFS INTO THE LUNGS EVERY 6 HOURS AS NEEDED FOR WHEEZING OR SHORTNESS OF BREATH 20.1 g 0   ammonium  lactate (AMLACTIN) 12 % lotion Apply 1 application topically as needed for dry skin. 400 g 0   aspirin EC 81 MG tablet Take 1 tablet (81 mg total) by mouth 2 (two) times daily. 84 tablet 0   Cholecalciferol (VITAMIN D3) 1.25 MG (50000 UT) TABS Take 1 tablet by mouth daily.     diphenhydramine-acetaminophen (TYLENOL PM) 25-500 MG TABS tablet Take 1 tablet by mouth at bedtime as needed.     fluticasone (FLONASE) 50 MCG/ACT nasal spray Place 2 sprays into both nostrils daily. 16 g 0   Garlic (GARLIQUE PO) Take 1 tablet by mouth daily.     Turmeric 500 MG CAPS Take 1,000 mg by mouth daily.     lisinopril (ZESTRIL) 40 MG tablet Take 1 tablet (40 mg total) by mouth daily. 90 tablet 3   montelukast (SINGULAIR) 10 MG tablet Take 1 tablet (10 mg total) by mouth at bedtime. 30 tablet 5   atorvastatin (LIPITOR) 20 MG tablet Take 1 tablet (20 mg total) by mouth at bedtime. (Patient not taking: Reported on 06/04/2021) 90 tablet 3   No facility-administered medications prior to visit.    ROS See HPI  Objective:  BP (!) 142/90 (BP Location: Left Arm, Patient Position: Sitting, Cuff Size: Large)    Pulse 100    Temp (!) 97 F (36.1 C) (Temporal)    Ht 5\' 1"  (1.549 m)    Wt 217 lb (98.4 kg)    SpO2 97%    BMI 41.00 kg/m   Physical Exam Cardiovascular:  Rate and Rhythm: Normal rate and regular rhythm.     Pulses: Normal pulses.     Heart sounds: Normal heart sounds.  Pulmonary:     Effort: Pulmonary effort is normal.     Breath sounds: Normal breath sounds.  Musculoskeletal:     Right lower leg: No edema.     Left lower leg: No edema.  Neurological:     Mental Status: She is alert and oriented to person, place, and time.   Assessment & Plan:  This visit occurred during the SARS-CoV-2 public health emergency.  Safety protocols were in place, including screening questions prior to the visit, additional usage of staff PPE, and extensive cleaning of exam room while observing appropriate contact  time as indicated for disinfecting solutions.   [name] was seen today for follow-up.  Diagnoses and all orders for this visit:  Essential hypertension -     Basic metabolic panel -     AMB Referral to Penny Gibson -     lisinopril (ZESTRIL) 40 MG tablet; Take 1 tablet (40 mg total) by mouth daily.  Type 2 diabetes mellitus with other specified complication, without long-term current use of insulin (HCC) -     Hemoglobin A1c -     Basic metabolic panel -     AMB Referral to [name]  Gouty arthritis of right great toe -     Uric acid -     allopurinol (ZYLOPRIM) 100 MG tablet; Take 1 tablet (100 mg total) by mouth daily.  Colon cancer screening -     Cologuard; Future  Iron deficiency anemia, unspecified iron deficiency anemia type -     CBC  Hyperlipidemia associated with type 2 diabetes mellitus (HCC) -     AMB Referral to Penny Gibson  Other seasonal allergic rhinitis -     montelukast (SINGULAIR) 10 MG tablet; Take 1 tablet (10 mg total) by mouth at bedtime.    Problem List Items Addressed This Visit       Cardiovascular and Mediastinum   Essential hypertension - Primary (Chronic)    Improving BP but not at goal Reports she is completed with lisinopril 40mg  BP Readings from Last 3 Encounters:  07/07/21 (!) 142/90  04/20/21 (!) 164/103  03/26/21 140/90   Advised to maintain DASH diet and start daily exercise (low impact e.g walking, chair exercise) Maintain current med dose Repeat BMP      Relevant Medications   lisinopril (ZESTRIL) 40 MG tablet   Other Relevant Orders   Basic metabolic panel (Completed)   AMB Referral to [name]     Respiratory   Other seasonal allergic rhinitis   Relevant Medications   montelukast (SINGULAIR) 10 MG tablet     Endocrine   Diabetes mellitus (Penny Gibson)    Repeat hgbA1c: 6.8% Advised about need for dieatry modification and regular exercise Has upcoming appt  for eye exam. Report requested Repeat in 74months      Relevant Medications   lisinopril (ZESTRIL) 40 MG tablet   Other Relevant Orders   Hemoglobin A1c (Completed)   Basic metabolic panel (Completed)   AMB Referral to [name]   Hyperlipidemia associated with type 2 diabetes mellitus ([place])    Reports she is unable to tolerate atorvastatin. It caused dizziness. This resolved with discontinuation of medication. She start OTC garlique supplement. Denies any GO side effects.      Relevant Medications   lisinopril (ZESTRIL) 40 MG tablet  Other Relevant Orders   AMB Referral to [facility]     Musculoskeletal and Integument   Gouty arthritis of right great toe    Recent exacerbation resolved with intra-articular joint injection by podiatry. Advised about possible dietary triggers. Advised to keep a food log to help identify her trigger.  Repeat uric acid at 8.1% and bmp (stable) today Start allopurinol 100mg  daily Repeat uric acid in 77months       Relevant Medications   allopurinol (ZYLOPRIM) 100 MG tablet   Other Relevant Orders   Uric acid (Completed)     Other   Iron deficiency anemia    Repeat cbc and cologuard Denies any melena or hematochezia      Relevant Orders   CBC (Completed)   Other Visit Diagnoses     Colon cancer screening       Relevant Orders   Cologuard       Follow-up: Return in about 3 months (around 10/05/2021) for DM and HTN, hyperlipidemia (fasting).  [name], NP

## 2021-07-07 NOTE — Assessment & Plan Note (Addendum)
Recent exacerbation resolved with intra-articular joint injection by podiatry. Advised about possible dietary triggers. Advised to keep a food log to help identify her trigger.  Repeat uric acid at 8.1% and bmp (stable) today Start allopurinol 100mg  daily Repeat uric acid in 91months

## 2021-07-07 NOTE — Assessment & Plan Note (Signed)
Reports she is unable to tolerate atorvastatin. It caused dizziness. This resolved with discontinuation of medication. She start OTC garlique supplement. Denies any GO side effects.

## 2021-07-07 NOTE — Patient Instructions (Addendum)
Cologuard kit will be sent to you for repeat colon cancer screen.  Go to lab for blood draw.  Have eye exam report faxed to me.  Start daily home video exercise (20-30mns a day).

## 2021-07-09 ENCOUNTER — Telehealth: Payer: Self-pay | Admitting: *Deleted

## 2021-07-09 MED ORDER — ALLOPURINOL 100 MG PO TABS: 100.0000 mg | ORAL_TABLET | Freq: Every day | ORAL | 6 refills | Status: DC

## 2021-07-09 NOTE — Chronic Care Management (AMB) (Signed)
°  Chronic Care Management   Outreach Note  07/09/2021 Name: Penny Gibson MRN: [id number] DOB: 28-Nov-1945  Penny Gibson is a 76 y.o. year old female who is a primary care patient of [name], [name], NP. I reached out to [name] by phone today in response to a referral sent by [name]'s primary care provider.  An unsuccessful telephone outreach was attempted today. The patient was referred to the case management team for assistance with care management and care coordination.   Follow Up Plan: A HIPAA compliant phone message was left for the patient providing contact information and requesting a return call.  If patient returns call to provider office, please advise to call Embedded Care Management Care Guide Antwian [place]ana at [place], [place] [facility]  Direct Dial: [phone]

## 2021-07-09 NOTE — Addendum Note (Signed)
Addended by: [name] L on: 07/09/2021 01:10 PM   Modules accepted: Orders

## 2021-07-14 ENCOUNTER — Telehealth: Payer: Self-pay | Admitting: Nurse Practitioner

## 2021-07-17 NOTE — Telephone Encounter (Signed)
Patient notified and verbalized understanding. Pt states she has also contacted the foot and has an appointment scheduled for 07/22/21

## 2021-07-20 ENCOUNTER — Encounter: Payer: Self-pay | Admitting: Nurse Practitioner

## 2021-07-20 ENCOUNTER — Other Ambulatory Visit: Payer: Self-pay | Admitting: Nurse Practitioner

## 2021-07-21 DIAGNOSIS — D23112 Other benign neoplasm of skin of right lower eyelid, including canthus: Secondary | ICD-10-CM | POA: Diagnosis not present

## 2021-07-21 DIAGNOSIS — H43813 Vitreous degeneration, bilateral: Secondary | ICD-10-CM | POA: Diagnosis not present

## 2021-07-21 DIAGNOSIS — H26491 Other secondary cataract, right eye: Secondary | ICD-10-CM | POA: Diagnosis not present

## 2021-07-21 DIAGNOSIS — R7303 Prediabetes: Secondary | ICD-10-CM | POA: Diagnosis not present

## 2021-07-21 LAB — HM DIABETES EYE EXAM

## 2021-07-21 NOTE — Chronic Care Management (AMB) (Signed)
Chronic Care Management   Note  07/21/2021 Name: Penny Gibson MRN: [id number] DOB: March 13, 1946  Penny Gibson is a 76 y.o. year old female who is a primary care patient of [name], [name], NP. I reached out to [name] by phone today in response to a referral sent by Penny Gibson PCP.  Penny Gibson was given information about Chronic Care Management services today including:  CCM service includes personalized support from designated clinical staff supervised by her physician, including individualized plan of care and coordination with other care providers 24/7 contact phone numbers for assistance for urgent and routine care needs. Service will only be billed when office clinical staff spend 20 minutes or more in a month to coordinate care. Only one practitioner may furnish and bill the service in a calendar month. The patient may stop CCM services at any time (effective at the end of the month) by phone call to the office staff. The patient is responsible for co-pay (up to 20% after annual deductible is met) if co-pay is required by the individual health plan.   Patient agreed to services and verbal consent obtained.   Follow up plan: Telephone appointment with care management team member scheduled for: 08/20/2021  Julian Hy, [place] [facility]  Direct Dial: [phone]

## 2021-07-22 ENCOUNTER — Ambulatory Visit: Payer: Medicare Other | Admitting: Podiatry

## 2021-07-27 ENCOUNTER — Ambulatory Visit (INDEPENDENT_AMBULATORY_CARE_PROVIDER_SITE_OTHER): Payer: Medicare Other

## 2021-07-27 ENCOUNTER — Other Ambulatory Visit: Payer: Self-pay

## 2021-07-27 ENCOUNTER — Ambulatory Visit (INDEPENDENT_AMBULATORY_CARE_PROVIDER_SITE_OTHER): Payer: Medicare Other | Admitting: Podiatry

## 2021-07-27 ENCOUNTER — Encounter: Payer: Self-pay | Admitting: Podiatry

## 2021-07-27 DIAGNOSIS — M7750 Other enthesopathy of unspecified foot: Secondary | ICD-10-CM

## 2021-07-27 DIAGNOSIS — M10472 Other secondary gout, left ankle and foot: Secondary | ICD-10-CM | POA: Diagnosis not present

## 2021-07-27 DIAGNOSIS — M7752 Other enthesopathy of left foot: Secondary | ICD-10-CM | POA: Diagnosis not present

## 2021-07-27 DIAGNOSIS — M778 Other enthesopathies, not elsewhere classified: Secondary | ICD-10-CM | POA: Diagnosis not present

## 2021-07-27 DIAGNOSIS — E1169 Type 2 diabetes mellitus with other specified complication: Secondary | ICD-10-CM | POA: Diagnosis not present

## 2021-07-27 MED ORDER — DEXAMETHASONE SODIUM PHOSPHATE 120 MG/30ML IJ SOLN: 4.0000 mg | Freq: Once | INTRAMUSCULAR | Status: DC

## 2021-07-27 NOTE — Progress Notes (Signed)
°  Subjective:  Patient ID: Penny Gibson, female    DOB: 08-Dec-1945,   MRN: [id number]  Chief Complaint  Patient presents with   Foot Pain     left foot pain swelling all the way up to ankle    76 y.o. female presents for concern of new foot pain and swelling in the leftt foot and ankle that started about a week ago. Relates she started taking the allopurinol and then the foot started swelling and getting painful. Here today for evaluation. Has a history of gout on the right foot . Denies any other pedal complaints. Denies n/v/f/c.   Past Medical History:  Diagnosis Date   Anemia    Arthritis    Asthma    Constipation    Diabetes mellitus without complication (HCC)    Dyspnea    Hyperlipidemia    Hypertension    Insomnia    Joint pain    Knee pain    Lactose intolerance    Lower extremity edema    Multiple food allergies    Osteoarthritis    Sinus problem     Objective:  Physical Exam: Vascular: DP/PT pulses 2/4 bilateral. CFT <3 seconds. Normal hair growth on digits. No edema.  Skin. No lacerations or abrasions bilateral feet.  Musculoskeletal: MMT 5/5 bilateral lower extremities in DF, PF, Inversion and Eversion. Deceased ROM in DF of ankle joint. Tender on the left foot over second and third TMTJ.  Neurological: Sensation intact to light touch.   Assessment:   1. Capsulitis of left foot   2. Type 2 diabetes mellitus with other specified complication, without long-term current use of insulin ([employer])   3. Acute gout due to other secondary cause involving toe of left foot      Plan:  Patient was evaluated and treated and all questions answered. -Xrays reviewed -Discussed treatement options for gouty arthritis and gout education provided. -Patient opted for injection. After oral consent, injected left third TMTJ with 1cc lidocaine and marcaine plain mixed with and Dexmethasone phosphate without complication; post injection care explained. -Discussed diet and  modifications.  -Advised patient to call if symptoms are not improved within 1 week -Patient to return in 3 weeks for re-check/further discussion for long term management of gout or sooner if condition worsens.   Lorenda Peck, DPM

## 2021-08-19 ENCOUNTER — Telehealth: Payer: Self-pay

## 2021-08-19 NOTE — Progress Notes (Signed)
? ? ?Chronic Care Management ?Pharmacy Assistant  ? ?Name: Penny Gibson  MRN: [id number] DOB: 1945-12-13 ? ?Chart Review for clinical pharmacist on 09/17/2021 at 1:00 pm. ? ?Conditions to be addressed/monitored: ?HTN, HLD, DMII, Asthma, Osteoarthritis, and Cardiomegaly,Vitamin D deficiency,Malignant neoplasm of right breast ? ?Primary concerns for visit include: ?Patient states she would like to discuss changing her blood pressure medication since she has to use the bathroom after taking it.Patient reports she unsure she can stop because she been taking lisinopril for many years. ?Patient reports she is not taking Atorvastatin due to having symptoms of dizziness, but reports she is taking Garlique.  ? ?Recent office visits:  ?07/14/2021 [name] NP (PCP) Stop allopurinol ?07/07/2021 [name] NP (PCP) Start allopurinol 100mg  daily, per chart note patient stop taking Atorvastatin, AMB Referral to [facility], Return in about 3 months  ?03/26/2021 [name] NP (PCP) start naproxen 500 mg PRN, start Montelukast 10 mg daily, start Flonase 50 MCG/ACT 2 sprays daily, stop Allegra , stop Zyrtec, Ambulatory referral to [facility] in about 3 months  ?03/06/2021 [place] (PCP Office) Medicare Wellness completed  ? ?Recent consult visits:  ?07/27/2021 Dr. Blenda Mounts MD (Podiatry) Dexamethasone Sodium Phosphate 4 mg injection given, Return in about 3 weeks  ?06/04/2021 [name] RN (Oncology) No Medication Changes noted ?05/20/2021 Boneta Lucks DPM (Podiatry)  start Diclofenac Sodium 75 mg 2 times daily, Steroid injection given - Kenalog 10 mg ?04/29/2021 Boneta Lucks DPM (Podiatry) Start Ammonium Lactate 12% PRN  ?04/20/2021 [name] DPM (Podiatry) No Medication Changes noted ? ?Hospital visits:  ?None in previous 6 months ? ?Have you seen any other providers since your last visit?  ? Patient denies seeing any other providers. ? ?Any changes in your medications or health?   ? Patient denies any changes in her medications or health. ? ?Any side effects from any medications?  ? Patient denies any side effects from her current medications, but reports using the bathroom a lot after taking her hypertension regimen.  ? ?Do you have an symptoms or problems not managed by your medications?  ? Patient denies any issue at this time. ? ?Any concerns about your health right now?  ? Patient states she would like to discuss changing her blood pressure medication since she has to use the bathroom after taking it.Patient reports she unsure she can stop because she been taking lisinopril for many years. ? ?Has your provider asked that you check blood pressure, blood sugar, or follow special diet at home?  ? Patient reports she does not check her blood pressure at home since her blood pressure is broken.Patient states she is in the process of purchasing another one soon.Patient states her blood pressure is always elevated when she has appointment due to white coat syndrome.  ? ?Do you get any type of exercise on a regular basis?  ? 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 Patient states she is also trying to loose some weight, so she can have her right knee replace. ? ?Can you think of a goal you would like to reach for your health?  ? Patient will inform the clinical pharmacist. ? ?Do you have any problems getting your medications?  ? Patient denies any issue getting her medications. ? ?Is there anything that you would like to discuss during the appointment?  ? Patient states she would like to discuss changing her  blood pressure medication since she has to use the bathroom after taking it.Patient reports she unsure she can stop because she been taking lisinopril for many years. ? Patient reports she is not taking Atorvastatin due to having symptoms of dizziness, but reports she is taking  Garlique. ? ?Please bring medications and supplements to appointment ? ?Medications: ?Outpatient Encounter Medications as of 08/19/2021  ?Medication Sig  ? acetaminophen (TYLENOL) 500 MG tablet Take 500 mg by mouth every 6 (six) hours as needed.  ? albuterol (VENTOLIN HFA) 108 (90 Base) MCG/ACT inhaler INHALE 1 TO 2 PUFFS INTO THE LUNGS EVERY 6 HOURS AS NEEDED FOR WHEEZING OR SHORTNESS OF BREATH  ? ammonium lactate (AMLACTIN) 12 % lotion Apply 1 application topically as needed for dry skin.  ? aspirin EC 81 MG tablet Take 1 tablet (81 mg total) by mouth 2 (two) times daily.  ? Cholecalciferol (VITAMIN D3) 1.25 MG (50000 UT) TABS Take 1 tablet by mouth daily.  ? diphenhydramine-acetaminophen (TYLENOL PM) 25-500 MG TABS tablet Take 1 tablet by mouth at bedtime as needed.  ? fluticasone (FLONASE) 50 MCG/ACT nasal spray Place 2 sprays into both nostrils daily.  ? Garlic (GARLIQUE PO) Take 1 tablet by mouth daily.  ? lisinopril (ZESTRIL) 40 MG tablet Take 1 tablet (40 mg total) by mouth daily.  ? montelukast (SINGULAIR) 10 MG tablet Take 1 tablet (10 mg total) by mouth at bedtime.  ? Turmeric 500 MG CAPS Take 1,000 mg by mouth daily.  ? ?Facility-Administered Encounter Medications as of 08/19/2021  ?Medication  ? dexamethasone (DECADRON) injection 4 mg  ? ? ?Care Gaps: ?Shingrix Vaccine ?Fecal DNA (Cologuard) ?COVID-19 Vaccine ?HTN: 142/90 on 07/07/2021 ? ?Star Rating Drugs: ?Lisinopril 40 mg last filled on 07/07/2021 for 90 day supply at [facility]. ? ?Medication fill Gaps: ?None ID ? ? ?Bessie Kellihan,CPA ?Clinical Pharmacist Assistant ?[phone]  ? ?

## 2021-08-20 ENCOUNTER — Telehealth: Payer: Medicare Other

## 2021-09-16 ENCOUNTER — Telehealth: Payer: Self-pay

## 2021-09-16 LAB — HEMOGLOBIN A1C: Hemoglobin A1C: 6.5

## 2021-09-16 NOTE — Progress Notes (Signed)
Chronic Care Management  APPOINTMENT REMINDER ? ? ?Penny Gibson was reminded to have all medications, supplements and any blood glucose and blood pressure readings available for review with Junius Argyle, Pharm. D, at her telephone visit on 09/17/2021 at 1:00 pm. ? ? ?Patient confirm appointment. ? ?[name] ?Clinical Pharmacist Assistant ?[phone]  ? ?

## 2021-09-17 ENCOUNTER — Ambulatory Visit (INDEPENDENT_AMBULATORY_CARE_PROVIDER_SITE_OTHER): Payer: Medicare Other

## 2021-09-17 DIAGNOSIS — J302 Other seasonal allergic rhinitis: Secondary | ICD-10-CM

## 2021-09-17 DIAGNOSIS — E1169 Type 2 diabetes mellitus with other specified complication: Secondary | ICD-10-CM

## 2021-09-17 DIAGNOSIS — M109 Gout, unspecified: Secondary | ICD-10-CM

## 2021-09-17 DIAGNOSIS — I10 Essential (primary) hypertension: Secondary | ICD-10-CM

## 2021-09-17 DIAGNOSIS — J4520 Mild intermittent asthma, uncomplicated: Secondary | ICD-10-CM

## 2021-09-17 NOTE — Progress Notes (Signed)
? ?Chronic Care Management ?Pharmacy Note ? ?09/18/2021 ?Name:  [name] MRN:  [id number] DOB:  02-Dec-1945 ? ?Summary: ?Patient presents for initial CCM consult.  ? ?-Patient reports polyuria, but does drink 8+ glasses of water daily due to her gout.  ?-Discussed importance of statins in reducing cardiovascular risk. Patient would consider it, but wants to wait until after updated lab work.  ? ?-Discussed with patient updated GINA recommendations supporting use of low-dose, as needed ICS-Formoterol combination.  ? ?She was considering upstream pharmacy to help with medication adherence, but will wait until next visit to discuss further.  ? ?Recommendations/Changes made from today's visit: ?-Recommend rechecking fasting lipid panel  ?-Recommend starting rosuvastatin 5 mg daily if patient amenable.  ? ?-Recommend STOPPING albuterol ?-Recommend STARTING Symbicort 80-4.5 mcg 1-2 puffs twice daily as needed for shortness of breath ? ?-Patient set goal to increase activity that is not strenuous on her knees (stretches, chair/arm exercises) 1-2 times weekly.  ? ?Plan: ?CPP follow-up 2 months ? ?Recommended Problem List Changes:  ?Add: Chronic Kidney Disease Stage 3a  ? ?Subjective: ?Penny Gibson is a 76 y.o. year old female who is a primary patient of [name], [name], NP.  The CCM team was consulted for assistance with disease management and care coordination needs.   ? ?Engaged with patient by telephone for initial visit in response to provider referral for pharmacy case management and/or care coordination services.  ? ?Consent to Services:  ?The patient was given the following information about Chronic Care Management services today, agreed to services, and gave verbal consent: 1. CCM service includes personalized support from designated clinical staff supervised by the primary care provider, including individualized plan of care and coordination with other care providers 2. 24/7 contact phone numbers for  assistance for urgent and routine care needs. 3. Service will only be billed when office clinical staff spend 20 minutes or more in a month to coordinate care. 4. Only one practitioner may furnish and bill the service in a calendar month. 5.The patient may stop CCM services at any time (effective at the end of the month) by phone call to the office staff. 6. The patient will be responsible for cost sharing (co-pay) of up to 20% of the service fee (after annual deductible is met). Patient agreed to services and consent obtained. ? ?Patient Care Team: ?[name], [name], NP as PCP - General (Internal Medicine) ?[name], MD as Consulting Physician (Hematology) ?[name], MD as Consulting Physician (General Surgery) ?[name], MD as Consulting Physician (Radiation Oncology) ?Gardenia Phlegm, NP as Nurse Practitioner (Hematology and Oncology) ?[name], RN as Registered Nurse ?[name], [facility] (Pharmacist) ? ?Recent office visits: ?07/14/2021 Penny Lacy NP (PCP) Stop allopurinol ?07/07/2021 Penny Lacy NP (PCP) Start allopurinol $RemoveBeforeDEI'100mg'ofsFVdMEiNPgjlRT$  daily, per chart note patient stop taking Atorvastatin, AMB Referral to [place], Return in about 3 months  ?03/26/2021 Penny Lacy NP (PCP) start naproxen 500 mg PRN, start Montelukast 10 mg daily, start Flonase 50 MCG/ACT 2 sprays daily, stop Allegra , stop Zyrtec, Ambulatory referral to [place] in about 3 months  ?03/06/2021 [place] (PCP Office) Medicare Wellness completed  ? ?Recent consult visits: ?07/27/2021 Dr. Blenda Mounts MD (Podiatry) Dexamethasone Sodium Phosphate 4 mg injection given, Return in about 3 weeks  ?06/04/2021 Penny College RN (Oncology) No Medication Changes noted ?05/20/2021 Penny Gibson DPM (Podiatry)  start Diclofenac Sodium 75 mg 2 times daily, Steroid injection given - Kenalog 10 mg ?04/29/2021 Penny Gibson  Patel DPM (Podiatry) Start Ammonium Lactate 12% PRN  ?04/20/2021 Penny Gibson DPM  (Podiatry) No Medication Changes noted ? ?Hospital visits: ?None in previous 6 months ? ? ?Objective: ? ?Lab Results  ?Component Value Date  ? CREATININE 1.05 07/07/2021  ? BUN 14 07/07/2021  ? GFR 52.47 (L) 07/07/2021  ? GFRNONAA 15 (L) 03/14/2020  ? GFRAA 17 (L) 03/14/2020  ? NA 139 07/07/2021  ? K 4.4 07/07/2021  ? CALCIUM 9.3 07/07/2021  ? CO2 29 07/07/2021  ? GLUCOSE 104 (H) 07/07/2021  ? ? ?Lab Results  ?Component Value Date/Time  ? HGBA1C 6.8 (H) 07/07/2021 09:38 AM  ? HGBA1C 6.5 03/26/2021 08:42 AM  ? GFR 52.47 (L) 07/07/2021 09:38 AM  ? GFR 49.72 (L) 03/26/2021 08:42 AM  ? MICROALBUR 0.7 08/05/2020 08:26 AM  ?  ?Last diabetic Eye exam:  ?Lab Results  ?Component Value Date/Time  ? HMDIABEYEEXA No Retinopathy 07/21/2021 12:00 AM  ?  ?Last diabetic Foot exam: No results found for: HMDIABFOOTEX  ? ?Lab Results  ?Component Value Date  ? CHOL 195 03/26/2021  ? HDL 44.90 03/26/2021  ? LDLCALC 112 (H) 03/26/2021  ? TRIG 189.0 (H) 03/26/2021  ? CHOLHDL 4 03/26/2021  ? ? ? ?  Latest Ref Rng & Units 08/05/2020  ?  8:26 AM 03/14/2020  ?  3:05 PM 03/07/2020  ? 10:00 AM  ?Hepatic Function  ?Total Protein 6.0 - 8.3 g/dL 8.4   7.6   8.6    ?Albumin 3.5 - 5.2 g/dL 4.1   3.2   4.0    ?AST 0 - 37 U/L _0 ?ALT 0 - 35 U/L _1 ?Alk Phosphatase 39 - 117 U/L 90   62   77    ?Total Bilirubin 0.2 - 1.2 mg/dL 0.3   0.6   0.3    ? ? ?Lab Results  ?Component Value Date/Time  ? TSH 1.750 12/28/2019 09:32 AM  ? TSH 1.27 03/13/2018 09:48 AM  ? FREET4 1.09 12/28/2019 09:32 AM  ? FREET4 1.05 10/18/2007 08:24 PM  ? ? ? ?  Latest Ref Rng & Units 07/07/2021  ?  9:38 AM 03/26/2021  ?  8:42 AM 08/05/2020  ?  8:26 AM  ?CBC  ?WBC 4.0 - 10.5 K/uL 9.9   7.6   11.7    ?Hemoglobin 12.0 - 15.0 g/dL 9.7   9.7   9.7    ?Hematocrit 36.0 - 46.0 % 31.8   31.0   30.6    ?Platelets 150.0 - 400.0 K/uL 375.0   304.0   313.0    ? ? ?Lab Results  ?Component Value Date/Time  ? VD25OH 29.0 (L) 12/28/2019 09:32 AM  ? ? ?Clinical ASCVD: No  ?The  10-year ASCVD risk score (Arnett DK, et al., 2019) is: 33.5% ?  Values used to calculate the score: ?    Age: 38 years ?    Sex: Female ?    Is Non-Hispanic African American: Yes ?    Diabetic: Yes ?    Tobacco smoker: No ?    Systolic Blood Pressure: 691 mmHg ?    Is BP treated: Yes ?    HDL Cholesterol: 44.9 mg/dL ?    Total Cholesterol: 195 mg/dL   ? ? ?  06/04/2021  ? 11:46 AM 03/06/2021  ? 11:00 AM 12/27/2019  ? 10:36 AM  ?Depression screen PHQ 2/9  ?Decreased  Interest 0 0 3  ?Down, Depressed, Hopeless 0 1 3  ?PHQ - 2 Score 0 1 6  ?Altered sleeping  1 0  ?Tired, decreased energy  0 3  ?Change in appetite  0 1  ?Feeling bad or failure about yourself   0 2  ?Trouble concentrating  0 1  ?Moving slowly or fidgety/restless  0 2  ?Suicidal thoughts  0 1  ?PHQ-9 Score  2 16  ?Difficult doing work/chores  Not difficult at all Somewhat difficult  ?  ? ?Social History  ? ?Tobacco Use  ?Smoking Status Never  ?Smokeless Tobacco Never  ? ?BP Readings from Last 3 Encounters:  ?07/07/21 (!) 142/90  ?04/20/21 (!) 164/103  ?03/26/21 140/90  ? ?Pulse Readings from Last 3 Encounters:  ?07/07/21 100  ?04/20/21 97  ?03/26/21 90  ? ?Wt Readings from Last 3 Encounters:  ?07/07/21 217 lb (98.4 kg)  ?03/26/21 221 lb (100.2 kg)  ?11/20/20 216 lb 12.8 oz (98.3 kg)  ? ?BMI Readings from Last 3 Encounters:  ?07/07/21 41.00 kg/m?  ?03/26/21 41.76 kg/m?  ?11/20/20 40.96 kg/m?  ? ? ?Assessment/Interventions: Review of patient past medical history, allergies, medications, health status, including review of consultants reports, laboratory and other test data, was performed as part of comprehensive evaluation and provision of chronic care management services.  ? ?SDOH:  (Social Determinants of Health) assessments and interventions performed: Yes ?SDOH Interventions   ? ?Flowsheet Row Most Recent Value  ?SDOH Interventions   ?Financial Strain Interventions Intervention Not Indicated  ?Transportation Interventions Intervention Not Indicated  ? ?   ? ?SDOH Screenings  ? ?Alcohol Screen: Low Risk   ? Last Alcohol Screening Score (AUDIT): 0  ?Depression (PHQ2-9): Low Risk   ? PHQ-2 Score: 0  ?Financial Resource Strain: Low Risk   ? Difficulty of Paying Living Ex

## 2021-09-18 DIAGNOSIS — I10 Essential (primary) hypertension: Secondary | ICD-10-CM

## 2021-09-18 DIAGNOSIS — J4520 Mild intermittent asthma, uncomplicated: Secondary | ICD-10-CM | POA: Diagnosis not present

## 2021-09-18 DIAGNOSIS — E1169 Type 2 diabetes mellitus with other specified complication: Secondary | ICD-10-CM | POA: Diagnosis not present

## 2021-09-18 DIAGNOSIS — E785 Hyperlipidemia, unspecified: Secondary | ICD-10-CM

## 2021-09-18 NOTE — Patient Instructions (Signed)
Visit Information ?It was great speaking with you today!  Please let me know if you have any questions about our visit. ? ? Goals Addressed   ? ?  ?  ?  ?  ? This Visit's Progress  ?  Track and Manage My Blood Pressure-Hypertension     ?  Timeframe:  Long-Range Goal ?Priority:  High ?Start Date: 09/18/21                            ?Expected End Date: 09/19/22                     ? ?Follow Up within 90 days ?  ?- check blood pressure weekly  ?  ?Why is this important?   ?You won't feel high blood pressure, but it can still hurt your blood vessels.  ?High blood pressure can cause heart or kidney problems. It can also cause a stroke.  ?Making lifestyle changes like losing a little weight or eating less salt will help.  ?Checking your blood pressure at home and at different times of the day can help to control blood pressure.  ?If the doctor prescribes medicine remember to take it the way the doctor ordered.  ?Call the office if you cannot afford the medicine or if there are questions about it.   ?  ?Notes:  ?  ? ?  ? ? ?Patient Care Plan: General Pharmacy (Adult)  ?  ? ?Problem Identified: Hypertension, Hyperlipidemia, Diabetes, Asthma, Osteoarthritis, and Gout   ?Priority: High  ?  ? ?Long-Range Goal: Patient-Specific Goal   ?Start Date: 09/18/2021  ?Expected End Date: 09/19/2022  ?This Visit's Progress: On track  ?Priority: High  ?Note:   ?Current Barriers:  ?Suboptimal therapeutic regimen for cholesterol ? ?Pharmacist Clinical Goal(s):  ?Patient will maintain control of blood pressure as evidenced by BP less than 140/90  through collaboration with PharmD and provider.  ? ?Interventions: ?1:1 collaboration with [name], [name], NP regarding development and update of comprehensive plan of care as evidenced by provider attestation and co-signature ?Inter-disciplinary care team collaboration (see longitudinal plan of care) ?Comprehensive medication review performed; medication list updated in electronic medical  record ? ?Hypertension (BP goal <140/90) ?-Controlled ?-Current treatment: ?Lisinopril 40 mg daily:Appropriate, Effective, Safe, Accessible ?-Medications previously tried: Amlodipine, HCTZ,  ?-Current home readings: 126/86 ?-Current dietary habits: seasons with Mrs. Dash salt-free seasoning ? -Breakfast: Oatmeal OR wheat toast + butter, boiled egg, cup of decaf coffee ? -Lunch: Sandwich (wheat + deli meat)  ? - Supper: Spinach OR broccoli, chicken OR fish, sweet potato  ? -Drinks: water  ?-Current exercise habits: walking 3 times weekly (5-10 minutes)  ?-Denies hypotensive/hypertensive symptoms ?-Patient reports polyuria, but does drink 8+ glasses of water daily due to her gout.  ?-Patient set goal to increase activity that is not strenuous on her knees (stretches, chair/arm exercises) 1-2 times weekly.  ?-Recommended to continue current medication ? ?Hyperlipidemia: (LDL goal < 70) ?-Uncontrolled ?-Current treatment: ?Garlic supplement: :Appropriate, Query effective ?-Medications previously tried: Atorvastatin (dizziness),  ?-Discussed importance of statins in reducing cardiovascular risk. Patient would consider it, but wants to wait until after updated lab work.  ?-Recommend rechecking fasting lipid panel  ?-Recommend starting rosuvastatin 5 mg daily.  ? ?Diabetes (A1c goal <7%) ?-Controlled ?-Current medications: ?None ?-Medications previously tried: Metformin ?-Counseled on diet and exercise extensively ? ?Asthma (Goal: control symptoms and prevent exacerbations) ?-Not ideally controlled ?-Current treatment  ?Albuterol 1-2  puffs every 6 hours as needed: Query appropriate ?Montelukast 10 mg daily:Appropriate, Effective, Safe, Accessible  ?-Medications previously tried: NA  ?-Exacerbations requiring treatment in last 6 months: None ?-Frequency of rescue inhaler use: twice monthly  ?-Discussed with patient updated GINA recommendations supporting use of low-dose, as needed ICS-Formoterol combination  ?-Recommend  STOPPING albuterol ?-Recommend STARTING Symbicort 80-4.5 mcg 1-2 puffs twice daily as needed for shortness of breath ? ?Gout (Goal: Prevent gout flares) ?-Controlled ?-Last Gout Flare: Jan 2023 ?-Current treatment  ?None ?-Medications previously tried: Allopurinol (lightheadedness)   ?-We discussed:  Counseled patient on low purine diet plan. Counseled patient to reduce consumption of high-fructose corn syrup, sweetened soft drinks, fruit juices, meat, and seafood. ?-Counseled on diet and exercise extensively ? ?Chronic Kidney Disease Stage 3a  ?-All medications assessed for renal dosing and appropriateness in chronic kidney disease. ?-Recommended to continue current medication ? ?Patient Goals/Self-Care Activities ?Patient will:  ?- check blood pressure weekly, document, and provide at future appointments ? ?Follow Up Plan: Telephone follow up appointment with care management team member scheduled for:  10/29/2021 at 3:00 PM ?  ? ? ? ?Patient agreed to services and verbal consent obtained.  ? ?Patient verbalizes understanding of instructions and care plan provided today and agrees to view in [place]. Active MyChart status confirmed with patient.   ? ?Junius Argyle, PharmD, BCACP, CPP ?Clinical Pharmacist Practitioner  ?[facility]  ?[phone]  ?

## 2021-10-27 ENCOUNTER — Encounter: Payer: Self-pay | Admitting: Orthopaedic Surgery

## 2021-10-27 ENCOUNTER — Ambulatory Visit (INDEPENDENT_AMBULATORY_CARE_PROVIDER_SITE_OTHER): Payer: Medicare Other | Admitting: Orthopaedic Surgery

## 2021-10-27 ENCOUNTER — Ambulatory Visit (INDEPENDENT_AMBULATORY_CARE_PROVIDER_SITE_OTHER): Payer: Medicare Other

## 2021-10-27 VITALS — Ht 60.5 in | Wt 213.0 lb

## 2021-10-27 DIAGNOSIS — M1711 Unilateral primary osteoarthritis, right knee: Secondary | ICD-10-CM | POA: Diagnosis not present

## 2021-10-27 NOTE — Progress Notes (Addendum)
? ?Office Visit Note ?  ?Patient: Penny Gibson           ?Date of Birth: 1946-06-01           ?MRN: [id number] ?Visit Date: 10/27/2021 ?             ?Requested by: Flossie Buffy, NP ?[address] ?[place],  [place] [address] ?PCP: Flossie Buffy, NP ? ? ?Assessment & Plan: ?Visit Diagnoses:  ?1. Primary osteoarthritis of right knee   ? ? ?Plan: Impression is advanced right knee DJD with varus deformity and bone-on-bone medial compartment joint space narrowing.  These findings were reviewed with Penny Gibson in detail.  Again we talked about treatment options and based on her options she has elected to move forward with a right total knee replacement.  She does understand that her BMI will have to be 40 or less in order for her insurance to approve the surgery but she is not planning to have the surgery until June or July which gives her adequate time to lose the necessary weight.  In the meantime we will get preoperative medical clearance from PCP.  Risk benefits rehab recovery prognosis again reviewed with the patient.  All questions answered. ? ?Follow-Up Instructions: No follow-ups on file.  ? ?Orders:  ?Orders Placed This Encounter  ?Procedures  ?? XR KNEE 3 VIEW RIGHT  ? ?No orders of the defined types were placed in this encounter. ? ? ? ? Procedures: ?No procedures performed ? ? ?Clinical Data: ?No additional findings. ? ? ?Subjective: ?Chief Complaint  ?Patient presents with  ?? Right Knee - Pain  ? ? ?HPI ? ?Penny Gibson is a 76 year old female well-known to me who comes in for evaluation of chronic right knee pain.  She is 20 status post left total knee replacement from which she has done very well.  She is very happy.  She has been struggling with right knee pain for the last year.  Previous cortisone injections have not been effective.  Currently takes Tylenol to help ease the pain.  Well-controlled borderline diabetic with most recent A1c of 6.5. ? ?Review of Systems  ?Constitutional:  Negative.   ?HENT: Negative.    ?Eyes: Negative.   ?Respiratory: Negative.    ?Cardiovascular: Negative.   ?Endocrine: Negative.   ?Musculoskeletal: Negative.   ?Neurological: Negative.   ?Hematological: Negative.   ?Psychiatric/Behavioral: Negative.    ?All other systems reviewed and are negative. ? ? ?Objective: ?Vital Signs: Ht 5' 0.5" (1.537 m)   Wt 213 lb (96.6 kg)   BMI 40.91 kg/m?  ? ?Physical Exam ?Vitals and nursing note reviewed.  ?Constitutional:   ?   Appearance: She is well-developed.  ?HENT:  ?   Head: Normocephalic and atraumatic.  ?Pulmonary:  ?   Effort: Pulmonary effort is normal.  ?Abdominal:  ?   Palpations: Abdomen is soft.  ?Musculoskeletal:  ?   Cervical back: Neck supple.  ?Skin: ?   General: Skin is warm.  ?   Capillary Refill: Capillary refill takes less than 2 seconds.  ?Neurological:  ?   Mental Status: She is alert and oriented to person, place, and time.  ?Psychiatric:     ?   Behavior: Behavior normal.     ?   Thought Content: Thought content normal.     ?   Judgment: Judgment normal.  ? ? ?Ortho Exam ? ?Examination of the right knee shows pain and crepitus with range of motion past 90 degrees.  Causing cruciates are stable.  Trace effusion.  No joint tenderness. ? ?Specialty Comments:  ?No specialty comments available. ? ?Imaging: ?XR KNEE 3 VIEW RIGHT ? ?Result Date: 10/27/2021 ?Advanced tricompartmental degenerative joint disease.  Bone-on-bone joint space narrowing.  ? ? ?PMFS History: ?Patient Active Problem List  ? Diagnosis Date Noted  ?? Primary osteoarthritis of right knee 10/27/2021  ?? Pain due to onychomycosis of toenails of both feet 04/20/2021  ?? Hallux limitus 04/20/2021  ?? Gouty arthritis of right great toe 03/26/2021  ?? Malignant neoplasm of upper-outer quadrant of right breast in female, estrogen receptor positive ([place]) 08/27/2020  ?? Diabetes mellitus ([place]) 07/18/2020  ?? Vitamin D deficiency 07/18/2020  ?? Hyperlipidemia associated with type 2 diabetes mellitus  ([place]) 07/18/2020  ?? Status post total left knee replacement 03/10/2020  ?? Cardiomegaly 06/27/2018  ?? Iron deficiency anemia 07/19/2017  ?? Primary osteoarthritis of left knee 12/31/2016  ?? Asthma, mild intermittent 05/28/2016  ?? Other seasonal allergic rhinitis 07/26/2014  ?? Atypical squamous cells of undetermined significance (ASCUS) on Papanicolaou smear of cervix 01/31/2014  ?? Essential hypertension 09/19/2013  ?? Generalized osteoarthritis of multiple sites   ? ?Past Medical History:  ?Diagnosis Date  ?? Anemia   ?? Arthritis   ?? Asthma   ?? Constipation   ?? Diabetes mellitus without complication ([place])   ?? Dyspnea   ?? Hyperlipidemia   ?? Hypertension   ?? Insomnia   ?? Joint pain   ?? Knee pain   ?? Lactose intolerance   ?? Lower extremity edema   ?? Multiple food allergies   ?? Osteoarthritis   ?? Sinus problem   ?  ?Family History  ?Problem Relation Age of Onset  ?? Heart attack Mother   ?? Heart disease Mother   ?? Sudden death Mother   ?? Obesity Mother   ?? Cancer Sister   ?? Breast cancer Sister 60  ?? Stroke Maternal Grandmother   ?? Sudden death Father   ?  ?Past Surgical History:  ?Procedure Laterality Date  ?? BREAST BIOPSY Left 2010  ?? BREAST LUMPECTOMY Left 2010  ?? BREAST LUMPECTOMY WITH RADIOACTIVE SEED LOCALIZATION Right 09/19/2020  ? Procedure: RIGHT BREAST LUMPECTOMY WITH RADIOACTIVE SEED LOCALIZATION;  Surgeon: [name], MD;  Location: [facility];  Service: General;  Laterality: Right;  ?? BREAST SURGERY Right 09/19/2020  ?? EYE SURGERY Bilateral   ? cataract surgery  ?? REPLACEMENT TOTAL KNEE  03/10/2020  ?? TOTAL KNEE ARTHROPLASTY Left 03/10/2020  ? Procedure: LEFT TOTAL KNEE ARTHROPLASTY;  Surgeon: Leandrew Koyanagi, MD;  Location: [facility];  Service: Orthopedics;  Laterality: Left;  ?? TUBAL LIGATION    ? ?Social History  ? ?Occupational History  ?? Not on file  ?Tobacco Use  ?? Smoking status: Never  ?? Smokeless tobacco: Never  ?Vaping Use  ??  Vaping Use: Never used  ?Substance and Sexual Activity  ?? Alcohol use: No  ?? Drug use: No  ?? Sexual activity: Not Currently  ?  Birth control/protection: Surgical  ? ? ? ? ? ? ?

## 2021-10-28 ENCOUNTER — Telehealth: Payer: Self-pay

## 2021-10-28 NOTE — Progress Notes (Signed)
Chronic Care Management APPOINTMENT REMINDER  Penny Gibson was reminded to have all medications, supplements and any blood glucose and blood pressure readings available for review with Junius Argyle, Pharm. D, at her telephone visit on 10/29/2021  at 3:00 pm.  Patient confirm appointment.  [place] Pharmacist Assistant [phone]

## 2021-10-29 ENCOUNTER — Telehealth: Payer: Medicare Other

## 2021-10-29 NOTE — Progress Notes (Deleted)
Chronic Care Management Pharmacy Note  10/29/2021 Name:  Penny Gibson MRN:  [id number] DOB:  [birth year]  Summary: Patient presents for initial CCM consult.   -Patient reports polyuria, but does drink 8+ glasses of water daily due to her gout.  -Discussed importance of statins in reducing cardiovascular risk. Patient would consider it, but wants to wait until after updated lab work.   -Discussed with patient updated GINA recommendations supporting use of low-dose, as needed ICS-Formoterol combination.   She was considering upstream pharmacy to help with medication adherence, but will wait until next visit to discuss further.   Recommendations/Changes made from today's visit: -Recommend rechecking fasting lipid panel  -Recommend starting rosuvastatin 5 mg daily if patient amenable.   -Recommend STOPPING albuterol -Recommend STARTING Symbicort 80-4.5 mcg 1-2 puffs twice daily as needed for shortness of breath  -Patient set goal to increase activity that is not strenuous on her knees (stretches, chair/arm exercises) 1-2 times weekly.   Plan: CPP follow-up 2 months  Recommended Problem List Changes:  Add: Chronic Kidney Disease Stage 3a   Subjective: Penny Gibson is an 76 y.o. year old female who is a primary patient of Penny, [name], NP.  The CCM team was consulted for assistance with disease management and care coordination needs.    Engaged with patient by telephone for initial visit in response to provider referral for pharmacy case management and/or care coordination services.   Consent to Services:  The patient was given the following information about Chronic Care Management services today, agreed to services, and gave verbal consent: 1. CCM service includes personalized support from designated clinical staff supervised by the primary care provider, including individualized plan of care and coordination with other care providers 2. 24/7 contact phone numbers for  assistance for urgent and routine care needs. 3. Service will only be billed when office clinical staff spend 20 minutes or more in a month to coordinate care. 4. Only one practitioner may furnish and bill the service in a calendar month. 5.The patient may stop CCM services at any time (effective at the end of the month) by phone call to the office staff. 6. The patient will be responsible for cost sharing (co-pay) of up to 20% of the service fee (after annual deductible is met). Patient agreed to services and consent obtained.  Patient Care Team: Penny, [name], NP as PCP - General (Internal Medicine) [name], MD as Consulting Physician (Hematology) [name], MD as Consulting Physician (General Surgery) [name], MD as Consulting Physician (Radiation Oncology) [name], [name], NP as Nurse Practitioner (Hematology and Oncology) [name], RN as Registered Nurse [name], [facility] (Pharmacist)  Recent office visits: 07/14/2021 [name] NP (PCP) Stop allopurinol 07/07/2021 [name] NP (PCP) Start allopurinol 188m daily, per chart note patient stop taking Atorvastatin, AMB Referral to [id number] Return in about 3 months  03/26/2021 CWilfred LacyNP (PCP) start naproxen 500 mg PRN, start Montelukast 10 mg daily, start Flonase 50 MCG/ACT 2 sprays daily, stop Allegra , stop Zyrtec, Ambulatory referral to [facility] [place] about 3 months  03/06/2021 DKeene(PCP Office) Medicare Wellness completed   Recent consult visits: 07/27/2021 Dr. SBlenda MountsMD (Podiatry) Dexamethasone Sodium Phosphate 4 mg injection given, Return in about 3 weeks  06/04/2021 NRea CollegeRN (Oncology) No Medication Changes noted 05/20/2021 KBoneta LucksDPM (Podiatry)  start Diclofenac Sodium 75 mg 2 times daily, Steroid injection given - Kenalog 10 mg 04/29/2021 KLennette Bihari  Patel DPM (Podiatry) Start Ammonium Lactate 12% PRN  04/20/2021 [name] DPM  (Podiatry) No Medication Changes noted  Hospital visits: None in previous 6 months   Objective:  Lab Results  Component Value Date   CREATININE 1.05 07/07/2021   BUN 14 07/07/2021   GFR 52.47 (L) 07/07/2021   GFRNONAA 15 (L) 03/14/2020   GFRAA 17 (L) 03/14/2020   NA 139 07/07/2021   K 4.4 07/07/2021   CALCIUM 9.3 07/07/2021   CO2 29 07/07/2021   GLUCOSE 104 (H) 07/07/2021    Lab Results  Component Value Date/Time   HGBA1C 6.5 09/16/2021 12:00 AM   HGBA1C 6.8 (H) 07/07/2021 09:38 AM   HGBA1C 6.5 03/26/2021 08:42 AM   GFR 52.47 (L) 07/07/2021 09:38 AM   GFR 49.72 (L) 03/26/2021 08:42 AM   MICROALBUR 0.7 08/05/2020 08:26 AM    Last diabetic Eye exam:  Lab Results  Component Value Date/Time   HMDIABEYEEXA No Retinopathy 07/21/2021 12:00 AM    Last diabetic Foot exam: No results found for: HMDIABFOOTEX   Lab Results  Component Value Date   CHOL 195 03/26/2021   HDL 44.90 03/26/2021   LDLCALC 112 (H) 03/26/2021   TRIG 189.0 (H) 03/26/2021   CHOLHDL 4 03/26/2021       Latest Ref Rng & Units 08/05/2020    8:26 AM 03/14/2020    3:05 PM 03/07/2020   10:00 AM  Hepatic Function  Total Protein 6.0 - 8.3 g/dL 8.4   7.6   8.6    Albumin 3.5 - 5.2 g/dL 4.1   3.2   4.0    AST 0 - 37 U/L 12   16   16     ALT 0 - 35 U/L 9   14   13     Alk Phosphatase 39 - 117 U/L 90   62   77    Total Bilirubin 0.2 - 1.2 mg/dL 0.3   0.6   0.3      Lab Results  Component Value Date/Time   TSH 1.750 12/28/2019 09:32 AM   TSH 1.27 03/13/2018 09:48 AM   FREET4 1.09 12/28/2019 09:32 AM   FREET4 1.05 10/18/2007 08:24 PM       Latest Ref Rng & Units 07/07/2021    9:38 AM 03/26/2021    8:42 AM 08/05/2020    8:26 AM  CBC  WBC 4.0 - 10.5 K/uL 9.9   7.6   11.7    Hemoglobin 12.0 - 15.0 g/dL 9.7   9.7   9.7    Hematocrit 36.0 - 46.0 % 31.8   31.0   30.6    Platelets 150.0 - 400.0 K/uL 375.0   304.0   313.0      Lab Results  Component Value Date/Time   VD25OH 29.0 (L) 12/28/2019 09:32 AM     Clinical ASCVD: No  The 10-year ASCVD risk score (Arnett DK, et al., 2019) is: 33.5%   Values used to calculate the score:     Age: 76 years     Sex: Female     Is Non-Hispanic African American: Yes     Diabetic: Yes     Tobacco smoker: No     Systolic Blood Pressure: 009 mmHg     Is BP treated: Yes     HDL Cholesterol: 44.9 mg/dL     Total Cholesterol: 195 mg/dL       06/04/2021   11:46 AM 03/06/2021   11:00 AM 12/27/2019   10:36 AM  Depression screen PHQ 2/9  Decreased Interest 0 0 3  Down, Depressed, Hopeless 0 1 3  PHQ - 2 Score 0 1 6  Altered sleeping  1 0  Tired, decreased energy  0 3  Change in appetite  0 1  Feeling bad or failure about yourself   0 2  Trouble concentrating  0 1  Moving slowly or fidgety/restless  0 2  Suicidal thoughts  0 1  PHQ-9 Score  2 16  Difficult doing work/chores  Not difficult at all Somewhat difficult     Social History   Tobacco Use  Smoking Status Never  Smokeless Tobacco Never   BP Readings from Last 3 Encounters:  07/07/21 (!) 142/90  04/20/21 (!) 164/103  03/26/21 140/90   Pulse Readings from Last 3 Encounters:  07/07/21 100  04/20/21 97  03/26/21 90   Wt Readings from Last 3 Encounters:  10/27/21 213 lb (96.6 kg)  07/07/21 217 lb (98.4 kg)  03/26/21 221 lb (100.2 kg)   BMI Readings from Last 3 Encounters:  10/27/21 40.91 kg/m  07/07/21 41.00 kg/m  03/26/21 41.76 kg/m    Assessment/Interventions: Review of patient past medical history, allergies, medications, health status, including review of consultants reports, laboratory and other test data, was performed as part of comprehensive evaluation and provision of chronic care management services.   SDOH:  (Social Determinants of Health) assessments and interventions performed: Yes   SDOH Screenings   Alcohol Screen: Low Risk    Last Alcohol Screening Score (AUDIT): 0  Depression (PHQ2-9): Low Risk    PHQ-2 Score: 0  Financial Resource Strain: Low Risk     Difficulty of Paying Living Expenses: Not hard at all  Food Insecurity: No Food Insecurity   Worried About Charity fundraiser in the Last Year: Never true   Ran Out of Food in the Last Year: Never true  Housing: Low Risk    Last Housing Risk Score: 0  Physical Activity: Inactive   Days of Exercise per Week: 0 days   Minutes of Exercise per Session: 0 min  Social Connections: Moderately Isolated   Frequency of Communication with Friends and Family: More than three times a week   Frequency of Social Gatherings with Friends and Family: Three times a week   Attends Religious Services: 1 to 4 times per year   Active Member of Clubs or Organizations: No   Attends Archivist Meetings: Never   Marital Status: Divorced  Stress: No Stress Concern Present   Feeling of Stress : Not at all  Tobacco Use: Low Risk    Smoking Tobacco Use: Never   Smokeless Tobacco Use: Never   Passive Exposure: Not on file  Transportation Needs: No Transportation Needs   Lack of Transportation (Medical): No   Lack of Transportation (Non-Medical): No    CCM Care Plan  Allergies  Allergen Reactions   Feraheme [Ferumoxytol] Anaphylaxis   Penicillins Anaphylaxis   Allopurinol Nausea Only   Atorvastatin Other (See Comments)    dizziness   Citric Acid Rash    Medications Reviewed Today     Reviewed by Lendon Collar, RT (Technologist) on 10/27/21 at [phone]  Med List Status: <None>   Medication Order Taking? Sig Documenting Provider Last Dose Status Informant  acetaminophen (TYLENOL) 500 MG tablet [id number] Yes Take 500 mg by mouth every 6 (six) hours as needed. [provider] Taking Active   albuterol (VENTOLIN HFA) 108 (90 Base) MCG/ACT inhaler [id number] Yes INHALE  1 TO 2 PUFFS INTO THE LUNGS EVERY 6 HOURS AS NEEDED FOR WHEEZING OR SHORTNESS OF BREATH Penny, [name], NP Taking Active   ammonium lactate (AMLACTIN) 12 % lotion [id number] Yes Apply 1 application topically as needed  for dry skin. Felipa Furnace, DPM Taking Active   Apoaequorin (PREVAGEN PO) [id number] Yes Take 1 tablet by mouth daily. [provider] Taking Active   aspirin EC 81 MG tablet [id number] Yes Take 81 mg by mouth daily. Swallow whole. [provider] Taking Active   Cholecalciferol (VITAMIN D3) 125 MCG (5000 UT) TABS [id number] Yes Take 1 tablet by mouth daily. [provider] Taking Active   dexamethasone (DECADRON) injection 4 mg [id number]   Lorenda Peck, DPM  Active   diphenhydramine-acetaminophen (TYLENOL PM) 25-500 MG TABS tablet [id number] Yes Take 1 tablet by mouth at bedtime. [provider] Taking Active   fluticasone (FLONASE) 50 MCG/ACT nasal spray [id number] Yes Place 2 sprays into both nostrils daily. Flossie Buffy, NP Taking Active   Garlic (GARLIQUE PO) [id number] Yes Take 1 tablet by mouth daily. [provider] Taking Active   lisinopril (ZESTRIL) 40 MG tablet [id number] Yes Take 1 tablet (40 mg total) by mouth daily. Penny, [name], NP Taking Active   montelukast (SINGULAIR) 10 MG tablet [id number] Yes Take 1 tablet (10 mg total) by mouth at bedtime. Flossie Buffy, NP Taking Active   Turmeric 500 MG CAPS [id number] Yes Take 1,000 mg by mouth daily. [provider] Taking Active   vitamin B-12 (CYANOCOBALAMIN) 1000 MCG tablet [id number] Yes Take 1,000 mcg by mouth daily. [provider] Taking Active             Patient Active Problem List   Diagnosis Date Noted   Primary osteoarthritis of right knee 10/27/2021   Pain due to onychomycosis of toenails of both feet 04/20/2021   Hallux limitus 04/20/2021   Gouty arthritis of right great toe 03/26/2021   Malignant neoplasm of upper-outer quadrant of right breast in female, estrogen receptor positive ([place]) 08/27/2020   Diabetes mellitus ([place]) 07/18/2020   Vitamin D deficiency 07/18/2020   Hyperlipidemia associated with type 2 diabetes mellitus ([place])  07/18/2020   Status post total left knee replacement 03/10/2020   Cardiomegaly 06/27/2018   Iron deficiency anemia 07/19/2017   Primary osteoarthritis of left knee 12/31/2016   Asthma, mild intermittent 05/28/2016   Other seasonal allergic rhinitis 07/26/2014   Atypical squamous cells of undetermined significance (ASCUS) on Papanicolaou smear of cervix 01/31/2014   Essential hypertension 09/19/2013   Generalized osteoarthritis of multiple sites     Immunization History  Administered Date(s) Administered   Fluad Quad(high Dose 65+) 03/26/2021   Influenza, High Dose Seasonal PF 03/19/2016, 04/12/2017, 04/21/2018   Influenza,inj,Quad PF,6+ Mos 07/26/2014   Influenza-Unspecified 03/21/2013, 04/21/2018, 04/01/2020   Moderna Sars-Covid-2 Vaccination 08/04/2019, 09/02/2019, 05/01/2020   Pneumococcal Conjugate-13 07/26/2014   Pneumococcal Polysaccharide-23 09/20/2013   Tdap 06/22/2013    Conditions to be addressed/monitored:  Hypertension, Hyperlipidemia, Diabetes, Asthma, Osteoarthritis, and Gout  There are no care plans that you recently modified to display for this patient.     Medication Assistance: None required.  Patient affirms current coverage meets needs.  Compliance/Adherence/Medication fill history: Care Gaps: Shingrix Vaccine Fecal DNA (Cologuard) COVID-19 Vaccine HTN: 142/90 on 07/07/2021  [facility]: Lisinopril 40 mg last filled on 07/07/2021 for 90 day supply at [facility]  Patient's preferred pharmacy is:  [id number] - [place], [place]  [address] [place] [address] Phone: [phone] Fax: [phone]  Uses pill box? No Pt endorses 80% compliance, does sometimes forgot some medications  We discussed: Current pharmacy is preferred with insurance plan and patient is satisfied with pharmacy services Patient decided to: Continue current medication  management strategy  Care Plan and Follow Up Patient Decision:  Patient agrees to Care Plan and Follow-up.  Plan: Telephone follow up appointment with care management team member scheduled for:  10/29/2021 at 3:00 PM  Junius Argyle, PharmD, Para [place], CPP Clinical Pharmacist Practitioner  [facility]  [phone]  Patient Care Plan: General Pharmacy (Adult)     Problem Identified: Hypertension, Hyperlipidemia, Diabetes, Asthma, Osteoarthritis, and Gout   Priority: High     Long-Range Goal: Patient-Specific Goal   Start Date: 09/18/2021  Expected End Date: 09/19/2022  This Visit's Progress: On track  Priority: High  Note:      Current Barriers:  Suboptimal therapeutic regimen for cholesterol  Pharmacist Clinical Goal(s):  Patient will maintain control of blood pressure as evidenced by BP less than 140/90  through collaboration with PharmD and provider.   Interventions: 1:1 collaboration with Penny, [name], NP regarding development and update of comprehensive plan of care as evidenced by provider attestation and co-signature Inter-disciplinary care team collaboration (see longitudinal plan of care) Comprehensive medication review performed; medication list updated in electronic medical record  Hypertension (BP goal <140/90) -Controlled -Current treatment: Lisinopril 40 mg daily:Appropriate, Effective, Safe, Accessible -Medications previously tried: Amlodipine, HCTZ,  -Current home readings: 126/86 -Current dietary habits: seasons with Mrs. Dash salt-free seasoning  -Breakfast: Oatmeal OR wheat toast + butter, boiled egg, cup of decaf coffee  -Lunch: Sandwich (wheat + deli meat)   - Supper: Spinach OR broccoli, chicken OR fish, sweet potato   -Drinks: water  -Current exercise habits: walking 3 times weekly (5-10 minutes)  -Denies hypotensive/hypertensive symptoms -Patient reports polyuria, but does drink 8+ glasses of water daily due to her  gout.  -Patient set goal to increase activity that is not strenuous on her knees (stretches, chair/arm exercises) 1-2 times weekly.  -Recommended to continue current medication  Hyperlipidemia: (LDL goal < 70) -Uncontrolled -Current treatment: Garlic supplement: :Appropriate, Query effective -Medications previously tried: Atorvastatin (dizziness),  -Discussed importance of statins in reducing cardiovascular risk. Patient would consider it, but wants to wait until after updated lab work.  -Recommend rechecking fasting lipid panel  -Recommend starting rosuvastatin 5 mg daily.   Diabetes (A1c goal <7%) -Controlled -Current medications: None -Medications previously tried: Metformin -Counseled on diet and exercise extensively  Asthma (Goal: control symptoms and prevent exacerbations) -Not ideally controlled -Current treatment  Albuterol 1-2 puffs every 6 hours as needed: Query appropriate Montelukast 10 mg daily:Appropriate, Effective, Safe, Accessible  -Medications previously tried: NA  -Exacerbations requiring treatment in last 6 months: None -Frequency of rescue inhaler use: twice monthly  -Discussed with patient updated GINA recommendations supporting use of low-dose, as needed ICS-Formoterol combination  -Recommend STOPPING albuterol -Recommend STARTING Symbicort 80-4.5 mcg 1-2 puffs twice daily as needed for shortness of breath  Gout (Goal: Prevent gout flares) -Controlled -Last Gout Flare: Jan 2023 -Current treatment  None -Medications previously tried: Allopurinol (lightheadedness)   -We discussed:  Counseled patient on low purine diet plan. Counseled patient to reduce consumption of high-fructose corn syrup, sweetened soft drinks, fruit juices, meat, and seafood. -Counseled on diet and exercise extensively  Chronic Kidney Disease Stage 3a  -  All medications assessed for renal dosing and appropriateness in chronic kidney disease. -Recommended to continue current  medication  Patient Goals/Self-Care Activities Patient will:  - check blood pressure weekly, document, and provide at future appointments  Follow Up Plan: Telephone follow up appointment with care management team member scheduled for:  10/29/2021 at 3:00 PM

## 2021-11-05 ENCOUNTER — Telehealth: Payer: Self-pay | Admitting: Nurse Practitioner

## 2021-11-05 NOTE — Telephone Encounter (Signed)
Left message for patient to call back and schedule Medicare Annual Wellness Visit (AWV).   Please offer to do virtually or by telephone.  Left office number and my jabber #[phone].  Last AWV:08/15/2019  Please schedule at anytime with Nurse Health Advisor.   

## 2021-11-09 IMAGING — MG MM BREAST BX W/ LOC DEV 1ST LESION IMAGE BX SPEC STEREO GUIDE*R*
8 of 10 series · 8 of 22 positions shown · non-contrast
Comparison: Previous exams.
COMPARISON: Previous exams.

Addendum:
CLINICAL DATA: 74-year-old female with an indeterminate 0.5 cm
group of calcifications in the upper-outer right breast.

EXAM:
BREAST STEREOTACTIC CORE NEEDLE BIOPSY

[R (1 of 6)]
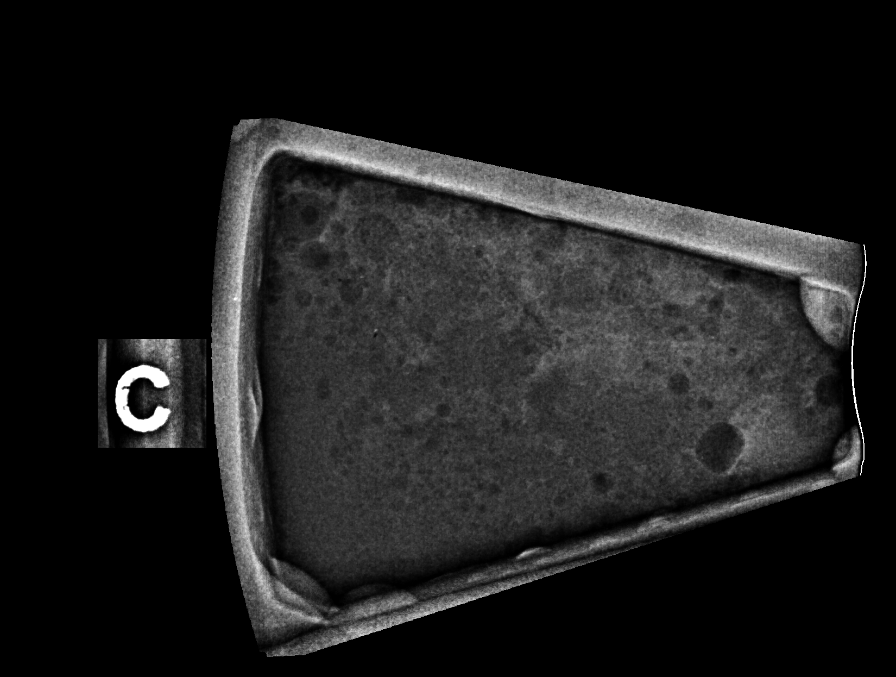

[R (2 of 6)]
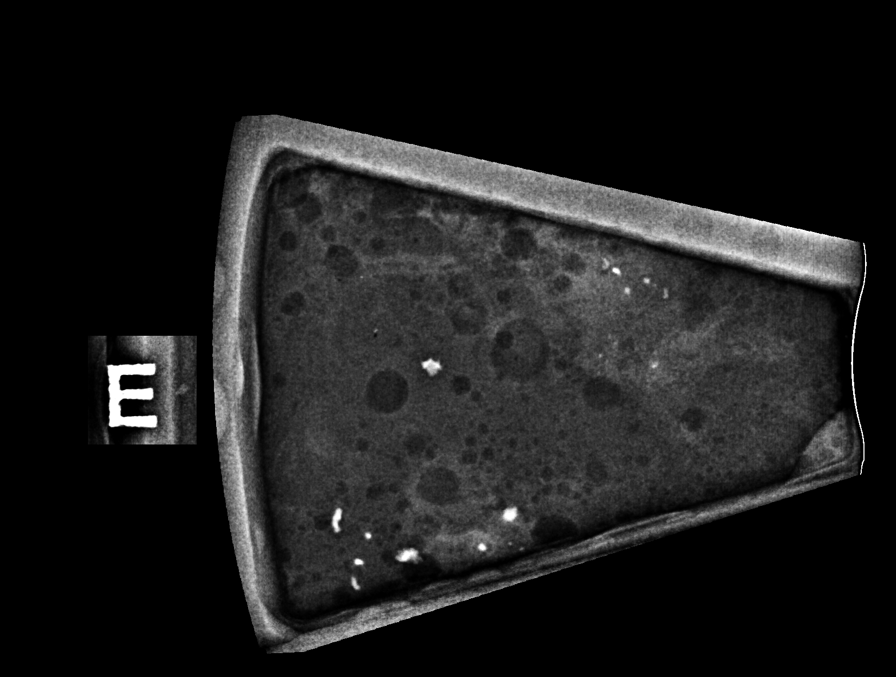

[R (3 of 6)]
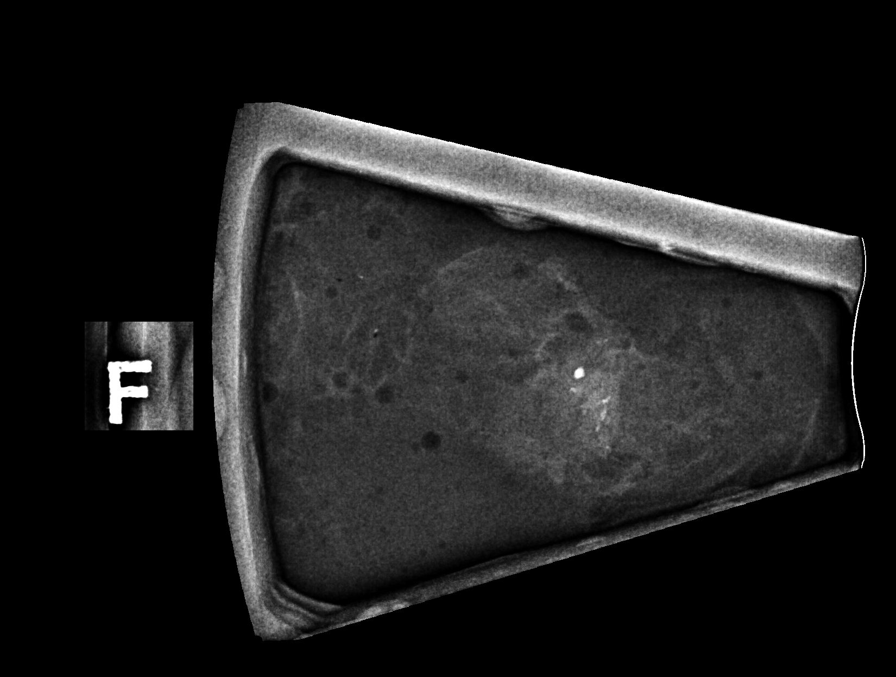

[R (4 of 6)]
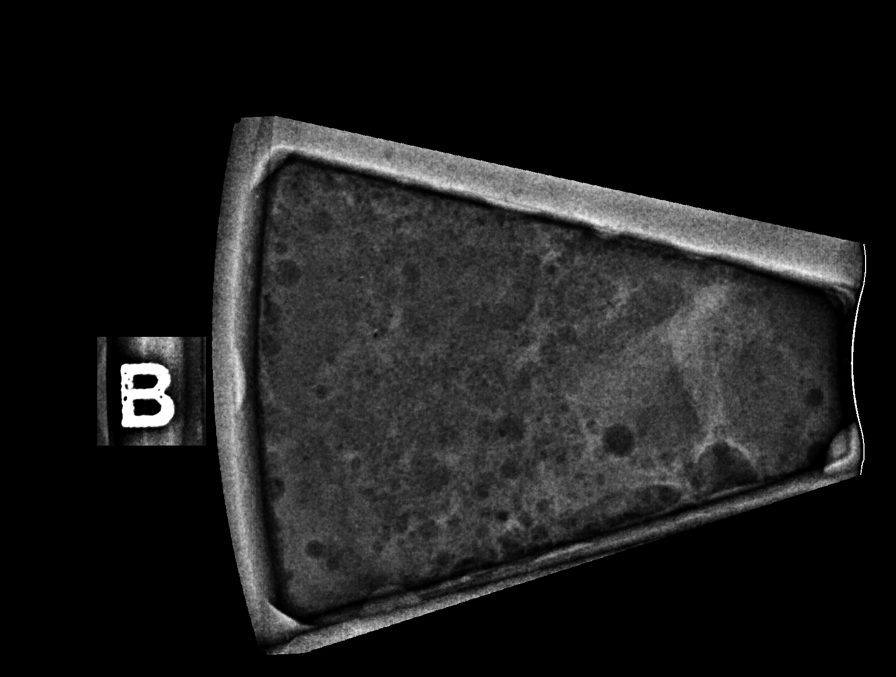

[R (5 of 6)]
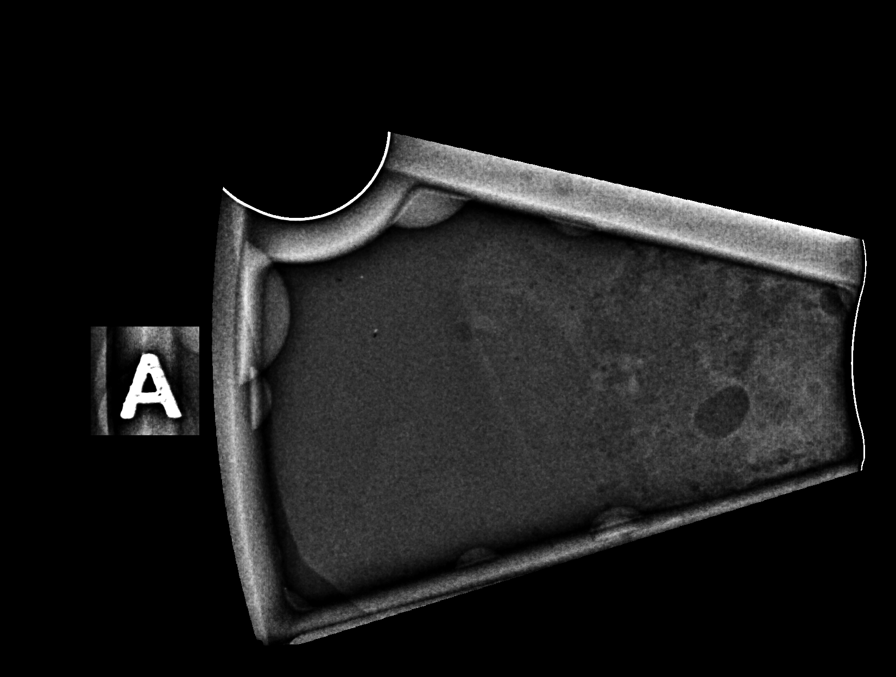

[R (6 of 6)]
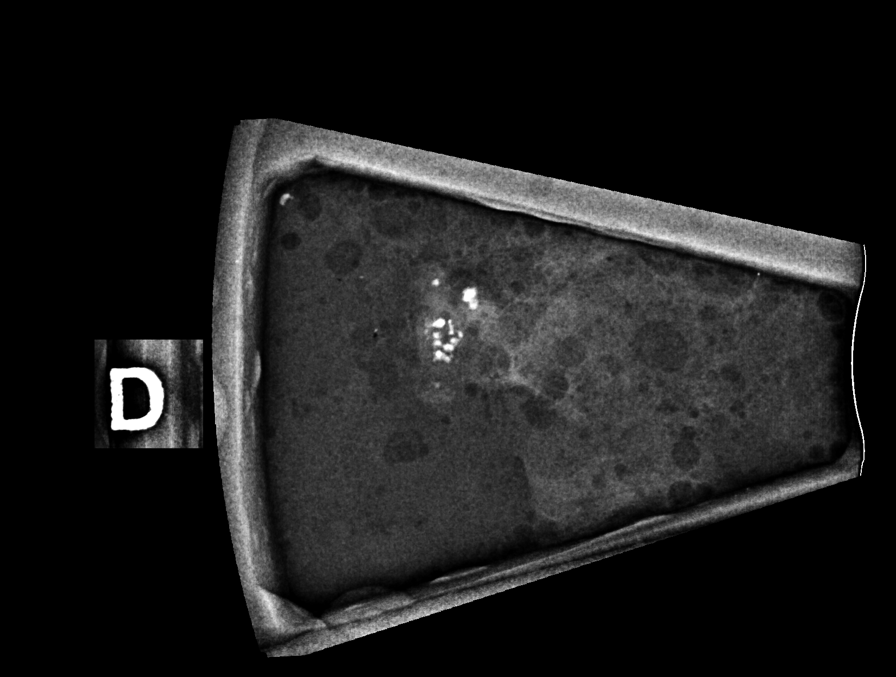

[R LM]
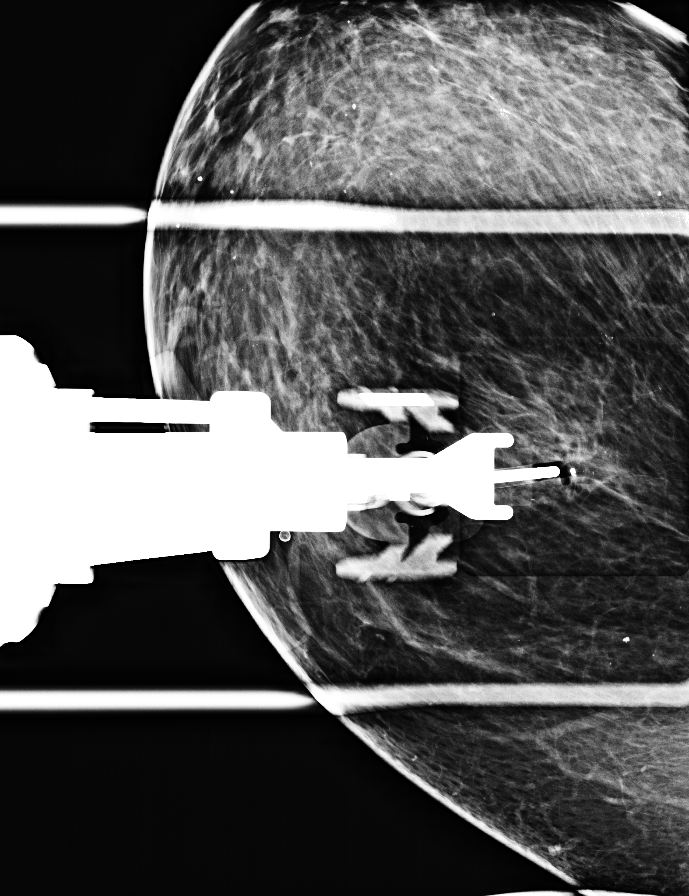

[R LM tomo · tomo slice 35/69.0]
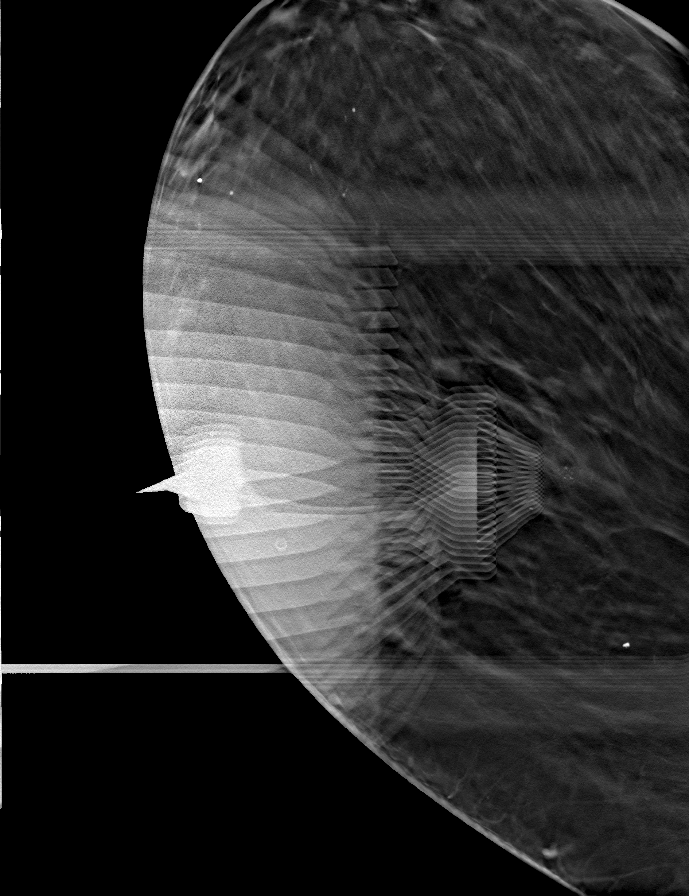

[8 of 22 positions shown; findings below may reference images not displayed]



Using sterile technique and 1% Lidocaine as local anesthetic, under
stereotactic guidance, a 9 gauge vacuum assisted device was used to
perform core needle biopsy of the calcifications in the upper-outer
right breast using a lateral to medial approach. Specimen radiograph
was performed showing the presence of calcifications. Specimens with
calcifications are identified for pathology.

Lesion quadrant: Upper-outer

At the conclusion of the procedure, a coil shaped tissue marker clip
was deployed into the biopsy cavity. Follow-up 2-view mammogram was
performed and dictated separately.
IMPRESSION: Stereotactic-guided biopsy of the calcifications in the upper-outer
right breast. No apparent complications.

ADDENDUM:
Pathology revealed HIGH GRADE DUCTAL CARCINOMA IN SITU WITH
CALCIFICATIONS AND NECROSIS, SCLEROSING ADENOSIS WITH CALCIFICATIONS
of the RIGHT breast, upper outer quadrant. This was found to be
concordant by Dr. Danaika Glielmi.

Pathology results were discussed with the patient by telephone. The
patient reported doing well after the biopsy with tenderness at the
site. Post biopsy instructions and care were reviewed and questions
were answered. The patient was encouraged to call The [REDACTED]

Surgical consultation has been arranged with Dr. Blondinacka Samsonaite at
[REDACTED] on August 26, 2020.

Consider bilateral breast MRI given high grade histology.

Pathology results reported by Maddox Rivero RN on 08/19/2020.



Using sterile technique and 1% Lidocaine as local anesthetic, under
stereotactic guidance, a 9 gauge vacuum assisted device was used to
perform core needle biopsy of the calcifications in the upper-outer
right breast using a lateral to medial approach. Specimen radiograph
was performed showing the presence of calcifications. Specimens with
calcifications are identified for pathology.

Lesion quadrant: Upper-outer

At the conclusion of the procedure, a coil shaped tissue marker clip
was deployed into the biopsy cavity. Follow-up 2-view mammogram was
performed and dictated separately.
IMPRESSION: Stereotactic-guided biopsy of the calcifications in the upper-outer
right breast. No apparent complications.

## 2021-11-09 IMAGING — MG MM BREAST LOCALIZATION CLIP
4 series · 4 of 12 positions shown · non-contrast
Comparison: Previous exams.

CLINICAL DATA: Post stereotactic guided biopsy of a suspicious
cm group of calcifications in the upper-outer right breast.

EXAM:
DIAGNOSTIC RIGHT MAMMOGRAM POST STEREOTACTIC BIOPSY

[R LM synth-2D]
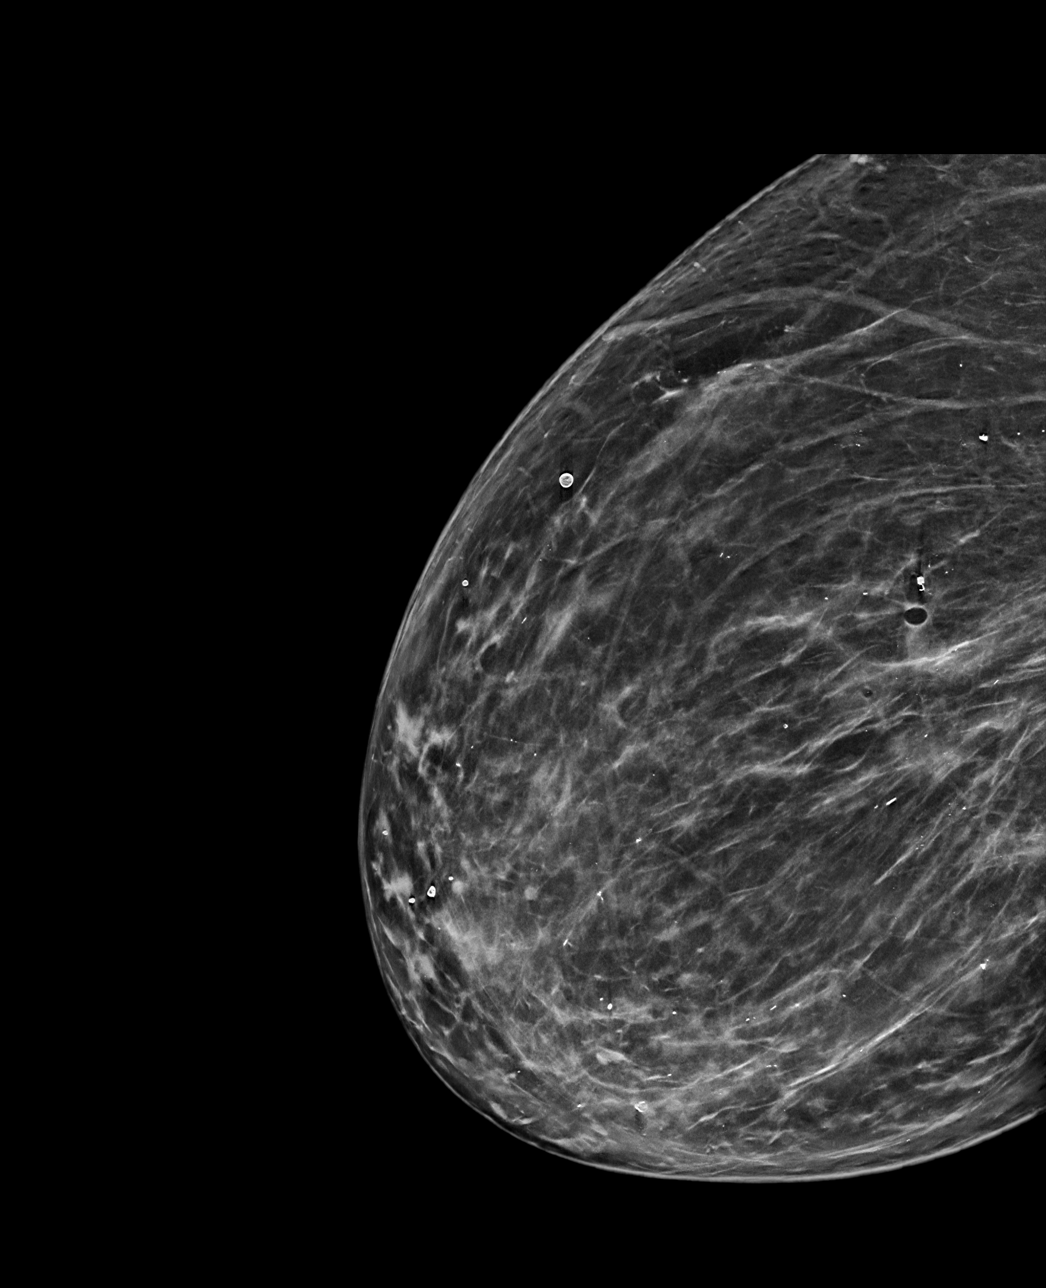

[R CC synth-2D]
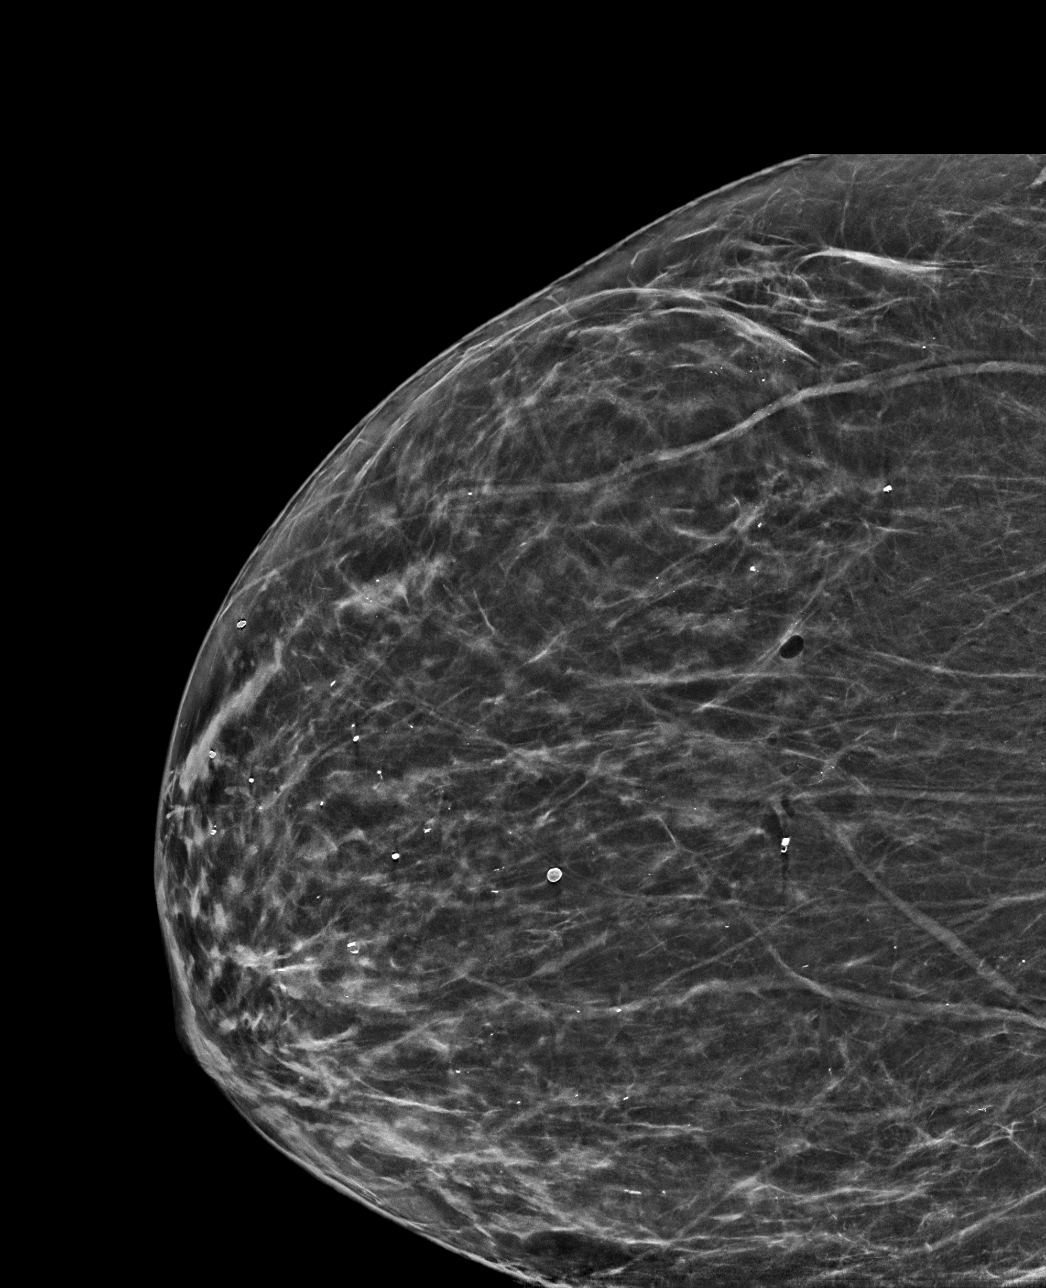

[R LM tomo · tomo slice 40/79.0]
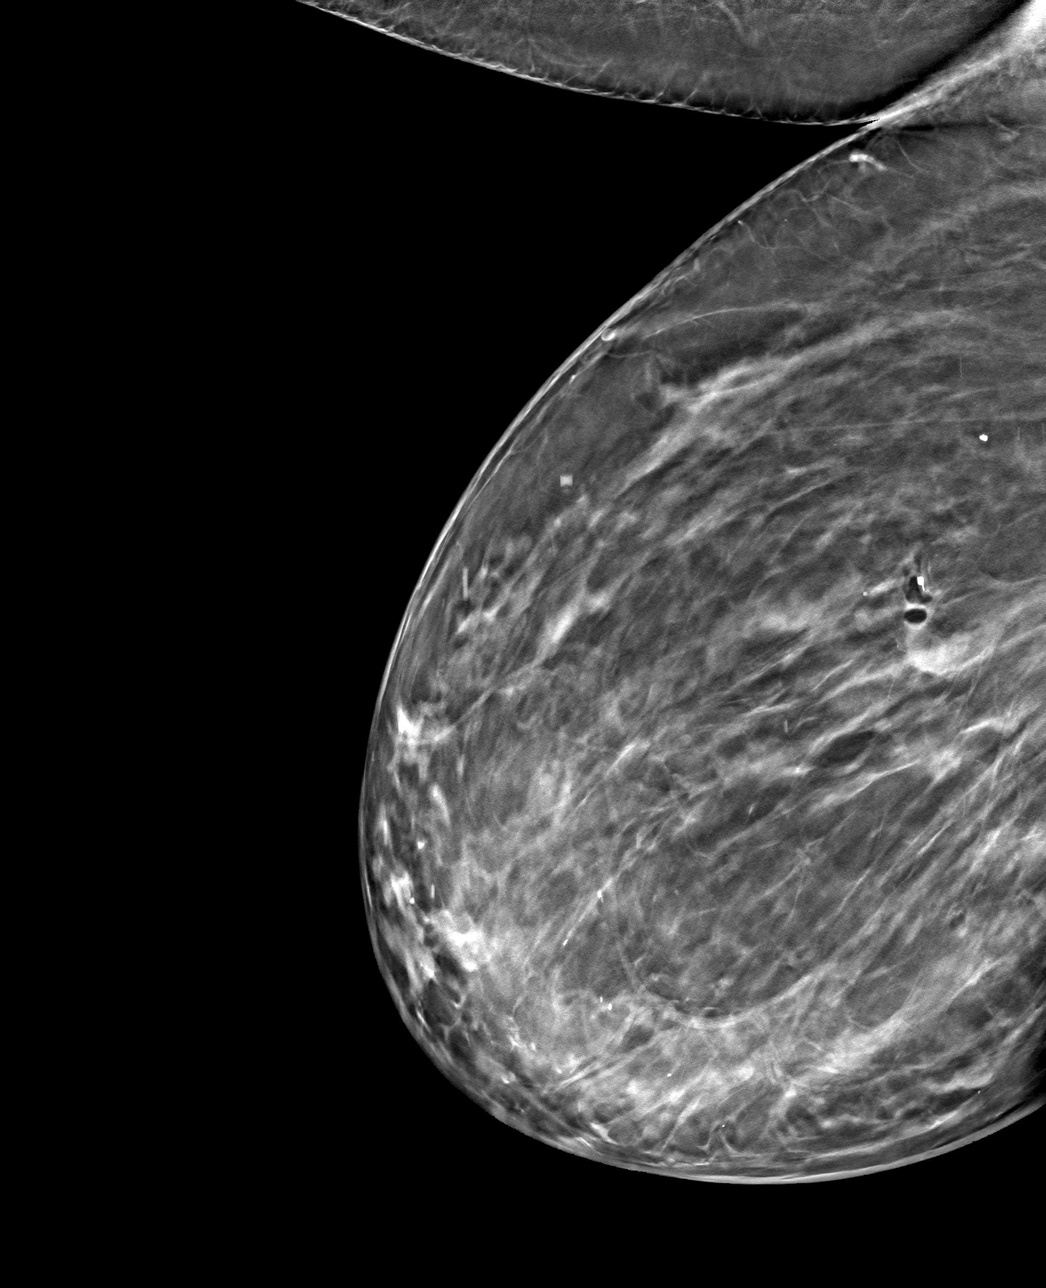

[R CC tomo · tomo slice 34/67.0]
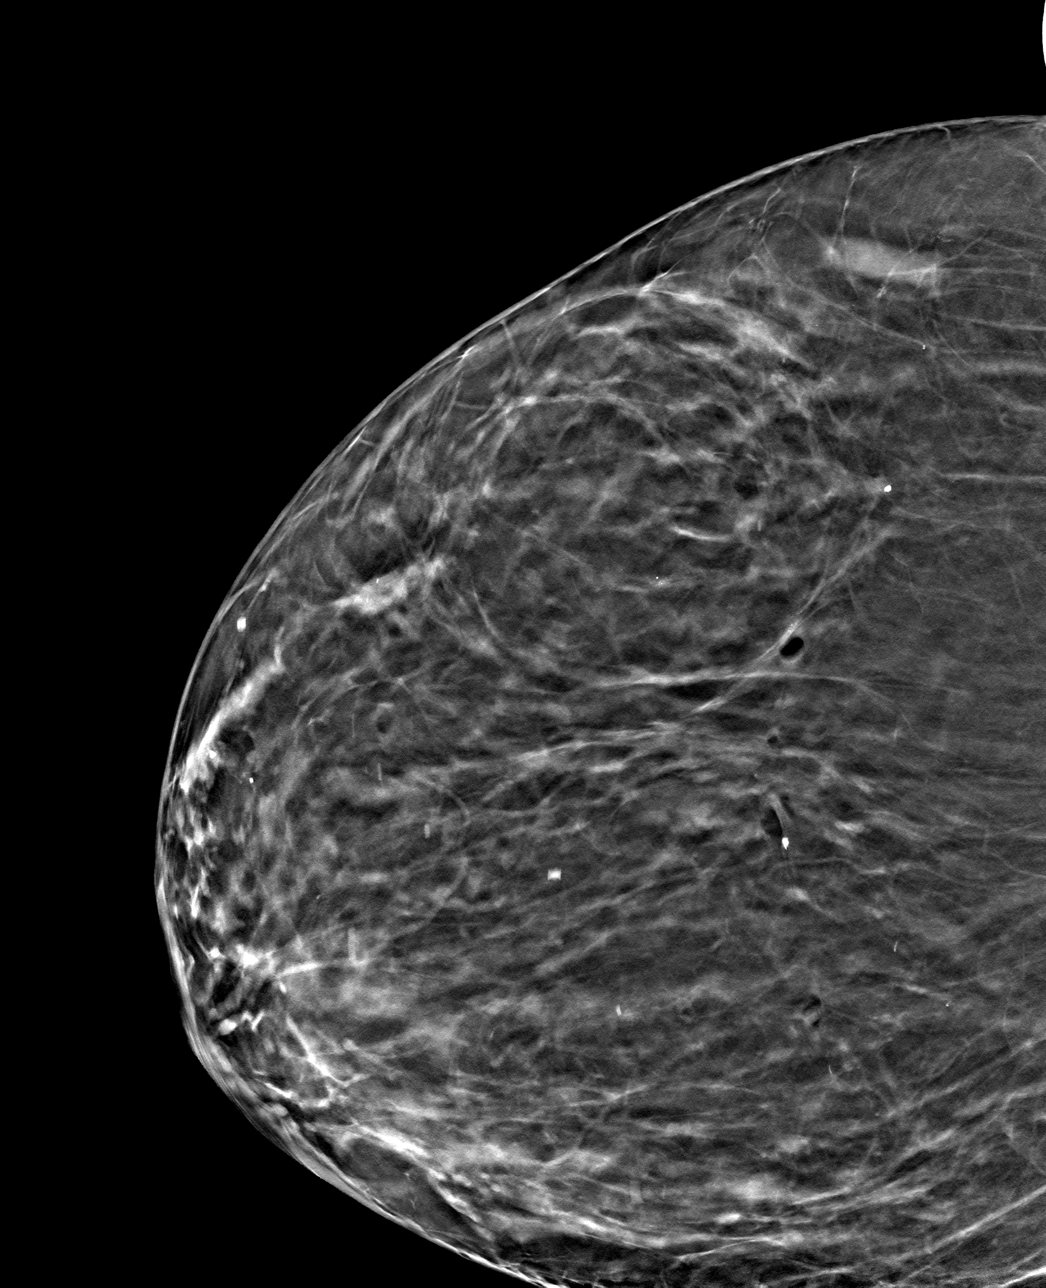

[4 of 12 positions shown; findings below may reference images not displayed]

FINDINGS: Mammographic images were obtained following stereotactic guided
biopsy of calcifications in the upper-outer right breast. The
biopsy marking clip is a coil shaped biopsy marking clip is present
however felt to be displaced 6.6 cm medially from the site of the
biopsied calcifications in the upper-outer right breast.
IMPRESSION: Coil shaped biopsy marking clip displaced 6.6 cm medial to the
biopsied calcifications in the upper-outer right breast.

Final Assessment: Post Procedure Mammograms for Marker Placement

## 2021-11-24 ENCOUNTER — Telehealth: Payer: Self-pay

## 2021-11-24 NOTE — Progress Notes (Signed)
Chronic Care Management Pharmacy Assistant   Name: Penny Gibson  MRN: [id number] DOB: [birth year]  Reason for Encounter: Hypertension Disease State Call.  Recent office visits:  No recent office visit  Recent consult visits:  10/27/2021 Dr. Erlinda Hong MD (Orthopedics) No Medication Changes noted  Hospital visits:  None in previous 6 months  Medications: Outpatient Encounter Medications as of 11/24/2021  Medication Sig   acetaminophen (TYLENOL) 500 MG tablet Take 500 mg by mouth every 6 (six) hours as needed.   albuterol (VENTOLIN HFA) 108 (90 Base) MCG/ACT inhaler INHALE 1 TO 2 PUFFS INTO THE LUNGS EVERY 6 HOURS AS NEEDED FOR WHEEZING OR SHORTNESS OF BREATH   ammonium lactate (AMLACTIN) 12 % lotion Apply 1 application topically as needed for dry skin.   Apoaequorin (PREVAGEN PO) Take 1 tablet by mouth daily.   aspirin EC 81 MG tablet Take 81 mg by mouth daily. Swallow whole.   Cholecalciferol (VITAMIN D3) 125 MCG (5000 UT) TABS Take 1 tablet by mouth daily.   diphenhydramine-acetaminophen (TYLENOL PM) 25-500 MG TABS tablet Take 1 tablet by mouth at bedtime.   fluticasone (FLONASE) 50 MCG/ACT nasal spray Place 2 sprays into both nostrils daily.   Garlic (GARLIQUE PO) Take 1 tablet by mouth daily.   lisinopril (ZESTRIL) 40 MG tablet Take 1 tablet (40 mg total) by mouth daily.   montelukast (SINGULAIR) 10 MG tablet Take 1 tablet (10 mg total) by mouth at bedtime.   Turmeric 500 MG CAPS Take 1,000 mg by mouth daily.   vitamin B-12 (CYANOCOBALAMIN) 1000 MCG tablet Take 1,000 mcg by mouth daily.   Facility-Administered Encounter Medications as of 11/24/2021  Medication   dexamethasone (DECADRON) injection 4 mg    Care Gaps: Shingrix Vaccine Fecal DNA (Cologuard) COVID-19 Vaccine HTN: 142/90 on 07/07/2021   Star Rating Drugs: Lisinopril 40 mg last filled on 10/14/2021 for 90 day supply at [facility].   Medication fill Gaps: None ID  Reviewed chart prior to disease  state call. Spoke with patient regarding BP  Recent Office Vitals: BP Readings from Last 3 Encounters:  07/07/21 (!) 142/90  04/20/21 (!) 164/103  03/26/21 140/90   Pulse Readings from Last 3 Encounters:  07/07/21 100  04/20/21 97  03/26/21 90    Wt Readings from Last 3 Encounters:  10/27/21 213 lb (96.6 kg)  07/07/21 217 lb (98.4 kg)  03/26/21 221 lb (100.2 kg)     Kidney Function Lab Results  Component Value Date/Time   CREATININE 1.05 07/07/2021 09:38 AM   CREATININE 1.10 03/26/2021 08:42 AM   CREATININE 0.77 10/11/2017 10:57 AM   CREATININE 0.77 08/16/2017 10:33 AM   CREATININE 0.88 06/22/2013 03:06 PM   GFR 52.47 (L) 07/07/2021 09:38 AM   GFRNONAA 15 (L) 03/14/2020 03:05 PM   GFRNONAA >60 10/11/2017 10:57 AM   GFRAA 17 (L) 03/14/2020 03:05 PM   GFRAA >60 10/11/2017 10:57 AM       Latest Ref Rng & Units 07/07/2021    9:38 AM 03/26/2021    8:42 AM 08/05/2020    8:26 AM  BMP  Glucose 70 - 99 mg/dL 104   94   95    BUN 6 - 23 mg/dL '14   22   17    '$ Creatinine 0.40 - 1.20 mg/dL 1.05   1.10   1.03    Sodium 135 - 145 mEq/L 139   138   137    Potassium 3.5 - 5.1 mEq/L 4.4   4.7  5.0    Chloride 96 - 112 mEq/L 100   101   100    CO2 19 - 32 mEq/L '29   28   31    '$ Calcium 8.4 - 10.5 mg/dL 9.3   9.5   9.7      Current antihypertensive regimen:  Lisinopril 40 mg daily  Patient denies any headaches,dizziness or lightheadedness.  How often are you checking your Blood Pressure? daily Patient reports she purchase a new monitor which seems to be working better for her.  Current home BP readings:  Patient reports her blood pressure ranges around 128/82.   What recent interventions/DTPs have been made by any provider to improve Blood Pressure control since last CPP Visit: None ID  Any recent hospitalizations or ED visits since last visit with CPP? No  What diet changes have been made to improve Blood Pressure Control?  Patient denies any changes to her diet.  What  exercise is being done to improve your Blood Pressure Control?  Patient denies any changes.  Patient denies any questions/concerns for the clinical pharmacist.  Adherence Review: Is the patient currently on ACE/ARB medication? Yes Does the patient have >5 day gap between last estimated fill dates? No   [name] Clinical Production designer, theatre/television/film [phone]

## 2021-12-15 ENCOUNTER — Other Ambulatory Visit: Payer: Self-pay

## 2021-12-16 ENCOUNTER — Telehealth: Payer: Self-pay | Admitting: *Deleted

## 2021-12-16 ENCOUNTER — Ambulatory Visit (INDEPENDENT_AMBULATORY_CARE_PROVIDER_SITE_OTHER): Payer: Medicare Other

## 2021-12-16 NOTE — Telephone Encounter (Signed)
Ortho bundle pre-op in person meeting completed.

## 2021-12-16 NOTE — Care Plan (Signed)
[facility] RNCM met with patient in office today during a pre-op weight check visit. Patient is an Ortho bundle through [facility] and is agreeable to case management. She is here today with her daughter and reviewed all post op care instructions with them both. Her daughter will be assisting her after surgery. Patient currently scheduled with Dr. Erlinda Hong on 01/04/22 for a Right total knee arthroplasty. Patient is on borderline of hard stop and we will give her 2 more weeks to get below the BMI hardstop of 40. She is scheduled for her Pre-admission appointment at [facility] on 12/30/21 and is aware that if her weight doesn't come down below the 40 BMI hardstop, she will not be allowed to proceed with surgery. An order for a home CPM, RW and 3in1/BSC will be sent to [facility] to be delivered prior to surgery. A referral for HHPT to [facility] will be sent after choice provided. Will continue to follow for all needs.

## 2021-12-17 ENCOUNTER — Telehealth: Payer: Self-pay | Admitting: Nurse Practitioner

## 2021-12-18 NOTE — Telephone Encounter (Signed)
Appt scheduled

## 2021-12-18 NOTE — Telephone Encounter (Signed)
See comment

## 2021-12-18 NOTE — Telephone Encounter (Signed)
Left VM, adv pt to call back to schedule a surgical clearance Appt. Per Penny Gibson, " Okay to put her on a different provider's schedule due to her time off next week."

## 2021-12-25 ENCOUNTER — Ambulatory Visit (INDEPENDENT_AMBULATORY_CARE_PROVIDER_SITE_OTHER): Payer: Medicare Other | Admitting: Family Medicine

## 2021-12-25 ENCOUNTER — Ambulatory Visit: Payer: Medicare Other | Admitting: Family Medicine

## 2021-12-25 ENCOUNTER — Encounter: Payer: Self-pay | Admitting: Family Medicine

## 2021-12-25 VITALS — BP 124/82 | HR 88 | Temp 97.3°F | Ht 61.0 in | Wt 208.8 lb

## 2021-12-25 DIAGNOSIS — N1831 Chronic kidney disease, stage 3a: Secondary | ICD-10-CM | POA: Diagnosis not present

## 2021-12-25 DIAGNOSIS — E1122 Type 2 diabetes mellitus with diabetic chronic kidney disease: Secondary | ICD-10-CM

## 2021-12-25 DIAGNOSIS — M1711 Unilateral primary osteoarthritis, right knee: Secondary | ICD-10-CM

## 2021-12-25 DIAGNOSIS — D509 Iron deficiency anemia, unspecified: Secondary | ICD-10-CM | POA: Diagnosis not present

## 2021-12-25 DIAGNOSIS — I517 Cardiomegaly: Secondary | ICD-10-CM

## 2021-12-25 DIAGNOSIS — I10 Essential (primary) hypertension: Secondary | ICD-10-CM

## 2021-12-25 DIAGNOSIS — Z01818 Encounter for other preprocedural examination: Secondary | ICD-10-CM

## 2021-12-25 LAB — COMPREHENSIVE METABOLIC PANEL
ALT: 8 U/L (ref 0–35)
AST: 11 U/L (ref 0–37)
Albumin: 4.4 g/dL (ref 3.5–5.2)
Alkaline Phosphatase: 94 U/L (ref 39–117)
BUN: 28 mg/dL — ABNORMAL HIGH (ref 6–23)
CO2: 26 mEq/L (ref 19–32)
Calcium: 10 mg/dL (ref 8.4–10.5)
Chloride: 98 mEq/L (ref 96–112)
Creatinine, Ser: 1.39 mg/dL — ABNORMAL HIGH (ref 0.40–1.20)
GFR: 37.35 mL/min — ABNORMAL LOW (ref >60.00)
Glucose, Bld: 90 mg/dL (ref 70–99)
Potassium: 4.9 mEq/L (ref 3.5–5.1)
Sodium: 134 mEq/L — ABNORMAL LOW (ref 135–145)
Total Bilirubin: 0.4 mg/dL (ref 0.2–1.2)
Total Protein: 9 g/dL — ABNORMAL HIGH (ref 6.0–8.3)

## 2021-12-25 LAB — CBC
HCT: 30 % — ABNORMAL LOW (ref 36.0–46.0)
Hemoglobin: 9.3 g/dL — ABNORMAL LOW (ref 12.0–15.0)
MCHC: 31.2 g/dL (ref 30.0–36.0)
MCV: 81.2 fl (ref 78.0–100.0)
Platelets: 312 10*3/uL (ref 150.0–400.0)
RBC: 3.69 Mil/uL — ABNORMAL LOW (ref 3.87–5.11)
RDW: 17.6 % — ABNORMAL HIGH (ref 11.5–15.5)
WBC: 9.1 10*3/uL (ref 4.0–10.5)

## 2021-12-25 LAB — HEMOGLOBIN A1C: Hgb A1c MFr Bld: 6.7 % — ABNORMAL HIGH (ref 4.6–6.5)

## 2021-12-25 NOTE — Progress Notes (Signed)
[facility] [address] [place] [address] Dept: [phone] Dept Fax: [phone]  Office Visit  Subjective:    Patient ID: Penny Gibson, female    DOB: [birth year], 76 y.o..   MRN: [id number]  Chief Complaint  Patient presents with   Pre-op Exam    Surgical clearance for RT knee .     History of Present Illness:  Patient is in today for a presurgical clearance for a total right knee joint replacement by Dr. Erlinda Hong. Her PCP ios unavailable so she was scheduled with me for this exam.  Ms. Statzer has a history of osteoarthritis impacting her knees. She had a prior left TKJ replacement in 02/2020. She notes that she does have daily pain and that it is difficult for her to get around. Her daughter, who is a Pharmacist, hospital, had hoped for her to have surgery as scheduled in 10 days, as she is off this summer and available to help.  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  Ms. Holcomb has a history of diet-controlled Type 2 diabetes. She has associated hypertension, managed on lisinopril. She had an echocardiogram in 2020 which showed some LVH, but no sign of heart failure.  Ms. Lanuza has a history of asthma. She occasionally uses an albuterol inhaler. She has only needed this once in the past month.  Ms. Condron has a history of iron deficiency anemia. This appears to have developed around the time of her last knee joint replacement. It had not recovered by the time she had her breast surgery. She notes an attempt was made to give her IV iron, but that she stopped breathing and passed out during the infusion. She had been advised to take iron supplements, but has not been doing this.  Past Medical History: Patient Active Problem List   Diagnosis Date Noted   Chronic kidney disease, stage 3a ([place]) 12/25/2021   Primary osteoarthritis of right knee 10/27/2021   Pain due to  onychomycosis of toenails of both feet 04/20/2021   Hallux limitus 04/20/2021   Gouty arthritis of right great toe 03/26/2021   Malignant neoplasm of upper-outer quadrant of right breast in female, estrogen receptor positive ([place]) 08/27/2020   Type 2 diabetes mellitus with diabetic chronic kidney disease ([place]) 07/18/2020   Vitamin D deficiency 07/18/2020   Hyperlipidemia associated with type 2 diabetes mellitus ([place]) 07/18/2020   Status post total left knee replacement 03/10/2020   Cardiomegaly 06/27/2018   Iron deficiency anemia 07/19/2017   Primary osteoarthritis of left knee 12/31/2016   Asthma, mild intermittent 05/28/2016   Other seasonal allergic rhinitis 07/26/2014   Atypical squamous cells of undetermined significance (ASCUS) on Papanicolaou smear of cervix 01/31/2014   Essential hypertension 09/19/2013   Generalized osteoarthritis of multiple sites    Past Surgical History:  Procedure Laterality Date   BREAST BIOPSY Left 2010   BREAST LUMPECTOMY Left 2010   BREAST LUMPECTOMY WITH RADIOACTIVE SEED LOCALIZATION Right 09/19/2020   Procedure: RIGHT BREAST LUMPECTOMY WITH RADIOACTIVE SEED LOCALIZATION;  Surgeon: [name], MD;  Location: [facility];  Service: General;  Laterality: Right;   BREAST SURGERY Right 09/19/2020   EYE SURGERY Bilateral    cataract surgery   REPLACEMENT TOTAL KNEE  03/10/2020   TOTAL KNEE ARTHROPLASTY Left 03/10/2020   Procedure: LEFT TOTAL KNEE ARTHROPLASTY;  Surgeon: Leandrew Koyanagi, MD;  Location: [facility];  Service:  Orthopedics;  Laterality: Left;   TUBAL LIGATION     Family History  Problem Relation Age of Onset   Heart attack Mother    Heart disease Mother    Sudden death Mother    Obesity Mother    Cancer Sister    Breast cancer Sister 48   Stroke Maternal Grandmother    Sudden death Father    Outpatient Medications Prior to Visit  Medication Sig Dispense Refill   acetaminophen (TYLENOL) 500 MG tablet  Take 500 mg by mouth every 6 (six) hours as needed for moderate pain.     albuterol (VENTOLIN HFA) 108 (90 Base) MCG/ACT inhaler INHALE 1 TO 2 PUFFS INTO THE LUNGS EVERY 6 HOURS AS NEEDED FOR WHEEZING OR SHORTNESS OF BREATH 20.1 g 0   ammonium lactate (AMLACTIN) 12 % lotion Apply 1 application topically as needed for dry skin. 400 g 0   Apoaequorin (PREVAGEN PO) Take 1 tablet by mouth daily.     APPLE CIDER VINEGAR PO Take 1 capsule by mouth daily.     aspirin EC 81 MG tablet Take 81 mg by mouth daily. Swallow whole.     Cholecalciferol (VITAMIN D3) 125 MCG (5000 UT) TABS Take 5,000 Units by mouth daily.     diphenhydramine-acetaminophen (TYLENOL PM) 25-500 MG TABS tablet Take 2 tablets by mouth at bedtime as needed (sleep/pain).     fluticasone (FLONASE) 50 MCG/ACT nasal spray Place 2 sprays into both nostrils daily. 16 g 0   Garlic (GARLIQUE PO) Take 1 tablet by mouth daily.     lisinopril (ZESTRIL) 40 MG tablet Take 1 tablet (40 mg total) by mouth daily. 90 tablet 3   montelukast (SINGULAIR) 10 MG tablet Take 1 tablet (10 mg total) by mouth at bedtime. 90 tablet 3   Multiple Vitamin (MULTIVITAMIN WITH MINERALS) TABS tablet Take 1 tablet by mouth daily.     Turmeric 500 MG CAPS Take 1,000 mg by mouth daily.     vitamin B-12 (CYANOCOBALAMIN) 1000 MCG tablet Take 1,000 mcg by mouth daily.     Facility-Administered Medications Prior to Visit  Medication Dose Route Frequency Provider Last Rate Last Admin   dexamethasone (DECADRON) injection 4 mg  4 mg Intra-articular Once Lorenda Peck, DPM       Allergies  Allergen Reactions   Feraheme [Ferumoxytol] Anaphylaxis   Penicillins Anaphylaxis   Allopurinol Nausea Only   Atorvastatin Other (See Comments)    dizziness   Citric Acid Rash    Objective:   Today's Vitals   12/25/21 1051  BP: 124/82  Pulse: 88  Temp: (!) 97.3 F (36.3 C)  TempSrc: Temporal  SpO2: 96%  Weight: 208 lb 12.8 oz (94.7 kg)  Height: '5\' 1"'$  (1.549 m)   Body mass  index is 39.45 kg/m.   General: Obese female. No acute distress. HEENT: Normocephalic, non-traumatic. Conjunctiva clear.  Nose clear without congestion or rhinorrhea. Mucous   membranes moist. Oropharynx clear. Good dentition. Neck: Supple. No lymphadenopathy. No thyromegaly. Lungs: Clear to auscultation bilaterally. No wheezing, rales or rhonchi. CV: RRR without murmurs or rubs. Pulses 2+ bilaterally. Extremities: Full ROM. Crepitance and popping of the right knee noted with both flexion and extension. No joint   swelling. Moderate tenderness with motion.  Skin: Warm and dry. No rashes. Psych: Alert and oriented. Normal mood and affect.  Health Maintenance Due  Topic Date Due   Zoster Vaccines- Shingrix (1 of 2) Never done   Fecal DNA (Cologuard)  10/06/2019  Lab Results  Component Ref Range & Units 5 mo ago (07/07/21) 9 mo ago (03/26/21) 1 yr ago (08/05/20) 1 yr ago (03/14/20) 1 yr ago (03/07/20) 1 yr ago (12/28/19) 1 yr ago (12/28/19)  WBC 4.0 - 10.5 K/uL 9.9  7.6  11.7 High   12.9 High   11.1 High    10.8 R   RBC 3.87 - 5.11 Mil/uL 4.06  3.71 Low   3.56 Low   3.15 Low  R  4.05 R   4.19 R   Platelets 150.0 - 400.0 K/uL 375.0  304.0  313.0  256 R  203 R   321 R   Hemoglobin 12.0 - 15.0 g/dL 9.7 Low   9.7 Low   9.7 Low   8.7 Low   11.1 Low    11.6 R   HCT 36.0 - 46.0 % 31.8 Low   31.0 Low   30.6 Low   28.9 Low   36.1  35.8 R    MCV 78.0 - 100.0 fl 78.4  83.6  86.1  91.7 R  89.1 R   85 R   MCHC 30.0 - 36.0 g/dL 30.4  31.3  31.8  30.1  30.7   32.4 R   RDW 11.5 - 15.5 % 19.2 High   17.6 High   15.0  16.3 High   15.9 High    14.3 R    Component Ref Range & Units 5 mo ago (07/07/21) 9 mo ago (03/26/21) 1 yr ago (08/05/20) 1 yr ago (03/14/20) 1 yr ago (03/07/20) 1 yr ago (12/28/19) 3 yr ago (06/30/18)  Sodium 135 - 145 mEq/L 139  138  137  132 Low  R  135 R  137 R  139   Potassium 3.5 - 5.1 mEq/L 4.4  4.7  5.0  4.4 R  5.1 R  4.4 R  3.7   Chloride 96 - 112 mEq/L 100  101  100  102 R  102 R   97 R  95 Low    CO2 19 - 32 mEq/L '29  28  31  15 '$ Low  R  21 Low  R  24 R  32   Glucose, Bld 70 - 99 mg/dL 104 High   94  95  93 CM  106 High  CM  116 High  R  99   BUN 6 - 23 mg/dL '14  22  17  '$ 63 High  R  36 High  R  15 R  13   Creatinine, Ser 0.40 - 1.20 mg/dL 1.05  1.10  1.03  3.02 High  R  1.39 High  R  0.94 R  0.71   GFR >60.00 mL/min 52.47 Low   49.72 Low  CM  54.05 Low  CM     104.32      Assessment & Plan:   1. Preoperative examination Exam completed in the absence of patient's PCP. I have concerns related to the chronic anemia in light of potential blood loss that can occur with knee joint replacement. I will check current CBC and iron panel and share my concerns with Dr. Erlinda Hong. It may be appropriate to delay surgery in order to try and improve anemia and potentially avoid the ned for transfusion.  2. Primary osteoarthritis of right knee Agree with need for TKJ replacement.  3. Iron deficiency anemia, unspecified iron deficiency anemia type As above. Patient has persistent anemia since her last knee joint surgery and breast surgery. Her  MCV has progressively declined, consistent with iron deficiency. An attempt was made previously by her oncologist to provide IV iron, but she developed a critical reaction. I will check her CBC and iron panel. I have advised her to start taking an OTC iron supplement twice a day. I discussed increased dietary fiber and fiber supplementation to help with constipation.  - CBC - Iron, TIBC and Ferritin Panel  4. Type 2 diabetes mellitus with stage 3a chronic kidney disease, without long-term current use of insulin ([place]) Prior A1cs have been at goal with dietary control of diabetes.  - Hemoglobin A1c - Comprehensive metabolic panel  5. Chronic kidney disease, stage 3a ([place]) Review of prior labs shows mild to moderate decline of GFR. Blood pressure control is good at this point.  - Comprehensive metabolic panel  6. Essential hypertension Blood  pressure in good control. I recommend she continues on lisinopril 40 mg daily.  - Comprehensive metabolic panel  7. Cardiomegaly Echocardiogram in 2020 showed LVH with a normal EF and heart function. No interval development of signs of heart failure.  Return in about 4 weeks (around 01/22/2022) for Reassessment with PCP.   Haydee Salter, MD  Addendum:     Latest Ref Rng & Units 12/25/2021   11:35 AM 07/07/2021    9:38 AM 03/26/2021    8:42 AM  CBC  WBC 4.0 - 10.5 K/uL 9.1  9.9  7.6   Hemoglobin 12.0 - 15.0 g/dL 9.3  9.7  9.7   Hematocrit 36.0 - 46.0 % 30.0  31.8  31.0   Platelets 150.0 - 400.0 K/uL 312.0  375.0  304.0    Component Ref Range & Units 3 d ago (12/25/21) 9 mo ago (03/26/21) 1 yr ago (08/05/20) 2 yr ago (12/28/19) 4 yr ago (10/11/17) 4 yr ago (10/11/17) 4 yr ago (08/16/17) 4 yr ago (08/16/17)  Iron 45 - 160 mcg/dL 26 Low     44 R   33 Low  R   32 Low  R   TIBC 250 - 450 mcg/dL (calc) 404    319 R   335 R   305 R   %SAT 16 - 45 % (calc) 6 Low           Ferritin 16 - 288 ng/mL 13 Low   12.1 R  16.4 R  66 R  26 R, CM   92 R, CM     Iron deficiency anemia, unspecified iron deficiency anemia type Progressive anemia with iron deficiency. I recommend delaying knee replacement until this issue has been corrected. She should follow up in 1-2 months with her PCP.  Haydee Salter, MD

## 2021-12-25 NOTE — Patient Instructions (Signed)
Take OTC iron supplement twice a day.

## 2021-12-26 LAB — IRON,TIBC AND FERRITIN PANEL
%SAT: 6 % (calc) — ABNORMAL LOW (ref 16–45)
Ferritin: 13 ng/mL — ABNORMAL LOW (ref 16–288)
Iron: 26 ug/dL — ABNORMAL LOW (ref 45–160)
TIBC: 404 mcg/dL (calc) (ref 250–450)

## 2021-12-28 ENCOUNTER — Other Ambulatory Visit: Payer: Self-pay | Admitting: Physician Assistant

## 2021-12-28 ENCOUNTER — Telehealth: Payer: Self-pay | Admitting: Nurse Practitioner

## 2021-12-28 ENCOUNTER — Telehealth: Payer: Self-pay | Admitting: Orthopaedic Surgery

## 2021-12-28 DIAGNOSIS — D509 Iron deficiency anemia, unspecified: Secondary | ICD-10-CM

## 2021-12-28 MED ORDER — DOXYCYCLINE HYCLATE 100 MG PO CAPS: 100.0000 mg | ORAL_CAPSULE | Freq: Two times a day (BID) | ORAL | 0 refills | Status: DC

## 2021-12-28 MED ORDER — ONDANSETRON HCL 4 MG PO TABS: 4.0000 mg | ORAL_TABLET | Freq: Three times a day (TID) | ORAL | 0 refills | Status: DC | PRN

## 2021-12-28 MED ORDER — DOCUSATE SODIUM 100 MG PO CAPS: 100.0000 mg | ORAL_CAPSULE | Freq: Every day | ORAL | 2 refills | Status: DC | PRN

## 2021-12-28 MED ORDER — OXYCODONE-ACETAMINOPHEN 5-325 MG PO TABS: 1.0000 | ORAL_TABLET | Freq: Four times a day (QID) | ORAL | 0 refills | Status: DC | PRN

## 2021-12-28 MED ORDER — ASPIRIN 81 MG PO TBEC: 81.0000 mg | DELAYED_RELEASE_TABLET | Freq: Two times a day (BID) | ORAL | 0 refills | Status: AC

## 2021-12-28 MED ORDER — METHOCARBAMOL 750 MG PO TABS: 750.0000 mg | ORAL_TABLET | Freq: Two times a day (BID) | ORAL | 2 refills | Status: DC | PRN

## 2021-12-28 MED ORDER — IRON (FERROUS SULFATE) 325 (65 FE) MG PO TABS: 325.0000 mg | ORAL_TABLET | Freq: Two times a day (BID) | ORAL | 2 refills | Status: DC

## 2021-12-28 NOTE — Telephone Encounter (Signed)
FYI:   Patient is scheduled for RIGHT TKA on 01-04-22 (4th case).  Patient's PCP  [name], NP sent back clearance request and indicated patient is NOT cleared from a Medical Standpoint. Patient is moderately anemic, having never recovered to normal levels since last joint replacement.  Current Hbg=9.3 and is iron deficient.  I called patient and left message on voice mail for her to return call.

## 2021-12-28 NOTE — Telephone Encounter (Signed)
Pt called to say her surgery was cancelled due to her low iron. She would like something from the pharmacy rather than OTC. Please advise.

## 2021-12-28 NOTE — Telephone Encounter (Signed)
Ok thank you 

## 2021-12-28 NOTE — Telephone Encounter (Signed)
Patient notified VIA phone and will pick up RX. And will make a f/u with PCP to re-check.  Dm/cma

## 2021-12-29 ENCOUNTER — Encounter (HOSPITAL_COMMUNITY): Payer: Self-pay

## 2021-12-29 NOTE — Pre-Procedure Instructions (Signed)
Surgical Instructions    Your procedure is scheduled on January 04, 2022.  Report to [facility] Main Entrance "A" at 9:45 A.M., then check in with the Admitting office.  Call this number if you have problems the morning of surgery:  [phone]   If you have any questions prior to your surgery date call [phone]: Open Monday-Friday 8am-4pm    Remember:  Do not eat after midnight the night before your surgery  You may drink clear liquids until 9:15 AM the morning of your surgery.   Clear liquids allowed are: Water, Non-Citrus Juices (without pulp), Carbonated Beverages, Clear Tea, Black Coffee Only (NO MILK, CREAM OR POWDERED CREAMER of any kind), and Gatorade.  Patient Instructions  The night before surgery:  No food after midnight. ONLY clear liquids after midnight  The day of surgery (if you have diabetes): Drink ONE (1) 12 oz G2 given to you in your pre admission testing appointment by 9:15 AM the morning of surgery. Drink in one sitting. Do not sip.  This drink was given to you during your hospital  pre-op appointment visit.  Nothing else to drink after completing the  12 oz bottle of G2.         If you have questions, please contact your surgeon's office.     Take these medicines the morning of surgery with A SIP OF WATER:  acetaminophen (TYLENOL)   fluticasone (FLONASE)     Take these medicines the morning of surgery AS NEEDED:  albuterol (VENTOLIN HFA) - bring inhaler with you to hospital  docusate sodium (COLACE)   methocarbamol (ROBAXIN-750)   ondansetron (ZOFRAN)   As of today, STOP taking any Aspirin (unless otherwise instructed by your surgeon) Aleve, Naproxen, Ibuprofen, Motrin, Advil, Goody's, BC's, all herbal medications, fish oil, and all vitamins.                     Do NOT Smoke (Tobacco/Vaping) for 24 hours prior to your procedure.  If you use a CPAP at night, you may bring your mask/headgear for your overnight stay.   Contacts, glasses,  piercing's, hearing aid's, dentures or partials may not be worn into surgery, please bring cases for these belongings.    For patients admitted to the hospital, discharge time will be determined by your treatment team.   Patients discharged the day of surgery will not be allowed to drive home, and someone needs to stay with them for 24 hours.  SURGICAL WAITING ROOM VISITATION Patients having surgery or a procedure may have two support people in the waiting room. These visitors may be switched out with other visitors if needed. Children under the age of 37 must have an adult accompany them who is not the patient. If the patient needs to stay at the hospital during part of their recovery, the visitor guidelines for inpatient rooms apply.  Please refer to the [facility] website for the visitor guidelines for Inpatients (after your surgery is over and you are in a regular room).    Special instructions:   [place]- Preparing For Surgery  Before surgery, you can play an important role. Because skin is not sterile, your skin needs to be as free of germs as possible. You can reduce the number of germs on your skin by washing with CHG (chlorahexidine gluconate) Soap before surgery.  CHG is an antiseptic cleaner which kills germs and bonds with the skin to continue killing germs even after washing.    Oral Hygiene  is also important to reduce your risk of infection.  Remember - BRUSH YOUR TEETH THE MORNING OF SURGERY WITH YOUR REGULAR TOOTHPASTE  Please do not use if you have an allergy to CHG or antibacterial soaps. If your skin becomes reddened/irritated stop using the CHG.  Do not shave (including legs and underarms) for at least 48 hours prior to first CHG shower. It is OK to shave your face.  Please follow these instructions carefully.   Shower the NIGHT BEFORE SURGERY and the MORNING OF SURGERY  If you chose to wash your hair, wash your hair first as usual with your normal  shampoo.  After you shampoo, rinse your hair and body thoroughly to remove the shampoo.  Use CHG Soap as you would any other liquid soap. You can apply CHG directly to the skin and wash gently with a scrungie or a clean washcloth.   Apply the CHG Soap to your body ONLY FROM THE NECK DOWN.  Do not use on open wounds or open sores. Avoid contact with your eyes, ears, mouth and genitals (private parts). Wash Face and genitals (private parts)  with your normal soap.   Wash thoroughly, paying special attention to the area where your surgery will be performed.  Thoroughly rinse your body with warm water from the neck down.  DO NOT shower/wash with your normal soap after using and rinsing off the CHG Soap.  Pat yourself dry with a CLEAN TOWEL.  Wear CLEAN PAJAMAS to bed the night before surgery  Place CLEAN SHEETS on your bed the night before your surgery  DO NOT SLEEP WITH PETS.   Day of Surgery: Take a shower with CHG soap. Do not wear jewelry or makeup Do not wear lotions, powders, perfumes/colognes, or deodorant. Do not shave 48 hours prior to surgery.   Do not bring valuables to the hospital.  [facility] is not responsible for any belongings or valuables. Do not wear nail polish, gel polish, artificial nails, or any other type of covering on natural nails (fingers and toes) If you have artificial nails or gel coating that need to be removed by a nail salon, please have this removed prior to surgery. Artificial nails or gel coating may interfere with anesthesia's ability to adequately monitor your vital signs.  Wear Clean/Comfortable clothing the morning of surgery  Remember to brush your teeth WITH YOUR REGULAR TOOTHPASTE.   Please read over the following fact sheets that you were given.    If you received a COVID test during your pre-op visit  it is requested that you wear a mask when out in public, stay away from anyone that may not be feeling well and notify your surgeon if  you develop symptoms. If you have been in contact with anyone that has tested positive in the last 10 days please notify you surgeon.

## 2021-12-30 ENCOUNTER — Inpatient Hospital Stay (HOSPITAL_COMMUNITY)
Admission: RE | Admit: 2021-12-30 | Discharge: 2021-12-30 | Disposition: A | Discharge status: Home / Self Care | Payer: Medicare Other | Source: Ambulatory Visit

## 2022-01-04 ENCOUNTER — Encounter (HOSPITAL_COMMUNITY): Admission: RE | Payer: Self-pay | Source: Home / Self Care

## 2022-01-04 ENCOUNTER — Ambulatory Visit (HOSPITAL_COMMUNITY): Admission: RE | Admit: 2022-01-04 | Payer: Medicare Other | Source: Home / Self Care | Admitting: Orthopaedic Surgery

## 2022-01-04 DIAGNOSIS — M1711 Unilateral primary osteoarthritis, right knee: Secondary | ICD-10-CM

## 2022-01-04 SURGERY — ARTHROPLASTY, KNEE, TOTAL
Anesthesia: Spinal | Site: Knee | Laterality: Right

## 2022-01-05 ENCOUNTER — Telehealth: Payer: Self-pay | Admitting: Nurse Practitioner

## 2022-01-05 NOTE — Telephone Encounter (Signed)
Caller Name: Maycee Blasco Call back phone #: [phone]  Reason for Call: Pt was having stomach problems (cramps and constipation) she took iron pills and says she feels better, but would still like a call back.

## 2022-01-05 NOTE — Telephone Encounter (Signed)
Called & spoke w/ pt. Says she had cramps and abd pain, was Nauseous & just felt bad overall after taking the iron supplements. Says she used a laxative & had diarrhea all night, shortly after, she found some relief. She would like to know if she should cut down on her iron dosages because she is taking 2 a day. Please advise.

## 2022-01-05 NOTE — Telephone Encounter (Signed)
Pt advised.

## 2022-01-19 ENCOUNTER — Encounter: Payer: Medicare Other | Admitting: Orthopaedic Surgery

## 2022-01-20 ENCOUNTER — Encounter: Payer: Self-pay | Admitting: Nurse Practitioner

## 2022-01-20 ENCOUNTER — Ambulatory Visit (INDEPENDENT_AMBULATORY_CARE_PROVIDER_SITE_OTHER): Payer: Medicare Other | Admitting: Nurse Practitioner

## 2022-01-20 VITALS — BP 126/86 | HR 87 | Temp 97.7°F | Ht 61.0 in | Wt 209.2 lb

## 2022-01-20 DIAGNOSIS — D500 Iron deficiency anemia secondary to blood loss (chronic): Secondary | ICD-10-CM | POA: Diagnosis not present

## 2022-01-20 DIAGNOSIS — N1831 Chronic kidney disease, stage 3a: Secondary | ICD-10-CM | POA: Diagnosis not present

## 2022-01-20 DIAGNOSIS — E1122 Type 2 diabetes mellitus with diabetic chronic kidney disease: Secondary | ICD-10-CM

## 2022-01-20 DIAGNOSIS — K5903 Drug induced constipation: Secondary | ICD-10-CM | POA: Diagnosis not present

## 2022-01-20 LAB — CBC WITH DIFFERENTIAL/PLATELET
Basophils Absolute: 0.1 10*3/uL (ref 0.0–0.1)
Basophils Relative: 0.7 % (ref 0.0–3.0)
Eosinophils Absolute: 0.1 10*3/uL (ref 0.0–0.7)
Eosinophils Relative: 0.9 % (ref 0.0–5.0)
HCT: 29.7 % — ABNORMAL LOW (ref 36.0–46.0)
Hemoglobin: 9.3 g/dL — ABNORMAL LOW (ref 12.0–15.0)
Lymphocytes Relative: 19.6 % (ref 12.0–46.0)
Lymphs Abs: 2.1 10*3/uL (ref 0.7–4.0)
MCHC: 31.4 g/dL (ref 30.0–36.0)
MCV: 81.2 fl (ref 78.0–100.0)
Monocytes Absolute: 0.6 10*3/uL (ref 0.1–1.0)
Monocytes Relative: 5.7 % (ref 3.0–12.0)
Neutro Abs: 7.7 10*3/uL (ref 1.4–7.7)
Neutrophils Relative %: 73.1 % (ref 43.0–77.0)
Platelets: 339 10*3/uL (ref 150.0–400.0)
RBC: 3.66 Mil/uL — ABNORMAL LOW (ref 3.87–5.11)
RDW: 18.7 % — ABNORMAL HIGH (ref 11.5–15.5)
WBC: 10.6 10*3/uL — ABNORMAL HIGH (ref 4.0–10.5)

## 2022-01-20 LAB — MICROALBUMIN / CREATININE URINE RATIO
Creatinine,U: 80.6 mg/dL
Microalb Creat Ratio: 0.9 mg/g (ref 0.0–30.0)
Microalb, Ur: <0.7 mg/dL (ref 0.0–1.9)

## 2022-01-20 MED ORDER — SENNA-DOCUSATE SODIUM 8.6-50 MG PO TABS: 1.0000 | ORAL_TABLET | Freq: Every day | ORAL | 1 refills | Status: AC

## 2022-01-20 NOTE — Progress Notes (Addendum)
Established Patient Visit  Patient: Penny Gibson   DOB: [birth year]   76 y.o. Female  MRN: [id number] Visit Date: 01/20/2022  Subjective:    Chief Complaint  Patient presents with   Office Visit    Iron F/u Hasn't been tolerating the iron supplements very well No other concerns   Accompanied by daughter  HPI Iron deficiency anemia Knee arthroplasty canceled due to iron deficient anemia. Reports she is unable to tolerate oral supplement BID. She has been taking 1tab daily, but this led to constipation. She declined colonoscopy Last cologuard 2018: negative. She was advised about need to repeat this.  Repeat cbc and iron panel Advised about importance of iron rich diet: provided printed information.  Drug-induced constipation Due to iron supplement. Minimal improvement with miralax 17g daily and MOM EOD. She has BM EOD. Denies nay blood in stool.  Advised about risk of chronic laxative use. Maintain use of miralax in Am and add senna in PM. Encourage to increased daily exercise, dietary fiber and water intake.  Wt Readings from Last 3 Encounters:  01/20/22 209 lb 3.2 oz (94.9 kg)  12/25/21 208 lb 12.8 oz (94.7 kg)  12/16/21 211 lb 12.8 oz (96.1 kg)    Reviewed medical, surgical, and social history today  Medications: Outpatient Medications Prior to Visit  Medication Sig   acetaminophen (TYLENOL) 500 MG tablet Take 500 mg by mouth every 6 (six) hours as needed for moderate pain.   albuterol (VENTOLIN HFA) 108 (90 Base) MCG/ACT inhaler INHALE 1 TO 2 PUFFS INTO THE LUNGS EVERY 6 HOURS AS NEEDED FOR WHEEZING OR SHORTNESS OF BREATH   ammonium lactate (AMLACTIN) 12 % lotion Apply 1 application topically as needed for dry skin.   Apoaequorin (PREVAGEN PO) Take 1 tablet by mouth daily.   APPLE CIDER VINEGAR PO Take 1 capsule by mouth daily.   aspirin EC 81 MG tablet Take 1 tablet (81 mg total) by mouth 2 (two) times daily. To be taken after surgery to prevent  blood clots.  Swallow whole.   Cholecalciferol (VITAMIN D3) 125 MCG (5000 UT) TABS Take 5,000 Units by mouth daily.   diphenhydramine-acetaminophen (TYLENOL PM) 25-500 MG TABS tablet Take 2 tablets by mouth at bedtime as needed (sleep/pain).   doxycycline (VIBRAMYCIN) 100 MG capsule Take 1 capsule (100 mg total) by mouth 2 (two) times daily. To be taken after surgery (Patient not taking: Reported on 3/0/1601)   Garlic (GARLIQUE PO) Take 1 tablet by mouth daily.   Iron, Ferrous Sulfate, 325 (65 Fe) MG TABS Take 325 mg by mouth in the morning and at bedtime.   lisinopril (ZESTRIL) 40 MG tablet Take 1 tablet (40 mg total) by mouth daily.   montelukast (SINGULAIR) 10 MG tablet Take 1 tablet (10 mg total) by mouth at bedtime.   Multiple Vitamin (MULTIVITAMIN WITH MINERALS) TABS tablet Take 1 tablet by mouth daily.   Turmeric 500 MG CAPS Take 1,000 mg by mouth daily.   vitamin B-12 (CYANOCOBALAMIN) 1000 MCG tablet Take 1,000 mcg by mouth daily.   [DISCONTINUED] docusate sodium (COLACE) 100 MG capsule Take 1 capsule (100 mg total) by mouth daily as needed.   [DISCONTINUED] fluticasone (FLONASE) 50 MCG/ACT nasal spray Place 2 sprays into both nostrils daily. (Patient not taking: Reported on 01/20/2022)   [DISCONTINUED] methocarbamol (ROBAXIN-750) 750 MG tablet Take 1 tablet (750 mg total) by mouth 2 (two) times daily as needed for  muscle spasms. (Patient not taking: Reported on 01/20/2022)   [DISCONTINUED] ondansetron (ZOFRAN) 4 MG tablet Take 1 tablet (4 mg total) by mouth every 8 (eight) hours as needed for nausea or vomiting. (Patient not taking: Reported on 01/20/2022)   [DISCONTINUED] oxyCODONE-acetaminophen (PERCOCET) 5-325 MG tablet Take 1-2 tablets by mouth every 6 (six) hours as needed. To be taken after surgery (Patient not taking: Reported on 01/20/2022)   Facility-Administered Medications Prior to Visit  Medication Dose Route Frequency Provider   dexamethasone (DECADRON) injection 4 mg  4 mg  Intra-articular Once Lorenda Peck, DPM   Reviewed past medical and social history.   ROS per HPI above  Last CBC Lab Results  Component Value Date   WBC 9.1 12/25/2021   HGB 9.3 (L) 12/25/2021   HCT 30.0 (L) 12/25/2021   MCV 81.2 12/25/2021   MCH 27.6 03/14/2020   RDW 17.6 (H) 12/25/2021   PLT 312.0 49/70/2637   Last metabolic panel Lab Results  Component Value Date   GLUCOSE 90 12/25/2021   NA 134 (L) 12/25/2021   K 4.9 12/25/2021   CL 98 12/25/2021   CO2 26 12/25/2021   BUN 28 (H) 12/25/2021   CREATININE 1.39 (H) 12/25/2021   GFRNONAA 15 (L) 03/14/2020   CALCIUM 10.0 12/25/2021   PROT 9.0 (H) 12/25/2021   ALBUMIN 4.4 12/25/2021   LABGLOB 3.9 12/28/2019   AGRATIO 1.1 (L) 12/28/2019   BILITOT 0.4 12/25/2021   ALKPHOS 94 12/25/2021   AST 11 12/25/2021   ALT 8 12/25/2021   ANIONGAP 15 03/14/2020        Objective:  BP 126/86 (BP Location: Right Arm, Patient Position: Sitting, Cuff Size: Normal)   Pulse 87   Temp 97.7 F (36.5 C) (Temporal)   Ht '5\' 1"'$  (1.549 m)   Wt 209 lb 3.2 oz (94.9 kg)   SpO2 96%   BMI 39.53 kg/m      Physical Exam Cardiovascular:     Rate and Rhythm: Normal rate.     Pulses: Normal pulses.  Pulmonary:     Effort: Pulmonary effort is normal.  Abdominal:     General: Bowel sounds are normal. There is no distension.     Palpations: Abdomen is soft.     Tenderness: There is no abdominal tenderness. There is no guarding.  Neurological:     Mental Status: She is alert and oriented to person, place, and time.  Psychiatric:        Mood and Affect: Mood normal.        Behavior: Behavior normal.        Thought Content: Thought content normal.     No results found for any visits on 01/20/22.    Assessment & Plan:    Problem List Items Addressed This Visit       Digestive   Drug-induced constipation    Due to iron supplement. Minimal improvement with miralax 17g daily and MOM EOD. She has BM EOD. Denies nay blood in  stool.  Advised about risk of chronic laxative use. Maintain use of miralax in Am and add senna in PM. Encourage to increased daily exercise, dietary fiber and water intake.      Relevant Medications   sennosides-docusate sodium (SENOKOT-S) 8.6-50 MG tablet     Endocrine   Type 2 diabetes mellitus with diabetic chronic kidney disease (HCC)   Relevant Orders   Urine microalbumin-creatinine with uACR     Other   Iron deficiency anemia - Primary  Knee arthroplasty canceled due to iron deficient anemia. Reports she is unable to tolerate oral supplement BID. She has been taking 1tab daily, but this led to constipation. She declined colonoscopy Last cologuard 2018: negative. She was advised about need to repeat this.  Repeat cbc and iron panel Advised about importance of iron rich diet: provided printed information.      Relevant Orders   CBC with Differential/Platelet   Iron, TIBC and Ferritin Panel   Return in about 3 months (around 04/22/2022) for DM, hyperlipidemia, HTN (fasting).     [name], NP

## 2022-01-20 NOTE — Patient Instructions (Addendum)
Continue miralax in AM Start senna in PM Maintain iron rich diet. Go to lab.   Iron-Rich Diet  Iron is a mineral that helps your body produce hemoglobin. Hemoglobin is a protein in red blood cells that carries oxygen to your body's tissues. Eating too little iron may cause you to feel weak and tired, and it can increase your risk of infection. Iron is naturally found in many foods, and many foods have iron added to them (are iron-fortified). You may need to follow an iron-rich diet if you do not have enough iron in your body due to certain medical conditions. The amount of iron that you need each day depends on your age, your sex, and any medical conditions you have. Follow instructions from your health care provider or a dietitian about how much iron you should eat each day. What are tips for following this plan? Reading food labels Check food labels to see how many milligrams (mg) of iron are in each serving. Cooking Cook foods in pots and pans that are made from iron. Take these steps to make it easier for your body to absorb iron from certain foods: Soak beans overnight before cooking. Soak whole grains overnight and drain them before using. Ferment flours before baking, such as by using yeast in bread dough. Meal planning When you eat foods that contain iron, you should eat them with foods that are high in vitamin C. These include oranges, peppers, tomatoes, potatoes, and mangoes. Vitamin C helps your body absorb iron. Certain foods and drinks prevent your body from absorbing iron properly. Avoid eating these foods in the same meal as iron-rich foods or with iron supplements. These foods include: Coffee, black tea, and red wine. Milk, dairy products, and foods that are high in calcium. Beans and soybeans. Whole grains. General information Take iron supplements only as told by your health care provider. An overdose of iron can be life-threatening. If you were prescribed iron  supplements, take them with orange juice or a vitamin C supplement. When you eat iron-fortified foods or take an iron supplement, you should also eat foods that naturally contain iron, such as meat, poultry, and fish. Eating naturally iron-rich foods helps your body absorb the iron that is added to other foods or contained in a supplement. Iron from animal sources is better absorbed than iron from plant sources. What foods should I eat? Fruits Prunes. Raisins. Eat fruits high in vitamin C, such as oranges, grapefruits, and strawberries, with iron-rich foods. Vegetables Spinach (cooked). Green peas. Broccoli. Fermented vegetables. Eat vegetables high in vitamin C, such as leafy greens, potatoes, bell peppers, and tomatoes, with iron-rich foods. Grains Iron-fortified breakfast cereal. Iron-fortified whole-wheat bread. Enriched rice. Sprouted grains. Meats and other proteins Beef liver. Beef. [place]. Chicken. Oysters. Shrimp. [place]. Sardines. Chickpeas. Nuts. Tofu. Pumpkin seeds. Beverages Tomato juice. Fresh orange juice. Prune juice. Hibiscus tea. Iron-fortified instant breakfast shakes. Sweets and desserts Blackstrap molasses. Seasonings and condiments Tahini. Fermented soy sauce. Other foods Wheat germ. The items listed above may not be a complete list of recommended foods and beverages. Contact a dietitian for more information. What foods should I limit? These are foods that should be limited while eating iron-rich foods as they can reduce the absorption of iron in your body. Grains Whole grains. Bran cereal. Bran flour. Meats and other proteins Soybeans. Products made from soy protein. Black beans. Lentils. Mung beans. Split peas. Dairy Milk. Cream. Cheese. Yogurt. Cottage cheese. Beverages Coffee. Black tea. Red wine. Sweets and  desserts Cocoa. Chocolate. Ice cream. Seasonings and condiments Basil. Oregano. Large amounts of parsley. The items listed above may not be a complete  list of foods and beverages you should limit. Contact a dietitian for more information. Summary Iron is a mineral that helps your body produce hemoglobin. Hemoglobin is a protein in red blood cells that carries oxygen to your body's tissues. Iron is naturally found in many foods, and many foods have iron added to them (are iron-fortified). When you eat foods that contain iron, you should eat them with foods that are high in vitamin C. Vitamin C helps your body absorb iron. Certain foods and drinks prevent your body from absorbing iron properly, such as whole grains and dairy products. You should avoid eating these foods in the same meal as iron-rich foods or with iron supplements. This information is not intended to replace advice given to you by your health care provider. Make sure you discuss any questions you have with your health care provider. Document Revised: 05/19/2020 Document Reviewed: 05/19/2020 Elsevier Patient Education  [place].

## 2022-01-20 NOTE — Assessment & Plan Note (Signed)
Due to iron supplement. Minimal improvement with miralax 17g daily and MOM EOD. She has BM EOD. Denies nay blood in stool.  Advised about risk of chronic laxative use. Maintain use of miralax in Am and add senna in PM. Encourage to increased daily exercise, dietary fiber and water intake.

## 2022-01-20 NOTE — Assessment & Plan Note (Addendum)
Knee arthroplasty canceled due to iron deficient anemia. Reports she is unable to tolerate oral supplement BID. She has been taking 1tab daily, but this led to constipation. She declined colonoscopy Last cologuard 2018: negative. She was advised about need to repeat this.  Repeat cbc and iron panel Advised about importance of iron rich diet: provided printed information.

## 2022-01-22 ENCOUNTER — Telehealth: Payer: Self-pay | Admitting: Nurse Practitioner

## 2022-01-22 NOTE — Telephone Encounter (Signed)
Called & spoke to pt, she wanted to know what to do about continuing taking her iron supplements? She viewed her results through MyChart & wanted to know if she should stll be taking her iron pills. Adv pt once you release them to me, I will call & update her.

## 2022-01-22 NOTE — Telephone Encounter (Signed)
Adv pt via VM

## 2022-01-22 NOTE — Telephone Encounter (Signed)
Penny Gibson pt would like you to give her a call. She did not mention why

## 2022-01-25 ENCOUNTER — Other Ambulatory Visit: Payer: Self-pay

## 2022-01-25 NOTE — Telephone Encounter (Signed)
Called & spoke w/ pt, says she is still waiting on her iron labs to see if she should continue to take her iron supplements or not, trying to contact lab to see why this result hasn't been released. Also needed her montelukast refilled,she should have 3 more refills left at the pharmacy, but if not she is to call back so I can send in a new rx for her.

## 2022-01-25 NOTE — Telephone Encounter (Signed)
Please return a call to pt [phone], she would like to speak with Tayah

## 2022-01-25 NOTE — Telephone Encounter (Signed)
Called & spoke w/ pt

## 2022-01-27 ENCOUNTER — Encounter (INDEPENDENT_AMBULATORY_CARE_PROVIDER_SITE_OTHER): Payer: Self-pay

## 2022-02-01 NOTE — Telephone Encounter (Signed)
Error

## 2022-02-02 ENCOUNTER — Telehealth: Payer: Self-pay | Admitting: Nurse Practitioner

## 2022-02-02 NOTE — Telephone Encounter (Signed)
Pt would like you to give a call about blood work she has done

## 2022-02-02 NOTE — Telephone Encounter (Signed)
Called & spoke w/ pt, we are still waiting to hear back from iron labs

## 2022-02-03 ENCOUNTER — Other Ambulatory Visit: Payer: Self-pay

## 2022-02-03 DIAGNOSIS — D500 Iron deficiency anemia secondary to blood loss (chronic): Secondary | ICD-10-CM

## 2022-02-03 NOTE — Progress Notes (Signed)
Received a message from [place] from [facility] lab via secure chat on 02/02/2022, regarding the collection of Mrs. Deleonardis's labs on 01/20/2022. It was stated all labs were collected and sent to respective locations to be tested after contacting both [facility] and [facility] to try and find labs.     Pt has been contact by MA to return to [facility] lab to have lab's recollected orders have been re-entered.

## 2022-02-05 ENCOUNTER — Other Ambulatory Visit (INDEPENDENT_AMBULATORY_CARE_PROVIDER_SITE_OTHER): Payer: Medicare Other

## 2022-02-05 ENCOUNTER — Telehealth: Payer: Self-pay | Admitting: Nurse Practitioner

## 2022-02-05 DIAGNOSIS — D500 Iron deficiency anemia secondary to blood loss (chronic): Secondary | ICD-10-CM

## 2022-02-05 LAB — CBC WITH DIFFERENTIAL/PLATELET
Basophils Absolute: 0.1 10*3/uL (ref 0.0–0.1)
Basophils Relative: 1.1 % (ref 0.0–3.0)
Eosinophils Absolute: 0.2 10*3/uL (ref 0.0–0.7)
Eosinophils Relative: 1.6 % (ref 0.0–5.0)
HCT: 28.7 % — ABNORMAL LOW (ref 36.0–46.0)
Hemoglobin: 9.2 g/dL — ABNORMAL LOW (ref 12.0–15.0)
Lymphocytes Relative: 20.6 % (ref 12.0–46.0)
Lymphs Abs: 2 10*3/uL (ref 0.7–4.0)
MCHC: 31.9 g/dL (ref 30.0–36.0)
MCV: 80.8 fl (ref 78.0–100.0)
Monocytes Absolute: 0.7 10*3/uL (ref 0.1–1.0)
Monocytes Relative: 7.2 % (ref 3.0–12.0)
Neutro Abs: 6.9 10*3/uL (ref 1.4–7.7)
Neutrophils Relative %: 69.5 % (ref 43.0–77.0)
Platelets: 309 10*3/uL (ref 150.0–400.0)
RBC: 3.56 Mil/uL — ABNORMAL LOW (ref 3.87–5.11)
RDW: 18.7 % — ABNORMAL HIGH (ref 11.5–15.5)
WBC: 9.9 10*3/uL (ref 4.0–10.5)

## 2022-02-05 NOTE — Telephone Encounter (Signed)
Caller Name: Penny Gibson Call back phone #: [phone]  Reason for Call: Pt called, she had lab work done August 2nd at [facility] lab. She is stating that it has not yet shown up on MyChart and not sure why it would be taking this long. Please call her back

## 2022-02-05 NOTE — Telephone Encounter (Signed)
Spoke with pt matter resolved. Pt will return to [facility] lab on Monday morning to have labs recollected.

## 2022-02-05 NOTE — Telephone Encounter (Signed)
Caller Name: Darrion Macaulay Call back phone #: [phone]  Reason for Call: Pt called [facility] again, they can not release the results until they are requested from [facility] [place], after the results are requested she would like a call to speak about them

## 2022-02-06 LAB — IRON,TIBC AND FERRITIN PANEL
%SAT: 7 % (calc) — ABNORMAL LOW (ref 16–45)
Ferritin: 15 ng/mL — ABNORMAL LOW (ref 16–288)
Iron: 26 ug/dL — ABNORMAL LOW (ref 45–160)
TIBC: 365 mcg/dL (calc) (ref 250–450)

## 2022-02-09 ENCOUNTER — Ambulatory Visit (INDEPENDENT_AMBULATORY_CARE_PROVIDER_SITE_OTHER): Payer: Medicare Other | Admitting: Orthopaedic Surgery

## 2022-02-09 ENCOUNTER — Telehealth: Payer: Self-pay

## 2022-02-09 ENCOUNTER — Encounter: Payer: Self-pay | Admitting: Orthopaedic Surgery

## 2022-02-09 DIAGNOSIS — M1711 Unilateral primary osteoarthritis, right knee: Secondary | ICD-10-CM

## 2022-02-09 MED ORDER — LIDOCAINE HCL 1 % IJ SOLN
2.0000 mL | INTRAMUSCULAR | Status: AC | PRN
  Administered 2022-02-09: 2 mL

## 2022-02-09 MED ORDER — BUPIVACAINE HCL 0.5 % IJ SOLN
2.0000 mL | INTRAMUSCULAR | Status: AC | PRN
  Administered 2022-02-09: 2 mL via INTRA_ARTICULAR

## 2022-02-09 MED ORDER — METHYLPREDNISOLONE ACETATE 40 MG/ML IJ SUSP
40.0000 mg | INTRAMUSCULAR | Status: AC | PRN
  Administered 2022-02-09: 40 mg via INTRA_ARTICULAR

## 2022-02-09 NOTE — Telephone Encounter (Signed)
Auth needed for right knee gel injection  

## 2022-02-09 NOTE — Telephone Encounter (Signed)
VOB submitted for Durolane, right knee.  

## 2022-02-09 NOTE — Progress Notes (Signed)
Office Visit Note   Patient: Penny Gibson           Date of Birth: 1945/08/22           MRN: [id number] Visit Date: 02/09/2022              Requested by: Flossie Buffy, NP [place],  [place] [id number] PCP: Flossie Buffy, NP   Assessment & Plan: Visit Diagnoses:  1. Primary osteoarthritis of right knee     Plan: Natalyia returns today for follow-up of right knee DJD.  Her knee replacement surgery was canceled due to anemia.  She is currently on iron supplements.  She is requesting a cortisone injection today.  Also would like to try viscosupplementation.  She underwent a cortisone injection to the right knee well today.  We will call her once the Vis[place] has been approved.  This patient is diagnosed with osteoarthritis of the knee(s).    Radiographs show evidence of joint space narrowing, osteophytes, subchondral sclerosis and/or subchondral cysts.  This patient has knee pain which interferes with functional and activities of daily living.    This patient has experienced inadequate response, adverse effects and/or intolerance with conservative treatments such as acetaminophen, NSAIDS, topical creams, physical therapy or regular exercise, knee bracing and/or weight loss.   This patient has experienced inadequate response or has a contraindication to intra articular steroid injections for at least 3 months.   This patient is not scheduled to have a total knee replacement within 6 months of starting treatment with viscosupplementation.  Follow-Up Instructions: No follow-ups on file.   Orders:  No orders of the defined types were placed in this encounter.  No orders of the defined types were placed in this encounter.     Procedures: Large Joint Inj: R knee on 02/09/2022 9:35 AM Indications: pain Details: 22 G needle  Arthrogram: No  Medications: 40 mg methylPREDNISolone acetate 40 MG/ML; 2 mL lidocaine 1 %; 2 mL bupivacaine 0.5 % Consent was  given by the patient. Patient was prepped and draped in the usual sterile fashion.       Clinical Data: No additional findings.   Subjective: Chief Complaint  Patient presents with   Right Knee - Pain    HPI  Review of Systems   Objective: Vital Signs: There were no vitals taken for this visit.  Physical Exam  Ortho Exam  Specialty Comments:  No specialty comments available.  Imaging: No results found.   PMFS History: Patient Active Problem List   Diagnosis Date Noted   Drug-induced constipation 01/20/2022   Chronic kidney disease, stage 3a ([place]) 12/25/2021   Primary osteoarthritis of right knee 10/27/2021   Pain due to onychomycosis of toenails of both feet 04/20/2021   Hallux limitus 04/20/2021   Gouty arthritis of right great toe 03/26/2021   Malignant neoplasm of upper-outer quadrant of right breast in female, estrogen receptor positive ([place]) 08/27/2020   Type 2 diabetes mellitus with diabetic chronic kidney disease ([place]) 07/18/2020   Vitamin D deficiency 07/18/2020   Hyperlipidemia associated with type 2 diabetes mellitus ([place]) 07/18/2020   Status post total left knee replacement 03/10/2020   Cardiomegaly 06/27/2018   Iron deficiency anemia 07/19/2017   Primary osteoarthritis of left knee 12/31/2016   Asthma, mild intermittent 05/28/2016   Other seasonal allergic rhinitis 07/26/2014   Atypical squamous cells of undetermined significance (ASCUS) on Papanicolaou smear of cervix 01/31/2014   Essential hypertension 09/19/2013   Generalized osteoarthritis  of multiple sites    Past Medical History:  Diagnosis Date   Anemia    Arthritis    Asthma    Constipation    Diabetes mellitus without complication (HCC)    Dyspnea    Hyperlipidemia    Hypertension    Insomnia    Joint pain    Knee pain    Lactose intolerance    Lower extremity edema    Multiple food allergies    Osteoarthritis    Sinus problem     Family History  Problem Relation Age  of Onset   Heart attack Mother    Heart disease Mother    Sudden death Mother    Obesity Mother    Cancer Sister    Breast cancer Sister 62   Stroke Maternal Grandmother    Sudden death Father     Past Surgical History:  Procedure Laterality Date   BREAST BIOPSY Left 2010   BREAST LUMPECTOMY Left 2010   BREAST LUMPECTOMY WITH RADIOACTIVE SEED LOCALIZATION Right 09/19/2020   Procedure: RIGHT BREAST LUMPECTOMY WITH RADIOACTIVE SEED LOCALIZATION;  Surgeon: [name], MD;  Location: [facility];  Service: General;  Laterality: Right;   BREAST SURGERY Right 09/19/2020   EYE SURGERY Bilateral    cataract surgery   REPLACEMENT TOTAL KNEE  03/10/2020   TOTAL KNEE ARTHROPLASTY Left 03/10/2020   Procedure: LEFT TOTAL KNEE ARTHROPLASTY;  Surgeon: Leandrew Koyanagi, MD;  Location: [facility];  Service: Orthopedics;  Laterality: Left;   TUBAL LIGATION     Social History   Occupational History   Not on file  Tobacco Use   Smoking status: Never   Smokeless tobacco: Never  Vaping Use   Vaping Use: Never used  Substance and Sexual Activity   Alcohol use: No   Drug use: No   Sexual activity: Not Currently    Birth control/protection: Surgical

## 2022-02-17 ENCOUNTER — Ambulatory Visit (HOSPITAL_COMMUNITY)
Admission: EM | Admit: 2022-02-17 | Discharge: 2022-02-17 | Disposition: A | Discharge status: Home / Self Care | Payer: Medicare Other | Attending: Nurse Practitioner | Admitting: Nurse Practitioner

## 2022-02-17 ENCOUNTER — Encounter (HOSPITAL_COMMUNITY): Payer: Self-pay

## 2022-02-17 DIAGNOSIS — J019 Acute sinusitis, unspecified: Secondary | ICD-10-CM | POA: Diagnosis not present

## 2022-02-17 DIAGNOSIS — H6122 Impacted cerumen, left ear: Secondary | ICD-10-CM | POA: Diagnosis not present

## 2022-02-17 MED ORDER — DOXYCYCLINE HYCLATE 100 MG PO CAPS: 100.0000 mg | ORAL_CAPSULE | Freq: Two times a day (BID) | ORAL | 0 refills | Status: AC

## 2022-02-17 NOTE — Discharge Instructions (Addendum)
Started on doxycyline 100 mg bid x 5 days for Sinusitis (relieves some of symptoms associated with sinusitis. Recommend using Coricidin HBP for additional symptom relief.  You declined COVID, Influenza and Strep Test  We encourage conservative treatment with symptom relief. We encourage you to use Tylenol alternating with Ibuprofen for your fever if not contraindicated. (Remember to use as directed do not exceed daily dosing recommendations) If you develop cough can be soothed with a cough suppressant and OTC decongestant

## 2022-02-17 NOTE — ED Triage Notes (Signed)
Pt has pain in both ears and sinus pressure x3 days . Pt denies of fever

## 2022-02-17 NOTE — ED Provider Notes (Signed)
[place]    CSN: [id number] Arrival date & time: 02/17/22  0840      History   Chief Complaint Chief Complaint  Patient presents with   Otalgia   Facial Pain    HPI Penny Gibson is a 76 y.o. female.   HPI  She is complaining of ear pain and pressure for one week with nasal congestion, sneezing, runny nose,. She feels clogged. Denies ever, chills,  cough, new sore throat, new loss of smell or taste, shortness of breath, chest pain, nausea, or diarrhea. Denies exposure positive/negative COVID/Influenza/Strep. The current treatment has been Singular with no relief.  Past Medical History:  Diagnosis Date   Anemia    Arthritis    Asthma    Constipation    Diabetes mellitus without complication (HCC)    Dyspnea    Hyperlipidemia    Hypertension    Insomnia    Joint pain    Knee pain    Lactose intolerance    Lower extremity edema    Multiple food allergies    Osteoarthritis    Sinus problem     Patient Active Problem List   Diagnosis Date Noted   Drug-induced constipation 01/20/2022   Chronic kidney disease, stage 3a ([place]) 12/25/2021   Primary osteoarthritis of right knee 10/27/2021   Pain due to onychomycosis of toenails of both feet 04/20/2021   Hallux limitus 04/20/2021   Gouty arthritis of right great toe 03/26/2021   Malignant neoplasm of upper-outer quadrant of right breast in female, estrogen receptor positive ([place]) 08/27/2020   Type 2 diabetes mellitus with diabetic chronic kidney disease ([place]) 07/18/2020   Vitamin D deficiency 07/18/2020   Hyperlipidemia associated with type 2 diabetes mellitus ([place]) 07/18/2020   Status post total left knee replacement 03/10/2020   Cardiomegaly 06/27/2018   Iron deficiency anemia 07/19/2017   Primary osteoarthritis of left knee 12/31/2016   Asthma, mild intermittent 05/28/2016   Other seasonal allergic rhinitis 07/26/2014   Atypical squamous cells of undetermined significance (ASCUS) on Papanicolaou  smear of cervix 01/31/2014   Essential hypertension 09/19/2013   Generalized osteoarthritis of multiple sites     Past Surgical History:  Procedure Laterality Date   BREAST BIOPSY Left 2010   BREAST LUMPECTOMY Left 2010   BREAST LUMPECTOMY WITH RADIOACTIVE SEED LOCALIZATION Right 09/19/2020   Procedure: RIGHT BREAST LUMPECTOMY WITH RADIOACTIVE SEED LOCALIZATION;  Surgeon: [name], MD;  Location: [place];  Service: General;  Laterality: Right;   BREAST SURGERY Right 09/19/2020   EYE SURGERY Bilateral    cataract surgery   REPLACEMENT TOTAL KNEE  03/10/2020   TOTAL KNEE ARTHROPLASTY Left 03/10/2020   Procedure: LEFT TOTAL KNEE ARTHROPLASTY;  Surgeon: Leandrew Koyanagi, MD;  Location: [place];  Service: Orthopedics;  Laterality: Left;   TUBAL LIGATION      OB History     Gravida  2   Para  2   Term  2   Preterm      AB      Living  2      SAB      IAB      Ectopic      Multiple      Live Births  2            Home Medications    Prior to Admission medications   Medication Sig Start Date End Date Taking? Authorizing Provider  doxycycline (VIBRAMYCIN) 100 MG capsule Take 1  capsule (100 mg total) by mouth 2 (two) times daily for 5 days. 02/17/22 02/22/22 Yes Vevelyn Francois, NP  acetaminophen (TYLENOL) 500 MG tablet Take 500 mg by mouth every 6 (six) hours as needed for moderate pain.    [provider]  albuterol (VENTOLIN HFA) 108 (90 Base) MCG/ACT inhaler INHALE 1 TO 2 PUFFS INTO THE LUNGS EVERY 6 HOURS AS NEEDED FOR WHEEZING OR SHORTNESS OF BREATH 05/20/21   [name], [name], NP  ammonium lactate (AMLACTIN) 12 % lotion Apply 1 application topically as needed for dry skin. 04/29/21   Felipa Furnace, DPM  Apoaequorin (PREVAGEN PO) Take 1 tablet by mouth daily.    [provider]  APPLE CIDER VINEGAR PO Take 1 capsule by mouth daily.    [provider]  aspirin EC 81 MG tablet Take 1 tablet (81 mg  total) by mouth 2 (two) times daily. To be taken after surgery to prevent blood clots.  Swallow whole. 12/28/21 12/28/22  Aundra Dubin, PA-C  Cholecalciferol (VITAMIN D3) 125 MCG (5000 UT) TABS Take 5,000 Units by mouth daily.    [provider]  diphenhydramine-acetaminophen (TYLENOL PM) 25-500 MG TABS tablet Take 2 tablets by mouth at bedtime as needed (sleep/pain).    [provider]  Garlic (GARLIQUE PO) Take 1 tablet by mouth daily.    [provider]  Iron, Ferrous Sulfate, 325 (65 Fe) MG TABS Take 325 mg by mouth in the morning and at bedtime. 12/28/21   Haydee Salter, MD  lisinopril (ZESTRIL) 40 MG tablet Take 1 tablet (40 mg total) by mouth daily. 07/07/21   [name], [name], NP  montelukast (SINGULAIR) 10 MG tablet Take 1 tablet (10 mg total) by mouth at bedtime. 07/07/21   [name], [name], NP  Multiple Vitamin (MULTIVITAMIN WITH MINERALS) TABS tablet Take 1 tablet by mouth daily.    [provider]  sennosides-docusate sodium (SENOKOT-S) 8.6-50 MG tablet Take 1 tablet by mouth at bedtime. 01/20/22   [name], [name], NP  Turmeric 500 MG CAPS Take 1,000 mg by mouth daily.    [provider]  vitamin B-12 (CYANOCOBALAMIN) 1000 MCG tablet Take 1,000 mcg by mouth daily.    [provider]    Family History Family History  Problem Relation Age of Onset   Heart attack Mother    Heart disease Mother    Sudden death Mother    Obesity Mother    Cancer Sister    Breast cancer Sister 56   Stroke Maternal Grandmother    Sudden death Father     Social History Social History   Tobacco Use   Smoking status: Never   Smokeless tobacco: Never  Vaping Use   Vaping Use: Never used  Substance Use Topics   Alcohol use: No   Drug use: No     Allergies   Feraheme [ferumoxytol], Penicillins, Allopurinol, Atorvastatin, and Citric acid   Review of Systems Review of Systems   Physical Exam Triage Vital Signs ED Triage  Vitals  Enc Vitals Group     BP 02/17/22 0953 139/88     Pulse Rate 02/17/22 0953 83     Resp 02/17/22 0953 12     Temp 02/17/22 0953 97.6 F (36.4 C)     Temp Source 02/17/22 0953 Oral     SpO2 02/17/22 0953 98 %     Weight 02/17/22 0950 207 lb 6.4 oz (94.1 kg)     Height --  Head Circumference --      Peak Flow --      Pain Score 02/17/22 0949 6     Pain Loc --      Pain Edu? --      Excl. in [place]? --    No data found.  Updated Vital Signs BP 139/88 (BP Location: Left Arm)   Pulse 83   Temp 97.6 F (36.4 C) (Oral)   Resp 12   Wt 207 lb 6.4 oz (94.1 kg)   SpO2 98%   BMI 39.19 kg/m   Visual Acuity Right Eye Distance:   Left Eye Distance:   Bilateral Distance:    Right Eye Near:   Left Eye Near:    Bilateral Near:     Physical Exam Constitutional:      General: She is not in acute distress.    Appearance: She is obese. She is not ill-appearing, toxic-appearing or diaphoretic.  HENT:     Head: Normocephalic and atraumatic.     Right Ear: Tympanic membrane normal.     Left Ear: There is impacted cerumen.     Nose: No congestion or rhinorrhea.     Mouth/Throat:     Mouth: Mucous membranes are moist.     Pharynx: No oropharyngeal exudate or posterior oropharyngeal erythema.  Cardiovascular:     Rate and Rhythm: Normal rate. Rhythm irregular.     Pulses: Normal pulses.     Comments: Regularity  Pulmonary:     Effort: Pulmonary effort is normal.     Breath sounds: Normal breath sounds.  Musculoskeletal:        General: Normal range of motion.     Cervical back: Normal range of motion.  Neurological:     General: No focal deficit present.     Mental Status: She is alert and oriented to person, place, and time.  Psychiatric:        Behavior: Behavior normal.      UC Treatments / Results  Labs (all labs ordered are listed, but only abnormal results are displayed) Labs Reviewed - No data to display  EKG   Radiology No results  found.  Procedures Procedures (including critical care time)  Medications Ordered in UC Medications - No data to display  Initial Impression / Assessment and Plan / UC Course  I have reviewed the triage vital signs and the nursing notes.  Pertinent labs & imaging results that were available during my care of the patient were reviewed by me and considered in my medical decision making (see chart for details).     Ear pain   Final Clinical Impressions(s) / UC Diagnoses   Final diagnoses:  Impacted cerumen of left ear  Acute sinusitis, recurrence not specified, unspecified location     Discharge Instructions      Started on doxycyline 100 mg bid x 5 days for Sinusitis (relieves some of symptoms associated with sinusitis. Recommend using Coricidin HBP for additional symptom relief.  You declined COVID, Influenza and Strep Test  We encourage conservative treatment with symptom relief. We encourage you to use Tylenol alternating with Ibuprofen for your fever if not contraindicated. (Remember to use as directed do not exceed daily dosing recommendations) If you develop cough can be soothed with a cough suppressant and OTC decongestant      ED Prescriptions     Medication Sig Dispense Auth. Provider   doxycycline (VIBRAMYCIN) 100 MG capsule Take 1 capsule (100 mg total) by mouth 2 (two) times  daily for 5 days. 10 capsule Vevelyn Francois, NP      PDMP not reviewed this encounter.   Dionisio David Carlyle, [place] 02/17/22 1128

## 2022-03-03 ENCOUNTER — Telehealth: Payer: Self-pay

## 2022-03-03 ENCOUNTER — Other Ambulatory Visit: Payer: Self-pay

## 2022-03-03 DIAGNOSIS — M1711 Unilateral primary osteoarthritis, right knee: Secondary | ICD-10-CM

## 2022-03-03 NOTE — Progress Notes (Signed)
Chronic Care Management Pharmacy Assistant   Name: Penny Gibson  MRN: [id number] DOB: 07-12-1945  Reason for Encounter: Hypertension Disease State Call.   Recent office visits:  01/20/2022 Penny Lacy NP (PCP) Start sennosides-docusate sodium 8.6-50 MG tablet, Return in about 3 months 12/25/2021 Dr. Gena Fray MD (PCP Office) No medication Changes noted, Return in about 4 weeks  Recent consult visits:  02/09/2022 Dr. Erlinda Hong MD (Orthopedic Surgery) No medication Changes noted  Hospital visits:  Medication Reconciliation was completed by comparing discharge summary, patient's EMR and Pharmacy list, and upon discussion with patient.  Admitted to the hospital on 02/17/2022 due to Impacted Cerumen of left ear. Discharge date was 02/17/2022. Discharged from [facility] [place]?Medications Started at [facility] Discharge:?? -started doxycyline 100 mg bid x 5 days for Sinusitis   Medication Changes at Hospital Discharge: -Changed None ID  Medications Discontinued at Hospital Discharge: -Stopped None ID  Medications that remain the same after Hospital Discharge:??  -All other medications will remain the same.    Medications: Outpatient Encounter Medications as of 03/03/2022  Medication Sig   acetaminophen (TYLENOL) 500 MG tablet Take 500 mg by mouth every 6 (six) hours as needed for moderate pain.   albuterol (VENTOLIN HFA) 108 (90 Base) MCG/ACT inhaler INHALE 1 TO 2 PUFFS INTO THE LUNGS EVERY 6 HOURS AS NEEDED FOR WHEEZING OR SHORTNESS OF BREATH   ammonium lactate (AMLACTIN) 12 % lotion Apply 1 application topically as needed for dry skin.   Apoaequorin (PREVAGEN PO) Take 1 tablet by mouth daily.   APPLE CIDER VINEGAR PO Take 1 capsule by mouth daily.   aspirin EC 81 MG tablet Take 1 tablet (81 mg total) by mouth 2 (two) times daily. To be taken after surgery to prevent blood clots.  Swallow whole.   Cholecalciferol (VITAMIN D3) 125 MCG (5000 UT) TABS Take 5,000  Units by mouth daily.   diphenhydramine-acetaminophen (TYLENOL PM) 25-500 MG TABS tablet Take 2 tablets by mouth at bedtime as needed (sleep/pain).   Garlic (GARLIQUE PO) Take 1 tablet by mouth daily.   Iron, Ferrous Sulfate, 325 (65 Fe) MG TABS Take 325 mg by mouth in the morning and at bedtime.   lisinopril (ZESTRIL) 40 MG tablet Take 1 tablet (40 mg total) by mouth daily.   montelukast (SINGULAIR) 10 MG tablet Take 1 tablet (10 mg total) by mouth at bedtime.   Multiple Vitamin (MULTIVITAMIN WITH MINERALS) TABS tablet Take 1 tablet by mouth daily.   sennosides-docusate sodium (SENOKOT-S) 8.6-50 MG tablet Take 1 tablet by mouth at bedtime.   Turmeric 500 MG CAPS Take 1,000 mg by mouth daily.   vitamin B-12 (CYANOCOBALAMIN) 1000 MCG tablet Take 1,000 mcg by mouth daily.   Facility-Administered Encounter Medications as of 03/03/2022  Medication   dexamethasone (DECADRON) injection 4 mg    Care Gaps: Influenza Vaccine Fecal DNA (Cologuard) COVID-19 Vaccine    Star Rating Drugs: Lisinopril 40 mg last filled on 01/11/2022 for 90 day supply at [facility].   Medication fill Gaps: None ID  Reviewed chart prior to disease state call. Spoke with patient regarding BP  Recent Office Vitals: BP Readings from Last 3 Encounters:  02/17/22 139/88  01/20/22 126/86  12/25/21 124/82   Pulse Readings from Last 3 Encounters:  02/17/22 83  01/20/22 87  12/25/21 88    Wt Readings from Last 3 Encounters:  02/17/22 207 lb 6.4 oz (94.1 kg)  01/20/22 209 lb 3.2 oz (94.9 kg)  12/25/21 208 lb 12.8 oz (94.7 kg)     Kidney Function Lab Results  Component Value Date/Time   CREATININE 1.39 (H) 12/25/2021 11:35 AM   CREATININE 1.05 07/07/2021 09:38 AM   CREATININE 0.77 10/11/2017 10:57 AM   CREATININE 0.77 08/16/2017 10:33 AM   CREATININE 0.88 06/22/2013 03:06 PM   GFR 37.35 (L) 12/25/2021 11:35 AM   GFRNONAA 15 (L) 03/14/2020 03:05 PM   GFRNONAA >60 10/11/2017 10:57 AM   GFRAA 17  (L) 03/14/2020 03:05 PM   GFRAA >60 10/11/2017 10:57 AM       Latest Ref Rng & Units 12/25/2021   11:35 AM 07/07/2021    9:38 AM 03/26/2021    8:42 AM  BMP  Glucose 70 - 99 mg/dL 90  104  94   BUN 6 - 23 mg/dL '28  14  22   '$ Creatinine 0.40 - 1.20 mg/dL 1.39  1.05  1.10   Sodium 135 - 145 mEq/L 134  139  138   Potassium 3.5 - 5.1 mEq/L 4.9  4.4  4.7   Chloride 96 - 112 mEq/L 98  100  101   CO2 19 - 32 mEq/L '26  29  28   '$ Calcium 8.4 - 10.5 mg/dL 10.0  9.3  9.5     Current antihypertensive regimen:  Lisinopril 40 mg daily  Patient denies any headaches,dizziness or lightheadedness.  How often are you checking your Blood Pressure? daily  Current home BP readings:  Patient reports her blood pressure has been ranging around 130's/80's.  What recent interventions/DTPs have been made by any provider to improve Blood Pressure control since last CPP Visit: None ID  Any recent hospitalizations or ED visits since last visit with CPP? No What diet changes have been made to improve Blood Pressure Control?  Patient denies any changes to her diet.  What exercise is being done to improve your Blood Pressure Control?  Patient reports she exercise a few days through out the week.  Adherence Review: Is the patient currently on ACE/ARB medication? Yes Does the patient have >5 day gap between last estimated fill dates? No  Penny Gibson Clinical Production designer, theatre/television/film [phone]

## 2022-03-03 NOTE — Telephone Encounter (Signed)
Talked with patient and appointment has been made for gel injection.

## 2022-03-03 NOTE — Telephone Encounter (Signed)
Pt callin for an update

## 2022-03-09 ENCOUNTER — Ambulatory Visit (INDEPENDENT_AMBULATORY_CARE_PROVIDER_SITE_OTHER): Payer: Medicare Other | Admitting: Orthopaedic Surgery

## 2022-03-09 ENCOUNTER — Encounter: Payer: Self-pay | Admitting: Orthopaedic Surgery

## 2022-03-09 DIAGNOSIS — M1711 Unilateral primary osteoarthritis, right knee: Secondary | ICD-10-CM | POA: Diagnosis not present

## 2022-03-09 MED ORDER — SODIUM HYALURONATE 60 MG/3ML IX PRSY
60.0000 mg | PREFILLED_SYRINGE | INTRA_ARTICULAR | Status: AC | PRN
  Administered 2022-03-09: 60 mg via INTRA_ARTICULAR

## 2022-03-09 NOTE — Progress Notes (Signed)
Office Visit Note   Patient: Penny Gibson           Date of Birth: 1945-08-20           MRN: [id number] Visit Date: 03/09/2022              Requested by: Flossie Buffy, NP [place],  [place] [id number] PCP: Flossie Buffy, NP   Assessment & Plan: Visit Diagnoses:  1. Unilateral primary osteoarthritis, right knee     Plan: Impression is right knee osteoarthritis.  Today, proceed with right knee Durolane injection.  She tolerated this well.  She will follow-up with [place] as needed. Durolane: Lot [id number].  Expiration date 05/20/2024  Follow-Up Instructions: Return if symptoms worsen or fail to improve.   Orders:  No orders of the defined types were placed in this encounter.  No orders of the defined types were placed in this encounter.     Procedures: Large Joint Inj: R knee on 03/09/2022 11:58 AM Indications: pain Details: 22 G needle  Arthrogram: No  Medications: 60 mg Sodium Hyaluronate 60 MG/3ML Outcome: tolerated well, no immediate complications Patient was prepped and draped in the usual sterile fashion.       Clinical Data: No additional findings.   Subjective: Chief Complaint  Patient presents with   Right Knee - Follow-up    Durolane    HPI patient is a pleasant 76 year old female who comes in today for right knee Durolane injection.  History of underlying osteoarthritis.  She is undergone cortisone injections with temporary relief in symptoms.  No previous viscosupplementation injection.     Objective: Vital Signs: There were no vitals taken for this visit.    Ortho Exam unchanged right knee exam  Specialty Comments:  No specialty comments available.  Imaging: No new imaging   PMFS History: Patient Active Problem List   Diagnosis Date Noted   Drug-induced constipation 01/20/2022   Chronic kidney disease, stage 3a ([place]) 12/25/2021   Primary osteoarthritis of right knee 10/27/2021   Pain due to  onychomycosis of toenails of both feet 04/20/2021   Hallux limitus 04/20/2021   Gouty arthritis of right great toe 03/26/2021   Malignant neoplasm of upper-outer quadrant of right breast in female, estrogen receptor positive ([place]) 08/27/2020   Type 2 diabetes mellitus with diabetic chronic kidney disease ([place]) 07/18/2020   Vitamin D deficiency 07/18/2020   Hyperlipidemia associated with type 2 diabetes mellitus ([place]) 07/18/2020   Status post total left knee replacement 03/10/2020   Cardiomegaly 06/27/2018   Iron deficiency anemia 07/19/2017   Primary osteoarthritis of left knee 12/31/2016   Asthma, mild intermittent 05/28/2016   Other seasonal allergic rhinitis 07/26/2014   Atypical squamous cells of undetermined significance (ASCUS) on Papanicolaou smear of cervix 01/31/2014   Essential hypertension 09/19/2013   Generalized osteoarthritis of multiple sites    Past Medical History:  Diagnosis Date   Anemia    Arthritis    Asthma    Constipation    Diabetes mellitus without complication (HCC)    Dyspnea    Hyperlipidemia    Hypertension    Insomnia    Joint pain    Knee pain    Lactose intolerance    Lower extremity edema    Multiple food allergies    Osteoarthritis    Sinus problem     Family History  Problem Relation Age of Onset   Heart attack Mother    Heart disease Mother  Sudden death Mother    Obesity Mother    Cancer Sister    Breast cancer Sister 71   Stroke Maternal Grandmother    Sudden death Father     Past Surgical History:  Procedure Laterality Date   BREAST BIOPSY Left 2010   BREAST LUMPECTOMY Left 2010   BREAST LUMPECTOMY WITH RADIOACTIVE SEED LOCALIZATION Right 09/19/2020   Procedure: RIGHT BREAST LUMPECTOMY WITH RADIOACTIVE SEED LOCALIZATION;  Surgeon: [name], MD;  Location: [place];  Service: General;  Laterality: Right;   BREAST SURGERY Right 09/19/2020   EYE SURGERY Bilateral    cataract surgery   REPLACEMENT  TOTAL KNEE  03/10/2020   TOTAL KNEE ARTHROPLASTY Left 03/10/2020   Procedure: LEFT TOTAL KNEE ARTHROPLASTY;  Surgeon: Leandrew Koyanagi, MD;  Location: [place];  Service: Orthopedics;  Laterality: Left;   TUBAL LIGATION     Social History   Occupational History   Not on file  Tobacco Use   Smoking status: Never   Smokeless tobacco: Never  Vaping Use   Vaping Use: Never used  Substance and Sexual Activity   Alcohol use: No   Drug use: No   Sexual activity: Not Currently    Birth control/protection: Surgical

## 2022-03-18 ENCOUNTER — Other Ambulatory Visit: Payer: Self-pay

## 2022-03-18 ENCOUNTER — Other Ambulatory Visit: Payer: Medicare Other

## 2022-03-18 ENCOUNTER — Telehealth: Payer: Self-pay | Admitting: Nurse Practitioner

## 2022-03-18 DIAGNOSIS — D509 Iron deficiency anemia, unspecified: Secondary | ICD-10-CM

## 2022-03-18 NOTE — Telephone Encounter (Signed)
Called & left pt a VM, adv to call back

## 2022-03-18 NOTE — Telephone Encounter (Signed)
Called & spoke w/ pt

## 2022-03-18 NOTE — Progress Notes (Signed)
iron

## 2022-03-18 NOTE — Telephone Encounter (Signed)
Pt stated she went to [facility] to get lab . Pt stated they did not do her lab work and [facility] tried to reach out to [place] but no one answered. Please call the pt

## 2022-03-18 NOTE — Telephone Encounter (Signed)
Advised iron labs have been placed and she can go to the [facility] lab to have them drawn

## 2022-03-22 ENCOUNTER — Other Ambulatory Visit: Payer: Medicare Other

## 2022-03-22 DIAGNOSIS — D509 Iron deficiency anemia, unspecified: Secondary | ICD-10-CM | POA: Diagnosis not present

## 2022-03-23 LAB — IRON,TIBC AND FERRITIN PANEL
%SAT: 10 % (calc) — ABNORMAL LOW (ref 16–45)
Ferritin: 14 ng/mL — ABNORMAL LOW (ref 16–288)
Iron: 36 ug/dL — ABNORMAL LOW (ref 45–160)
TIBC: 372 mcg/dL (calc) (ref 250–450)

## 2022-07-08 ENCOUNTER — Encounter: Payer: Self-pay | Admitting: Hematology and Oncology

## 2022-07-08 ENCOUNTER — Telehealth: Payer: Self-pay

## 2022-07-08 NOTE — Progress Notes (Signed)
Care Management & Coordination Services Pharmacy Team  Reason for Encounter: Hypertension  Contacted patient on 07/08/2022 to discuss hypertension disease state.   Recent office visits:  None ID  Recent consult visits:  03/09/2022 Dr. Erlinda Hong MD (Orthopedics) No Medication Changes noted  Hospital visits:  None in previous 6 months  Medications: Outpatient Encounter Medications as of 07/08/2022  Medication Sig   acetaminophen (TYLENOL) 500 MG tablet Take 500 mg by mouth every 6 (six) hours as needed for moderate pain.   albuterol (VENTOLIN HFA) 108 (90 Base) MCG/ACT inhaler INHALE 1 TO 2 PUFFS INTO THE LUNGS EVERY 6 HOURS AS NEEDED FOR WHEEZING OR SHORTNESS OF BREATH   ammonium lactate (AMLACTIN) 12 % lotion Apply 1 application topically as needed for dry skin.   Apoaequorin (PREVAGEN PO) Take 1 tablet by mouth daily.   APPLE CIDER VINEGAR PO Take 1 capsule by mouth daily.   aspirin EC 81 MG tablet Take 1 tablet (81 mg total) by mouth 2 (two) times daily. To be taken after surgery to prevent blood clots.  Swallow whole.   Cholecalciferol (VITAMIN D3) 125 MCG (5000 UT) TABS Take 5,000 Units by mouth daily.   diphenhydramine-acetaminophen (TYLENOL PM) 25-500 MG TABS tablet Take 2 tablets by mouth at bedtime as needed (sleep/pain).   Garlic (GARLIQUE PO) Take 1 tablet by mouth daily.   Iron, Ferrous Sulfate, 325 (65 Fe) MG TABS Take 325 mg by mouth in the morning and at bedtime.   lisinopril (ZESTRIL) 40 MG tablet Take 1 tablet (40 mg total) by mouth daily.   montelukast (SINGULAIR) 10 MG tablet Take 1 tablet (10 mg total) by mouth at bedtime.   Multiple Vitamin (MULTIVITAMIN WITH MINERALS) TABS tablet Take 1 tablet by mouth daily.   sennosides-docusate sodium (SENOKOT-S) 8.6-50 MG tablet Take 1 tablet by mouth at bedtime.   Turmeric 500 MG CAPS Take 1,000 mg by mouth daily.   vitamin B-12 (CYANOCOBALAMIN) 1000 MCG tablet Take 1,000 mcg by mouth daily.   Facility-Administered Encounter  Medications as of 07/08/2022  Medication   dexamethasone (DECADRON) injection 4 mg    Recent Office Vitals: BP Readings from Last 3 Encounters:  02/17/22 139/88  01/20/22 126/86  12/25/21 124/82   Pulse Readings from Last 3 Encounters:  02/17/22 83  01/20/22 87  12/25/21 88    Wt Readings from Last 3 Encounters:  02/17/22 207 lb 6.4 oz (94.1 kg)  01/20/22 209 lb 3.2 oz (94.9 kg)  12/25/21 208 lb 12.8 oz (94.7 kg)     Kidney Function Lab Results  Component Value Date/Time   CREATININE 1.39 (H) 12/25/2021 11:35 AM   CREATININE 1.05 07/07/2021 09:38 AM   CREATININE 0.77 10/11/2017 10:57 AM   CREATININE 0.77 08/16/2017 10:33 AM   CREATININE 0.88 06/22/2013 03:06 PM   GFR 37.35 (L) 12/25/2021 11:35 AM   GFRNONAA 15 (L) 03/14/2020 03:05 PM   GFRNONAA >60 10/11/2017 10:57 AM   GFRAA 17 (L) 03/14/2020 03:05 PM   GFRAA >60 10/11/2017 10:57 AM       Latest Ref Rng & Units 12/25/2021   11:35 AM 07/07/2021    9:38 AM 03/26/2021    8:42 AM  BMP  Glucose 70 - 99 mg/dL 90  104  94   BUN 6 - 23 mg/dL '28  14  22   '$ Creatinine 0.40 - 1.20 mg/dL 1.39  1.05  1.10   Sodium 135 - 145 mEq/L 134  139  138   Potassium 3.5 - 5.1 mEq/L  4.9  4.4  4.7   Chloride 96 - 112 mEq/L 98  100  101   CO2 19 - 32 mEq/L '26  29  28   '$ Calcium 8.4 - 10.5 mg/dL 10.0  9.3  9.5      Current antihypertensive regimen:  Lisinopril 40 mg daily    Patient verbally confirms she is taking the above medications as directed. Yes  How often are you checking your Blood Pressure? infrequently  she checks her blood pressure in the morning before taking her medication.  Current home BP readings:  Patient states she is unsure what her blood pressure has been ranging around, but reports it has been "good".   Wrist or arm cuff: Patient reports she use a arm cuff to check her blood pressure.  Caffeine intake:None ID  Salt intake: Patient states she limits her salt intake   Any readings above 180/120?  No    What recent interventions/DTPs have been made by any provider to improve Blood Pressure control since last CPP Visit: None ID  Any recent hospitalizations or ED visits since last visit with CPP? No  What diet changes have been made to improve Blood Pressure Control?  Patient denies any changes to her diet.   What exercise is being done to improve your Blood Pressure Control?  Patient reports she exercise a few days through out the week.   Adherence Review: Is the patient currently on ACE/ARB medication? Yes Does the patient have >5 day gap between last estimated fill dates? Yes  Star Rating Drugs:  Medication: Lisinopril 40 mg Last Fill: 01/11/2022 Day Supply 90 at [facility].  Care Gaps: Influenza Vaccine Fecal DNA (Cologuard) COVID-19 Vaccine AWV Shingrix Vaccine Foot Exam Hemoglobin A1C  [name] Clinical Pharmacist Assistant [phone]

## 2022-07-19 ENCOUNTER — Other Ambulatory Visit: Payer: Self-pay | Admitting: Nurse Practitioner

## 2022-07-19 DIAGNOSIS — I10 Essential (primary) hypertension: Secondary | ICD-10-CM

## 2022-07-19 NOTE — Telephone Encounter (Signed)
Chart supports Rx Last OV: 01/2022 Next OV: 07/2022

## 2022-07-21 ENCOUNTER — Encounter: Payer: Self-pay | Admitting: Hematology and Oncology

## 2022-07-23 ENCOUNTER — Other Ambulatory Visit: Payer: Self-pay | Admitting: Nurse Practitioner

## 2022-07-23 DIAGNOSIS — J302 Other seasonal allergic rhinitis: Secondary | ICD-10-CM

## 2022-07-23 NOTE — Telephone Encounter (Signed)
Chart supports Rx Last OV: 01/2022 Next OV: 07/2022

## 2022-07-26 LAB — FECAL OCCULT BLOOD, GUAIAC: Fecal Occult Blood: NEGATIVE

## 2022-07-28 DIAGNOSIS — H04123 Dry eye syndrome of bilateral lacrimal glands: Secondary | ICD-10-CM | POA: Diagnosis not present

## 2022-07-28 DIAGNOSIS — E119 Type 2 diabetes mellitus without complications: Secondary | ICD-10-CM | POA: Diagnosis not present

## 2022-07-28 DIAGNOSIS — Z961 Presence of intraocular lens: Secondary | ICD-10-CM | POA: Diagnosis not present

## 2022-07-28 DIAGNOSIS — H26491 Other secondary cataract, right eye: Secondary | ICD-10-CM | POA: Diagnosis not present

## 2022-07-28 DIAGNOSIS — H43813 Vitreous degeneration, bilateral: Secondary | ICD-10-CM | POA: Diagnosis not present

## 2022-07-28 LAB — HM DIABETES EYE EXAM

## 2022-07-30 ENCOUNTER — Other Ambulatory Visit: Payer: Self-pay

## 2022-07-30 ENCOUNTER — Ambulatory Visit (INDEPENDENT_AMBULATORY_CARE_PROVIDER_SITE_OTHER): Payer: 59

## 2022-07-30 VITALS — Ht 62.0 in | Wt 214.0 lb

## 2022-07-30 DIAGNOSIS — Z0000 Encounter for general adult medical examination without abnormal findings: Secondary | ICD-10-CM

## 2022-07-30 NOTE — Patient Instructions (Signed)
Penny Gibson , Thank you for taking time to come for your Medicare Wellness Visit. I appreciate your ongoing commitment to your health goals. Please review the following plan we discussed and let me know if I can assist you in the future.   These are the goals we discussed:  Goals      DIET - INCREASE WATER INTAKE     Patient Stated     07/30/2022, wants to walk more     Track and Manage My Blood Pressure-Hypertension     Timeframe:  Long-Range Goal Priority:  High Start Date: 09/18/21                            Expected End Date: 09/19/22                      Follow Up within 90 days   - check blood pressure weekly    Why is this important?   You won't feel high blood pressure, but it can still hurt your blood vessels.  High blood pressure can cause heart or kidney problems. It can also cause a stroke.  Making lifestyle changes like losing a little weight or eating less salt will help.  Checking your blood pressure at home and at different times of the day can help to control blood pressure.  If the doctor prescribes medicine remember to take it the way the doctor ordered.  Call the office if you cannot afford the medicine or if there are questions about it.     Notes:         This is a list of the screening recommended for you and due dates:  Health Maintenance  Topic Date Due   Zoster (Shingles) Vaccine (1 of 2) Never done   Cologuard (Stool DNA test)  10/06/2019   Flu Shot  01/19/2022   COVID-19 Vaccine (4 - 2023-24 season) 02/19/2022   Complete foot exam   04/20/2022   Hemoglobin A1C  06/27/2022   Yearly kidney function blood test for diabetes  12/26/2022   Yearly kidney health urinalysis for diabetes  01/21/2023   DTaP/Tdap/Td vaccine (2 - Td or Tdap) 06/23/2023   Eye exam for diabetics  07/29/2023   Medicare Annual Wellness Visit  07/31/2023   Pneumonia Vaccine  Completed   DEXA scan (bone density measurement)  Completed   Hepatitis C Screening: USPSTF Recommendation  to screen - Ages 54-79 yo.  Completed   HPV Vaccine  Aged Out    Advanced directives: Please bring a copy of your POA (Power of [place]) and/or Living Will to your next appointment.   Conditions/risks identified: none  Next appointment: Follow up in one year for your annual wellness visit    Preventive Care 65 Years and Older, Female Preventive care refers to lifestyle choices and visits with your health care provider that can promote health and wellness. What does preventive care include? A yearly physical exam. This is also called an annual well check. Dental exams once or twice a year. Routine eye exams. Ask your health care provider how often you should have your eyes checked. Personal lifestyle choices, including: Daily care of your teeth and gums. Regular physical activity. Eating a healthy diet. Avoiding tobacco and drug use. Limiting alcohol use. Practicing safe sex. Taking low-dose aspirin every day. Taking vitamin and mineral supplements as recommended by your health care provider. What happens during an annual well check? The  services and screenings done by your health care provider during your annual well check will depend on your age, overall health, lifestyle risk factors, and family history of disease. Counseling  Your health care provider may ask you questions about your: Alcohol use. Tobacco use. Drug use. Emotional well-being. Home and relationship well-being. Sexual activity. Eating habits. History of falls. Memory and ability to understand (cognition). Work and work Statistician. Reproductive health. Screening  You may have the following tests or measurements: Height, weight, and BMI. Blood pressure. Lipid and cholesterol levels. These may be checked every 5 years, or more frequently if you are over 29 years old. Skin check. Lung cancer screening. You may have this screening every year starting at age 33 if you have a 30-pack-year history of smoking  and currently smoke or have quit within the past 15 years. Fecal occult blood test (FOBT) of the stool. You may have this test every year starting at age 24. Flexible sigmoidoscopy or colonoscopy. You may have a sigmoidoscopy every 5 years or a colonoscopy every 10 years starting at age 27. Hepatitis C blood test. Hepatitis B blood test. Sexually transmitted disease (STD) testing. Diabetes screening. This is done by checking your blood sugar (glucose) after you have not eaten for a while (fasting). You may have this done every 1-3 years. Bone density scan. This is done to screen for osteoporosis. You may have this done starting at age 18. Mammogram. This may be done every 1-2 years. Talk to your health care provider about how often you should have regular mammograms. Talk with your health care provider about your test results, treatment options, and if necessary, the need for more tests. Vaccines  Your health care provider may recommend certain vaccines, such as: Influenza vaccine. This is recommended every year. Tetanus, diphtheria, and acellular pertussis (Tdap, Td) vaccine. You may need a Td booster every 10 years. Zoster vaccine. You may need this after age 68. Pneumococcal 13-valent conjugate (PCV13) vaccine. One dose is recommended after age 67. Pneumococcal polysaccharide (PPSV23) vaccine. One dose is recommended after age 34. Talk to your health care provider about which screenings and vaccines you need and how often you need them. This information is not intended to replace advice given to you by your health care provider. Make sure you discuss any questions you have with your health care provider. Document Released: 07/04/2015 Document Revised: 02/25/2016 Document Reviewed: 04/08/2015 Elsevier Interactive Patient Education  2017 [place] Prevention in the Home Falls can cause injuries. They can happen to people of all ages. There are many things you can do to make your  home safe and to help prevent falls. What can I do on the outside of my home? Regularly fix the edges of walkways and driveways and fix any cracks. Remove anything that might make you trip as you walk through a door, such as a raised step or threshold. Trim any bushes or trees on the path to your home. Use bright outdoor lighting. Clear any walking paths of anything that might make someone trip, such as rocks or tools. Regularly check to see if handrails are loose or broken. Make sure that both sides of any steps have handrails. Any raised decks and porches should have guardrails on the edges. Have any leaves, snow, or ice cleared regularly. Use sand or salt on walking paths during winter. Clean up any spills in your garage right away. This includes oil or grease spills. What can I do in the bathroom?  Use night lights. Install grab bars by the toilet and in the tub and shower. Do not use towel bars as grab bars. Use non-skid mats or decals in the tub or shower. If you need to sit down in the shower, use a plastic, non-slip stool. Keep the floor dry. Clean up any water that spills on the floor as soon as it happens. Remove soap buildup in the tub or shower regularly. Attach bath mats securely with double-sided non-slip rug tape. Do not have throw rugs and other things on the floor that can make you trip. What can I do in the bedroom? Use night lights. Make sure that you have a light by your bed that is easy to reach. Do not use any sheets or blankets that are too big for your bed. They should not hang down onto the floor. Have a firm chair that has side arms. You can use this for support while you get dressed. Do not have throw rugs and other things on the floor that can make you trip. What can I do in the kitchen? Clean up any spills right away. Avoid walking on wet floors. Keep items that you use a lot in easy-to-reach places. If you need to reach something above you, use a strong  step stool that has a grab bar. Keep electrical cords out of the way. Do not use floor polish or wax that makes floors slippery. If you must use wax, use non-skid floor wax. Do not have throw rugs and other things on the floor that can make you trip. What can I do with my stairs? Do not leave any items on the stairs. Make sure that there are handrails on both sides of the stairs and use them. Fix handrails that are broken or loose. Make sure that handrails are as long as the stairways. Check any carpeting to make sure that it is firmly attached to the stairs. Fix any carpet that is loose or worn. Avoid having throw rugs at the top or bottom of the stairs. If you do have throw rugs, attach them to the floor with carpet tape. Make sure that you have a light switch at the top of the stairs and the bottom of the stairs. If you do not have them, ask someone to add them for you. What else can I do to help prevent falls? Wear shoes that: Do not have high heels. Have rubber bottoms. Are comfortable and fit you well. Are closed at the toe. Do not wear sandals. If you use a stepladder: Make sure that it is fully opened. Do not climb a closed stepladder. Make sure that both sides of the stepladder are locked into place. Ask someone to hold it for you, if possible. Clearly mark and make sure that you can see: Any grab bars or handrails. First and last steps. Where the edge of each step is. Use tools that help you move around (mobility aids) if they are needed. These include: Canes. Walkers. Scooters. Crutches. Turn on the lights when you go into a dark area. Replace any light bulbs as soon as they burn out. Set up your furniture so you have a clear path. Avoid moving your furniture around. If any of your floors are uneven, fix them. If there are any pets around you, be aware of where they are. Review your medicines with your doctor. Some medicines can make you feel dizzy. This can increase your  chance of falling. Ask your doctor what other  things that you can do to help prevent falls. This information is not intended to replace advice given to you by your health care provider. Make sure you discuss any questions you have with your health care provider. Document Released: 04/03/2009 Document Revised: 11/13/2015 Document Reviewed: 07/12/2014 Elsevier Interactive Patient Education  2017 [employer].

## 2022-07-30 NOTE — Progress Notes (Signed)
I connected with Penny Gibson today by telephone and verified that I am speaking with the correct person using two identifiers. Location patient: home Location provider: work Persons participating in the virtual visit: Penny Gibson, Glenna Durand LPN.   I discussed the limitations, risks, security and privacy concerns of performing an evaluation and management service by telephone and the availability of in person appointments. I also discussed with the patient that there may be a patient responsible charge related to this service. The patient expressed understanding and verbally consented to this telephonic visit.    Interactive audio and video telecommunications were attempted between this provider and patient, however failed, due to patient having technical difficulties OR patient did not have access to video capability.  We continued and completed visit with audio only.     Vital signs may be patient reported or missing.  Subjective:   Penny Gibson is a 77 y.o. female who presents for Medicare Annual (Subsequent) preventive examination.  Review of Systems     Cardiac Risk Factors include: advanced age (>28mn, >>46women);diabetes mellitus;dyslipidemia;hypertension;obesity (BMI >30kg/m2)     Objective:    Today's Vitals   07/30/22 0956  Weight: 214 lb (97.1 kg)  Height: 5' 2"$  (1.575 m)   Body mass index is 39.14 kg/m.     07/30/2022   10:01 AM 03/06/2021   11:00 AM 11/20/2020   11:00 AM 10/06/2020   11:18 AM 09/19/2020    7:28 AM 09/11/2020   12:19 PM 09/09/2020    9:43 AM  Advanced Directives  Does Patient Have a Medical Advance Directive? Yes No No No No No No  [place]        Copy of [place]? No - copy requested        Would patient like information on creating a medical advance directive?  No - Patient declined No - Patient declined  No - Patient declined No - Patient declined No  - Patient declined    Current Medications (verified) Outpatient Encounter Medications as of 07/30/2022  Medication Sig   acetaminophen (TYLENOL) 500 MG tablet Take 500 mg by mouth every 6 (six) hours as needed for moderate pain.   albuterol (VENTOLIN HFA) 108 (90 Base) MCG/ACT inhaler INHALE 1 TO 2 PUFFS INTO THE LUNGS EVERY 6 HOURS AS NEEDED FOR WHEEZING OR SHORTNESS OF BREATH   ammonium lactate (AMLACTIN) 12 % lotion Apply 1 application topically as needed for dry skin.   Apoaequorin (PREVAGEN PO) Take 1 tablet by mouth daily.   APPLE CIDER VINEGAR PO Take 1 capsule by mouth daily.   aspirin EC 81 MG tablet Take 1 tablet (81 mg total) by mouth 2 (two) times daily. To be taken after surgery to prevent blood clots.  Swallow whole.   Cholecalciferol (VITAMIN D3) 125 MCG (5000 UT) TABS Take 5,000 Units by mouth daily.   diphenhydramine-acetaminophen (TYLENOL PM) 25-500 MG TABS tablet Take 2 tablets by mouth at bedtime as needed (sleep/pain).   Garlic (GARLIQUE PO) Take 1 tablet by mouth daily.   Iron, Ferrous Sulfate, 325 (65 Fe) MG TABS Take 325 mg by mouth in the morning and at bedtime.   lisinopril (ZESTRIL) 40 MG tablet TAKE 1 TABLET(40 MG) BY MOUTH DAILY   montelukast (SINGULAIR) 10 MG tablet TAKE 1 TABLET(10 MG) BY MOUTH AT BEDTIME   Multiple Vitamin (MULTIVITAMIN WITH MINERALS) TABS tablet Take 1 tablet by mouth daily.   sennosides-docusate sodium (  SENOKOT-S) 8.6-50 MG tablet Take 1 tablet by mouth at bedtime.   Turmeric 500 MG CAPS Take 1,000 mg by mouth daily.   vitamin B-12 (CYANOCOBALAMIN) 1000 MCG tablet Take 1,000 mcg by mouth daily.   Facility-Administered Encounter Medications as of 07/30/2022  Medication   dexamethasone (DECADRON) injection 4 mg    Allergies (verified) Feraheme [ferumoxytol], Penicillins, Allopurinol, Atorvastatin, and Citric acid   History: Past Medical History:  Diagnosis Date   Anemia    Arthritis    Asthma    Constipation    Diabetes mellitus  without complication (HCC)    Dyspnea    Hyperlipidemia    Hypertension    Insomnia    Joint pain    Knee pain    Lactose intolerance    Lower extremity edema    Multiple food allergies    Osteoarthritis    Sinus problem    Past Surgical History:  Procedure Laterality Date   BREAST BIOPSY Left 2010   BREAST LUMPECTOMY Left 2010   BREAST LUMPECTOMY WITH RADIOACTIVE SEED LOCALIZATION Right 09/19/2020   Procedure: RIGHT BREAST LUMPECTOMY WITH RADIOACTIVE SEED LOCALIZATION;  Surgeon: [name], MD;  Location: [facility];  Service: General;  Laterality: Right;   BREAST SURGERY Right 09/19/2020   EYE SURGERY Bilateral    cataract surgery   REPLACEMENT TOTAL KNEE  03/10/2020   TOTAL KNEE ARTHROPLASTY Left 03/10/2020   Procedure: LEFT TOTAL KNEE ARTHROPLASTY;  Surgeon: Leandrew Koyanagi, MD;  Location: [facility];  Service: Orthopedics;  Laterality: Left;   TUBAL LIGATION     Family History  Problem Relation Age of Onset   Heart attack Mother    Heart disease Mother    Sudden death Mother    Obesity Mother    Cancer Sister    Breast cancer Sister 30   Stroke Maternal Grandmother    Sudden death Father    Social History   Socioeconomic History   Marital status: Divorced    Spouse name: Not on file   Number of children: 2   Years of education: Not on file   Highest education level: Not on file  Occupational History   Not on file  Tobacco Use   Smoking status: Never   Smokeless tobacco: Never  Vaping Use   Vaping Use: Never used  Substance and Sexual Activity   Alcohol use: No   Drug use: No   Sexual activity: Not Currently    Birth control/protection: Surgical  Other Topics Concern   Not on file  Social History Narrative   Not on file   Social Determinants of Health   Financial Resource Strain: Low Risk  (07/30/2022)   Overall Financial Resource Strain (CARDIA)    Difficulty of Paying Living Expenses: Not hard at all  Food  Insecurity: No Food Insecurity (07/30/2022)   Hunger Vital Sign    Worried About Running Out of Food in the Last Year: Never true    [place] in the Last Year: Never true  Transportation Needs: No Transportation Needs (07/30/2022)   PRAPARE - Hydrologist (Medical): No    Lack of Transportation (Non-Medical): No  Physical Activity: Inactive (07/30/2022)   Exercise Vital Sign    Days of Exercise per Week: 0 days    Minutes of Exercise per Session: 0 min  Stress: No Stress Concern Present (07/30/2022)   Rebecca  Feeling of Stress : Not at all  Social Connections: Moderately Isolated (06/04/2021)   Social Connection and Isolation Panel [NHANES]    Frequency of Communication with Friends and Family: More than three times a week    Frequency of Social Gatherings with Friends and Family: Three times a week    Attends Religious Services: 1 to 4 times per year    Active Member of Clubs or Organizations: No    Attends Music therapist: Never    Marital Status: Divorced    Tobacco Counseling Counseling given: Not Answered   Clinical Intake:  Pre-visit preparation completed: Yes  Pain : No/denies pain     Nutritional Status: BMI > 30  Obese Nutritional Risks: None Diabetes: Yes  How often do you need to have someone help you when you read instructions, pamphlets, or other written materials from your doctor or pharmacy?: 1 - Never  Diabetic? Yes  Nutrition Risk Assessment:  Has the patient had any N/V/D within the last 2 months?  No  Does the patient have any non-healing wounds?  No  Has the patient had any unintentional weight loss or weight gain?  No   Diabetes:  Is the patient diabetic?  Yes  If diabetic, was a CBG obtained today?  No  Did the patient bring in their glucometer from home?  No  How often do you monitor your CBG's? Does not.   Financial Strains  and Diabetes Management:  Are you having any financial strains with the device, your supplies or your medication? Yes .  Does the patient want to be seen by Chronic Care Management for management of their diabetes?  No  Would the patient like to be referred to a Nutritionist or for Diabetic Management?  No   Diabetic Exams:  Diabetic Eye Exam: Completed 07/28/2022 Diabetic Foot Exam: Overdue, Pt has been advised about the importance in completing this exam. Pt is scheduled for diabetic foot exam on next appointment.      Information entered by :: NAllen LPN   Activities of Daily Living    07/30/2022   10:02 AM  In your present state of health, do you have any difficulty performing the following activities:  Hearing? 1  Comment sounds like hearing through a seashell  Vision? 0  Difficulty concentrating or making decisions? 0  Walking or climbing stairs? 1  Dressing or bathing? 0  Doing errands, shopping? 0  Preparing Food and eating ? N  Using the Toilet? N  In the past six months, have you accidently leaked urine? Y  Comment with a hard sneeze  Do you have problems with loss of bowel control? N  Managing your Medications? N  Managing your Finances? N  Housekeeping or managing your Housekeeping? N    Patient Care Team: [name], [name], NP as PCP - General (Internal Medicine) [name], MD as Consulting Physician (Hematology) [name], MD as Consulting Physician (General Surgery) [name], MD as Consulting Physician (Radiation Oncology) [name] [name], NP as Nurse Practitioner (Hematology and Oncology) [name], RN as Registered Nurse [name], [facility] (Pharmacist)  Indicate any recent Medical Services you may have received from other than [facility] providers in the past year (date may be approximate).     Assessment:   This is a routine wellness examination for [place].  Hearing/Vision screen Vision Screening - Comments:: Regular eye  exams, [facility]  Dietary issues and exercise activities discussed: Current Exercise Habits: The patient  does not participate in regular exercise at present   Goals Addressed             This Visit's Progress    Patient Stated       07/30/2022, wants to walk more       Depression Screen    07/30/2022   10:02 AM 06/04/2021   11:46 AM 03/06/2021   11:00 AM 12/27/2019   10:36 AM 07/11/2019   10:43 AM 11/04/2017   10:12 AM 06/07/2017   10:22 AM  PHQ 2/9 Scores  PHQ - 2 Score 0 0 1 6 0 0 0  PHQ- 9 Score   2 16       Fall Risk    07/30/2022   10:02 AM 07/07/2021    9:09 AM 03/06/2021   11:00 AM 08/15/2019   10:14 AM 07/11/2019   10:43 AM  Fall Risk   Falls in the past year? 0 0 0 0 0  Number falls in past yr: 0 0 0 0   Injury with Fall? 0 0 0 0   Risk for fall due to : Medication side effect No Fall Risks No Fall Risks    Follow up Falls prevention discussed;Education provided;Falls evaluation completed Falls evaluation completed Falls evaluation completed Education provided;Falls prevention discussed     FALL RISK PREVENTION PERTAINING TO THE HOME:  Any stairs in or around the home? Yes  If so, are there any without handrails? No  Home free of loose throw rugs in walkways, pet beds, electrical cords, etc? Yes  Adequate lighting in your home to reduce risk of falls? Yes   ASSISTIVE DEVICES UTILIZED TO PREVENT FALLS:  Life alert? No  Use of a cane, walker or w/c? No  Grab bars in the bathroom? No  Shower chair or bench in shower? No  Elevated toilet seat or a handicapped toilet? Yes   TIMED UP AND GO:  Was the test performed? No .      Cognitive Function:    01/31/2014    9:26 AM  MMSE - Mini Mental State Exam  Orientation to time 5  Orientation to Place 5  Registration 3  Attention/ Calculation 5  Recall 3  Language- name 2 objects 2  Language- repeat 1  Language- follow 3 step command 3  Language- read & follow direction 1  Write a sentence 1   Copy design 1  Total score 30        07/30/2022   10:05 AM 03/06/2021   11:25 AM  6CIT Screen  What Year? 0 points 0 points  What month? 0 points 0 points  What time? 0 points 0 points  Count back from 20 0 points 0 points  Months in reverse 0 points 0 points  Repeat phrase 0 points 0 points  Total Score 0 points 0 points    Immunizations Immunization History  Administered Date(s) Administered   Fluad Quad(high Dose 65+) 03/26/2021   Influenza, High Dose Seasonal PF 03/19/2016, 04/12/2017, 04/21/2018   Influenza,inj,Quad PF,6+ Mos 07/26/2014   Influenza-Unspecified 03/21/2013, 04/21/2018, 04/01/2020   Moderna Sars-Covid-2 Vaccination 08/04/2019, 09/02/2019, 05/01/2020   Pneumococcal Conjugate-13 07/26/2014   Pneumococcal Polysaccharide-23 09/20/2013   Tdap 06/22/2013    TDAP status: Up to date  Flu Vaccine status: Up to date  Pneumococcal vaccine status: Up to date  Covid-19 vaccine status: Completed vaccines  Qualifies for Shingles Vaccine? Yes   Zostavax completed No   Shingrix Completed?: No.    Education has been  provided regarding the importance of this vaccine. Patient has been advised to call insurance company to determine out of pocket expense if they have not yet received this vaccine. Advised may also receive vaccine at local pharmacy or Health Dept. Verbalized acceptance and understanding.  Screening Tests Health Maintenance  Topic Date Due   Zoster Vaccines- Shingrix (1 of 2) Never done   Fecal DNA (Cologuard)  10/06/2019   Medicare Annual Wellness (AWV)  08/14/2020   INFLUENZA VACCINE  01/19/2022   COVID-19 Vaccine (4 - 2023-24 season) 02/19/2022   FOOT EXAM  04/20/2022   HEMOGLOBIN A1C  06/27/2022   Diabetic kidney evaluation - eGFR measurement  12/26/2022   Diabetic kidney evaluation - Urine ACR  01/21/2023   DTaP/Tdap/Td (2 - Td or Tdap) 06/23/2023   OPHTHALMOLOGY EXAM  07/29/2023   Pneumonia Vaccine 66+ Years old  Completed   DEXA SCAN   Completed   Hepatitis C Screening  Completed   HPV VACCINES  Aged Out    Health Maintenance  Health Maintenance Due  Topic Date Due   Zoster Vaccines- Shingrix (1 of 2) Never done   Fecal DNA (Cologuard)  10/06/2019   Medicare Annual Wellness (AWV)  08/14/2020   INFLUENZA VACCINE  01/19/2022   COVID-19 Vaccine (4 - 2023-24 season) 02/19/2022   FOOT EXAM  04/20/2022   HEMOGLOBIN A1C  06/27/2022    Colorectal cancer screening: No longer required.   Mammogram status: No longer required due to age.  Bone Density status: Completed 03/16/2019.   Lung Cancer Screening: (Low Dose CT Chest recommended if Age 74-80 years, 30 pack-year currently smoking OR have quit w/in 15years.) does not qualify.   Lung Cancer Screening Referral: no  Additional Screening:  Hepatitis C Screening: does qualify; Completed 09/24/2016  Vision Screening: Recommended annual ophthalmology exams for early detection of glaucoma and other disorders of the eye. Is the patient up to date with their annual eye exam?  Yes  Who is the provider or what is the name of the office in which the patient attends annual eye exams? [facility] If pt is not established with a provider, would they like to be referred to a provider to establish care? No .   Dental Screening: Recommended annual dental exams for proper oral hygiene  Community Resource Referral / Chronic Care Management: CRR required this visit?  No   CCM required this visit?  No      Plan:     I have personally reviewed and noted the following in the patient's chart:   Medical and social history Use of alcohol, tobacco or illicit drugs  Current medications and supplements including opioid prescriptions. Patient is not currently taking opioid prescriptions. Functional ability and status Nutritional status Physical activity Advanced directives List of other physicians Hospitalizations, surgeries, and ER visits in previous 12  months Vitals Screenings to include cognitive, depression, and falls Referrals and appointments  In addition, I have reviewed and discussed with patient certain preventive protocols, quality metrics, and best practice recommendations. A written personalized care plan for preventive services as well as general preventive health recommendations were provided to patient.     Kellie Simmering, LPN   [id number]   Nurse Notes: none  Due to this being a virtual visit, the after visit summary with patients personalized plan was offered to patient via mail or my-chart.  to pick up at office at next visit

## 2022-08-04 ENCOUNTER — Other Ambulatory Visit: Payer: Self-pay

## 2022-08-04 ENCOUNTER — Ambulatory Visit (INDEPENDENT_AMBULATORY_CARE_PROVIDER_SITE_OTHER): Payer: 59 | Admitting: Nurse Practitioner

## 2022-08-04 ENCOUNTER — Encounter: Payer: Self-pay | Admitting: Nurse Practitioner

## 2022-08-04 VITALS — BP 140/104 | HR 89 | Temp 98.4°F | Resp 16 | Ht 61.0 in | Wt 212.0 lb

## 2022-08-04 DIAGNOSIS — E785 Hyperlipidemia, unspecified: Secondary | ICD-10-CM | POA: Diagnosis not present

## 2022-08-04 DIAGNOSIS — N1831 Chronic kidney disease, stage 3a: Secondary | ICD-10-CM | POA: Diagnosis not present

## 2022-08-04 DIAGNOSIS — J4520 Mild intermittent asthma, uncomplicated: Secondary | ICD-10-CM

## 2022-08-04 DIAGNOSIS — D509 Iron deficiency anemia, unspecified: Secondary | ICD-10-CM

## 2022-08-04 DIAGNOSIS — E1122 Type 2 diabetes mellitus with diabetic chronic kidney disease: Secondary | ICD-10-CM | POA: Diagnosis not present

## 2022-08-04 DIAGNOSIS — H9311 Tinnitus, right ear: Secondary | ICD-10-CM | POA: Diagnosis not present

## 2022-08-04 DIAGNOSIS — E790 Hyperuricemia without signs of inflammatory arthritis and tophaceous disease: Secondary | ICD-10-CM

## 2022-08-04 DIAGNOSIS — Z780 Asymptomatic menopausal state: Secondary | ICD-10-CM

## 2022-08-04 DIAGNOSIS — E1169 Type 2 diabetes mellitus with other specified complication: Secondary | ICD-10-CM

## 2022-08-04 DIAGNOSIS — I10 Essential (primary) hypertension: Secondary | ICD-10-CM

## 2022-08-04 DIAGNOSIS — Z853 Personal history of malignant neoplasm of breast: Secondary | ICD-10-CM

## 2022-08-04 DIAGNOSIS — Z1231 Encounter for screening mammogram for malignant neoplasm of breast: Secondary | ICD-10-CM | POA: Diagnosis not present

## 2022-08-04 DIAGNOSIS — Z9889 Other specified postprocedural states: Secondary | ICD-10-CM | POA: Diagnosis not present

## 2022-08-04 MED ORDER — ALBUTEROL SULFATE HFA 108 (90 BASE) MCG/ACT IN AERS: 1.0000 | INHALATION_SPRAY | Freq: Four times a day (QID) | RESPIRATORY_TRACT | 0 refills | Status: DC | PRN

## 2022-08-04 MED ORDER — AMLODIPINE BESYLATE 5 MG PO TABS: 5.0000 mg | ORAL_TABLET | Freq: Every evening | ORAL | 1 refills | Status: DC

## 2022-08-04 NOTE — Assessment & Plan Note (Addendum)
No joint pain or swelling since 06/2021. She was not able to tolerate allopurinol (nausea) Repeat uric acid and BMP today

## 2022-08-04 NOTE — Assessment & Plan Note (Signed)
Denies any melena or hematuria Negative ifob 06/2022 Current use of oral iron supplement with colace Repeat cbc and iron panel

## 2022-08-04 NOTE — Assessment & Plan Note (Addendum)
BP not at goal with lisinopril 82m. BP Readings from Last 3 Encounters:  08/04/22 (!) 140/104  02/17/22 139/88  01/20/22 126/86    Add amlodipine 576min PM Maintain lisinopril dose F/up in 23m37month

## 2022-08-04 NOTE — Patient Instructions (Addendum)
Stop claritin Start xyzal 1tab daily Stop tumeric, garlique and apple cider supplement Avoid skipping meals. Call office if no improvement in epigastric pain in 1week. Go to [facility] lab for blood draw

## 2022-08-04 NOTE — Assessment & Plan Note (Signed)
Repeat lipid panel ?

## 2022-08-04 NOTE — Progress Notes (Signed)
Established Patient Visit  Patient: Penny Gibson   DOB: [birth year]   77 y.o. Female  MRN: [id number] Visit Date: 08/04/2022  Subjective:    Chief Complaint  Patient presents with   Medical Management of Chronic Issues    HTN. Chol & DM  -  Pt is having issues with her right ear. Needs refill - Albuterol,  shingles vaccine   HPI History of breast cancer in female Diagnosed with right upper outer quadrant 07/2020, s/p lumpectomy with radioactive seed implant. She completed radiation therapy 02/2021. She declined use of antiesterogen therapy. Last mammogram 09/2020 She is part breast survivorship program.  Advised about importance of repeat mammogram. 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 She was not able to tolerate allopurinol (nausea) Repeat uric acid and BMP today  Iron deficiency anemia Denies any melena or hematuria Negative ifob 06/2022 Current use of oral iron supplement with colace Repeat cbc and iron panel  Essential hypertension BP not at goal with lisinopril 38m. BP Readings from Last 3 Encounters:  08/04/22 (!) 140/104  02/17/22 139/88  01/20/22 126/86    Add amlodipine 560min PM Maintain lisinopril dose F/up in 68m85monthype 2 diabetes mellitus with diabetic chronic kidney disease (HCCBig Bayontrolled with diet Negative nephropathy, retinopathy and neuropathy  Repeat hgbA1c and BMP today Encouraged to maintain heart healthy diet and daily exercise F/up in 7mo57monthyperlipidemia associated with type 2 diabetes mellitus (HCC)[place] lipid panel  Reviewed medical, surgical, and social history today  Medications: Outpatient Medications Prior to Visit  Medication Sig   acetaminophen (TYLENOL) 500 MG tablet Take 500 mg by mouth every 6 (six) hours as needed for moderate pain.   ammonium lactate (AMLACTIN) 12 % lotion Apply 1 application topically as needed for  dry skin.   Apoaequorin (PREVAGEN PO) Take 1 tablet by mouth daily.   aspirin EC 81 MG tablet Take 1 tablet (81 mg total) by mouth 2 (two) times daily. To be taken after surgery to prevent blood clots.  Swallow whole.   Cholecalciferol (VITAMIN D3) 125 MCG (5000 UT) TABS Take 5,000 Units by mouth daily.   diphenhydramine-acetaminophen (TYLENOL PM) 25-500 MG TABS tablet Take 2 tablets by mouth at bedtime as needed (sleep/pain).   FLUAD QUADRIVALENT 0.5 ML injection    Iron, Ferrous Sulfate, 325 (65 Fe) MG TABS Take 325 mg by mouth in the morning and at bedtime.   lisinopril (ZESTRIL) 40 MG tablet TAKE 1 TABLET(40 MG) BY MOUTH DAILY   montelukast (SINGULAIR) 10 MG tablet TAKE 1 TABLET(10 MG) BY MOUTH AT BEDTIME   Multiple Vitamin (MULTIVITAMIN WITH MINERALS) TABS tablet Take 1 tablet by mouth daily.   sennosides-docusate sodium (SENOKOT-S) 8.6-50 MG tablet Take 1 tablet by mouth at bedtime.   SPIKEVAX syringe    vitamin B-12 (CYANOCOBALAMIN) 1000 MCG tablet Take 1,000 mcg by mouth daily.   [DISCONTINUED] albuterol (VENTOLIN HFA) 108 (90 Base) MCG/ACT inhaler INHALE 1 TO 2 PUFFS INTO THE LUNGS EVERY 6 HOURS AS NEEDED FOR WHEEZING OR SHORTNESS OF BREATH   [DISCONTINUED] APPLE CIDER VINEGAR PO Take 1 capsule by mouth daily.   [DISCONTINUED] Garlic (GARLIQUE PO) Take 1 tablet by mouth daily.   [DISCONTINUED] Turmeric 500 MG CAPS Take 1,000 mg by mouth daily.   Facility-Administered Medications Prior to Visit  Medication Dose Route Frequency Provider  dexamethasone (DECADRON) injection 4 mg  4 mg Intra-articular Once Lorenda Peck, DPM   Reviewed past medical and social history.   ROS per HPI above      Objective:  BP (!) 140/104 (BP Location: Left Arm, Patient Position: Sitting, Cuff Size: Large)   Pulse 89   Temp 98.4 F (36.9 C) (Temporal)   Resp 16   Ht 5' 1"$  (1.549 m)   Wt 212 lb (96.2 kg)   SpO2 98%   BMI 40.06 kg/m      Physical Exam Vitals and nursing note reviewed.   Constitutional:      Appearance: She is obese.  HENT:     Right Ear: Ear canal and external ear normal. A middle ear effusion is present. There is no impacted cerumen. Tympanic membrane is not injected, scarred, perforated, erythematous or retracted.     Left Ear: Ear canal and external ear normal. A middle ear effusion is present. There is no impacted cerumen. Tympanic membrane is not injected, scarred, perforated, erythematous or retracted.     Ears:     Weber exam findings: Does not lateralize.    Right Rinne: AC > BC.    Left Rinne: AC > BC. Cardiovascular:     Rate and Rhythm: Normal rate and regular rhythm.     Pulses: Normal pulses.     Heart sounds: Normal heart sounds.  Pulmonary:     Effort: Pulmonary effort is normal.     Breath sounds: Normal breath sounds.  Musculoskeletal:     Right lower leg: No edema.     Left lower leg: No edema.  Neurological:     Mental Status: She is alert and oriented to person, place, and time.  Psychiatric:        Mood and Affect: Mood normal.        Behavior: Behavior normal.        Thought Content: Thought content normal.     No results found for any visits on 08/04/22.    Assessment & Plan:    Problem List Items Addressed This Visit       Cardiovascular and Mediastinum   Essential hypertension (Chronic)    BP not at goal with lisinopril 57m. BP Readings from Last 3 Encounters:  08/04/22 (!) 140/104  02/17/22 139/88  01/20/22 126/86    Add amlodipine 523min PM Maintain lisinopril dose F/up in 81m32month   Relevant Medications   amLODipine (NORVASC) 5 MG tablet   Other Relevant Orders   Renal Function Panel     Respiratory   Asthma, mild intermittent   Relevant Medications   albuterol (VENTOLIN HFA) 108 (90 Base) MCG/ACT inhaler     Endocrine   Hyperlipidemia associated with type 2 diabetes mellitus (HCC)    Repeat lipid panel      Relevant Medications   amLODipine (NORVASC) 5 MG tablet   Other Relevant Orders    Lipid panel   Type 2 diabetes mellitus with diabetic chronic kidney disease ([facility]CC[facility]    Controlled with diet Negative nephropathy, retinopathy and neuropathy  Repeat hgbA1c and BMP today Encouraged to maintain heart healthy diet and daily exercise F/up in 6mo30month   Relevant Orders   Renal Function Panel   Hemoglobin A1c     Other   History of breast cancer in female    Diagnosed with right upper outer quadrant 07/2020, s/p lumpectomy with radioactive seed implant. She completed radiation therapy 02/2021. She  declined use of antiesterogen therapy. Last mammogram 09/2020 She is part breast survivorship program.  Advised about importance of repeat mammogram. 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 She was not able to tolerate allopurinol (nausea) Repeat uric acid and BMP today      Relevant Orders   Uric acid   Iron deficiency anemia    Denies any melena or hematuria Negative ifob 06/2022 Current use of oral iron supplement with colace Repeat cbc and iron panel      Relevant Orders   CBC with Differential/Platelet   Iron, TIBC and Ferritin Panel   S/P lumpectomy, right breast   Relevant Orders   MM 3D SCREEN BREAST BILATERAL   Other Visit Diagnoses     Asymptomatic postmenopausal estrogen deficiency       Relevant Orders   DG Bone Density   Tinnitus of right ear       Relevant Orders   Ambulatory referral to Audiology   Breast cancer screening by mammogram       Relevant Orders   MM 3D SCREEN BREAST BILATERAL     Advised to schedule appt with retail pharmacy for Zoster vaccine.  Return in about 4 weeks (around 09/01/2022) for HTN.     [name], NP

## 2022-08-04 NOTE — Assessment & Plan Note (Signed)
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 She completed radiation therapy 02/2021. She declined use of antiesterogen therapy. Last mammogram 09/2020 She is part breast survivorship program.  Advised about importance of repeat mammogram. She agreed

## 2022-08-04 NOTE — Assessment & Plan Note (Signed)
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

## 2022-08-09 ENCOUNTER — Other Ambulatory Visit (INDEPENDENT_AMBULATORY_CARE_PROVIDER_SITE_OTHER): Payer: 59

## 2022-08-09 DIAGNOSIS — D509 Iron deficiency anemia, unspecified: Secondary | ICD-10-CM | POA: Diagnosis not present

## 2022-08-09 DIAGNOSIS — E790 Hyperuricemia without signs of inflammatory arthritis and tophaceous disease: Secondary | ICD-10-CM

## 2022-08-09 DIAGNOSIS — E1169 Type 2 diabetes mellitus with other specified complication: Secondary | ICD-10-CM

## 2022-08-09 DIAGNOSIS — E785 Hyperlipidemia, unspecified: Secondary | ICD-10-CM | POA: Diagnosis not present

## 2022-08-09 DIAGNOSIS — E1122 Type 2 diabetes mellitus with diabetic chronic kidney disease: Secondary | ICD-10-CM | POA: Diagnosis not present

## 2022-08-09 DIAGNOSIS — N1831 Chronic kidney disease, stage 3a: Secondary | ICD-10-CM | POA: Diagnosis not present

## 2022-08-09 DIAGNOSIS — I10 Essential (primary) hypertension: Secondary | ICD-10-CM | POA: Diagnosis not present

## 2022-08-09 LAB — RENAL FUNCTION PANEL
Albumin: 4.1 g/dL (ref 3.5–5.2)
BUN: 23 mg/dL (ref 6–23)
CO2: 27 mEq/L (ref 19–32)
Calcium: 9.6 mg/dL (ref 8.4–10.5)
Chloride: 101 mEq/L (ref 96–112)
Creatinine, Ser: 0.93 mg/dL (ref 0.40–1.20)
GFR: 60.24 mL/min (ref >60.00)
Glucose, Bld: 100 mg/dL — ABNORMAL HIGH (ref 70–99)
Phosphorus: 4.1 mg/dL (ref 2.3–4.6)
Potassium: 4.1 mEq/L (ref 3.5–5.1)
Sodium: 137 mEq/L (ref 135–145)

## 2022-08-09 LAB — CBC WITH DIFFERENTIAL/PLATELET
Basophils Absolute: 0.1 10*3/uL (ref 0.0–0.1)
Basophils Relative: 1.1 % (ref 0.0–3.0)
Eosinophils Absolute: 0.2 10*3/uL (ref 0.0–0.7)
Eosinophils Relative: 1.8 % (ref 0.0–5.0)
HCT: 36.3 % (ref 36.0–46.0)
Hemoglobin: 11.4 g/dL — ABNORMAL LOW (ref 12.0–15.0)
Lymphocytes Relative: 18 % (ref 12.0–46.0)
Lymphs Abs: 1.8 10*3/uL (ref 0.7–4.0)
MCHC: 31.4 g/dL (ref 30.0–36.0)
MCV: 82.2 fl (ref 78.0–100.0)
Monocytes Absolute: 0.6 10*3/uL (ref 0.1–1.0)
Monocytes Relative: 6.3 % (ref 3.0–12.0)
Neutro Abs: 7.4 10*3/uL (ref 1.4–7.7)
Neutrophils Relative %: 72.8 % (ref 43.0–77.0)
Platelets: 286 10*3/uL (ref 150.0–400.0)
RBC: 4.41 Mil/uL (ref 3.87–5.11)
RDW: 18.3 % — ABNORMAL HIGH (ref 11.5–15.5)
WBC: 10.1 10*3/uL (ref 4.0–10.5)

## 2022-08-09 LAB — HEMOGLOBIN A1C: Hgb A1c MFr Bld: 6.4 % (ref 4.6–6.5)

## 2022-08-09 LAB — LIPID PANEL
Cholesterol: 197 mg/dL (ref 0–200)
HDL: 49.2 mg/dL (ref >39.00)
LDL Cholesterol: 117 mg/dL — ABNORMAL HIGH (ref 0–99)
NonHDL: 147.66
Total CHOL/HDL Ratio: 4
Triglycerides: 151 mg/dL — ABNORMAL HIGH (ref 0.0–149.0)
VLDL: 30.2 mg/dL (ref 0.0–40.0)

## 2022-08-09 LAB — URIC ACID: Uric Acid, Serum: 7.3 mg/dL — ABNORMAL HIGH (ref 2.4–7.0)

## 2022-08-10 LAB — IRON,TIBC AND FERRITIN PANEL
%SAT: 9 % (calc) — ABNORMAL LOW (ref 16–45)
Ferritin: 11 ng/mL — ABNORMAL LOW (ref 16–288)
Iron: 38 ug/dL — ABNORMAL LOW (ref 45–160)
TIBC: 410 mcg/dL (calc) (ref 250–450)

## 2022-08-12 ENCOUNTER — Other Ambulatory Visit: Payer: Self-pay | Admitting: Nurse Practitioner

## 2022-08-12 DIAGNOSIS — D509 Iron deficiency anemia, unspecified: Secondary | ICD-10-CM

## 2022-08-13 ENCOUNTER — Telehealth: Payer: Self-pay | Admitting: Nurse Practitioner

## 2022-08-13 DIAGNOSIS — E785 Hyperlipidemia, unspecified: Secondary | ICD-10-CM

## 2022-08-13 MED ORDER — FENOFIBRATE 54 MG PO TABS: 54.0000 mg | ORAL_TABLET | ORAL | 5 refills | Status: DC

## 2022-08-13 MED ORDER — PRAVASTATIN SODIUM 20 MG PO TABS: 20.0000 mg | ORAL_TABLET | Freq: Every day | ORAL | 5 refills | Status: DC

## 2022-08-13 NOTE — Telephone Encounter (Signed)
-----   Message from Lynnea Maizes, [place] sent at 08/13/2022  3:53 PM EST ----- [name],  She is hesitant about the Lipid Clinic and wants to try medication first so she suggested trying Pravastatin.  I made her aware of the reaction she had with Atorvastatin but she still wants to try Pravastatin.   Khadija  ----- Message ----- From: Flossie Buffy, NP Sent: 08/13/2022   2:57 PM EST To: Lynnea Maizes, CMA  I did not prescribe pravastatin. I only entered a referral to lipid clinic.  ----- Message ----- From: Lynnea Maizes, CMA Sent: 08/13/2022   2:27 PM EST To: Flossie Buffy, NP  Earlie Raveling called back and wanted to know if she can take Pravastatin first before trying the Lipid clinic.  Theodis Shove

## 2022-08-13 NOTE — Telephone Encounter (Signed)
Made patient aware of Charlotte's instructions.

## 2022-08-13 NOTE — Telephone Encounter (Signed)
Caller Name: [place] Call back phone #: [phone]  Reason for Call: pt would like call back to discuss the recommendation about her cholesterol.

## 2022-08-13 NOTE — Telephone Encounter (Signed)
.  lbp

## 2022-08-14 ENCOUNTER — Other Ambulatory Visit: Payer: Self-pay | Admitting: Nurse Practitioner

## 2022-08-14 DIAGNOSIS — D509 Iron deficiency anemia, unspecified: Secondary | ICD-10-CM

## 2022-08-14 DIAGNOSIS — Z1211 Encounter for screening for malignant neoplasm of colon: Secondary | ICD-10-CM

## 2022-08-14 NOTE — Progress Notes (Signed)
Iron level has not improved wit oral iron, so I recommend further evaluation by hematology and GI. I entered referrals In the mean time, I recommend repeat fecal occult test. Return to office for occult kits.

## 2022-08-16 NOTE — Progress Notes (Signed)
Left message to return call.  

## 2022-08-17 ENCOUNTER — Telehealth: Payer: Self-pay | Admitting: Nurse Practitioner

## 2022-08-17 DIAGNOSIS — H9311 Tinnitus, right ear: Secondary | ICD-10-CM

## 2022-08-17 NOTE — Telephone Encounter (Signed)
Pt was referred to   Referred To  [facility]  By [name] on 08/04/22. They are not contracted with her insurance, she will need a change of location for this referral.

## 2022-08-17 NOTE — Telephone Encounter (Signed)
Left lab results on vc mail per pt's instructions

## 2022-08-17 NOTE — Progress Notes (Signed)
Left detailed message on vc mail per other telephone encounter

## 2022-08-17 NOTE — Telephone Encounter (Signed)
Pt returning your call. She would like for you to leave her a message so you don't keep missing each other.

## 2022-08-18 NOTE — Telephone Encounter (Signed)
.  lb

## 2022-08-19 NOTE — Progress Notes (Unsigned)
Care Management & Coordination Services Pharmacy Note  08/19/2022 Name:  Penny Gibson MRN:  [id number] DOB:  07-31-1945  Summary: ***  Recommendations/Changes made from today's visit: ***  Follow up plan: ***   Subjective: Penny Gibson is an 77 y.o. year old female who is a primary patient of [name], [name], NP.  The care coordination team was consulted for assistance with disease management and care coordination needs.    {CCMTELEPHONEFACETOFACE:21091510} for {CCMINITIALFOLLOWUPCHOICE:21091511}.  Recent office visits: ***  Recent consult visits: ***  Hospital visits: {Hospital DC Yes/No:25215}   Objective:  Lab Results  Component Value Date   CREATININE 0.93 08/09/2022   BUN 23 08/09/2022   GFR 60.24 08/09/2022   GFRNONAA 15 (L) 03/14/2020   GFRAA 17 (L) 03/14/2020   NA 137 08/09/2022   K 4.1 08/09/2022   CALCIUM 9.6 08/09/2022   CO2 27 08/09/2022   GLUCOSE 100 (H) 08/09/2022    Lab Results  Component Value Date/Time   HGBA1C 6.4 08/09/2022 10:46 AM   HGBA1C 6.7 (H) 12/25/2021 11:35 AM   HGBA1C 6.5 09/16/2021 12:00 AM   GFR 60.24 08/09/2022 10:46 AM   GFR 37.35 (L) 12/25/2021 11:35 AM   MICROALBUR <0.7 01/20/2022 04:29 PM   MICROALBUR 0.7 08/05/2020 08:26 AM    Last diabetic Eye exam:  Lab Results  Component Value Date/Time   HMDIABEYEEXA No Retinopathy 07/21/2021 12:00 AM    Last diabetic Foot exam: No results found for: "HMDIABFOOTEX"   Lab Results  Component Value Date   CHOL 197 08/09/2022   HDL 49.20 08/09/2022   LDLCALC 117 (H) 08/09/2022   TRIG 151.0 (H) 08/09/2022   CHOLHDL 4 08/09/2022       Latest Ref Rng & Units 08/09/2022   10:46 AM 12/25/2021   11:35 AM 08/05/2020    8:26 AM  Hepatic Function  Total Protein 6.0 - 8.3 g/dL  9.0  8.4   Albumin 3.5 - 5.2 g/dL 4.1  4.4  4.1   AST 0 - 37 U/L  11  12   ALT 0 - 35 U/L  8  9   Alk Phosphatase 39 - 117 U/L  94  90   Total Bilirubin 0.2 - 1.2 mg/dL  0.4  0.3     Lab  Results  Component Value Date/Time   TSH 1.750 12/28/2019 09:32 AM   TSH 1.27 03/13/2018 09:48 AM   FREET4 1.09 12/28/2019 09:32 AM   FREET4 1.05 10/18/2007 08:24 PM       Latest Ref Rng & Units 08/09/2022   10:46 AM 02/05/2022    3:33 PM 01/20/2022    4:29 PM  CBC  WBC 4.0 - 10.5 K/uL 10.1  9.9  10.6   Hemoglobin 12.0 - 15.0 g/dL 11.4  9.2  9.3   Hematocrit 36.0 - 46.0 % 36.3  28.7  29.7   Platelets 150.0 - 400.0 K/uL 286.0  309.0  339.0     Lab Results  Component Value Date/Time   VD25OH 29.0 (L) 12/28/2019 09:32 AM   VITAMINB12 941 12/28/2019 09:32 AM   VITAMINB12 485 09/19/2013 05:55 PM    Clinical ASCVD: {YES/NO:21197} The 10-year ASCVD risk score (Arnett DK, et al., 2019) is: 35%   Values used to calculate the score:     Age: 89 years     Sex: Female     Is Non-Hispanic African American: Yes     Diabetic: Yes     Tobacco smoker: No  Systolic Blood Pressure: [id number] mmHg     Is BP treated: Yes     HDL Cholesterol: 49.2 mg/dL     Total Cholesterol: 197 mg/dL    ***Other: (CHADS2VASc if Afib, MMRC or CAT for COPD, ACT, DEXA)     08/04/2022   10:39 AM 07/30/2022   10:02 AM 06/04/2021   11:46 AM  Depression screen PHQ 2/9  Decreased Interest 0 0 0  Down, Depressed, Hopeless 0 0 0  PHQ - 2 Score 0 0 0     Social History   Tobacco Use  Smoking Status Never  Smokeless Tobacco Never   BP Readings from Last 3 Encounters:  08/04/22 (!) 140/104  02/17/22 139/88  01/20/22 126/86   Pulse Readings from Last 3 Encounters:  08/04/22 89  02/17/22 83  01/20/22 87   Wt Readings from Last 3 Encounters:  08/04/22 212 lb (96.2 kg)  07/30/22 214 lb (97.1 kg)  02/17/22 207 lb 6.4 oz (94.1 kg)   BMI Readings from Last 3 Encounters:  08/04/22 40.06 kg/m  07/30/22 39.14 kg/m  02/17/22 39.19 kg/m    Allergies  Allergen Reactions   Feraheme [Ferumoxytol] Anaphylaxis   Penicillins Anaphylaxis   Allopurinol Nausea Only   Atorvastatin Other (See Comments)     dizziness   Citric Acid Rash    Medications Reviewed Today     Reviewed by Flossie Buffy, NP (Nurse Practitioner) on 08/04/22 at 1238  Med List Status: <None>   Medication Order Taking? Sig Documenting Provider Last Dose Status Informant  acetaminophen (TYLENOL) 500 MG tablet [id number] No Take 500 mg by mouth every 6 (six) hours as needed for moderate pain. [provider] Taking Active Self  albuterol (VENTOLIN HFA) 108 (90 Base) MCG/ACT inhaler [id number] No INHALE 1 TO 2 PUFFS INTO THE LUNGS EVERY 6 HOURS AS NEEDED FOR WHEEZING OR SHORTNESS OF BREATH [name], [name], NP Taking Active Self  amLODipine (NORVASC) 5 MG tablet [id number] Yes Take 1 tablet (5 mg total) by mouth every evening. [name], [name], NP  Active   ammonium lactate (AMLACTIN) 12 % lotion 0000000 No Apply 1 application topically as needed for dry skin. Felipa Furnace, DPM Taking Active Self  Apoaequorin (PREVAGEN PO) [id number] No Take 1 tablet by mouth daily. [provider] Taking Active Self  aspirin EC 81 MG tablet [id number] No Take 1 tablet (81 mg total) by mouth 2 (two) times daily. To be taken after surgery to prevent blood clots.  Swallow whole. Aundra Dubin, PA-C Taking Active   Cholecalciferol (VITAMIN D3) 125 MCG (5000 UT) TABS [id number] No Take 5,000 Units by mouth daily. [provider] Taking Active Self  dexamethasone (DECADRON) injection 4 mg [id number]   Lorenda Peck, DPM  Active   diphenhydramine-acetaminophen (TYLENOL PM) 25-500 MG TABS tablet [id number] No Take 2 tablets by mouth at bedtime as needed (sleep/pain). [provider] Taking Active Self  FLUAD QUADRIVALENT 0.5 ML injection [id number]   [provider]  Active   Iron, Ferrous Sulfate, 325 (65 Fe) MG TABS [id number] No Take 325 mg by mouth in the morning and at bedtime. Haydee Salter, MD Taking Active   lisinopril (ZESTRIL) 40 MG tablet [id number] No TAKE 1 TABLET(40 MG) BY MOUTH DAILY  [name], [name], NP Taking Active   montelukast (SINGULAIR) 10 MG tablet [id number] No TAKE 1 TABLET(10 MG) BY MOUTH AT BEDTIME [name], [name], NP Taking Active   Multiple Vitamin (MULTIVITAMIN WITH MINERALS) TABS tablet [id number]  No Take 1 tablet by mouth daily. [provider] Taking Active Self  sennosides-docusate sodium (SENOKOT-S) 8.6-50 MG tablet [id number] No Take 1 tablet by mouth at bedtime. Flossie Buffy, NP Taking Active   [facility] syringe [id number]   [provider]  Active   vitamin B-12 (CYANOCOBALAMIN) 1000 MCG tablet [id number] No Take 1,000 mcg by mouth daily. [provider] Taking Active Self            SDOH:  (Social Determinants of Health) assessments and interventions performed: {yes/no:20286} SDOH Interventions    Flowsheet Row Clinical Support from 07/30/2022 in [facility] from 09/17/2021 in [facility] [place] from 03/06/2021 in [place] at [place] Interventions     Food Insecurity Interventions Intervention Not Indicated -- --  Transportation Interventions Intervention Not Indicated Intervention Not Indicated --  Depression Interventions/Treatment  -- -- [id number] Score <4 Follow-up Not Indicated  Financial Strain Interventions Intervention Not Indicated Intervention Not Indicated --  Physical Activity Interventions Patient Refused, Other (Comments) -- --  Stress Interventions Intervention Not Indicated -- --       Medication Assistance: {MEDASSISTANCEINFO:25044}  Medication Access: Within the past 30 days, how often has patient missed a dose of medication? *** Is a pillbox or other method used to improve adherence? {YES/NO:21197} Factors that may affect medication adherence? {CHL DESC; BARRIERS:21522} Are meds synced by current pharmacy? {YES/NO:21197} Are meds delivered by  current pharmacy? {YES/NO:21197} Does patient experience delays in picking up medications due to transportation concerns? {YES/NO:21197}  [facility] Reviewed: Is patient disadvantaged to use [facility]?: {YES/NO:21197} Current Rx insurance plan: *** Name and location of Current pharmacy:  [facility] [place], [address] [place] [address] Phone: [phone] Fax: [phone]  [facility] services reviewed with patient today?: {YES/NO:21197} Patient requests to transfer care to [facility]?: {YES/NO:21197} Reason patient declined to change pharmacies: {US patient preference:27474}  Compliance/Adherence/Medication fill history: Care Gaps: ***  Star-Rating Drugs: ***   Assessment/Plan  Hypertension (BP goal <140/90) -Controlled -Current treatment: Amlodipine 5 mg daily  Lisinopril 40 mg daily:Appropriate, Effective, Safe, Accessible -Medications previously tried: Amlodipine, HCTZ,  -Current home readings: 126/86 -Current dietary habits: seasons with Mrs. Dash salt-free seasoning  -Breakfast: Oatmeal OR wheat toast + butter, boiled egg, cup of decaf coffee  -Lunch: Sandwich (wheat + deli meat)   - Supper: Spinach OR broccoli, chicken OR fish, sweet potato   -Drinks: water  -Current exercise habits: walking 3 times weekly (5-10 minutes)  -Denies hypotensive/hypertensive symptoms -Patient reports polyuria, but does drink 8+ glasses of water daily due to her gout.  -Patient set goal to increase activity that is not strenuous on her knees (stretches, chair/arm exercises) 1-2 times weekly.  -Recommended to continue current medication  Hyperlipidemia: (LDL goal < 70) -Uncontrolled -Current treatment: Garlic supplement: :Appropriate, Query effective -Medications previously tried: Atorvastatin (dizziness),  -Discussed importance of statins in reducing  cardiovascular risk. Patient would consider it, but wants to wait until after updated lab work.  -Recommend rechecking fasting lipid panel  -Recommend starting rosuvastatin 5 mg daily.   Diabetes (A1c goal <7%) -Controlled -Current medications: None -Medications previously tried: Metformin -Counseled on diet and exercise extensively  Asthma (Goal: control symptoms and prevent exacerbations) -Not ideally controlled -Current treatment  Albuterol 1-2 puffs every 6 hours as needed:  Query appropriate Montelukast 10 mg daily:Appropriate, Effective, Safe, Accessible  -Medications previously tried: NA  -Exacerbations requiring treatment in last 6 months: None -Frequency of rescue inhaler use: twice monthly  -Discussed with patient updated GINA recommendations supporting use of low-dose, as needed ICS-Formoterol combination  -Recommend STOPPING albuterol -Recommend STARTING Symbicort 80-4.5 mcg 1-2 puffs twice daily as needed for shortness of breath  Gout (Goal: Prevent gout flares) -Not ideally controlled -Last Gout Flare: Jan 2023 -Current treatment  None -Medications previously tried: Allopurinol (lightheadedness)   -We discussed:  Counseled patient on low purine diet plan. Counseled patient to reduce consumption of high-fructose corn syrup, sweetened soft drinks, fruit juices, meat, and seafood. -Counseled on diet and exercise extensively  Chronic Kidney Disease Stage 3a  -All medications assessed for renal dosing and appropriateness in chronic kidney disease. -Recommended to continue current medication    ***

## 2022-08-24 ENCOUNTER — Ambulatory Visit: Payer: 59 | Attending: Audiologist | Admitting: Audiologist

## 2022-08-24 DIAGNOSIS — H9311 Tinnitus, right ear: Secondary | ICD-10-CM | POA: Insufficient documentation

## 2022-08-24 DIAGNOSIS — J349 Unspecified disorder of nose and nasal sinuses: Secondary | ICD-10-CM | POA: Diagnosis not present

## 2022-08-24 NOTE — Procedures (Signed)
  Outpatient Audiology and [place], [place]  [id number] [phone]  AUDIOLOGICAL  EVALUATION  NAME: Penny Gibson     DOB:   1945/10/07      MRN: [id number]                                                                                     DATE: 08/24/2022     REFERENT: Flossie Buffy, NP STATUS: Outpatient DIAGNOSIS: Tinnitus    History: Penny Gibson was seen for an audiological evaluation. Penny Gibson was referred for a hearing test due to tinnitus. Penny Gibson has a 'wooshing' sound, like the sound from a seashell in her right ear. Penny Gibson recently stopped taking several medications at the advice of her doctor. The tinnitus has improved since then. She also only experiences the tinnitus when having sinus congestion. The tinnitus improves if she sits up in a chair, instead of laying in bed. She has no pain in either ear. She feels she hears well. Penny Gibson has diabetes. No other case history reported.   Evaluation:  Otoscopy showed a clear view of the tympanic membranes, bilaterally Tympanometry results were consistent with normal middle ear function, bilaterally   Audiometric testing was completed using conventional audiometry with supraural transducer. Speech Recognition Thresholds were 15 dB in the right ear and 10 dB in the left ear. Word Recognition was performed 40dB SL, scored 100% in the right ear and 100% in the left ear. Pure tone thresholds show normal hearing bilaterally.   Results:  The test results were reviewed with Penny Gibson. She has excellent hearing in each ear. The tinnitus sounding like a woosh is likely due to her sinus issues and congestion. There is no indication of a hearing loss that would cause a tinnitus. The tinnitus is improving. If it is ever bothersome, use a environmental noise to mask. Follow up with PCP if sinus issues arise and tinnitus worsens again.   Recommendations: 1.   No further audiologic testing is needed unless  future hearing concerns arise. Manage sinus symptoms to decrease tinnitus. Use environmental masking when needed.    25 minutes spent testing and counseling on results.   Alfonse Alpers  Audiologist, Au.D., CCC-A 08/24/2022  9:44 AM  Cc: Flossie Buffy, NP

## 2022-08-26 ENCOUNTER — Other Ambulatory Visit: Payer: Self-pay | Admitting: Nurse Practitioner

## 2022-08-26 DIAGNOSIS — J4520 Mild intermittent asthma, uncomplicated: Secondary | ICD-10-CM

## 2022-08-30 ENCOUNTER — Telehealth: Payer: Self-pay | Admitting: Orthopaedic Surgery

## 2022-08-30 NOTE — Telephone Encounter (Signed)
yes

## 2022-08-30 NOTE — Telephone Encounter (Signed)
Patient would like gel injection right knee last injection was 03/09/2022 Durolane please advise

## 2022-09-01 NOTE — Telephone Encounter (Signed)
VOB submitted for Durolane, right knee.  Next gel injection needs to be after 09/07/2022.

## 2022-09-02 ENCOUNTER — Ambulatory Visit: Payer: 59

## 2022-09-02 ENCOUNTER — Other Ambulatory Visit: Payer: Self-pay | Admitting: Nurse Practitioner

## 2022-09-02 DIAGNOSIS — E1169 Type 2 diabetes mellitus with other specified complication: Secondary | ICD-10-CM

## 2022-09-02 DIAGNOSIS — I10 Essential (primary) hypertension: Secondary | ICD-10-CM

## 2022-09-02 NOTE — Progress Notes (Signed)
Care Management & Coordination Services Pharmacy Note  09/02/2022 Name:  Penny Gibson MRN:  [id number] DOB:  Aug 07, 1945  Summary: Patient presents for follow-up consult.   Recommendations/Changes made from today's visit: Continue current medications   Follow up plan: CPP follow-up 3 months   Subjective: Penny Gibson is an 77 y.o. year old female who is a primary patient of [name], [name], NP.  The care coordination team was consulted for assistance with disease management and care coordination needs.    Engaged with patient by telephone for follow up visit.  Recent office visits: 08/04/22: Patient presented to [name], NP for follow-up.   Recent consult visits: 03/09/22: Patient presented to Dr. Erlinda Hong (Ortho) for follow-up.   Hospital visits: None in previous 6 months   Objective:  Lab Results  Component Value Date   CREATININE 0.93 08/09/2022   BUN 23 08/09/2022   GFR 60.24 08/09/2022   GFRNONAA 15 (L) 03/14/2020   GFRAA 17 (L) 03/14/2020   NA 137 08/09/2022   K 4.1 08/09/2022   CALCIUM 9.6 08/09/2022   CO2 27 08/09/2022   GLUCOSE 100 (H) 08/09/2022    Lab Results  Component Value Date/Time   HGBA1C 6.4 08/09/2022 10:46 AM   HGBA1C 6.7 (H) 12/25/2021 11:35 AM   HGBA1C 6.5 09/16/2021 12:00 AM   GFR 60.24 08/09/2022 10:46 AM   GFR 37.35 (L) 12/25/2021 11:35 AM   MICROALBUR <0.7 01/20/2022 04:29 PM   MICROALBUR 0.7 08/05/2020 08:26 AM    Last diabetic Eye exam:  Lab Results  Component Value Date/Time   HMDIABEYEEXA No Retinopathy 07/21/2021 12:00 AM    Last diabetic Foot exam: No results found for: "HMDIABFOOTEX"   Lab Results  Component Value Date   CHOL 197 08/09/2022   HDL 49.20 08/09/2022   LDLCALC 117 (H) 08/09/2022   TRIG 151.0 (H) 08/09/2022   CHOLHDL 4 08/09/2022       Latest Ref Rng & Units 08/09/2022   10:46 AM 12/25/2021   11:35 AM 08/05/2020    8:26 AM  Hepatic Function  Total Protein 6.0 - 8.3 g/dL  9.0  8.4    Albumin 3.5 - 5.2 g/dL 4.1  4.4  4.1   AST 0 - 37 U/L  11  12   ALT 0 - 35 U/L  8  9   Alk Phosphatase 39 - 117 U/L  94  90   Total Bilirubin 0.2 - 1.2 mg/dL  0.4  0.3     Lab Results  Component Value Date/Time   TSH 1.750 12/28/2019 09:32 AM   TSH 1.27 03/13/2018 09:48 AM   FREET4 1.09 12/28/2019 09:32 AM   FREET4 1.05 10/18/2007 08:24 PM       Latest Ref Rng & Units 08/09/2022   10:46 AM 02/05/2022    3:33 PM 01/20/2022    4:29 PM  CBC  WBC 4.0 - 10.5 K/uL 10.1  9.9  10.6   Hemoglobin 12.0 - 15.0 g/dL 11.4  9.2  9.3   Hematocrit 36.0 - 46.0 % 36.3  28.7  29.7   Platelets 150.0 - 400.0 K/uL 286.0  309.0  339.0     Lab Results  Component Value Date/Time   VD25OH 29.0 (L) 12/28/2019 09:32 AM   VITAMINB12 941 12/28/2019 09:32 AM   VITAMINB12 485 09/19/2013 05:55 PM    Clinical ASCVD: No  The 10-year ASCVD risk score (Arnett DK, et al., 2019) is: 35%   Values used to calculate the score:  Age: 50 years     Sex: Female     Is Non-Hispanic African American: Yes     Diabetic: Yes     Tobacco smoker: No     Systolic Blood Pressure: [id number] mmHg     Is BP treated: Yes     HDL Cholesterol: 49.2 mg/dL     Total Cholesterol: 197 mg/dL       08/04/2022   10:39 AM 07/30/2022   10:02 AM 06/04/2021   11:46 AM  Depression screen PHQ 2/9  Decreased Interest 0 0 0  Down, Depressed, Hopeless 0 0 0  PHQ - 2 Score 0 0 0     Social History   Tobacco Use  Smoking Status Never  Smokeless Tobacco Never   BP Readings from Last 3 Encounters:  08/04/22 (!) 140/104  02/17/22 139/88  01/20/22 126/86   Pulse Readings from Last 3 Encounters:  08/04/22 89  02/17/22 83  01/20/22 87   Wt Readings from Last 3 Encounters:  08/04/22 212 lb (96.2 kg)  07/30/22 214 lb (97.1 kg)  02/17/22 207 lb 6.4 oz (94.1 kg)   BMI Readings from Last 3 Encounters:  08/04/22 40.06 kg/m  07/30/22 39.14 kg/m  02/17/22 39.19 kg/m    Allergies  Allergen Reactions   Feraheme [Ferumoxytol]  Anaphylaxis   Penicillins Anaphylaxis   Allopurinol Nausea Only   Atorvastatin Other (See Comments)    dizziness   Citric Acid Rash    Medications Reviewed Today     Reviewed by Flossie Buffy, NP (Nurse Practitioner) on 08/04/22 at 1238  Med List Status: <None>   Medication Order Taking? Sig Documenting Provider Last Dose Status Informant  acetaminophen (TYLENOL) 500 MG tablet [id number] No Take 500 mg by mouth every 6 (six) hours as needed for moderate pain. [provider] Taking Active Self  albuterol (VENTOLIN HFA) 108 (90 Base) MCG/ACT inhaler [id number] No INHALE 1 TO 2 PUFFS INTO THE LUNGS EVERY 6 HOURS AS NEEDED FOR WHEEZING OR SHORTNESS OF BREATH [name], [name], NP Taking Active Self  amLODipine (NORVASC) 5 MG tablet [id number] Yes Take 1 tablet (5 mg total) by mouth every evening. [name], [name], NP  Active   ammonium lactate (AMLACTIN) 12 % lotion 0000000 No Apply 1 application topically as needed for dry skin. Felipa Furnace, DPM Taking Active Self  Apoaequorin (PREVAGEN PO) [id number] No Take 1 tablet by mouth daily. [provider] Taking Active Self  aspirin EC 81 MG tablet [id number] No Take 1 tablet (81 mg total) by mouth 2 (two) times daily. To be taken after surgery to prevent blood clots.  Swallow whole. Aundra Dubin, PA-C Taking Active   Cholecalciferol (VITAMIN D3) 125 MCG (5000 UT) TABS [id number] No Take 5,000 Units by mouth daily. [provider] Taking Active Self  dexamethasone (DECADRON) injection 4 mg [id number]   Lorenda Peck, DPM  Active   diphenhydramine-acetaminophen (TYLENOL PM) 25-500 MG TABS tablet [id number] No Take 2 tablets by mouth at bedtime as needed (sleep/pain). [provider] Taking Active Self  FLUAD QUADRIVALENT 0.5 ML injection [id number]   [provider]  Active   Iron, Ferrous Sulfate, 325 (65 Fe) MG TABS [id number] No Take 325 mg by mouth in the morning and at bedtime. Haydee Salter, MD Taking Active   lisinopril (ZESTRIL) 40 MG tablet [id number] No TAKE 1 TABLET(40 MG) BY MOUTH DAILY [name], [name], NP Taking Active   montelukast (SINGULAIR) 10 MG tablet [id number] No TAKE  1 TABLET(10 MG) BY MOUTH AT BEDTIME [name], [name], NP Taking Active   Multiple Vitamin (MULTIVITAMIN WITH MINERALS) TABS tablet [id number] No Take 1 tablet by mouth daily. [provider] Taking Active Self  sennosides-docusate sodium (SENOKOT-S) 8.6-50 MG tablet [id number] No Take 1 tablet by mouth at bedtime. Flossie Buffy, NP Taking Active   [facility] syringe [id number]   [provider]  Active   vitamin B-12 (CYANOCOBALAMIN) 1000 MCG tablet [id number] No Take 1,000 mcg by mouth daily. [provider] Taking Active Self            SDOH:  (Social Determinants of Health) assessments and interventions performed: Yes SDOH Interventions    Flowsheet Row Clinical Support from 07/30/2022 in [place] [facility] from 09/17/2021 in [facility] [place] from 03/06/2021 in [facility] [place] Interventions     Food Insecurity Interventions Intervention Not Indicated -- --  Transportation Interventions Intervention Not Indicated Intervention Not Indicated --  Depression Interventions/Treatment  -- -- [id number] Score <4 Follow-up Not Indicated  Financial Strain Interventions Intervention Not Indicated Intervention Not Indicated --  Physical Activity Interventions Patient Refused, Other (Comments) -- --  Stress Interventions Intervention Not Indicated -- --       Medication Assistance: None required.  Patient affirms current coverage meets needs.  Medication Access: Within the past 30 days, how often has patient missed a dose of medication? None Is a pillbox or other method used to improve adherence? Yes  Factors that may affect  medication adherence? no barriers identified Are meds synced by current pharmacy? No  Are meds delivered by current pharmacy? No  Does patient experience delays in picking up medications due to transportation concerns? Yes   [facility] Services Reviewed: Is patient disadvantaged to use [facility]?: No  Current Rx insurance plan: [facility] Name and location of Current pharmacy:  [facility], [place] [address] Phone: [phone] Fax: [phone]  [facility] services reviewed with patient today?: No  Patient requests to transfer care to [facility]?: No  Reason patient declined to change pharmacies: Loyalty to other pharmacy/Patient preference  Compliance/Adherence/Medication fill history: Care Gaps: Shingrix  Covid   Star-Rating Drugs: Lisinopril 40 mg: Last filled 04/14/22 for 90-DS   Assessment/Plan  Hypertension (BP goal <140/90) -Controlled -Current treatment: Amlodipine 5 mg daily  Lisinopril 40 mg daily:Appropriate, Effective, Safe, Accessible -Medications previously tried: Amlodipine, HCTZ,  -Current home readings: 126/86 -Current dietary habits: seasons with Mrs. Dash salt-free seasoning  -Breakfast: Oatmeal OR wheat toast + butter, boiled egg, cup of decaf coffee  -Lunch: Sandwich (wheat + deli meat)   - Supper: Spinach OR broccoli, chicken OR fish, sweet potato   -Drinks: water  -Current exercise habits: walking 3 times weekly (5-10 minutes)  -Denies hypotensive/hypertensive symptoms -Patient reports polyuria, but does drink 8+ glasses of water daily due to her gout.  -Patient set goal to increase activity that is not strenuous on her knees (stretches, chair/arm exercises) 1-2 times weekly.  -Recommended to continue current medication  Hyperlipidemia: (LDL goal < 70) -Uncontrolled -Current treatment: Garlic supplement: :Appropriate,  Query effective -Medications previously tried: Atorvastatin (dizziness),  -Reports sore throat, fatigue, hand pain, numbness since starting.    Diabetes (A1c goal <7%) -Controlled -Current medications: None -Medications previously tried: Metformin -Counseled on  diet and exercise extensively  Asthma (Goal: control symptoms and prevent exacerbations) -Not ideally controlled -Current treatment  Albuterol 1-2 puffs every 6 hours as needed: Query appropriate Montelukast 10 mg daily:Appropriate, Effective, Safe, Accessible  -Medications previously tried: NA  -Exacerbations requiring treatment in last 6 months: None -Frequency of rescue inhaler use: twice monthly  -Discussed with patient updated GINA recommendations supporting use of low-dose, as needed ICS-Formoterol combination  -Recommend STOPPING albuterol -Recommend STARTING Symbicort 80-4.5 mcg 1-2 puffs twice daily as needed for shortness of breath  Gout (Goal: Prevent gout flares) -Not ideally controlled -Last Gout Flare: Jan 2023 -Current treatment  None -Medications previously tried: Allopurinol (lightheadedness)   -We discussed:  Counseled patient on low purine diet plan. Counseled patient to reduce consumption of high-fructose corn syrup, sweetened soft drinks, fruit juices, meat, and seafood. -Counseled on diet and exercise extensively  Chronic Kidney Disease Stage 3a  -All medications assessed for renal dosing and appropriateness in chronic kidney disease. -Recommended to continue current medication  Junius Argyle, PharmD, Para [place], CPP Clinical Pharmacist Practitioner  [facility] at [facility]  [phone]

## 2022-09-02 NOTE — Assessment & Plan Note (Signed)
Referred to lipid clinic LDL not at goal with pravastatin Unable to tolerate atorvastatin.

## 2022-09-07 ENCOUNTER — Encounter: Payer: Self-pay | Admitting: Nurse Practitioner

## 2022-09-07 DIAGNOSIS — H9313 Tinnitus, bilateral: Secondary | ICD-10-CM | POA: Insufficient documentation

## 2022-09-15 ENCOUNTER — Telehealth: Payer: Self-pay | Admitting: Orthopaedic Surgery

## 2022-09-15 NOTE — Telephone Encounter (Signed)
Called and scheduled

## 2022-09-15 NOTE — Telephone Encounter (Signed)
Patient states asking if her gel shot was approved she hasn't heard anything and states she call a couple of weeks ago. Please advise

## 2022-09-15 NOTE — Telephone Encounter (Signed)
Is she approved

## 2022-09-20 ENCOUNTER — Other Ambulatory Visit: Payer: 59

## 2022-09-27 DIAGNOSIS — M1711 Unilateral primary osteoarthritis, right knee: Secondary | ICD-10-CM | POA: Diagnosis not present

## 2022-09-27 NOTE — Progress Notes (Unsigned)
   Procedure Note  Patient: Penny Gibson             Date of Birth: [birth year]           MRN: [id number]             Visit Date: 09/28/2022  Procedures: Visit Diagnoses:  1. Primary osteoarthritis of right knee     Large Joint Inj: R knee on 09/27/2022 9:12 PM Indications: pain Details: 22 G needle  Arthrogram: No  Medications: 60 mg Sodium Hyaluronate 60 MG/3ML Outcome: tolerated well, no immediate complications Patient was prepped and draped in the usual sterile fashion.    Patient comes in for right durolane injection.  She tolerated it well.  Follow up in 6 months for repeat injection.

## 2022-09-28 ENCOUNTER — Ambulatory Visit (INDEPENDENT_AMBULATORY_CARE_PROVIDER_SITE_OTHER): Payer: 59 | Admitting: Orthopaedic Surgery

## 2022-09-28 ENCOUNTER — Encounter: Payer: Self-pay | Admitting: Orthopaedic Surgery

## 2022-09-28 DIAGNOSIS — M1711 Unilateral primary osteoarthritis, right knee: Secondary | ICD-10-CM

## 2022-09-28 MED ORDER — SODIUM HYALURONATE 60 MG/3ML IX PRSY
60.0000 mg | PREFILLED_SYRINGE | INTRA_ARTICULAR | Status: AC | PRN
  Administered 2022-09-27: 60 mg via INTRA_ARTICULAR

## 2022-09-29 ENCOUNTER — Other Ambulatory Visit: Payer: Self-pay

## 2022-09-29 DIAGNOSIS — M1711 Unilateral primary osteoarthritis, right knee: Secondary | ICD-10-CM

## 2022-10-27 ENCOUNTER — Other Ambulatory Visit: Payer: Self-pay | Admitting: Hematology and Oncology

## 2022-12-15 ENCOUNTER — Telehealth: Payer: Self-pay | Admitting: Nurse Practitioner

## 2022-12-15 NOTE — Telephone Encounter (Signed)
Pt said she took a covid test and it was positive today . Pt stated she came back from a cruise and felt sick. What should she do

## 2022-12-15 NOTE — Telephone Encounter (Signed)
She does not have my chart and does not want to sign up.  Had trouble in the past.  She is requesting a prescription for an inhaler.

## 2022-12-15 NOTE — Telephone Encounter (Signed)
She has a runny nose, bad cough which is worse at night, body aches and no fever.

## 2022-12-16 ENCOUNTER — Ambulatory Visit (INDEPENDENT_AMBULATORY_CARE_PROVIDER_SITE_OTHER): Payer: 59 | Admitting: Nurse Practitioner

## 2022-12-16 ENCOUNTER — Encounter: Payer: Self-pay | Admitting: Nurse Practitioner

## 2022-12-16 VITALS — BP 150/90 | HR 96 | Temp 98.7°F | Resp 16 | Ht 61.0 in | Wt 219.2 lb

## 2022-12-16 DIAGNOSIS — A084 Viral intestinal infection, unspecified: Secondary | ICD-10-CM | POA: Diagnosis not present

## 2022-12-16 DIAGNOSIS — U071 COVID-19: Secondary | ICD-10-CM | POA: Diagnosis not present

## 2022-12-16 DIAGNOSIS — J4521 Mild intermittent asthma with (acute) exacerbation: Secondary | ICD-10-CM | POA: Diagnosis not present

## 2022-12-16 DIAGNOSIS — J4520 Mild intermittent asthma, uncomplicated: Secondary | ICD-10-CM

## 2022-12-16 LAB — POC COVID19 BINAXNOW: SARS Coronavirus 2 Ag: POSITIVE — AB

## 2022-12-16 MED ORDER — BENZONATATE 100 MG PO CAPS
100.0000 mg | ORAL_CAPSULE | Freq: Three times a day (TID) | ORAL | 0 refills | Status: DC | PRN
Start: 2022-12-16 — End: 2023-09-30

## 2022-12-16 MED ORDER — NIRMATRELVIR/RITONAVIR (PAXLOVID)TABLET
3.0000 | ORAL_TABLET | Freq: Two times a day (BID) | ORAL | 0 refills | Status: AC
Start: 2022-12-16 — End: 2022-12-21

## 2022-12-16 MED ORDER — METHYLPREDNISOLONE 4 MG PO TBPK
ORAL_TABLET | ORAL | 0 refills | Status: DC
Start: 2022-12-16 — End: 2023-09-30

## 2022-12-16 MED ORDER — ALBUTEROL SULFATE HFA 108 (90 BASE) MCG/ACT IN AERS
1.0000 | INHALATION_SPRAY | Freq: Four times a day (QID) | RESPIRATORY_TRACT | 1 refills | Status: DC | PRN
Start: 2022-12-16 — End: 2023-04-04

## 2022-12-16 NOTE — Progress Notes (Signed)
Acute Office Visit  Subjective:    Patient ID: Penny Gibson, female    DOB: 06-06-1946, 77 y.o.   MRN: [id number]  Chief Complaint  Patient presents with   URI    Covid Positive- has bad cough and sob   URI  This is a new problem. The current episode started in the past 7 days. The problem has been unchanged. There has been no fever. Associated symptoms include congestion, coughing, diarrhea, headaches, joint pain, rhinorrhea and wheezing. Pertinent negatives include no abdominal pain, chest pain, dysuria, ear pain, joint swelling, nausea, neck pain, plugged ear sensation, rash, sinus pain, sneezing, sore throat, swollen glands or vomiting. She has tried decongestant for the symptoms. The treatment provided no relief.   Requested to repeat COVID test  Outpatient Medications Prior to Visit  Medication Sig   acetaminophen (TYLENOL) 500 MG tablet Take 500 mg by mouth every 6 (six) hours as needed for moderate pain.   amLODipine (NORVASC) 5 MG tablet Take 1 tablet (5 mg total) by mouth every evening.   ammonium lactate (AMLACTIN) 12 % lotion Apply 1 application topically as needed for dry skin.   aspirin EC 81 MG tablet Take 1 tablet (81 mg total) by mouth 2 (two) times daily. To be taken after surgery to prevent blood clots.  Swallow whole.   Cholecalciferol (VITAMIN D3) 125 MCG (5000 UT) TABS Take 5,000 Units by mouth daily.   Iron, Ferrous Sulfate, 325 (65 Fe) MG TABS Take 325 mg by mouth in the morning and at bedtime.   lisinopril (ZESTRIL) 40 MG tablet TAKE 1 TABLET(40 MG) BY MOUTH DAILY   montelukast (SINGULAIR) 10 MG tablet TAKE 1 TABLET(10 MG) BY MOUTH AT BEDTIME   pravastatin (PRAVACHOL) 20 MG tablet Take 1 tablet (20 mg total) by mouth daily.   sennosides-docusate sodium (SENOKOT-S) 8.6-50 MG tablet Take 1 tablet by mouth at bedtime.   [DISCONTINUED] albuterol (VENTOLIN HFA) 108 (90 Base) MCG/ACT inhaler INHALE 1 TO 2 PUFFS INTO THE LUNGS EVERY 6 HOURS AS NEEDED FOR WHEEZING  OR SHORTNESS OF BREATH   [DISCONTINUED] dexamethasone (DECADRON) injection 4 mg    No facility-administered medications prior to visit.    Reviewed past medical and social history.  Review of Systems  HENT:  Positive for congestion and rhinorrhea. Negative for ear pain, sinus pain, sneezing and sore throat.   Respiratory:  Positive for cough and wheezing.   Cardiovascular:  Negative for chest pain.  Gastrointestinal:  Positive for diarrhea. Negative for abdominal pain, nausea and vomiting.  Genitourinary:  Negative for dysuria.  Musculoskeletal:  Positive for joint pain. Negative for neck pain.  Skin:  Negative for rash.  Neurological:  Positive for headaches.   Per HPI     Objective:    Physical Exam Vitals reviewed.  Constitutional:      General: She is not in acute distress. Cardiovascular:     Rate and Rhythm: Normal rate and regular rhythm.     Pulses: Normal pulses.     Heart sounds: Normal heart sounds.  Pulmonary:     Effort: Pulmonary effort is normal.     Breath sounds: Normal breath sounds.  Neurological:     Mental Status: She is alert and oriented to person, place, and time.  Psychiatric:        Mood and Affect: Mood normal.        Behavior: Behavior normal.        Thought Content: Thought content normal.  BP (!) 142/90 (BP Location: Left Arm, Patient Position: Sitting, Cuff Size: Large)   Pulse 96   Temp 98.7 F (37.1 C) (Oral)   Resp 16   Ht 5\' 1"  (1.549 m)   Wt 219 lb 3.2 oz (99.4 kg)   SpO2 96%   BMI 41.42 kg/m  BP Readings from Last 3 Encounters:  12/16/22 (!) 150/90  08/04/22 (!) 140/104  02/17/22 139/88   Wt Readings from Last 3 Encounters:  12/16/22 219 lb 3.2 oz (99.4 kg)  08/04/22 212 lb (96.2 kg)  07/30/22 214 lb (97.1 kg)      Results for orders placed or performed in visit on 12/16/22  POC COVID-19  Result Value Ref Range   SARS Coronavirus 2 Ag Positive (A) Negative       Assessment & Plan:   Problem List Items  Addressed This Visit       Respiratory   Asthma   Relevant Medications   methylPREDNISolone (MEDROL DOSEPAK) 4 MG TBPK tablet   albuterol (VENTOLIN HFA) 108 (90 Base) MCG/ACT inhaler   Other Visit Diagnoses     COVID-19    -  Primary   Relevant Medications   nirmatrelvir/ritonavir (PAXLOVID) 20 x 150 MG & 10 x 100MG  TABS   methylPREDNISolone (MEDROL DOSEPAK) 4 MG TBPK tablet   albuterol (VENTOLIN HFA) 108 (90 Base) MCG/ACT inhaler   benzonatate (TESSALON) 100 MG capsule   Other Relevant Orders   POC COVID-19 (Completed)   Viral gastroenteritis       Relevant Medications   nirmatrelvir/ritonavir (PAXLOVID) 20 x 150 MG & 10 x 100MG  TABS   Other Relevant Orders   POC COVID-19 (Completed)      Meds ordered this encounter  Medications   nirmatrelvir/ritonavir (PAXLOVID) 20 x 150 MG & 10 x 100MG  TABS    Sig: Take 3 tablets by mouth 2 (two) times daily for 5 days. (Take nirmatrelvir 150 mg two tablets twice daily for 5 days and ritonavir 100 mg one tablet twice daily for 5 days) Patient GFR is 96    Dispense:  30 tablet    Refill:  0    Order Specific Question:   Supervising Provider    Answer:   Nadene Rubins ALFRED [[id number]]   methylPREDNISolone (MEDROL DOSEPAK) 4 MG TBPK tablet    Sig: Take as directed on package    Dispense:  21 tablet    Refill:  0    Order Specific Question:   Supervising Provider    Answer:   Nadene Rubins ALFRED [[id number]]   albuterol (VENTOLIN HFA) 108 (90 Base) MCG/ACT inhaler    Sig: Inhale 1-2 puffs into the lungs every 6 (six) hours as needed for wheezing or shortness of breath.    Dispense:  6.7 g    Refill:  1    Order Specific Question:   Supervising Provider    Answer:   Nadene Rubins ALFRED [[id number]]   benzonatate (TESSALON) 100 MG capsule    Sig: Take 1 capsule (100 mg total) by mouth 3 (three) times daily as needed.    Dispense:  21 capsule    Refill:  0    Order Specific Question:   Supervising Provider    Answer:   Mliss Sax  [[id number]]  Stop all OVER THE COUNTER cold and sinus medications due to elevated BP Ok to use coricidin only. Encourage adequate oral hydration. Ok to use peptobismol prn for diarrhea Go to ED if symptoms worsen Ok to schedule  1week f/up appointment if no improvement  Return in about 2 weeks (around 12/30/2022) for HTN.  Alysia Penna, NP

## 2022-12-16 NOTE — Patient Instructions (Addendum)
Stop all OVER THE COUNTER cold and sinus medications due to elevated BP Ok to use coricidin only. Encourage adequate oral hydration. Ok to use peptobismol prn for diarrhea Go to ED if symptoms worsen Ok to schedule 1week f/up appointment if no improvement  Diarrhea, Adult Diarrhea is when you pass loose and sometimes watery poop (stool) often. Diarrhea can make you feel weak and cause you to lose water in your body (get dehydrated). Losing water in your body can cause you to: Feel tired and thirsty. Have a dry mouth. Go pee (urinate) less often. Diarrhea often lasts 2-3 days. It can last longer if it is a sign of something more serious. Be sure to treat your diarrhea as told by your doctor. Follow these instructions at home: Eating and drinking     Follow these instructions as told by your doctor: Take an ORS (oral rehydration solution). This is a drink that helps you replace fluids and minerals your body lost. It is sold at pharmacies and stores. Drink enough fluid to keep your pee (urine) pale yellow. Drink fluids such as: Water. You can also get fluids by sucking on ice chips. Diluted fruit juice. Low-calorie sports drinks. Milk. Avoid drinking fluids that have a lot of sugar or caffeine in them. These include soda, energy drinks, and regular sports drinks. Avoid alcohol. Eat bland, easy-to-digest foods in small amounts as you are able. These foods include: Bananas. Applesauce. Rice. Low-fat (lean) meats. Toast. Crackers. Avoid spicy or fatty foods.  Medicines Take over-the-counter and prescription medicines only as told by your doctor. If you were prescribed antibiotics, take them as told by your doctor. Do not stop taking them even if you start to feel better. General instructions  Wash your hands often using soap and water for 20 seconds. If soap and water are not available, use hand sanitizer. Others in your home should wash their hands as well. Wash your  hands: After using the toilet or changing a diaper. Before preparing, cooking, or serving food. While caring for a sick person. While visiting someone in a hospital. Rest at home while you get better. Take a warm bath to help with any burning or pain from having diarrhea. Watch your condition for any changes. Contact a doctor if: You have a fever. Your diarrhea gets worse. You have new symptoms. You vomit every time you eat or drink. You feel light-headed, dizzy, or you have a headache. You have muscle cramps. You have signs of losing too much water in your body, such as: Dark pee, very little pee, or no pee. Cracked lips. Dry mouth. Sunken eyes. Sleepiness. Weakness. You have bloody or black poop or poop that looks like tar. You have very bad pain, cramping, or bloating in your belly (abdomen). Your skin feels cold and clammy. You feel confused. Get help right away if: You have chest pain. Your heart is beating very quickly. You have trouble breathing or you are breathing very quickly. You feel very weak or you faint. These symptoms may be an emergency. Get help right away. Call 911. Do not wait to see if the symptoms will go away. Do not drive yourself to the hospital. This information is not intended to replace advice given to you by your health care provider. Make sure you discuss any questions you have with your health care provider. Document Revised: 11/24/2021 Document Reviewed: 11/24/2021 Elsevier Patient Education  2024 [employer].

## 2022-12-16 NOTE — Telephone Encounter (Signed)
Pt was seen  @1 :40pm

## 2023-02-03 ENCOUNTER — Other Ambulatory Visit: Payer: Self-pay | Admitting: Nurse Practitioner

## 2023-02-03 DIAGNOSIS — I10 Essential (primary) hypertension: Secondary | ICD-10-CM

## 2023-02-10 DIAGNOSIS — L814 Other melanin hyperpigmentation: Secondary | ICD-10-CM | POA: Diagnosis not present

## 2023-02-10 DIAGNOSIS — L821 Other seborrheic keratosis: Secondary | ICD-10-CM | POA: Diagnosis not present

## 2023-02-11 ENCOUNTER — Other Ambulatory Visit: Payer: Self-pay | Admitting: Nurse Practitioner

## 2023-02-11 DIAGNOSIS — Z1231 Encounter for screening mammogram for malignant neoplasm of breast: Secondary | ICD-10-CM

## 2023-02-11 DIAGNOSIS — H9311 Tinnitus, right ear: Secondary | ICD-10-CM

## 2023-02-11 DIAGNOSIS — I10 Essential (primary) hypertension: Secondary | ICD-10-CM

## 2023-02-11 DIAGNOSIS — E785 Hyperlipidemia, unspecified: Secondary | ICD-10-CM

## 2023-02-11 DIAGNOSIS — Z780 Asymptomatic menopausal state: Secondary | ICD-10-CM

## 2023-02-11 DIAGNOSIS — E790 Hyperuricemia without signs of inflammatory arthritis and tophaceous disease: Secondary | ICD-10-CM

## 2023-02-11 DIAGNOSIS — Z853 Personal history of malignant neoplasm of breast: Secondary | ICD-10-CM

## 2023-02-11 DIAGNOSIS — E1122 Type 2 diabetes mellitus with diabetic chronic kidney disease: Secondary | ICD-10-CM

## 2023-02-11 DIAGNOSIS — J4520 Mild intermittent asthma, uncomplicated: Secondary | ICD-10-CM

## 2023-02-11 DIAGNOSIS — D509 Iron deficiency anemia, unspecified: Secondary | ICD-10-CM

## 2023-02-11 DIAGNOSIS — Z9889 Other specified postprocedural states: Secondary | ICD-10-CM

## 2023-02-17 ENCOUNTER — Other Ambulatory Visit: Payer: 59

## 2023-03-07 ENCOUNTER — Other Ambulatory Visit: Payer: Self-pay | Admitting: Nurse Practitioner

## 2023-03-07 DIAGNOSIS — J302 Other seasonal allergic rhinitis: Secondary | ICD-10-CM

## 2023-04-04 ENCOUNTER — Other Ambulatory Visit: Payer: Self-pay | Admitting: Nurse Practitioner

## 2023-04-04 DIAGNOSIS — J4521 Mild intermittent asthma with (acute) exacerbation: Secondary | ICD-10-CM

## 2023-04-04 DIAGNOSIS — U071 COVID-19: Secondary | ICD-10-CM

## 2023-04-13 ENCOUNTER — Other Ambulatory Visit: Payer: Self-pay

## 2023-04-13 ENCOUNTER — Telehealth: Payer: Self-pay

## 2023-04-13 DIAGNOSIS — E1122 Type 2 diabetes mellitus with diabetic chronic kidney disease: Secondary | ICD-10-CM

## 2023-04-13 NOTE — Telephone Encounter (Signed)
Called patient to informed her she is due for labs and attempted to schedule lab visit.  Patient stated she would call back to schedule.

## 2023-04-21 ENCOUNTER — Other Ambulatory Visit: Payer: Self-pay | Admitting: Nurse Practitioner

## 2023-04-21 DIAGNOSIS — U071 COVID-19: Secondary | ICD-10-CM

## 2023-04-21 DIAGNOSIS — J4521 Mild intermittent asthma with (acute) exacerbation: Secondary | ICD-10-CM

## 2023-04-28 ENCOUNTER — Ambulatory Visit: Payer: 59 | Admitting: Orthopaedic Surgery

## 2023-04-28 ENCOUNTER — Telehealth: Payer: Self-pay | Admitting: Orthopaedic Surgery

## 2023-04-28 ENCOUNTER — Encounter: Payer: Self-pay | Admitting: Orthopaedic Surgery

## 2023-04-28 DIAGNOSIS — M1711 Unilateral primary osteoarthritis, right knee: Secondary | ICD-10-CM

## 2023-04-28 MED ORDER — SODIUM HYALURONATE 60 MG/3ML IX PRSY
60.0000 mg | PREFILLED_SYRINGE | INTRA_ARTICULAR | Status: AC | PRN
  Administered 2023-04-28: 60 mg via INTRA_ARTICULAR

## 2023-04-28 NOTE — Telephone Encounter (Signed)
Pt requesting Gel injection stated Cortisone only works for a few days and her last Durolane injection in April only lasted about 2-3 weeks please advise different options

## 2023-04-28 NOTE — Progress Notes (Signed)
   Procedure Note  Patient: Penny Gibson             Date of Birth: [birth year]           MRN: [id number]             Visit Date: 04/28/2023  Procedures: Visit Diagnoses:  1. Primary osteoarthritis of right knee    Eber Jones underwent right knee Durolane injection today.  She tolerated this well.  Back as needed.  Large Joint Inj: R knee on 04/28/2023 10:51 AM Indications: pain Details: 22 G needle  Arthrogram: No  Medications: 60 mg Sodium Hyaluronate 60 MG/3ML Outcome: tolerated well, no immediate complications Patient was prepped and draped in the usual sterile fashion.

## 2023-04-28 NOTE — Telephone Encounter (Signed)
Called and left a VM for patient to return my call concerning gel injection.

## 2023-04-28 NOTE — Telephone Encounter (Signed)
Patient returned call asked for a call back. The number to contact patient is [phone]

## 2023-04-28 NOTE — Telephone Encounter (Signed)
Talked with patient concerning a different gel injection brand.   Advised patient that I will submit for the series in 6 months and maybe that will last longer for her knee.  Patient voiced that she understands

## 2023-06-27 ENCOUNTER — Telehealth: Payer: Self-pay

## 2023-06-27 NOTE — Telephone Encounter (Signed)
 Informed pt that labs are typically not ordered/collected prior to physical at this office. I will make a note on appt desk that pt requests labs be ordered for [facility] lab.

## 2023-06-27 NOTE — Telephone Encounter (Signed)
 Copied from CRM [id number]. Topic: Clinical - Lab/Test Results >> Jun 27, 2023 11:28 AM Burnard DEL wrote: Reason for CRM: patient is scheduled for physical 08/10/23.She stated that she usually gets her labs drawn at the [facility].She would like to have lab orders placed for her to get schedule.

## 2023-07-19 ENCOUNTER — Ambulatory Visit
Admission: RE | Admit: 2023-07-19 | Discharge: 2023-07-19 | Disposition: A | Discharge status: Home / Self Care | Payer: 59 | Source: Ambulatory Visit | Attending: Nurse Practitioner | Admitting: Nurse Practitioner

## 2023-07-19 DIAGNOSIS — Z9889 Other specified postprocedural states: Secondary | ICD-10-CM

## 2023-07-19 DIAGNOSIS — Z1231 Encounter for screening mammogram for malignant neoplasm of breast: Secondary | ICD-10-CM

## 2023-07-19 DIAGNOSIS — Z853 Personal history of malignant neoplasm of breast: Secondary | ICD-10-CM

## 2023-08-01 ENCOUNTER — Ambulatory Visit (INDEPENDENT_AMBULATORY_CARE_PROVIDER_SITE_OTHER): Payer: 59

## 2023-08-01 DIAGNOSIS — Z0000 Encounter for general adult medical examination without abnormal findings: Secondary | ICD-10-CM

## 2023-08-01 NOTE — Progress Notes (Signed)
 Subjective:   Penny Gibson is a 78 y.o. female who presents for Medicare Annual (Subsequent) preventive examination.  Visit Complete: Virtual I connected with  Raliegh Burgess on 08/01/23 by a audio enabled telemedicine application and verified that I am speaking with the correct person using two identifiers.  Patient Location: Home  Provider Location: Office/Clinic  I discussed the limitations of evaluation and management by telemedicine. The patient expressed understanding and agreed to proceed.  Vital Signs: Because this visit was a virtual/telehealth visit, some criteria may be missing or patient reported. Any vitals not documented were not able to be obtained and vitals that have been documented are patient reported.    Cardiac Risk Factors include: advanced age (>64men, >53 women);diabetes mellitus;dyslipidemia;hypertension     Objective:    Today's Vitals   There is no height or weight on file to calculate BMI.     08/01/2023   10:17 AM 07/30/2022   10:01 AM 03/06/2021   11:00 AM 11/20/2020   11:00 AM 10/06/2020   11:18 AM 09/19/2020    7:28 AM 09/11/2020   12:19 PM  Advanced Directives  Does Patient Have a Medical Advance Directive? Yes Yes No No No No No  Type of Estate agent of [place];Living will Healthcare Power of [place];Living will       Copy of Healthcare Power of Attorney in Chart? No - copy requested No - copy requested       Would patient like information on creating a medical advance directive?   No - Patient declined No - Patient declined  No - Patient declined No - Patient declined    Current Medications (verified) Outpatient Encounter Medications as of 08/01/2023  Medication Sig   acetaminophen  (TYLENOL ) 500 MG tablet Take 500 mg by mouth every 6 (six) hours as needed for moderate pain.   albuterol  (VENTOLIN  HFA) 108 (90 Base) MCG/ACT inhaler USE 1 TO 2 INHALATIONS BY MOUTH  INTO THE LUNGS EVERY 6 HOURS AS  NEEDED FOR SHORTNESS  OF BREATH  OR WHEEZING   Cholecalciferol (VITAMIN D3) 125 MCG (5000 UT) TABS Take 5,000 Units by mouth daily.   lisinopril  (ZESTRIL ) 40 MG tablet TAKE 1 TABLET(40 MG) BY MOUTH DAILY   montelukast  (SINGULAIR ) 10 MG tablet TAKE 1 TABLET BY MOUTH AT  BEDTIME   amLODipine  (NORVASC ) 5 MG tablet TAKE 1 TABLET(5 MG) BY MOUTH EVERY EVENING (Patient not taking: Reported on 08/01/2023)   ammonium lactate  (AMLACTIN) 12 % lotion Apply 1 application topically as needed for dry skin. (Patient not taking: Reported on 08/01/2023)   benzonatate  (TESSALON ) 100 MG capsule Take 1 capsule (100 mg total) by mouth 3 (three) times daily as needed. (Patient not taking: Reported on 08/01/2023)   Iron , Ferrous Sulfate , 325 (65 Fe) MG TABS Take 325 mg by mouth in the morning and at bedtime. (Patient not taking: Reported on 08/01/2023)   methylPREDNISolone  (MEDROL  DOSEPAK) 4 MG TBPK tablet Take as directed on package (Patient not taking: Reported on 08/01/2023)   pravastatin  (PRAVACHOL ) 20 MG tablet Take 1 tablet (20 mg total) by mouth daily. (Patient not taking: Reported on 08/01/2023)   sennosides-docusate sodium  (SENOKOT-S) 8.6-50 MG tablet Take 1 tablet by mouth at bedtime. (Patient not taking: Reported on 08/01/2023)   No facility-administered encounter medications on file as of 08/01/2023.    Allergies (verified) Feraheme  [ferumoxytol ], Penicillins, Allopurinol , Atorvastatin , and Citric acid   History: Past Medical History:  Diagnosis Date   Anemia    Arthritis  Asthma    Constipation    Diabetes mellitus without complication (HCC)    Dyspnea    Hyperlipidemia    Hypertension    Insomnia    Joint pain    Knee pain    Lactose intolerance    Lower extremity edema    Multiple food allergies    Osteoarthritis    Sinus problem    Past Surgical History:  Procedure Laterality Date   BREAST BIOPSY Left 2010   BREAST LUMPECTOMY Left 2010   BREAST LUMPECTOMY WITH RADIOACTIVE SEED LOCALIZATION Right 09/19/2020    Procedure: RIGHT BREAST LUMPECTOMY WITH RADIOACTIVE SEED LOCALIZATION;  Surgeon: Caralyn Chandler, MD;  Location: [facility];  Service: General;  Laterality: Right;   BREAST SURGERY Right 09/19/2020   EYE SURGERY Bilateral    cataract surgery   REPLACEMENT TOTAL KNEE  03/10/2020   TOTAL KNEE ARTHROPLASTY Left 03/10/2020   Procedure: LEFT TOTAL KNEE ARTHROPLASTY;  Surgeon: Wes Hamman, MD;  Location: [facility];  Service: Orthopedics;  Laterality: Left;   TUBAL LIGATION     Family History  Problem Relation Age of Onset   Heart attack Mother    Heart disease Mother    Sudden death Mother    Obesity Mother    Cancer Sister    Breast cancer Sister 100   Stroke Maternal Grandmother    Sudden death Father    Social History   Socioeconomic History   Marital status: Divorced    Spouse name: Not on file   Number of children: 2   Years of education: Not on file   Highest education level: Not on file  Occupational History   Not on file  Tobacco Use   Smoking status: Never   Smokeless tobacco: Never  Vaping Use   Vaping status: Never Used  Substance and Sexual Activity   Alcohol use: No   Drug use: No   Sexual activity: Not Currently    Birth control/protection: Surgical  Other Topics Concern   Not on file  Social History Narrative   Not on file   Social Drivers of Health   Financial Resource Strain: Low Risk  (08/01/2023)   Overall Financial Resource Strain (CARDIA)    Difficulty of Paying Living Expenses: Not hard at all  Food Insecurity: No Food Insecurity (08/01/2023)   Hunger Vital Sign    Worried About Running Out of Food in the Last Year: Never true    Ran Out of Food in the Last Year: Never true  Transportation Needs: No Transportation Needs (08/01/2023)   PRAPARE - Administrator, Civil Service (Medical): No    Lack of Transportation (Non-Medical): No  Physical Activity: Inactive (08/01/2023)   Exercise Vital Sign    Days of  Exercise per Week: 0 days    Minutes of Exercise per Session: 0 min  Stress: No Stress Concern Present (08/01/2023)   [employer] of Occupational Health - Occupational Stress Questionnaire    Feeling of Stress : Only a little  Social Connections: Moderately Isolated (08/01/2023)   Social Connection and Isolation Panel [NHANES]    Frequency of Communication with Friends and Family: More than three times a week    Frequency of Social Gatherings with Friends and Family: Three times a week    Attends Religious Services: More than 4 times per year    Active Member of Clubs or Organizations: No    Attends Banker Meetings: Never  Marital Status: Divorced    Tobacco Counseling Counseling given: Not Answered   Clinical Intake:  Pre-visit preparation completed: Yes  Pain : No/denies pain     Nutritional Risks: None Diabetes: Yes CBG done?: No Did pt. bring in CBG monitor from home?: No  How often do you need to have someone help you when you read instructions, pamphlets, or other written materials from your doctor or pharmacy?: 1 - Never  Interpreter Needed?: No  Information entered by :: NAllen LPN   Activities of Daily Living    08/01/2023   10:08 AM  In your present state of health, do you have any difficulty performing the following activities:  Hearing? 1  Comment thinks she has an ear infection  Vision? 0  Difficulty concentrating or making decisions? 0  Walking or climbing stairs? 1  Comment due to knee  Dressing or bathing? 0  Doing errands, shopping? 0  Preparing Food and eating ? N  Using the Toilet? N  In the past six months, have you accidently leaked urine? Y  Comment with a sneeze  Do you have problems with loss of bowel control? N  Managing your Medications? N  Managing your Finances? N  Housekeeping or managing your Housekeeping? N    Patient Care Team: [name], Connye Delaine, NP as PCP - General (Internal Medicine) Sonja Desha, MD  as Consulting Physician (Hematology) Caralyn Chandler, MD as Consulting Physician (General Surgery) Johna Myers, MD as Consulting Physician (Radiation Oncology) Debbie Fails Laura Polio, NP as Nurse Practitioner (Hematology and Oncology) Neda Balk, RN as Registered Nurse Nicholette Barley, RPH (Inactive) (Pharmacist)  Indicate any recent Medical Services you may have received from other than [facility] providers in the past year (date may be approximate).     Assessment:   This is a routine wellness examination for Gustine.  Hearing/Vision screen Hearing Screening - Comments:: Denies hearing issues Vision Screening - Comments:: Regular eye exams, [facility]   Goals Addressed             This Visit's Progress    Patient Stated       08/01/2023, wants to lose weight       Depression Screen    08/01/2023   10:19 AM 08/04/2022   10:39 AM 07/30/2022   10:02 AM 06/04/2021   11:46 AM 03/06/2021   11:00 AM 12/27/2019   10:36 AM 07/11/2019   10:43 AM  PHQ 2/9 Scores  PHQ - 2 Score 0 0 0 0 1 6 0  PHQ- 9 Score 3    2 16      Fall Risk    08/01/2023   10:18 AM 08/04/2022   10:38 AM 07/30/2022   10:02 AM 07/07/2021    9:09 AM 03/06/2021   11:00 AM  Fall Risk   Falls in the past year? 0 0 0 0 0  Number falls in past yr: 0 0 0 0 0  Injury with Fall? 0 0 0 0 0  Risk for fall due to : Medication side effect;Impaired mobility;Impaired balance/gait No Fall Risks Medication side effect No Fall Risks No Fall Risks  Follow up Falls prevention discussed;Falls evaluation completed Falls evaluation completed Falls prevention discussed;Education provided;Falls evaluation completed Falls evaluation completed Falls evaluation completed    MEDICARE RISK AT HOME: Medicare Risk at Home Any stairs in or around the home?: Yes If so, are there any without handrails?: No Home free of loose throw rugs in walkways, pet beds, electrical cords, etc?:  Yes Adequate lighting in your home to reduce risk of  falls?: Yes Life alert?: No Use of a cane, walker or w/c?: Yes Grab bars in the bathroom?: Yes Shower chair or bench in shower?: No Elevated toilet seat or a handicapped toilet?: Yes  TIMED UP AND GO:  Was the test performed?  No    Cognitive Function:    01/31/2014    9:26 AM  MMSE - Mini Mental State Exam  Orientation to time 5  Orientation to Place 5  Registration 3  Attention/ Calculation 5  Recall 3  Language- name 2 objects 2  Language- repeat 1  Language- follow 3 step command 3  Language- read & follow direction 1  Write a sentence 1  Copy design 1  Total score 30        08/01/2023   10:22 AM 07/30/2022   10:05 AM 03/06/2021   11:25 AM  6CIT Screen  What Year? 0 points 0 points 0 points  What month? 0 points 0 points 0 points  What time? 0 points 0 points 0 points  Count back from 20 0 points 0 points 0 points  Months in reverse 0 points 0 points 0 points  Repeat phrase 0 points 0 points 0 points  Total Score 0 points 0 points 0 points    Immunizations Immunization History  Administered Date(s) Administered   Fluad Quad(high Dose 65+) 03/26/2021, 04/02/2023   Influenza, High Dose Seasonal PF 03/19/2016, 04/12/2017, 04/21/2018   Influenza,inj,Quad PF,6+ Mos 07/26/2014   Influenza-Unspecified 03/21/2013, 04/21/2018, 04/01/2020, 04/21/2022   Moderna Sars-Covid-2 Vaccination 08/04/2019, 09/02/2019, 05/01/2020   Pneumococcal Conjugate-13 07/26/2014   Pneumococcal Polysaccharide-23 09/20/2013   Tdap 06/22/2013   Unspecified SARS-COV-2 Vaccination 04/02/2023   Zoster Recombinant(Shingrix) 04/12/2019, 06/28/2019    TDAP status: Due, Education has been provided regarding the importance of this vaccine. Advised may receive this vaccine at local pharmacy or Health Dept. Aware to provide a copy of the vaccination record if obtained from local pharmacy or Health Dept. Verbalized acceptance and understanding.  Flu Vaccine status: Up to date  Pneumococcal vaccine  status: Up to date  Covid-19 vaccine status: Completed vaccines  Qualifies for Shingles Vaccine? Yes   Zostavax completed Yes   Shingrix Completed?: Yes  Screening Tests Health Maintenance  Topic Date Due   Diabetic kidney evaluation - Urine ACR  01/21/2023   HEMOGLOBIN A1C  02/07/2023   COVID-19 Vaccine (5 - 2024-25 season) 05/28/2023   DTaP/Tdap/Td (2 - Td or Tdap) 06/23/2023   OPHTHALMOLOGY EXAM  07/29/2023   Diabetic kidney evaluation - eGFR measurement  08/10/2023   FOOT EXAM  08/05/2023   Medicare Annual Wellness (AWV)  07/31/2024   Pneumonia Vaccine 58+ Years old  Completed   INFLUENZA VACCINE  Completed   DEXA SCAN  Completed   Hepatitis C Screening  Completed   Zoster Vaccines- Shingrix  Completed   HPV VACCINES  Aged Out   Fecal DNA (Cologuard)  Discontinued    Health Maintenance  Health Maintenance Due  Topic Date Due   Diabetic kidney evaluation - Urine ACR  01/21/2023   HEMOGLOBIN A1C  02/07/2023   COVID-19 Vaccine (5 - 2024-25 season) 05/28/2023   DTaP/Tdap/Td (2 - Td or Tdap) 06/23/2023   OPHTHALMOLOGY EXAM  07/29/2023   Diabetic kidney evaluation - eGFR measurement  08/10/2023    Colorectal cancer screening: No longer required.   Mammogram status: Completed 07/19/2023. Repeat every year  Bone Density status: Completed 03/16/2019.   Lung Cancer Screening: (  Low Dose CT Chest recommended if Age 90-80 years, 20 pack-year currently smoking OR have quit w/in 15years.) does not qualify.   Lung Cancer Screening Referral: no  Additional Screening:  Hepatitis C Screening: does qualify; Completed 09/24/2016  Vision Screening: Recommended annual ophthalmology exams for early detection of glaucoma and other disorders of the eye. Is the patient up to date with their annual eye exam?  Yes  Who is the provider or what is the name of the office in which the patient attends annual eye exams? [facility] If pt is not established with a provider, would they like  to be referred to a provider to establish care? No .   Dental Screening: Recommended annual dental exams for proper oral hygiene  Diabetic Foot Exam: Diabetic Foot Exam: Completed 08/04/2022  Community Resource Referral / Chronic Care Management: CRR required this visit?  No   CCM required this visit?  No     Plan:     I have personally reviewed and noted the following in the patient's chart:   Medical and social history Use of alcohol, tobacco or illicit drugs  Current medications and supplements including opioid prescriptions. Patient is not currently taking opioid prescriptions. Functional ability and status Nutritional status Physical activity Advanced directives List of other physicians Hospitalizations, surgeries, and ER visits in previous 12 months Vitals Screenings to include cognitive, depression, and falls Referrals and appointments  In addition, I have reviewed and discussed with patient certain preventive protocols, quality metrics, and best practice recommendations. A written personalized care plan for preventive services as well as general preventive health recommendations were provided to patient.     Areatha Beecham, LPN   12/18/5282   After Visit Summary: (Pick Up) Due to this being a telephonic visit, with patients personalized plan was offered to patient and patient has requested to Pick up at office.  Nurse Notes: none

## 2023-08-01 NOTE — Patient Instructions (Addendum)
 Ms. Rosenman , Thank you for taking time to come for your Medicare Wellness Visit. I appreciate your ongoing commitment to your health goals. Please review the following plan we discussed and let me know if I can assist you in the future.   Referrals/Orders/Follow-Ups/Clinician Recommendations: none  This is a list of the screening recommended for you and due dates:  Health Maintenance  Topic Date Due   Yearly kidney health urinalysis for diabetes  01/21/2023   Hemoglobin A1C  02/07/2023   COVID-19 Vaccine (5 - 2024-25 season) 05/28/2023   DTaP/Tdap/Td vaccine (2 - Td or Tdap) 06/23/2023   Eye exam for diabetics  07/29/2023   Yearly kidney function blood test for diabetes  08/10/2023   Complete foot exam   08/05/2023   Medicare Annual Wellness Visit  07/31/2024   Pneumonia Vaccine  Completed   Flu Shot  Completed   DEXA scan (bone density measurement)  Completed   Hepatitis C Screening  Completed   Zoster (Shingles) Vaccine  Completed   HPV Vaccine  Aged Out   Cologuard (Stool DNA test)  Discontinued    Advanced directives: (Copy Requested) Please bring a copy of your health care power of attorney and living will to the office to be added to your chart at your convenience.  Next Medicare Annual Wellness Visit scheduled for next year: Yes  insert Preventive Care attachment Insert FALL PREVENTION attachment if needed

## 2023-08-10 ENCOUNTER — Encounter: Payer: 59 | Admitting: Nurse Practitioner

## 2023-08-16 ENCOUNTER — Other Ambulatory Visit: Payer: Self-pay | Admitting: Nurse Practitioner

## 2023-08-16 DIAGNOSIS — I10 Essential (primary) hypertension: Secondary | ICD-10-CM

## 2023-08-26 DIAGNOSIS — H04123 Dry eye syndrome of bilateral lacrimal glands: Secondary | ICD-10-CM | POA: Diagnosis not present

## 2023-08-26 DIAGNOSIS — H26491 Other secondary cataract, right eye: Secondary | ICD-10-CM | POA: Diagnosis not present

## 2023-08-26 DIAGNOSIS — E119 Type 2 diabetes mellitus without complications: Secondary | ICD-10-CM | POA: Diagnosis not present

## 2023-08-26 LAB — HM DIABETES EYE EXAM

## 2023-08-30 ENCOUNTER — Encounter (HOSPITAL_COMMUNITY): Payer: Self-pay

## 2023-08-30 ENCOUNTER — Ambulatory Visit (HOSPITAL_COMMUNITY): Admission: EM | Admit: 2023-08-30 | Discharge: 2023-08-30 | Disposition: A | Discharge status: Home / Self Care

## 2023-08-30 DIAGNOSIS — K529 Noninfective gastroenteritis and colitis, unspecified: Secondary | ICD-10-CM

## 2023-08-30 MED ORDER — ONDANSETRON 4 MG PO TBDP
ORAL_TABLET | ORAL | Status: AC
  Filled 2023-08-30: qty 1

## 2023-08-30 MED ORDER — ONDANSETRON 4 MG PO TBDP
4.0000 mg | ORAL_TABLET | Freq: Once | ORAL | Status: AC
  Administered 2023-08-30: 4 mg via ORAL

## 2023-08-30 MED ORDER — ONDANSETRON 4 MG PO TBDP: 4.0000 mg | ORAL_TABLET | Freq: Three times a day (TID) | ORAL | 0 refills | Status: AC | PRN

## 2023-08-30 NOTE — ED Triage Notes (Signed)
 Pt c/o abdominal cramping and vomiting x2 since Saturday after eating tuna fish. Hx of gastritis and feels the same. Took Pepto and Miralax with no relief.

## 2023-08-30 NOTE — Discharge Instructions (Addendum)
 Gastroenteritis -Ondansetron 4 mg tablet given in urgent care for nausea, symptoms seem slightly improved. -EKG performed in UC for epigastric abdominal pain shows normal sinus rhythm with a ventricular rate of 79 beats a minute. -Patient prescribed ondansetron 4 mg tablet every 8 hours as needed for nausea symptoms secondary to gastroenteritis. -Drink plenty of fluids eat a mechanical soft diet to clear liquid diet to minimize gastritis and further vomiting. -Advised to continue monitoring symptoms for any change in severity if there is any escalation of symptoms follow-up in the emergency department for further evaluation and management.

## 2023-08-30 NOTE — ED Provider Notes (Signed)
 [facility]  Note:  This document was prepared using Dragon voice recognition software and may include unintentional dictation errors.  MRN: [id number] DOB: 01-13-1946  Subjective:   Penny Gibson is a 78 y.o. female presenting for epigastric abdominal pain, nausea and vomiting, diarrhea x 2 days.  Patient reports that she began having symptoms Saturday night after eating tuna fish.  Patient has history of gastritis and says that symptoms feel very similar to past gastritis episodes.  Patient has taken Pepto-Bismol and MiraLAX with minimal relief.  Patient reports that she is unable to keep anything down, has drink water but throws up soon after.  Patient denies any chest pain, shortness of breath, weakness, dizziness.  No current facility-administered medications for this encounter.  Current Outpatient Medications:    ondansetron (ZOFRAN-ODT) 4 MG disintegrating tablet, Take 1 tablet (4 mg total) by mouth every 8 (eight) hours as needed for nausea or vomiting., Disp: 20 tablet, Rfl: 0   acetaminophen (TYLENOL) 500 MG tablet, Take 500 mg by mouth every 6 (six) hours as needed for moderate pain., Disp: , Rfl:    albuterol (VENTOLIN HFA) 108 (90 Base) MCG/ACT inhaler, USE 1 TO 2 INHALATIONS BY MOUTH  INTO THE LUNGS EVERY 6 HOURS AS  NEEDED FOR SHORTNESS OF BREATH  OR WHEEZING, Disp: 6.7 g, Rfl: 1   amLODipine (NORVASC) 5 MG tablet, TAKE 1 TABLET(5 MG) BY MOUTH EVERY EVENING (Patient not taking: Reported on 08/01/2023), Disp: 90 tablet, Rfl: 1   ammonium lactate (AMLACTIN) 12 % lotion, Apply 1 application topically as needed for dry skin. (Patient not taking: Reported on 08/01/2023), Disp: 400 g, Rfl: 0   benzonatate (TESSALON) 100 MG capsule, Take 1 capsule (100 mg total) by mouth 3 (three) times daily as needed. (Patient not taking: Reported on 08/01/2023), Disp: 21 capsule, Rfl: 0   Cholecalciferol (VITAMIN D3) 125 MCG (5000 UT) TABS, Take 5,000 Units by mouth daily., Disp: , Rfl:     Iron, Ferrous Sulfate, 325 (65 Fe) MG TABS, Take 325 mg by mouth in the morning and at bedtime. (Patient not taking: Reported on 08/01/2023), Disp: 60 tablet, Rfl: 2   lisinopril (ZESTRIL) 40 MG tablet, TAKE 1 TABLET BY MOUTH DAILY, Disp: 100 tablet, Rfl: 2   methylPREDNISolone (MEDROL DOSEPAK) 4 MG TBPK tablet, Take as directed on package (Patient not taking: Reported on 08/01/2023), Disp: 21 tablet, Rfl: 0   montelukast (SINGULAIR) 10 MG tablet, TAKE 1 TABLET BY MOUTH AT  BEDTIME, Disp: 100 tablet, Rfl: 2   pravastatin (PRAVACHOL) 20 MG tablet, Take 1 tablet (20 mg total) by mouth daily. (Patient not taking: Reported on 08/01/2023), Disp: 30 tablet, Rfl: 5   sennosides-docusate sodium (SENOKOT-S) 8.6-50 MG tablet, Take 1 tablet by mouth at bedtime. (Patient not taking: Reported on 08/01/2023), Disp: 90 tablet, Rfl: 1   Allergies  Allergen Reactions   Feraheme [Ferumoxytol] Anaphylaxis   Penicillins Anaphylaxis   Allopurinol Nausea Only   Atorvastatin Other (See Comments)    dizziness   Citric Acid Rash    Past Medical History:  Diagnosis Date   Anemia    Arthritis    Asthma    Constipation    Diabetes mellitus without complication (HCC)    Dyspnea    Hyperlipidemia    Hypertension    Insomnia    Joint pain    Knee pain    Lactose intolerance    Lower extremity edema    Multiple food allergies    Osteoarthritis  Sinus problem      Past Surgical History:  Procedure Laterality Date   BREAST BIOPSY Left 2010   BREAST LUMPECTOMY Left 2010   BREAST LUMPECTOMY WITH RADIOACTIVE SEED LOCALIZATION Right 09/19/2020   Procedure: RIGHT BREAST LUMPECTOMY WITH RADIOACTIVE SEED LOCALIZATION;  Surgeon: Griselda Miner, MD;  Location: [facility];  Service: General;  Laterality: Right;   BREAST SURGERY Right 09/19/2020   EYE SURGERY Bilateral    cataract surgery   REPLACEMENT TOTAL KNEE  03/10/2020   TOTAL KNEE ARTHROPLASTY Left 03/10/2020   Procedure: LEFT TOTAL KNEE  ARTHROPLASTY;  Surgeon: Tarry Kos, MD;  Location: [facility];  Service: Orthopedics;  Laterality: Left;   TUBAL LIGATION      Family History  Problem Relation Age of Onset   Heart attack Mother    Heart disease Mother    Sudden death Mother    Obesity Mother    Cancer Sister    Breast cancer Sister 52   Stroke Maternal Grandmother    Sudden death Father     Social History   Tobacco Use   Smoking status: Never   Smokeless tobacco: Never  Vaping Use   Vaping status: Never Used  Substance Use Topics   Alcohol use: No   Drug use: No    ROS Refer to HPI for ROS details.  Objective:   Vitals: BP 136/89 (BP Location: Left Arm)   Pulse 92   Temp 98.4 F (36.9 C) (Oral)   Resp 18   SpO2 95%   Physical Exam Vitals and nursing note reviewed.  Constitutional:      General: She is not in acute distress.    Appearance: She is well-developed. She is not ill-appearing or toxic-appearing.  HENT:     Head: Normocephalic.  Eyes:     Conjunctiva/sclera: Conjunctivae normal.  Cardiovascular:     Rate and Rhythm: Normal rate and regular rhythm.     Heart sounds: No murmur heard. Pulmonary:     Effort: Pulmonary effort is normal. No respiratory distress.     Breath sounds: Normal breath sounds. No stridor. No wheezing, rhonchi or rales.  Abdominal:     General: Bowel sounds are normal. There is distension.     Palpations: Abdomen is soft.     Tenderness: There is abdominal tenderness in the right upper quadrant, epigastric area and left upper quadrant.  Musculoskeletal:        General: No swelling.     Cervical back: Neck supple.  Skin:    General: Skin is warm and dry.  Neurological:     General: No focal deficit present.     Mental Status: She is alert and oriented to person, place, and time.  Psychiatric:        Mood and Affect: Mood normal.        Behavior: Behavior normal.     Procedures  No results found for this or any previous visit  (from the past 24 hours).  Assessment and Plan :   PDMP not reviewed this encounter.  1. Noninfectious gastroenteritis, unspecified type     Gastroenteritis -Ondansetron 4 mg tablet given in urgent care for nausea, symptoms seem slightly improved. -EKG performed in UC for epigastric abdominal pain shows normal sinus rhythm with a ventricular rate of 79 beats a minute. -Patient prescribed ondansetron 4 mg tablet every 8 hours as needed for nausea symptoms secondary to gastroenteritis. -Drink plenty of fluids eat a mechanical soft diet  to clear liquid diet to minimize gastritis and further vomiting. -Advised to continue monitoring symptoms for any change in severity if there is any escalation of symptoms follow-up in the emergency department for further evaluation and management.  Lucky Cowboy   Garrett, [place] B, [place] 08/30/23 1038

## 2023-09-06 ENCOUNTER — Ambulatory Visit (INDEPENDENT_AMBULATORY_CARE_PROVIDER_SITE_OTHER): Admitting: Orthopaedic Surgery

## 2023-09-06 ENCOUNTER — Encounter: Payer: Self-pay | Admitting: Orthopaedic Surgery

## 2023-09-06 DIAGNOSIS — M1711 Unilateral primary osteoarthritis, right knee: Secondary | ICD-10-CM

## 2023-09-06 MED ORDER — LIDOCAINE HCL 1 % IJ SOLN
2.0000 mL | INTRAMUSCULAR | Status: AC | PRN
  Administered 2023-09-06: 2 mL

## 2023-09-06 MED ORDER — METHYLPREDNISOLONE ACETATE 40 MG/ML IJ SUSP
40.0000 mg | INTRAMUSCULAR | Status: AC | PRN
  Administered 2023-09-06: 40 mg via INTRA_ARTICULAR

## 2023-09-06 MED ORDER — BUPIVACAINE HCL 0.5 % IJ SOLN
2.0000 mL | INTRAMUSCULAR | Status: AC | PRN
Start: 2023-09-06 — End: 2023-09-06
  Administered 2023-09-06: 2 mL via INTRA_ARTICULAR

## 2023-09-06 NOTE — Progress Notes (Signed)
 Office Visit Note   Patient: Penny Gibson           Date of Birth: 01/10/1946           MRN: [id number] Visit Date: 09/06/2023              Requested by: Anne Ng, NP [id number] [place],  [place] [address] PCP: Anne Ng, NP   Assessment & Plan: Visit Diagnoses:  1. Primary osteoarthritis of right knee     Plan: Penny Gibson is a 78 year old female with right knee DJD.  Cortisone injection repeated today.  She tolerated this well.  Follow-up as needed.  Follow-Up Instructions: No follow-ups on file.   Orders:  Orders Placed This Encounter  Procedures   Large Joint Inj   No orders of the defined types were placed in this encounter.     Procedures: Large Joint Inj: R knee on 09/06/2023 9:56 AM Indications: pain Details: 22 G needle  Arthrogram: No  Medications: 40 mg methylPREDNISolone acetate 40 MG/ML; 2 mL lidocaine 1 %; 2 mL bupivacaine 0.5 % Consent was given by the patient. Patient was prepped and draped in the usual sterile fashion.       Clinical Data: No additional findings.   Subjective: Chief Complaint  Patient presents with   Right Knee - Pain    HPI Penny Gibson comes in today for follow-up evaluation right knee pain from DJD.  Requesting cortisone injection. Review of Systems   Objective: Vital Signs: There were no vitals taken for this visit.  Physical Exam  Ortho Exam Semination right knee is unchanged from prior visit. Specialty Comments:  No specialty comments available.  Imaging: No results found.   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 03/26/2021   History of breast cancer in female 08/27/2020   Type 2 diabetes mellitus with diabetic chronic kidney disease (HCC) 07/18/2020   Vitamin D deficiency 07/18/2020   Hyperlipidemia associated with type 2 diabetes mellitus (HCC) 07/18/2020   Status post total left knee replacement 03/10/2020   Cardiomegaly 06/27/2018   Iron deficiency anemia 07/19/2017   Primary osteoarthritis of left knee 12/31/2016   Asthma 05/28/2016   Other seasonal allergic rhinitis 07/26/2014   Atypical squamous cells of undetermined significance (ASCUS) on Papanicolaou smear of cervix 01/31/2014   Essential hypertension 09/19/2013   Generalized osteoarthritis of multiple sites    Past Medical History:  Diagnosis Date   Anemia    Arthritis    Asthma    Constipation    Diabetes mellitus without complication (HCC)    Dyspnea    Hyperlipidemia    Hypertension    Insomnia    Joint pain    Knee pain    Lactose intolerance    Lower extremity edema    Multiple food allergies    Osteoarthritis    Sinus problem     Family History  Problem Relation Age of Onset   Heart attack Mother    Heart disease Mother    Sudden death Mother    Obesity Mother    Cancer Sister  Breast cancer Sister 105   Stroke Maternal Grandmother    Sudden death Father     Past Surgical History:  Procedure Laterality Date   BREAST BIOPSY Left 2010   BREAST LUMPECTOMY Left 2010   BREAST LUMPECTOMY WITH RADIOACTIVE SEED LOCALIZATION Right 09/19/2020   Procedure: RIGHT BREAST LUMPECTOMY WITH RADIOACTIVE SEED LOCALIZATION;  Surgeon: Griselda Miner, MD;  Location: [facility];  Service: General;  Laterality: Right;   BREAST SURGERY Right 09/19/2020   EYE SURGERY Bilateral    cataract surgery   REPLACEMENT TOTAL KNEE  03/10/2020   TOTAL KNEE ARTHROPLASTY Left 03/10/2020   Procedure: LEFT TOTAL KNEE ARTHROPLASTY;  Surgeon: Tarry Kos, MD;  Location: [facility];  Service: Orthopedics;  Laterality: Left;   TUBAL LIGATION     Social History   Occupational History   Not on file  Tobacco Use   Smoking status: Never   Smokeless tobacco: Never  Vaping Use   Vaping status: Never Used  Substance and Sexual Activity   Alcohol use: No   Drug use: No   Sexual activity: Not Currently    Birth control/protection: Surgical

## 2023-09-30 ENCOUNTER — Encounter: Payer: Self-pay | Admitting: Nurse Practitioner

## 2023-09-30 ENCOUNTER — Ambulatory Visit (INDEPENDENT_AMBULATORY_CARE_PROVIDER_SITE_OTHER): Payer: 59 | Admitting: Nurse Practitioner

## 2023-09-30 VITALS — BP 140/90 | HR 90 | Temp 98.5°F | Ht 60.63 in | Wt 231.4 lb

## 2023-09-30 DIAGNOSIS — Z0000 Encounter for general adult medical examination without abnormal findings: Secondary | ICD-10-CM

## 2023-09-30 DIAGNOSIS — B351 Tinea unguium: Secondary | ICD-10-CM

## 2023-09-30 DIAGNOSIS — E1169 Type 2 diabetes mellitus with other specified complication: Secondary | ICD-10-CM

## 2023-09-30 DIAGNOSIS — J4521 Mild intermittent asthma with (acute) exacerbation: Secondary | ICD-10-CM

## 2023-09-30 DIAGNOSIS — Z0001 Encounter for general adult medical examination with abnormal findings: Secondary | ICD-10-CM

## 2023-09-30 DIAGNOSIS — N3946 Mixed incontinence: Secondary | ICD-10-CM

## 2023-09-30 DIAGNOSIS — N1831 Chronic kidney disease, stage 3a: Secondary | ICD-10-CM

## 2023-09-30 DIAGNOSIS — E785 Hyperlipidemia, unspecified: Secondary | ICD-10-CM

## 2023-09-30 DIAGNOSIS — I10 Essential (primary) hypertension: Secondary | ICD-10-CM

## 2023-09-30 DIAGNOSIS — J302 Other seasonal allergic rhinitis: Secondary | ICD-10-CM

## 2023-09-30 DIAGNOSIS — D509 Iron deficiency anemia, unspecified: Secondary | ICD-10-CM | POA: Diagnosis not present

## 2023-09-30 DIAGNOSIS — E1122 Type 2 diabetes mellitus with diabetic chronic kidney disease: Secondary | ICD-10-CM | POA: Diagnosis not present

## 2023-09-30 DIAGNOSIS — E790 Hyperuricemia without signs of inflammatory arthritis and tophaceous disease: Secondary | ICD-10-CM

## 2023-09-30 LAB — LIPID PANEL
Cholesterol: 195 mg/dL (ref 0–200)
HDL: 56.2 mg/dL (ref >39.00)
LDL Cholesterol: 120 mg/dL — ABNORMAL HIGH (ref 0–99)
NonHDL: 138.88
Total CHOL/HDL Ratio: 3
Triglycerides: 92 mg/dL (ref 0.0–149.0)
VLDL: 18.4 mg/dL (ref 0.0–40.0)

## 2023-09-30 LAB — COMPREHENSIVE METABOLIC PANEL WITH GFR
ALT: 7 U/L (ref 0–35)
AST: 11 U/L (ref 0–37)
Albumin: 4.1 g/dL (ref 3.5–5.2)
Alkaline Phosphatase: 85 U/L (ref 39–117)
BUN: 15 mg/dL (ref 6–23)
CO2: 29 meq/L (ref 19–32)
Calcium: 8.9 mg/dL (ref 8.4–10.5)
Chloride: 103 meq/L (ref 96–112)
Creatinine, Ser: 0.88 mg/dL (ref 0.40–1.20)
GFR: 63.86 mL/min (ref >60.00)
Glucose, Bld: 98 mg/dL (ref 70–99)
Potassium: 4.4 meq/L (ref 3.5–5.1)
Sodium: 141 meq/L (ref 135–145)
Total Bilirubin: 0.4 mg/dL (ref 0.2–1.2)
Total Protein: 8 g/dL (ref 6.0–8.3)

## 2023-09-30 LAB — CBC WITH DIFFERENTIAL/PLATELET
Basophils Absolute: 0 10*3/uL (ref 0.0–0.1)
Basophils Relative: 0.5 % (ref 0.0–3.0)
Eosinophils Absolute: 0.2 10*3/uL (ref 0.0–0.7)
Eosinophils Relative: 1.7 % (ref 0.0–5.0)
HCT: 30.7 % — ABNORMAL LOW (ref 36.0–46.0)
Hemoglobin: 9.6 g/dL — ABNORMAL LOW (ref 12.0–15.0)
Lymphocytes Relative: 18.3 % (ref 12.0–46.0)
Lymphs Abs: 1.6 10*3/uL (ref 0.7–4.0)
MCHC: 31.3 g/dL (ref 30.0–36.0)
MCV: 81.3 fl (ref 78.0–100.0)
Monocytes Absolute: 0.5 10*3/uL (ref 0.1–1.0)
Monocytes Relative: 6 % (ref 3.0–12.0)
Neutro Abs: 6.4 10*3/uL (ref 1.4–7.7)
Neutrophils Relative %: 73.5 % (ref 43.0–77.0)
Platelets: 280 10*3/uL (ref 150.0–400.0)
RBC: 3.77 Mil/uL — ABNORMAL LOW (ref 3.87–5.11)
RDW: 19.9 % — ABNORMAL HIGH (ref 11.5–15.5)
WBC: 8.8 10*3/uL (ref 4.0–10.5)

## 2023-09-30 LAB — MICROALBUMIN / CREATININE URINE RATIO
Creatinine,U: 122.9 mg/dL
Microalb Creat Ratio: 16.5 mg/g (ref 0.0–30.0)
Microalb, Ur: 2 mg/dL — ABNORMAL HIGH (ref 0.0–1.9)

## 2023-09-30 LAB — URIC ACID: Uric Acid, Serum: 6.8 mg/dL (ref 2.4–7.0)

## 2023-09-30 LAB — HEMOGLOBIN A1C: Hgb A1c MFr Bld: 6.6 % — ABNORMAL HIGH (ref 4.6–6.5)

## 2023-09-30 LAB — TSH: TSH: 1.96 u[IU]/mL (ref 0.35–5.50)

## 2023-09-30 MED ORDER — AMLODIPINE BESYLATE 5 MG PO TABS
5.0000 mg | ORAL_TABLET | Freq: Every day | ORAL | 3 refills | Status: AC
Start: 2023-09-30 — End: ?

## 2023-09-30 MED ORDER — ALBUTEROL SULFATE HFA 108 (90 BASE) MCG/ACT IN AERS: 2.0000 | INHALATION_SPRAY | Freq: Four times a day (QID) | RESPIRATORY_TRACT | 1 refills | Status: DC | PRN

## 2023-09-30 MED ORDER — IRON (FERROUS SULFATE) 325 (65 FE) MG PO TABS
325.0000 mg | ORAL_TABLET | Freq: Two times a day (BID) | ORAL | 1 refills | Status: AC
Start: 2023-09-30 — End: ?

## 2023-09-30 MED ORDER — MONTELUKAST SODIUM 10 MG PO TABS: 10.0000 mg | ORAL_TABLET | Freq: Every day | ORAL | 2 refills | Status: AC

## 2023-09-30 NOTE — Assessment & Plan Note (Signed)
 Agreed to podiatry referral

## 2023-09-30 NOTE — Assessment & Plan Note (Signed)
 Repeat lipid panel ?

## 2023-09-30 NOTE — Assessment & Plan Note (Signed)
 [facility] to promote [facility]ization and physical activity. Advised about correlation between obesity and urinary incontinence, HYPERTENSION, CAD, DIABETES, and OA. Wt Readings from Last 3 Encounters:  09/30/23 231 lb 6.4 oz (105 kg)  12/16/22 219 lb 3.2 oz (99.4 kg)  08/04/22 212 lb (96.2 kg)    She promised to [facility].

## 2023-09-30 NOTE — Assessment & Plan Note (Addendum)
 No acute exacerbation Unable to tolerate allopurinol (nausea) Repeat uric acid and renal function

## 2023-09-30 NOTE — Assessment & Plan Note (Signed)
Denies any melena or hematuria Negative ifob 06/2022 Current use of oral iron supplement with colace Repeat cbc and iron panel

## 2023-09-30 NOTE — Patient Instructions (Signed)
 Go to lab Maintain low sodium/salt and daily exercise. Avoid water intake 3hrs to bedtime Avoid caffeine Maintain current medications.  Urinary Incontinence in Females: How to Manage Urinary incontinence, or UI, happens if you can't always control when you pee. If you think you have UI, it's important to talk to your health care provider. How does UI affect me? UI can make it hard to enjoy daily activities. It can affect your social life. It can also affect your mental health. What actions can I take to manage UI? Talk to your provider. There're things you can do and treatments that can help. Change your lifestyle  Quit smoking. Do not smoke, vape, or use nicotine or tobacco. Lose weight or keep a healthy weight. Do pelvic floor muscle exercises as told. You may hear these called Kegel exercises. Stay active. Eat a healthy diet. Change your behavior Try bladder training. This may include using the bathroom at set times during the day. Use the bathroom every 3-4 hours, even if you don't feel the need to pee. Try to empty your bladder of all pee every time you go. After peeing, wait a minute. Then try to pee again. Find ways to reduce bladder urges. This can include distraction techniques or controlled breathing exercises. Make sure you're in a relaxed position while peeing. If needed, wear pads to absorb pee that leaks. Get treatment Treatment for UI depends on the type of incontinence that you have and its cause. This may include: Medicines to relax the bladder muscles. Treatments, such as: Pulses of electricity to help change bladder reflexes (electrical nerve stimulation). A shot of collagen or carbon beads into the bladder opening. This can help thicken tissue and close the bladder opening. Botox injections to relax the bladder muscles. Surgery. Products such as: A pessary. This is a device to prevent pee leaks. It's placed in your vagina to help support your pelvic floor  muscles. A catheter. This is a soft tube that's put into your urethra to drain pee from the bladder. The catheter may be connected to a bag that collects pee. Portable commodes, bedpans, or urinals.  Follow these instructions at home: Eating and drinking Change your diet as told. You may be asked to: Drink fluids in small amounts throughout the day instead of large amounts at one time. Use less caffeine or alcohol. Eat foods that are high in fiber, such as beans, whole grains, or fresh fruits and vegetables. General instructions Take your medicines only as told. Keep all follow-up visits. Your provider may need to change your treatment plan if needed. Where to find more information The Office on Women's Health: [employer]Less[employer].ca The [employer]necologists (ACOG): acog.org Contact a health care provide if: Your symptoms don't get better after treatment. Your symptoms get worse. You have new symptoms. This information is not intended to replace advice given to you by your health care provider. Make sure you discuss any questions you have with your health care provider. Document Revised: 05/11/2023 Document Reviewed: 03/10/2023 Elsevier Patient Education  2024 [employer].

## 2023-09-30 NOTE — Assessment & Plan Note (Signed)
 Controlled with diet Negative retinopathy and neuropathy Normal UACr  Repeat hgbA1c and CMP today Encouraged to maintain heart healthy diet and daily exercise F/up in 3months

## 2023-09-30 NOTE — Assessment & Plan Note (Addendum)
 BP not at goal with lisinopril 40mg  and amlodipine 5mg  due to med non compliance x 2days BP Readings from Last 3 Encounters:  09/30/23 (!) 140/90  08/30/23 136/89  12/16/22 (!) 150/90    Advised about possible adverse effects of uncontrolled HTN Advised to maintain DASh diet and med doses F/up in 1-52month

## 2023-09-30 NOTE — Progress Notes (Signed)
 Complete physical exam  Patient: Penny Gibson   DOB: [birth year]   78 y.o. Female  MRN: [id number] Visit Date: 09/30/2023  Subjective:    Chief Complaint  Patient presents with   Annual Exam    Inhaler refilled   Penny Gibson is a 78 y.o. female who presents today for a complete physical exam. She reports consuming a general diet.  No exercise regimen  She generally feels fairly well. She reports sleeping poorly due to nocturia. She does have additional problems to discuss today.  Vision:Yes Dental: upper and lower dentures STD Screen:No  BP Readings from Last 3 Encounters:  09/30/23 (!) 140/90  08/30/23 136/89  12/16/22 (!) 150/90   Wt Readings from Last 3 Encounters:  09/30/23 231 lb 6.4 oz (105 kg)  12/16/22 219 lb 3.2 oz (99.4 kg)  08/04/22 212 lb (96.2 kg)   Most recent fall risk assessment:    09/30/2023   10:06 AM  Fall Risk   Falls in the past year? 0  Number falls in past yr: 0  Injury with Fall? 0  Risk for fall due to : No Fall Risks  Follow up Falls evaluation completed   Depression screen:Yes - Depression  Most recent depression screenings:    09/30/2023   10:06 AM 08/01/2023   10:19 AM  PHQ 2/9 Scores  PHQ - 2 Score 3 0  PHQ- 9 Score 5 3    HPI  Obesity, morbid (HCC) [facility] to promote socialization and physical activity. Advised about correlation between obesity and urinary incontinence, HYPERTENSION, CAD, DIABETES, and OA. Wt Readings from Last 3 Encounters:  09/30/23 231 lb 6.4 oz (105 kg)  12/16/22 219 lb 3.2 oz (99.4 kg)  08/04/22 212 lb (96.2 kg)    She [facility].  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 No acute exacerbation Unable to tolerate allopurinol (nausea) Repeat uric acid and renal function   Iron deficiency anemia Denies any melena or hematuria Negative ifob 06/2022 Current use of oral  iron supplement with colace Repeat cbc and iron panel  Pain due to onychomycosis of toenails of both feet Agreed to podiatry referral  Hyperlipidemia associated with type 2 diabetes mellitus (HCC) Repeat lipid panel  Type 2 diabetes mellitus with diabetic chronic kidney disease (HCC) Controlled with diet Negative retinopathy and neuropathy Normal UACr  Repeat hgbA1c and CMP today Encouraged to maintain heart healthy diet and daily exercise F/up in 3months  Essential hypertension BP not at goal with lisinopril 40mg  and amlodipine 5mg  due to med non compliance x 2days BP Readings from Last 3 Encounters:  09/30/23 (!) 140/90  08/30/23 136/89  12/16/22 (!) 150/90    Advised about possible adverse effects of uncontrolled HTN Advised to maintain DASh diet and med doses F/up in 1-10month  Mixed stress and urge urinary incontinence Avoid water intake 3hrs to bedtime Avoid caffeine Agreed to pelvic floor rehab referral   Past Medical History:  Diagnosis Date   Anemia    Arthritis    Asthma    Constipation    Diabetes mellitus without complication (HCC)    Dyspnea    Hyperlipidemia    Hypertension    Insomnia    Joint pain    Knee pain    Lactose intolerance    Lower extremity edema    Multiple food allergies    Osteoarthritis  Sinus problem    Past Surgical History:  Procedure Laterality Date   BREAST BIOPSY Left 2010   BREAST LUMPECTOMY Left 2010   BREAST LUMPECTOMY WITH RADIOACTIVE SEED LOCALIZATION Right 09/19/2020   Procedure: RIGHT BREAST LUMPECTOMY WITH RADIOACTIVE SEED LOCALIZATION;  Surgeon: Griselda Miner, MD;  Location: [facility];  Service: General;  Laterality: Right;   BREAST SURGERY Right 09/19/2020   EYE SURGERY Bilateral    cataract surgery   REPLACEMENT TOTAL KNEE  03/10/2020   TOTAL KNEE ARTHROPLASTY Left 03/10/2020   Procedure: LEFT TOTAL KNEE ARTHROPLASTY;  Surgeon: Tarry Kos, MD;  Location: [facility];   Service: Orthopedics;  Laterality: Left;   TUBAL LIGATION     Social History   Socioeconomic History   Marital status: Divorced    Spouse name: Not on file   Number of children: 2   Years of education: Not on file   Highest education level: Not on file  Occupational History   Not on file  Tobacco Use   Smoking status: Never   Smokeless tobacco: Never  Vaping Use   Vaping status: Never Used  Substance and Sexual Activity   Alcohol use: No   Drug use: No   Sexual activity: Not Currently    Birth control/protection: Surgical  Other Topics Concern   Not on file  Social History Narrative   Not on file   Social Drivers of Health   Financial Resource Strain: Low Risk  (08/01/2023)   Overall Financial Resource Strain (CARDIA)    Difficulty of Paying Living Expenses: Not hard at all  Food Insecurity: No Food Insecurity (08/01/2023)   Hunger Vital Sign    Worried About Running Out of Food in the Last Year: Never true    Ran Out of Food in the Last Year: Never true  Transportation Needs: No Transportation Needs (08/01/2023)   PRAPARE - Administrator, Civil Service (Medical): No    Lack of Transportation (Non-Medical): No  Physical Activity: Inactive (08/01/2023)   Exercise Vital Sign    Days of Exercise per Week: 0 days    Minutes of Exercise per Session: 0 min  Stress: No Stress Concern Present (08/01/2023)   [employer] of Occupational Health - Occupational Stress Questionnaire    Feeling of Stress : Only a little  Social Connections: Moderately Isolated (08/01/2023)   Social Connection and Isolation Panel [NHANES]    Frequency of Communication with Friends and Family: More than three times a week    Frequency of Social Gatherings with Friends and Family: Three times a week    Attends Religious Services: More than 4 times per year    Active Member of Clubs or Organizations: No    Attends Banker Meetings: Never    Marital Status: Divorced   Catering manager Violence: Not At Risk (08/01/2023)   Humiliation, Afraid, Rape, and Kick questionnaire    Fear of Current or Ex-Partner: No    Emotionally Abused: No    Physically Abused: No    Sexually Abused: No   Family Status  Relation Name Status   Mother  Deceased at age 59       heart attack   Sister Steward Drone Deceased at age 28       breast cancer   MGM  Alive   Father  Deceased at age 40       car accident   Daughter [place] Alive   Son [employer]  Sister Kathie Rhodes Alive   Sister Sidney Alive   Sister Bernadine Alive   Sister Sheralyn Boatman Alive   Sister Occupational psychologist  No partnership data on file   Family History  Problem Relation Age of Onset   Heart attack Mother    Heart disease Mother    Sudden death Mother    Obesity Mother    Cancer Sister    Breast cancer Sister 75   Stroke Maternal Grandmother    Sudden death Father    Allergies  Allergen Reactions   Feraheme [Ferumoxytol] Anaphylaxis   Penicillins Anaphylaxis   Allopurinol Nausea Only   Atorvastatin Other (See Comments)    dizziness   Citric Acid Rash    Patient Care Team: Aldene Hendon, Bonna Gains, NP as PCP - General (Internal Medicine) Malachy Mood, MD as Consulting Physician (Hematology) Griselda Miner, MD as Consulting Physician (General Surgery) Dorothy Puffer, MD as Consulting Physician (Radiation Oncology) Axel Filler, Larna Daughters, NP as Nurse Practitioner (Hematology and Oncology) Maryclare Labrador, RN as Registered Nurse   Medications: Outpatient Medications Prior to Visit  Medication Sig   acetaminophen (TYLENOL) 500 MG tablet Take 500 mg by mouth every 6 (six) hours as needed for moderate pain.   ammonium lactate (AMLACTIN) 12 % lotion Apply 1 application topically as needed for dry skin.   Cholecalciferol (VITAMIN D3) 125 MCG (5000 UT) TABS Take 5,000 Units by mouth daily.   lisinopril (ZESTRIL) 40 MG tablet TAKE 1 TABLET BY MOUTH DAILY   ondansetron (ZOFRAN-ODT) 4 MG disintegrating tablet Take 1  tablet (4 mg total) by mouth every 8 (eight) hours as needed for nausea or vomiting.   pravastatin (PRAVACHOL) 20 MG tablet Take 1 tablet (20 mg total) by mouth daily.   sennosides-docusate sodium (SENOKOT-S) 8.6-50 MG tablet Take 1 tablet by mouth at bedtime.   [DISCONTINUED] albuterol (VENTOLIN HFA) 108 (90 Base) MCG/ACT inhaler USE 1 TO 2 INHALATIONS BY MOUTH  INTO THE LUNGS EVERY 6 HOURS AS  NEEDED FOR SHORTNESS OF BREATH  OR WHEEZING   [DISCONTINUED] amLODipine (NORVASC) 5 MG tablet TAKE 1 TABLET(5 MG) BY MOUTH EVERY EVENING   [DISCONTINUED] benzonatate (TESSALON) 100 MG capsule Take 1 capsule (100 mg total) by mouth 3 (three) times daily as needed.   [DISCONTINUED] Iron, Ferrous Sulfate, 325 (65 Fe) MG TABS Take 325 mg by mouth in the morning and at bedtime.   [DISCONTINUED] methylPREDNISolone (MEDROL DOSEPAK) 4 MG TBPK tablet Take as directed on package   [DISCONTINUED] montelukast (SINGULAIR) 10 MG tablet TAKE 1 TABLET BY MOUTH AT  BEDTIME   No facility-administered medications prior to visit.    Review of Systems      Objective:  BP (!) 140/90 (BP Location: Left Arm, Patient Position: Sitting, Cuff Size: Normal)   Pulse 90   Temp 98.5 F (36.9 C) (Temporal)   Ht 5' 0.63" (1.54 m)   Wt 231 lb 6.4 oz (105 kg)   SpO2 (!) 9%   BMI 44.26 kg/m     Physical Exam Vitals and nursing note reviewed.  Constitutional:      General: She is not in acute distress. HENT:     Right Ear: Tympanic membrane, ear canal and external ear normal.     Left Ear: Tympanic membrane, ear canal and external ear normal.     Nose: Nose normal.  Eyes:     Extraocular Movements: Extraocular movements intact.     Conjunctiva/sclera: Conjunctivae normal.     Pupils: Pupils are equal, round, and  reactive to light.  Neck:     Thyroid: No thyroid mass, thyromegaly or thyroid tenderness.  Cardiovascular:     Rate and Rhythm: Normal rate and regular rhythm.     Pulses: Normal pulses.     Heart sounds:  Normal heart sounds.  Pulmonary:     Effort: Pulmonary effort is normal.     Breath sounds: Normal breath sounds.  Abdominal:     General: Bowel sounds are normal.     Palpations: Abdomen is soft.  Musculoskeletal:        General: Normal range of motion.     Cervical back: Normal range of motion and neck supple.     Right lower leg: Edema present.     Left lower leg: Edema present.  Lymphadenopathy:     Cervical: No cervical adenopathy.  Skin:    General: Skin is warm and dry.  Neurological:     Mental Status: She is alert and oriented to person, place, and time.     Cranial Nerves: No cranial nerve deficit.  Psychiatric:        Mood and Affect: Mood normal.        Behavior: Behavior normal.        Thought Content: Thought content normal.      No results found for any visits on 09/30/23.    Assessment & Plan:    Routine Health Maintenance and Physical Exam  Immunization History  Administered Date(s) Administered   Fluad Quad(high Dose 65+) 03/26/2021, 04/14/2022, 04/02/2023   Influenza, High Dose Seasonal PF 03/19/2016, 04/12/2017, 04/21/2018, 04/02/2023   Influenza,inj,Quad PF,6+ Mos 07/26/2014   Influenza-Unspecified 03/21/2013, 04/21/2018, 04/01/2020, 04/21/2022   Moderna Covid-19 Fall Seasonal Vaccine 72yrs & older 04/14/2022, 04/02/2023   Moderna Sars-Covid-2 Vaccination 08/04/2019, 09/02/2019, 04/30/2020, 05/01/2020, 12/19/2020   Pneumococcal Conjugate-13 07/26/2014   Pneumococcal Polysaccharide-23 09/20/2013   Tdap 06/22/2013   Unspecified SARS-COV-2 Vaccination 04/02/2023   Zoster Recombinant(Shingrix) 04/12/2019, 06/28/2019    Health Maintenance  Topic Date Due   Diabetic kidney evaluation - Urine ACR  01/21/2023   HEMOGLOBIN A1C  02/07/2023   Diabetic kidney evaluation - eGFR measurement  08/10/2023   COVID-19 Vaccine (8 - Moderna risk 2024-25 season) 10/16/2023 (Originally 10/01/2023)   DTaP/Tdap/Td (2 - Td or Tdap) 09/29/2024 (Originally 06/23/2023)    INFLUENZA VACCINE  01/20/2024   Medicare Annual Wellness (AWV)  07/31/2024   OPHTHALMOLOGY EXAM  08/25/2024   FOOT EXAM  09/29/2024   Pneumonia Vaccine 66+ Years old  Completed   DEXA SCAN  Completed   Hepatitis C Screening  Completed   Zoster Vaccines- Shingrix  Completed   HPV VACCINES  Aged Out   Meningococcal B Vaccine  Aged Out   Fecal DNA (Cologuard)  Discontinued    Discussed health benefits of physical activity, and encouraged her to engage in regular exercise appropriate for her age and condition.  Problem List Items Addressed This Visit     Asthma   Relevant Medications   montelukast (SINGULAIR) 10 MG tablet   albuterol (VENTOLIN HFA) 108 (90 Base) MCG/ACT inhaler   Chronic kidney disease, stage 3a (HCC)   Relevant Orders   Microalbumin / creatinine urine ratio   Hemoglobin A1c   Uric acid   Essential hypertension (Chronic)   BP not at goal with lisinopril 40mg  and amlodipine 5mg  due to med non compliance x 2days BP Readings from Last 3 Encounters:  09/30/23 (!) 140/90  08/30/23 136/89  12/16/22 (!) 150/90    Advised about possible  adverse effects of uncontrolled HTN Advised to maintain DASh diet and med doses F/up in 1-50month      Relevant Medications   amLODipine (NORVASC) 5 MG tablet   Other Relevant Orders   TSH   Hyperlipidemia associated with type 2 diabetes mellitus (HCC)   Repeat lipid panel      Relevant Medications   amLODipine (NORVASC) 5 MG tablet   Other Relevant Orders   Lipid panel   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   No acute exacerbation Unable to tolerate allopurinol (nausea) Repeat uric acid and renal function       Relevant Orders   Uric acid   Iron deficiency anemia   Denies any melena or hematuria Negative ifob 06/2022 Current use of oral iron supplement with colace Repeat cbc and iron panel      Relevant Medications   Iron, Ferrous Sulfate, 325 (65 Fe) MG TABS   Other Relevant Orders    CBC with Differential/Platelet   Iron, TIBC and Ferritin Panel   Mixed stress and urge urinary incontinence   Avoid water intake 3hrs to bedtime Avoid caffeine Agreed to pelvic floor rehab referral      Relevant Orders   AMB referral to rehabilitation   Obesity, morbid (HCC)   [facility] to promote socialization and physical activity. Advised about correlation between obesity and urinary incontinence, HYPERTENSION, CAD, DIABETES, and OA. Wt Readings from Last 3 Encounters:  09/30/23 231 lb 6.4 oz (105 kg)  12/16/22 219 lb 3.2 oz (99.4 kg)  08/04/22 212 lb (96.2 kg)    She promised to [facility].      Relevant Orders   TSH   Other seasonal allergic rhinitis   Relevant Medications   montelukast (SINGULAIR) 10 MG tablet   Pain due to onychomycosis of toenails of both feet   Agreed to podiatry referral      Relevant Orders   Ambulatory referral to Podiatry   Type 2 diabetes mellitus with diabetic chronic kidney disease (HCC)   Controlled with diet Negative retinopathy and neuropathy Normal UACr  Repeat hgbA1c and CMP today Encouraged to maintain heart healthy diet and daily exercise F/up in 3months      Relevant Orders   Microalbumin / creatinine urine ratio   Ambulatory referral to Podiatry   Other Visit Diagnoses       Encounter for preventative adult health care exam with abnormal findings    -  Primary   Relevant Orders   Comprehensive metabolic panel with GFR      Return in about 3 months (around 12/30/2023) for HTN, DM, hyperlipidemia (fasting).     Alysia Penna, NP

## 2023-09-30 NOTE — Assessment & Plan Note (Signed)
 Avoid water intake 3hrs to bedtime Avoid caffeine Agreed to pelvic floor rehab referral

## 2023-10-01 LAB — IRON,TIBC AND FERRITIN PANEL
%SAT: 8 % — ABNORMAL LOW (ref 16–45)
Ferritin: 11 ng/mL — ABNORMAL LOW (ref 16–288)
Iron: 27 ug/dL — ABNORMAL LOW (ref 45–160)
TIBC: 353 ug/dL (ref 250–450)

## 2023-10-03 ENCOUNTER — Other Ambulatory Visit: Payer: Self-pay | Admitting: Nurse Practitioner

## 2023-10-03 DIAGNOSIS — E1169 Type 2 diabetes mellitus with other specified complication: Secondary | ICD-10-CM

## 2023-10-03 DIAGNOSIS — D509 Iron deficiency anemia, unspecified: Secondary | ICD-10-CM

## 2023-10-03 MED ORDER — PRAVASTATIN SODIUM 20 MG PO TABS: 20.0000 mg | ORAL_TABLET | Freq: Every evening | ORAL | 3 refills | Status: AC

## 2023-10-03 NOTE — Telephone Encounter (Signed)
 [facility] contacted, medications are currently received and [facility] is awaiting shipment for the Singulair, albuterol, and amolipine tomorrow. Pt called and informed. Pt understands, no questions at time of call.

## 2023-10-06 ENCOUNTER — Other Ambulatory Visit: Payer: Self-pay | Admitting: Nurse Practitioner

## 2023-10-06 DIAGNOSIS — J4521 Mild intermittent asthma with (acute) exacerbation: Secondary | ICD-10-CM

## 2023-10-10 NOTE — Telephone Encounter (Addendum)
 Called patient to confirm if she is using [facility] mail order pharmacy. She stated that she call the [facility] to get them involved since she had not heard back from anyone about her inhaler refilll. She stated that the local [facility] is where she wants the inhaler to go to and that she has not heard from the pharmacy or our office on the refill for the inhaler she requested.   Called patient's pharmacy [facility] and spoke with Athena Bland. I informed him the reason for my call. He stated that the inhaler is there and ready for pick. I thanked him for taking my call.  I called patient back and informed her that the inhaler is ready for pick up she thanked me for calling.

## 2023-10-11 ENCOUNTER — Encounter: Payer: Self-pay | Admitting: Podiatry

## 2023-10-11 ENCOUNTER — Ambulatory Visit (INDEPENDENT_AMBULATORY_CARE_PROVIDER_SITE_OTHER): Admitting: Podiatry

## 2023-10-11 DIAGNOSIS — M79674 Pain in right toe(s): Secondary | ICD-10-CM | POA: Diagnosis not present

## 2023-10-11 DIAGNOSIS — B351 Tinea unguium: Secondary | ICD-10-CM | POA: Diagnosis not present

## 2023-10-11 DIAGNOSIS — E1122 Type 2 diabetes mellitus with diabetic chronic kidney disease: Secondary | ICD-10-CM | POA: Diagnosis not present

## 2023-10-11 DIAGNOSIS — N1831 Chronic kidney disease, stage 3a: Secondary | ICD-10-CM | POA: Diagnosis not present

## 2023-10-11 DIAGNOSIS — M79675 Pain in left toe(s): Secondary | ICD-10-CM | POA: Diagnosis not present

## 2023-10-11 NOTE — Progress Notes (Signed)
 This patient returns to my office for at risk foot care.  This patient requires this care by a professional since this patient will be at risk due to having type 2 diabetes, CKD and type 2 diabetes.  This patient is unable to cut nails herself since the patient cannot reach her nails.These nails are painful walking and wearing shoes.  She says her doctor told her to be treated by a podiatrist to remove her gel nail polish. This patient presents for at risk foot care today.  She has not been seen for 14 months.  General Appearance  Alert, conversant and in no acute stress.  Vascular  Dorsalis pedis and posterior tibial  pulses are palpable  bilaterally.  Capillary return is within normal limits  bilaterally. Temperature is within normal limits  bilaterally.  Neurologic  Senn-Weinstein monofilament wire test within normal limits  bilaterally. Muscle power within normal limits bilaterally.  Nails Thick disfigured discolored nails with subungual debris  from hallux to fifth toes bilaterally. No evidence of bacterial infection or drainage bilaterally.  Orthopedic  No limitations of motion  feet .  No crepitus or effusions noted.  No bony pathology or digital deformities noted.  Skin  normotropic skin with no porokeratosis noted bilaterally.  No signs of infections or ulcers noted.     Onychomycosis  Pain in right toes  Pain in left toes  Consent was obtained for treatment procedures.   Mechanical debridement of nails 1-5  bilaterally performed with a nail nipper.  Filed with dremel without incident.    Return office visit    3 months                  Told patient to return for periodic foot care and evaluation due to potential at risk complications.   Ruffin Cotton DPM

## 2023-10-22 ENCOUNTER — Encounter: Payer: Self-pay | Admitting: Nurse Practitioner

## 2023-10-24 NOTE — Telephone Encounter (Signed)
 Called patient once it was discovered that patient's daughter Tarana Bocanegra who is not on DPR and is listed as an emergency contact was the one sending the MyChart message. Per patient Penny Gibson when asked what questions did she have about her labs she stated that she wanted to know what did all of the abnormal stuff mean. I read to her the comments made by [name] on her 09/30/23 lab results. She stated that she did not want to go back to the cancer center because "they made my heart stop, so I am not going back." She stated that she is eating more foods and vegetables to help with her iron  and taking iron  medications like told to do. I asked has she completed the iFob kit yet and she did not know what I was talking about and if it was something she needed to do why wasn't she given it when she was in the office. I explained to her that this was a recommendation from [place] based on the results from that visit blood work so it came after the office visit. She stated that she would not be able to come to the office to pick it up due to not having her own transportation. I informed her that if she can get someone to pick it up for her that the instructions were in the kit.   When I asked patient if she is willing to do the things that [name] recommended she stated no she is not because she thought [place] knew why she did not want to go back there to the cancer center. I informed her that I will see her at her appointment in July with [name] and to give the office a call if she has any other questions.

## 2023-10-25 ENCOUNTER — Telehealth: Payer: Self-pay

## 2023-10-25 NOTE — Telephone Encounter (Signed)
VOB submitted for Euflexxa, right knee  

## 2023-10-25 NOTE — Telephone Encounter (Signed)
 Returned call to patient. She asked if [name] could look into an alternative other than her having to go back to the cancer center. I informed her that I will pass her question/concern to [facility] and that someone will get back with her. She thanked me for calling her back.

## 2023-10-25 NOTE — Telephone Encounter (Signed)
 Copied from CRM [id number]. Topic: General - Other >> Oct 24, 2023  2:46 PM Penny Gibson wrote: Reason for CRM: Patient called requesting to speak with the NP's nurse regarding a recent infusion received at the [facility]. She stated she was not pleased and is requesting an alternative option from the provider.  Callback Number: [phone]

## 2023-10-26 ENCOUNTER — Telehealth: Payer: Self-pay

## 2023-10-26 NOTE — Telephone Encounter (Signed)
 Called and left a VM advising patient that Euflexxa is a non preferred product with her insurance and submitted for Durolane, right knee

## 2023-10-28 NOTE — Telephone Encounter (Signed)
 Called patient and informed her of provider comment. She thanked me for calling.

## 2023-11-01 ENCOUNTER — Other Ambulatory Visit: Payer: Self-pay

## 2023-11-01 DIAGNOSIS — M1711 Unilateral primary osteoarthritis, right knee: Secondary | ICD-10-CM

## 2023-11-02 ENCOUNTER — Encounter: Payer: Self-pay | Admitting: Orthopaedic Surgery

## 2023-11-02 ENCOUNTER — Telehealth: Payer: Self-pay

## 2023-11-02 ENCOUNTER — Ambulatory Visit (INDEPENDENT_AMBULATORY_CARE_PROVIDER_SITE_OTHER): Admitting: Orthopaedic Surgery

## 2023-11-02 DIAGNOSIS — M1711 Unilateral primary osteoarthritis, right knee: Secondary | ICD-10-CM

## 2023-11-02 MED ORDER — SODIUM HYALURONATE 60 MG/3ML IX PRSY
60.0000 mg | PREFILLED_SYRINGE | INTRA_ARTICULAR | Status: AC | PRN
  Administered 2023-11-02: 60 mg via INTRA_ARTICULAR

## 2023-11-02 NOTE — Telephone Encounter (Signed)
 Patient came in today to get her right knee Durolane. She was insistent afterwards that she was told by the checkin staff she could get a series of 2 injections today in one knee. I explained that there is only a series of 1, or a series of 3. Insurance dictates which injection series she gets. She got Durolane last time as well. I went on to explain that she can only get gel every 6 months. She wants to know if next time we can request a series of 3? Thanks!

## 2023-11-02 NOTE — Progress Notes (Signed)
   Procedure Note  Patient: Penny Gibson             Date of Birth: 11/07/1945           MRN: [id number]             Visit Date: 11/02/2023  Procedures: Visit Diagnoses:  1. Primary osteoarthritis of right knee     Large Joint Inj: R knee on 11/02/2023 10:24 AM Indications: pain Details: 22 G needle  Arthrogram: No  Medications: 60 mg Sodium Hyaluronate 60 MG/3ML Outcome: tolerated well, no immediate complications Patient was prepped and draped in the usual sterile fashion.

## 2023-11-02 NOTE — Telephone Encounter (Signed)
 Will call patient's insurance in October, 2025 to see which series is covered under her insurance. Next available gel injection would need to be after 05/04/2024

## 2023-11-30 ENCOUNTER — Ambulatory Visit: Attending: Nurse Practitioner | Admitting: Physical Therapy

## 2023-11-30 ENCOUNTER — Encounter: Payer: Self-pay | Admitting: Physical Therapy

## 2023-11-30 ENCOUNTER — Other Ambulatory Visit: Payer: Self-pay

## 2023-11-30 DIAGNOSIS — R293 Abnormal posture: Secondary | ICD-10-CM | POA: Insufficient documentation

## 2023-11-30 DIAGNOSIS — R279 Unspecified lack of coordination: Secondary | ICD-10-CM | POA: Insufficient documentation

## 2023-11-30 DIAGNOSIS — M6281 Muscle weakness (generalized): Secondary | ICD-10-CM | POA: Diagnosis not present

## 2023-11-30 NOTE — Patient Instructions (Signed)

## 2023-11-30 NOTE — Therapy (Signed)
 OUTPATIENT PHYSICAL THERAPY FEMALE PELVIC EVALUATION   Patient Name: Penny Gibson MRN: [id number] DOB:06-09-1946, 78 y.o., female Today's Date: 11/30/2023  END OF SESSION:  PT End of Session - 11/30/23 1356     Visit Number 1    Number of Visits 10    Authorization Type [employer]    PT Start Time 1015    PT Stop Time 1100    PT Time Calculation (min) 45 min    Activity Tolerance Patient tolerated treatment well    Behavior During Therapy WFL for tasks assessed/performed             Past Medical History:  Diagnosis Date   Anemia    Arthritis    Asthma    Constipation    Diabetes mellitus without complication (HCC)    Dyspnea    Hyperlipidemia    Hypertension    Insomnia    Joint pain    Knee pain    Lactose intolerance    Lower extremity edema    Multiple food allergies    Osteoarthritis    Sinus problem    Past Surgical History:  Procedure Laterality Date   BREAST BIOPSY Left 2010   BREAST LUMPECTOMY Left 2010   BREAST LUMPECTOMY WITH RADIOACTIVE SEED LOCALIZATION Right 09/19/2020   Procedure: RIGHT BREAST LUMPECTOMY WITH RADIOACTIVE SEED LOCALIZATION;  Surgeon: Caralyn Chandler, MD;  Location: [facility];  Service: General;  Laterality: Right;   BREAST SURGERY Right 09/19/2020   EYE SURGERY Bilateral    cataract surgery   REPLACEMENT TOTAL KNEE  03/10/2020   TOTAL KNEE ARTHROPLASTY Left 03/10/2020   Procedure: LEFT TOTAL KNEE ARTHROPLASTY;  Surgeon: Wes Hamman, MD;  Location: [facility];  Service: Orthopedics;  Laterality: Left;   TUBAL LIGATION     Patient Active Problem List   Diagnosis Date Noted   Obesity, morbid (HCC) 09/30/2023   Mixed stress and urge urinary incontinence 09/30/2023   Tinnitus aurium, 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   S/P lumpectomy, right breast 08/04/2022   Drug-induced constipation 01/20/2022   Chronic kidney disease, stage 3a (HCC) 12/25/2021   Primary osteoarthritis of right knee 10/27/2021    Pain due to onychomycosis of toenails of both feet 04/20/2021   Hallux limitus 04/20/2021   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 03/26/2021   History of breast cancer in female 08/27/2020   Type 2 diabetes mellitus with diabetic chronic kidney disease (HCC) 07/18/2020   Vitamin D  deficiency 07/18/2020   Hyperlipidemia associated with type 2 diabetes mellitus (HCC) 07/18/2020   Status post total left knee replacement 03/10/2020   Cardiomegaly 06/27/2018   Iron  deficiency anemia 07/19/2017   Primary osteoarthritis of left knee 12/31/2016   Asthma 05/28/2016   Other seasonal allergic rhinitis 07/26/2014   Atypical squamous cells of undetermined significance (ASCUS) on Papanicolaou smear of cervix 01/31/2014   Essential hypertension 09/19/2013   Generalized osteoarthritis of multiple sites     PCP: [name], Connye Delaine, NP   REFERRING PROVIDER: Kandace Organ, NP   REFERRING DIAG: N39.46 (ICD-10-CM) - Mixed stress and urge urinary incontinence  THERAPY DIAG:  Muscle weakness (generalized)  Unspecified lack of coordination  Abnormal posture  Rationale for Evaluation and Treatment: Rehabilitation  ONSET DATE: last summer  SUBJECTIVE:  SUBJECTIVE STATEMENT: Patient reports to PFPT with mixed incontinence. She leaks urine when she is out and about and has to rush to the bathroom. She has urgency when she wakes up. She leaks a small amount of urine each time she leaks.    Fluid intake: drinks very little water  - 2 bottles/day; 1 cup of coffee per day; lemonade 1x/wk; orange juice sometimes; cherry tart juice for her Gout   PAIN:  Are you having pain? No NPRS scale: 0/10  PRECAUTIONS: None  RED FLAGS: None   WEIGHT BEARING RESTRICTIONS: No  FALLS:  Has patient  fallen in last 6 months? No  OCCUPATION: retired and stays home; cleans up around the house   ACTIVITY LEVEL : cleans around the house, paint   PLOF: Independent  PATIENT GOALS: decrease urinary leakage in general   PERTINENT HISTORY:  breast surgery (right) 2022, TKA left 2021, tubal ligation  Sexual abuse: No  BOWEL MOVEMENT: Pain with bowel movement: No Type of bowel movement:Type (Bristol Stool Scale) 1-7, Frequency every other day, Strain no, and Splinting no Fully empty rectum: Yes:   Leakage: No Pads: No Fiber supplement/laxative Yes - Miralax  and iron  pills   URINATION: Pain with urination: No Fully empty bladder: Yes:   Stream: Strong Urgency: Yes  Frequency: within normal limits - at night she will go 4-5 times, every hour  Leakage: Urge to void, Walking to the bathroom, Coughing, and Sneezing Pads: Yes: underwear with pads - changes 1x/day   INTERCOURSE: not currently sexually active    PREGNANCY: Vaginal deliveries 2 Tearing No Episiotomy No C-section deliveries 0 Currently pregnant No  PROLAPSE: None  OBJECTIVE:  Note: Objective measures were completed at Evaluation unless otherwise noted.  PATIENT SURVEYS:  PFIQ-7: 45  COGNITION: Overall cognitive status: Within functional limits for tasks assessed     SENSATION: Light touch: Appears intact  LUMBAR SPECIAL TESTS:  Single leg stance test: Positive  FUNCTIONAL TESTS:  Squat: dynamic knee valgus present with lumbopelvic stiffness present during transfer  GAIT: Assistive device utilized: None Comments: moderate trendelenburg gait pattern with ambulation  POSTURE: rounded shoulders, forward head, anterior pelvic tilt, and flexed trunk   LUMBARAROM/PROM:  A/PROM A/PROM  eval  Flexion 25% limited  Extension 25% limited  Right lateral flexion 25% limited  Left lateral flexion 25% limited  Right rotation 25% limited  Left rotation 25% limited   (Blank rows = not tested)  LOWER  EXTREMITY ROM: within functional limits   LOWER EXTREMITY MMT: 4/5 bilateral knees and hips grossly   PALPATION:   General: no significant tenderness to palpation of bilateral adductors or hip flexors   Pelvic Alignment: within normal limits   Abdominal: upper chest breathing and abdominal bracing present in seated position and in hooklying. Decreased rib mobility in hooklying                 External Perineal Exam: dryness present with sufficient clitoral hood mobility                              Internal Pelvic Floor: Patient demonstrates generalized weakness in bilateral superficial and deep pelvic floor muscles. She was able to perform a pelvic floor contraction with cueing from PT, but she demonstrates decreased range of motion through the pelvic floor due to her weakness. There is a lack of coordination present between the diaphragm and pelvic floor, so we worked on cueing her exhales with a  pelvic floor contraction. Following 5 contractions, patient was able to get this coordination independently. No pain following today's examination.   Patient confirms identification and approves PT to assess internal pelvic floor and treatment Yes No emotional/communication barriers or cognitive limitation. Patient is motivated to learn. Patient understands and agrees with treatment goals and plan. PT explains patient will be examined in standing, sitting, and lying down to see how their muscles and joints work. When they are ready, they will be asked to remove their underwear so PT can examine their perineum. The patient is also given the option of providing their own chaperone as one is not provided in our facility. The patient also has the right and is explained the right to defer or refuse any part of the evaluation or treatment including the internal exam. With the patient's consent, PT will use one gloved finger to gently assess the muscles of the pelvic floor, seeing how well it contracts and  relaxes and if there is muscle symmetry. After, the patient will get dressed and PT and patient will discuss exam findings and plan of care. PT and patient discuss plan of care, schedule, attendance policy and HEP activities.  PELVIC MMT:   MMT eval  Vaginal 3/5, 10 quick flicks, 3 second hold  Internal Anal Sphincter   External Anal Sphincter   Puborectalis   Diastasis Recti   (Blank rows = not tested)  TONE: Within normal limits   PROLAPSE: None present in hooklying   TODAY'S TREATMENT:                                                                                                                              DATE:   EVAL 11/30/23: Examination completed, findings reviewed, pt educated on POC, HEP, and self care. Pt motivated to participate in PT and agreeable to attempt recommendations.   Neuro re-ed: Hooklying pelvic floor lengthening with inhalation and shortening with exhalation 3x10  Hooklying quick flick pelvic floor contractions + diaphragmatic breathing 3x10  Self care:  Relative anatomy and education surrounding the pelvic floor musculature and diaphragm; Urge drill for urge urinary incontinence that affects her when she wakes in the mornings   PATIENT EDUCATION:  Education details: Relative anatomy and education surrounding the pelvic floor musculature and diaphragm; Urge drill for urge urinary incontinence that affects her when she wakes in the mornings  Person educated: Patient Education method: Explanation, Demonstration, Tactile cues, Verbal cues, and Handouts Education comprehension: verbalized understanding, returned demonstration, verbal cues required, tactile cues required, and needs further education  HOME EXERCISE PROGRAM: Access Code: [email or web address] URL: [email or web address] Date: 11/30/2023 Prepared by: Robbin Chill  Exercises - Supine Pelvic Floor Contraction  - 1 x daily - 7 x weekly - 3 sets - 10 reps - Quick Flick Pelvic Floor Contractions in  Hooklying  - 1 x daily - 7 x weekly - 3 sets - 10 reps  ASSESSMENT:  CLINICAL IMPRESSION: Patient is a 78  y.o. female  who was seen today for physical therapy evaluation and treatment for mixed stress and urge urinary incontinence. Internal vaginal examination performed today and patient demonstrates generalized weakness in bilateral superficial and deep pelvic floor muscles. She was able to perform a pelvic floor contraction with cueing from PT, but she demonstrates decreased range of motion through the pelvic floor due to her weakness. There is a lack of coordination present between the diaphragm and pelvic floor, so we worked on cueing her exhales with a pelvic floor contraction. Following 5 contractions, patient was able to get this coordination independently. No pain following today's examination. Overall, patient tolerated session well and Pt would benefit from additional PT to further address deficits.    OBJECTIVE IMPAIRMENTS: decreased coordination, decreased endurance, decreased mobility, decreased ROM, and decreased strength.   ACTIVITY LIMITATIONS: continence  PARTICIPATION LIMITATIONS: ADLs due to fear of leakage   PERSONAL FACTORS: Age, Past/current experiences, and Time since onset of injury/illness/exacerbation are also affecting patient's functional outcome.   REHAB POTENTIAL: Good  CLINICAL DECISION MAKING: Stable/uncomplicated  EVALUATION COMPLEXITY: Low   GOALS: Goals reviewed with patient? Yes  SHORT TERM GOALS: Target date: 12/28/2023  Pt will be independent with HEP.  Baseline: Goal status: INITIAL  2.  Pt will be independent with the knack, urge suppression technique, and double voiding in order to improve bladder habits and decrease urinary incontinence.   Baseline:  Goal status: INITIAL  3.  Pt will be able to functional actions such as sneeze/cough/laugh without leakage to decrease pad use and prevent dependency with adult diapers. Baseline:  Goal status:  INITIAL  LONG TERM GOALS: Target date: 05/31/2024  Pt will be independent with advanced HEP.  Baseline:  Goal status: INITIAL  2.  Pt to demonstrate improved coordination of pelvic floor and breathing mechanics with 10# squat with appropriate synergistic patterns to decrease pain and leakage at least 75% of the time for improved ability to complete a 30 minute workout with strain at pelvic floor and symptoms.   Baseline:  Goal status: INITIAL  3.  Pt will be able to run/workout for 10 minutes without leakage or discomfort to allow patient to resume baseline physical activity without leaking being a limiting factor.  Baseline:  Goal status: INITIAL  4.  Pt to demonstrate at least 4+/5 bil hip strength for improved pelvic stability and functional squats without leakage.  Baseline:  Goal status: INITIAL PLAN:  PT FREQUENCY: 1-2x/week  PT DURATION: 12 weeks  PLANNED INTERVENTIONS: 97110-Therapeutic exercises, 97530- Therapeutic activity, 97112- Neuromuscular re-education, 97535- Self Care, 54098- Manual therapy, Patient/Family education, Taping, Joint mobilization, Spinal mobilization, Scar mobilization, Cryotherapy, and Moist heat  PLAN FOR NEXT SESSION: continued pelvic floor training in seated position and introduce hip and core strengthening   Marni Sins, PT 11/30/2023, 1:58 PM

## 2023-12-07 ENCOUNTER — Other Ambulatory Visit: Payer: Self-pay | Admitting: Nurse Practitioner

## 2023-12-07 DIAGNOSIS — J4521 Mild intermittent asthma with (acute) exacerbation: Secondary | ICD-10-CM

## 2023-12-10 LAB — LAB REPORT - SCANNED: Microalb Creat Ratio: 30

## 2023-12-30 ENCOUNTER — Ambulatory Visit: Admitting: Nurse Practitioner

## 2024-01-13 ENCOUNTER — Ambulatory Visit: Attending: Nurse Practitioner | Admitting: Physical Therapy

## 2024-01-13 DIAGNOSIS — R293 Abnormal posture: Secondary | ICD-10-CM | POA: Insufficient documentation

## 2024-01-13 DIAGNOSIS — M6281 Muscle weakness (generalized): Secondary | ICD-10-CM | POA: Diagnosis not present

## 2024-01-13 DIAGNOSIS — R279 Unspecified lack of coordination: Secondary | ICD-10-CM | POA: Diagnosis not present

## 2024-01-13 NOTE — Patient Instructions (Signed)
Urge Incontinence  Ideal urination frequency is every 2-4 wakeful hours, which equates to 5-8 times within a 24-hour period.   Urge incontinence is leakage that occurs when the bladder muscle contracts, creating a sudden need to go before getting to the bathroom.   Going too often when your bladder isn't actually full can disrupt the body's automatic signals to store and hold urine longer, which will increase urgency/frequency.  In this case, the bladder "is running the show" and strategies can be learned to retrain this pattern.   One should be able to control the first urge to urinate, at around 150mL.  The bladder can hold up to a "grande latte," or 400mL. To help you gain control, practice the Urge Drill below when urgency strikes.  This drill will help retrain your bladder signals and allow you to store and hold urine longer.  The overall goal is to stretch out your time between voids to reach a more manageable voiding schedule.    Practice your "quick flicks" often throughout the day (each waking hour) even when you don't need feel the urge to go.  This will help strengthen your pelvic floor muscles, making them more effective in controlling leakage.  Urge Drill  When you feel an urge to go, follow these steps to regain control: Stop what you are doing and be still Take one deep breath, directing your air into your abdomen Think an affirming thought, such as "I've got this." Do 5 quick flicks of your pelvic floor Walk with control to the bathroom to void, or delay voiding     THE KNACK  The Knack is a strategy you may use to help to reduce or prevent leakage or passing of urine, gas or feces during an activity that causes downward force on the pelvic floor muscles.    Activities that can cause downward pressure on the pelvic floor muscles include coughing, sneezing, laughing, bending, lifting, and transitioning from different body positions such as from laying down to sitting up and  sitting to standing.  To perform The Knack, consciously squeeze and lift your pelvic floor muscles to perform a strong, well-timed pelvic muscle contraction BEFORE AND DURING these activities above.  As your contraction gets more coordinated and your muscles get stronger, you will become more effective in controlling your experience of incontinence or gas passing during these activities.      

## 2024-01-13 NOTE — Therapy (Signed)
 OUTPATIENT PHYSICAL THERAPY FEMALE PELVIC TREATMENT   Patient Name: Penny Gibson MRN: [id number] DOB:April 15, 1946, 78 y.o., female Today's Date: 01/13/2024  END OF SESSION:  PT End of Session - 01/13/24 0839     Visit Number 2    Number of Visits 10    Authorization Type [employer]    PT Start Time 0800    PT Stop Time 0840    PT Time Calculation (min) 40 min    Activity Tolerance Patient tolerated treatment well    Behavior During Therapy WFL for tasks assessed/performed           Past Medical History:  Diagnosis Date   Anemia    Arthritis    Asthma    Constipation    Diabetes mellitus without complication (HCC)    Dyspnea    Hyperlipidemia    Hypertension    Insomnia    Joint pain    Knee pain    Lactose intolerance    Lower extremity edema    Multiple food allergies    Osteoarthritis    Sinus problem    Past Surgical History:  Procedure Laterality Date   BREAST BIOPSY Left 2010   BREAST LUMPECTOMY Left 2010   BREAST LUMPECTOMY WITH RADIOACTIVE SEED LOCALIZATION Right 09/19/2020   Procedure: RIGHT BREAST LUMPECTOMY WITH RADIOACTIVE SEED LOCALIZATION;  Surgeon: Curvin Deward MOULD, MD;  Location: [facility];  Service: General;  Laterality: Right;   BREAST SURGERY Right 09/19/2020   EYE SURGERY Bilateral    cataract surgery   REPLACEMENT TOTAL KNEE  03/10/2020   TOTAL KNEE ARTHROPLASTY Left 03/10/2020   Procedure: LEFT TOTAL KNEE ARTHROPLASTY;  Surgeon: Jerri Kay HERO, MD;  Location: [facility];  Service: Orthopedics;  Laterality: Left;   TUBAL LIGATION     Patient Active Problem List   Diagnosis Date Noted   Obesity, morbid (HCC) 09/30/2023   Mixed stress and urge urinary incontinence 09/30/2023   Tinnitus aurium, 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   S/P lumpectomy, right breast 08/04/2022   Drug-induced constipation 01/20/2022   Chronic kidney disease, stage 3a (HCC) 12/25/2021   Primary osteoarthritis of right knee 10/27/2021    Pain due to onychomycosis of toenails of both feet 04/20/2021   Hallux limitus 04/20/2021   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 03/26/2021   History of breast cancer in female 08/27/2020   Type 2 diabetes mellitus with diabetic chronic kidney disease (HCC) 07/18/2020   Vitamin D  deficiency 07/18/2020   Hyperlipidemia associated with type 2 diabetes mellitus (HCC) 07/18/2020   Status post total left knee replacement 03/10/2020   Cardiomegaly 06/27/2018   Iron  deficiency anemia 07/19/2017   Primary osteoarthritis of left knee 12/31/2016   Asthma 05/28/2016   Other seasonal allergic rhinitis 07/26/2014   Atypical squamous cells of undetermined significance (ASCUS) on Papanicolaou smear of cervix 01/31/2014   Essential hypertension 09/19/2013   Generalized osteoarthritis of multiple sites     PCP: [name], Roselie Rockford, NP   REFERRING PROVIDER: Katheen Roselie Rockford, NP   REFERRING DIAG: N39.46 (ICD-10-CM) - Mixed stress and urge urinary incontinence  THERAPY DIAG:  Muscle weakness (generalized)  Rationale for Evaluation and Treatment: Rehabilitation  ONSET DATE: last summer  SUBJECTIVE:  SUBJECTIVE STATEMENT: Patient reports that she feels like she is doing a little better with her urinary control. She is trying to drink more water , but she finds herself drinking a lot of water  before bed. She will leak on the way to the bathroom if her urgency is high. No leakage in the daytime. No pain to report.   From eval: Patient reports to PFPT with mixed incontinence. She leaks urine when she is out and about and has to rush to the bathroom. She has urgency when she wakes up. She leaks a small amount of urine each time she leaks.    Fluid intake: drinks very little water  - 2 bottles/day;  1 cup of coffee per day; lemonade 1x/wk; orange juice sometimes; cherry tart juice for her Gout   PAIN:  Are you having pain? No NPRS scale: 0/10  PRECAUTIONS: None  RED FLAGS: None   WEIGHT BEARING RESTRICTIONS: No  FALLS:  Has patient fallen in last 6 months? No  OCCUPATION: retired and stays home; cleans up around the house   ACTIVITY LEVEL : cleans around the house, paint   PLOF: Independent  PATIENT GOALS: decrease urinary leakage in general   PERTINENT HISTORY:  breast surgery (right) 2022, TKA left 2021, tubal ligation  Sexual abuse: No  BOWEL MOVEMENT: Pain with bowel movement: No Type of bowel movement:Type (Bristol Stool Scale) 1-7, Frequency every other day, Strain no, and Splinting no Fully empty rectum: Yes:   Leakage: No Pads: No Fiber supplement/laxative Yes - Miralax  and iron  pills   URINATION: Pain with urination: No Fully empty bladder: Yes:   Stream: Strong Urgency: Yes  Frequency: within normal limits - at night she will go 4-5 times, every hour  Leakage: Urge to void, Walking to the bathroom, Coughing, and Sneezing Pads: Yes: underwear with pads - changes 1x/day   INTERCOURSE: not currently sexually active    PREGNANCY: Vaginal deliveries 2 Tearing No Episiotomy No C-section deliveries 0 Currently pregnant No  PROLAPSE: None  OBJECTIVE:  Note: Objective measures were completed at Evaluation unless otherwise noted.  PATIENT SURVEYS:  PFIQ-7: 45  COGNITION: Overall cognitive status: Within functional limits for tasks assessed     SENSATION: Light touch: Appears intact  LUMBAR SPECIAL TESTS:  Single leg stance test: Positive  FUNCTIONAL TESTS:  Squat: dynamic knee valgus present with lumbopelvic stiffness present during transfer  GAIT: Assistive device utilized: None Comments: moderate trendelenburg gait pattern with ambulation  POSTURE: rounded shoulders, forward head, anterior pelvic tilt, and flexed trunk    LUMBARAROM/PROM:  A/PROM A/PROM  eval  Flexion 25% limited  Extension 25% limited  Right lateral flexion 25% limited  Left lateral flexion 25% limited  Right rotation 25% limited  Left rotation 25% limited   (Blank rows = not tested)  LOWER EXTREMITY ROM: within functional limits   LOWER EXTREMITY MMT: 4/5 bilateral knees and hips grossly   PALPATION:   General: no significant tenderness to palpation of bilateral adductors or hip flexors   Pelvic Alignment: within normal limits   Abdominal: upper chest breathing and abdominal bracing present in seated position and in hooklying. Decreased rib mobility in hooklying                 External Perineal Exam: dryness present with sufficient clitoral hood mobility                              Internal Pelvic Floor: Patient  demonstrates generalized weakness in bilateral superficial and deep pelvic floor muscles. She was able to perform a pelvic floor contraction with cueing from PT, but she demonstrates decreased range of motion through the pelvic floor due to her weakness. There is a lack of coordination present between the diaphragm and pelvic floor, so we worked on cueing her exhales with a pelvic floor contraction. Following 5 contractions, patient was able to get this coordination independently. No pain following today's examination.   Patient confirms identification and approves PT to assess internal pelvic floor and treatment Yes No emotional/communication barriers or cognitive limitation. Patient is motivated to learn. Patient understands and agrees with treatment goals and plan. PT explains patient will be examined in standing, sitting, and lying down to see how their muscles and joints work. When they are ready, they will be asked to remove their underwear so PT can examine their perineum. The patient is also given the option of providing their own chaperone as one is not provided in our facility. The patient also has the right and  is explained the right to defer or refuse any part of the evaluation or treatment including the internal exam. With the patient's consent, PT will use one gloved finger to gently assess the muscles of the pelvic floor, seeing how well it contracts and relaxes and if there is muscle symmetry. After, the patient will get dressed and PT and patient will discuss exam findings and plan of care. PT and patient discuss plan of care, schedule, attendance policy and HEP activities.  PELVIC MMT:   MMT eval  Vaginal 3/5, 10 quick flicks, 3 second hold  Internal Anal Sphincter   External Anal Sphincter   Puborectalis   Diastasis Recti   (Blank rows = not tested)  TONE: Within normal limits   PROLAPSE: None present in hooklying   TODAY'S TREATMENT:                                                                                                                              DATE:   EVAL 11/30/23: Examination completed, findings reviewed, pt educated on POC, HEP, and self care. Pt motivated to participate in PT and agreeable to attempt recommendations.   Neuro re-ed: Hooklying pelvic floor lengthening with inhalation and shortening with exhalation 3x10  Hooklying quick flick pelvic floor contractions + diaphragmatic breathing 3x10  Self care:  Relative anatomy and education surrounding the pelvic floor musculature and diaphragm; Urge drill for urge urinary incontinence that affects her when she wakes in the mornings   01/13/24: Neuro re-ed: seated pelvic floor lengthening with inhalation and shortening with exhalation 3x10  seated quick flick pelvic floor contractions + diaphragmatic breathing 3x10  Therapeutic exercise: Sit to stand + adductor ball squeeze + diaphragmatic breathing 2x10  Bridge + adductor ball squeeze + diaphragmatic breathing 2x10  Sidelying clamshell + reverse clamshell + diaphragmatic breathing 2x10 each  Lower trunk rotations + diaphragmatic breathing 2x20 Self care:  Relative  anatomy and education surrounding the pelvic floor musculature and diaphragm; Urge drill for urge urinary incontinence that affects her when she wakes in the mornings, knack technique for stress urinary incontinence   PATIENT EDUCATION:  Education details: Relative anatomy and education surrounding the pelvic floor musculature and diaphragm; Urge drill for urge urinary incontinence that affects her when she wakes in the mornings  Person educated: Patient Education method: Explanation, Demonstration, Tactile cues, Verbal cues, and Handouts Education comprehension: verbalized understanding, returned demonstration, verbal cues required, tactile cues required, and needs further education  HOME EXERCISE PROGRAM: Access Code: [email or web address] URL: [email or web address] Date: 01/13/2024 Prepared by: Celena Domino  Exercises - Seated Pelvic Floor Contraction  - 1 x daily - 7 x weekly - 2 sets - 10 reps - Seated Quick Flick Pelvic Floor Contractions  - 1 x daily - 7 x weekly - 2 sets - 10 reps - Sit to Stand with Mercer Between Knees  - 1 x daily - 7 x weekly - 2 sets - 10 reps - Supine Bridge with Mini Swiss Ball Between Knees  - 1 x daily - 7 x weekly - 2 sets - 10 reps - Clamshell  - 1 x daily - 7 x weekly - 2 sets - 10 reps - Sidelying Reverse Clamshell  - 1 x daily - 7 x weekly - 2 sets - 10 reps - Supine Lower Trunk Rotation  - 1 x daily - 7 x weekly - 2 sets - 10 reps  ASSESSMENT:  CLINICAL IMPRESSION: Patient is a 78 y.o. female  who was seen today for physical therapy treatment for mixed stress and urge urinary incontinence. She reports no daytime urinary incontinence since eval, which she is pleased with. She is still leaking when she wakes in the morning and has a high sense of urgency, or when she coughs/sneezes with a full bladder. We discussed the urge drill and knack technique for managing mixed incontinence. Gluteal strengthening introduced today and patient required max cueing  from PT for diaphragmatic breathing coordination. Overall, patient tolerated session well and Pt would benefit from additional PT to further address deficits.    OBJECTIVE IMPAIRMENTS: decreased coordination, decreased endurance, decreased mobility, decreased ROM, and decreased strength.   ACTIVITY LIMITATIONS: continence  PARTICIPATION LIMITATIONS: ADLs due to fear of leakage   PERSONAL FACTORS: Age, Past/current experiences, and Time since onset of injury/illness/exacerbation are also affecting patient's functional outcome.   REHAB POTENTIAL: Good  CLINICAL DECISION MAKING: Stable/uncomplicated  EVALUATION COMPLEXITY: Low   GOALS: Goals reviewed with patient? Yes  SHORT TERM GOALS: Target date: 12/28/2023  Pt will be independent with HEP.  Baseline: Goal status: INITIAL  2.  Pt will be independent with the knack, urge suppression technique, and double voiding in order to improve bladder habits and decrease urinary incontinence.   Baseline:  Goal status: INITIAL  3.  Pt will be able to functional actions such as sneeze/cough/laugh without leakage to decrease pad use and prevent dependency with adult diapers. Baseline:  Goal status: INITIAL  LONG TERM GOALS: Target date: 05/31/2024  Pt will be independent with advanced HEP.  Baseline:  Goal status: INITIAL  2.  Pt to demonstrate improved coordination of pelvic floor and breathing mechanics with 10# squat with appropriate synergistic patterns to decrease pain and leakage at least 75% of the time for improved ability to complete a 30 minute workout with strain at pelvic floor and symptoms.   Baseline:  Goal status: INITIAL  3.  Pt  will be able to run/workout for 10 minutes without leakage or discomfort to allow patient to resume baseline physical activity without leaking being a limiting factor.  Baseline:  Goal status: INITIAL  4.  Pt to demonstrate at least 4+/5 bil hip strength for improved pelvic stability and  functional squats without leakage.  Baseline:  Goal status: INITIAL PLAN:  PT FREQUENCY: 1-2x/week  PT DURATION: 12 weeks  PLANNED INTERVENTIONS: 97110-Therapeutic exercises, 97530- Therapeutic activity, 97112- Neuromuscular re-education, 97535- Self Care, [id number]- Manual therapy, Patient/Family education, Taping, Joint mobilization, Spinal mobilization, Scar mobilization, Cryotherapy, and Moist heat  PLAN FOR NEXT SESSION: continued pelvic floor training in seated position and introduce hip and core strengthening   Celena JAYSON Domino, PT 01/13/2024, 8:39 AM

## 2024-01-19 ENCOUNTER — Ambulatory Visit: Payer: Self-pay | Admitting: Physical Therapy

## 2024-01-19 DIAGNOSIS — R279 Unspecified lack of coordination: Secondary | ICD-10-CM

## 2024-01-19 DIAGNOSIS — M6281 Muscle weakness (generalized): Secondary | ICD-10-CM

## 2024-01-19 DIAGNOSIS — R293 Abnormal posture: Secondary | ICD-10-CM | POA: Diagnosis not present

## 2024-01-19 NOTE — Therapy (Signed)
 OUTPATIENT PHYSICAL THERAPY FEMALE PELVIC TREATMENT   Patient Name: Penny Gibson MRN: [id number] DOB:09-Oct-1945, 78 y.o., female Today's Date: 01/19/2024  END OF SESSION:  PT End of Session - 01/19/24 1116     Visit Number 3    Number of Visits 10    Authorization Type [employer]    PT Start Time 1100    PT Stop Time 1138    PT Time Calculation (min) 38 min    Activity Tolerance Patient tolerated treatment well    Behavior During Therapy WFL for tasks assessed/performed            Past Medical History:  Diagnosis Date   Anemia    Arthritis    Asthma    Constipation    Diabetes mellitus without complication (HCC)    Dyspnea    Hyperlipidemia    Hypertension    Insomnia    Joint pain    Knee pain    Lactose intolerance    Lower extremity edema    Multiple food allergies    Osteoarthritis    Sinus problem    Past Surgical History:  Procedure Laterality Date   BREAST BIOPSY Left 2010   BREAST LUMPECTOMY Left 2010   BREAST LUMPECTOMY WITH RADIOACTIVE SEED LOCALIZATION Right 09/19/2020   Procedure: RIGHT BREAST LUMPECTOMY WITH RADIOACTIVE SEED LOCALIZATION;  Surgeon: Curvin Deward MOULD, MD;  Location: [facility];  Service: General;  Laterality: Right;   BREAST SURGERY Right 09/19/2020   EYE SURGERY Bilateral    cataract surgery   REPLACEMENT TOTAL KNEE  03/10/2020   TOTAL KNEE ARTHROPLASTY Left 03/10/2020   Procedure: LEFT TOTAL KNEE ARTHROPLASTY;  Surgeon: Jerri Kay HERO, MD;  Location: [facility];  Service: Orthopedics;  Laterality: Left;   TUBAL LIGATION     Patient Active Problem List   Diagnosis Date Noted   Obesity, morbid (HCC) 09/30/2023   Mixed stress and urge urinary incontinence 09/30/2023   Tinnitus aurium, 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   S/P lumpectomy, right breast 08/04/2022   Drug-induced constipation 01/20/2022   Chronic kidney disease, stage 3a (HCC) 12/25/2021   Primary osteoarthritis of right knee 10/27/2021    Pain due to onychomycosis of toenails of both feet 04/20/2021   Hallux limitus 04/20/2021   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 03/26/2021   History of breast cancer in female 08/27/2020   Type 2 diabetes mellitus with diabetic chronic kidney disease (HCC) 07/18/2020   Vitamin D  deficiency 07/18/2020   Hyperlipidemia associated with type 2 diabetes mellitus (HCC) 07/18/2020   Status post total left knee replacement 03/10/2020   Cardiomegaly 06/27/2018   Iron  deficiency anemia 07/19/2017   Primary osteoarthritis of left knee 12/31/2016   Asthma 05/28/2016   Other seasonal allergic rhinitis 07/26/2014   Atypical squamous cells of undetermined significance (ASCUS) on Papanicolaou smear of cervix 01/31/2014   Essential hypertension 09/19/2013   Generalized osteoarthritis of multiple sites     PCP: [name], Roselie Rockford, NP   REFERRING PROVIDER: Katheen Roselie Rockford, NP   REFERRING DIAG: N39.46 (ICD-10-CM) - Mixed stress and urge urinary incontinence  THERAPY DIAG:  Muscle weakness (generalized)  Unspecified lack of coordination  Abnormal posture  Rationale for Evaluation and Treatment: Rehabilitation  ONSET DATE: last summer  SUBJECTIVE:  SUBJECTIVE STATEMENT: Patient reports that her bladder behaves more when she doesn't drink water  after a certain time in the evenings. No daytime leakage. No bowel concerns to report. 0/10 pain to report today.   From eval: Patient reports to PFPT with mixed incontinence. She leaks urine when she is out and about and has to rush to the bathroom. She has urgency when she wakes up. She leaks a small amount of urine each time she leaks.    Fluid intake: drinks very little water  - 2 bottles/day; 1 cup of coffee per day; lemonade 1x/wk; orange  juice sometimes; cherry tart juice for her Gout   PAIN:  Are you having pain? No NPRS scale: 0/10  PRECAUTIONS: None  RED FLAGS: None   WEIGHT BEARING RESTRICTIONS: No  FALLS:  Has patient fallen in last 6 months? No  OCCUPATION: retired and stays home; cleans up around the house   ACTIVITY LEVEL : cleans around the house, paint   PLOF: Independent  PATIENT GOALS: decrease urinary leakage in general   PERTINENT HISTORY:  breast surgery (right) 2022, TKA left 2021, tubal ligation  Sexual abuse: No  BOWEL MOVEMENT: Pain with bowel movement: No Type of bowel movement:Type (Bristol Stool Scale) 1-7, Frequency every other day, Strain no, and Splinting no Fully empty rectum: Yes:   Leakage: No Pads: No Fiber supplement/laxative Yes - Miralax  and iron  pills   URINATION: Pain with urination: No Fully empty bladder: Yes:   Stream: Strong Urgency: Yes  Frequency: within normal limits - at night she will go 4-5 times, every hour  Leakage: Urge to void, Walking to the bathroom, Coughing, and Sneezing Pads: Yes: underwear with pads - changes 1x/day   INTERCOURSE: not currently sexually active    PREGNANCY: Vaginal deliveries 2 Tearing No Episiotomy No C-section deliveries 0 Currently pregnant No  PROLAPSE: None  OBJECTIVE:  Note: Objective measures were completed at Evaluation unless otherwise noted.  PATIENT SURVEYS:  PFIQ-7: 45  COGNITION: Overall cognitive status: Within functional limits for tasks assessed     SENSATION: Light touch: Appears intact  LUMBAR SPECIAL TESTS:  Single leg stance test: Positive  FUNCTIONAL TESTS:  Squat: dynamic knee valgus present with lumbopelvic stiffness present during transfer  GAIT: Assistive device utilized: None Comments: moderate trendelenburg gait pattern with ambulation  POSTURE: rounded shoulders, forward head, anterior pelvic tilt, and flexed trunk   LUMBARAROM/PROM:  A/PROM A/PROM  eval  Flexion 25%  limited  Extension 25% limited  Right lateral flexion 25% limited  Left lateral flexion 25% limited  Right rotation 25% limited  Left rotation 25% limited   (Blank rows = not tested)  LOWER EXTREMITY ROM: within functional limits   LOWER EXTREMITY MMT: 4/5 bilateral knees and hips grossly   PALPATION:   General: no significant tenderness to palpation of bilateral adductors or hip flexors   Pelvic Alignment: within normal limits   Abdominal: upper chest breathing and abdominal bracing present in seated position and in hooklying. Decreased rib mobility in hooklying                 External Perineal Exam: dryness present with sufficient clitoral hood mobility                              Internal Pelvic Floor: Patient demonstrates generalized weakness in bilateral superficial and deep pelvic floor muscles. She was able to perform a pelvic floor contraction with cueing from PT, but  she demonstrates decreased range of motion through the pelvic floor due to her weakness. There is a lack of coordination present between the diaphragm and pelvic floor, so we worked on cueing her exhales with a pelvic floor contraction. Following 5 contractions, patient was able to get this coordination independently. No pain following today's examination.   Patient confirms identification and approves PT to assess internal pelvic floor and treatment Yes No emotional/communication barriers or cognitive limitation. Patient is motivated to learn. Patient understands and agrees with treatment goals and plan. PT explains patient will be examined in standing, sitting, and lying down to see how their muscles and joints work. When they are ready, they will be asked to remove their underwear so PT can examine their perineum. The patient is also given the option of providing their own chaperone as one is not provided in our facility. The patient also has the right and is explained the right to defer or refuse any part of the  evaluation or treatment including the internal exam. With the patient's consent, PT will use one gloved finger to gently assess the muscles of the pelvic floor, seeing how well it contracts and relaxes and if there is muscle symmetry. After, the patient will get dressed and PT and patient will discuss exam findings and plan of care. PT and patient discuss plan of care, schedule, attendance policy and HEP activities.  PELVIC MMT:   MMT eval  Vaginal 3/5, 10 quick flicks, 3 second hold  Internal Anal Sphincter   External Anal Sphincter   Puborectalis   Diastasis Recti   (Blank rows = not tested)  TONE: Within normal limits   PROLAPSE: None present in hooklying   TODAY'S TREATMENT:                                                                                                                              DATE:   EVAL 11/30/23: Examination completed, findings reviewed, pt educated on POC, HEP, and self care. Pt motivated to participate in PT and agreeable to attempt recommendations.   Neuro re-ed: Hooklying pelvic floor lengthening with inhalation and shortening with exhalation 3x10  Hooklying quick flick pelvic floor contractions + diaphragmatic breathing 3x10  Self care:  Relative anatomy and education surrounding the pelvic floor musculature and diaphragm; Urge drill for urge urinary incontinence that affects her when she wakes in the mornings   01/13/24: Neuro re-ed: seated pelvic floor lengthening with inhalation and shortening with exhalation 3x10  seated quick flick pelvic floor contractions + diaphragmatic breathing 3x10  Therapeutic exercise: Sit to stand + adductor ball squeeze + diaphragmatic breathing 2x10  Bridge + adductor ball squeeze + diaphragmatic breathing 2x10  Sidelying clamshell + reverse clamshell + diaphragmatic breathing 2x10 each  Lower trunk rotations + diaphragmatic breathing 2x20 Self care:  Relative anatomy and education surrounding the pelvic floor  musculature and diaphragm; Urge drill for urge urinary incontinence that affects her when she wakes  in the mornings, knack technique for stress urinary incontinence   01/19/24: Neuro re-ed: seated pelvic floor lengthening with inhalation and shortening with exhalation 3x10  seated quick flick pelvic floor contractions + diaphragmatic breathing 3x10  Sit to stand + adductor ball squeeze + diaphragmatic breathing 2x10  Bridge + adductor ball squeeze + diaphragmatic breathing 2x10  Sidelying clamshell + reverse clamshell + diaphragmatic breathing 2x10 each  Therapeutic exercise: Lower trunk rotations + diaphragmatic breathing 2x20 Seated adductor ball squeeze + internal rotation (RTB) + diaphragmatic breathing 2x10 each side  Seated hamstring stretch + diaphragmatic breathing 2x10 NuStep level 1, 5 minutes - PT present to discuss current status   Self care:  Relative anatomy and education surrounding the pelvic floor musculature and diaphragm; Urge drill for urge urinary incontinence that affects her when she wakes in the mornings, knack technique for stress urinary incontinence   PATIENT EDUCATION:  Education details: Relative anatomy and education surrounding the pelvic floor musculature and diaphragm; Urge drill for urge urinary incontinence that affects her when she wakes in the mornings  Person educated: Patient Education method: Explanation, Demonstration, Tactile cues, Verbal cues, and Handouts Education comprehension: verbalized understanding, returned demonstration, verbal cues required, tactile cues required, and needs further education  HOME EXERCISE PROGRAM: Access Code: [email or web address] URL: [email or web address] Date: 01/19/2024 Prepared by: Celena Domino  Exercises - Seated Pelvic Floor Contraction  - 1 x daily - 7 x weekly - 2 sets - 10 reps - Seated Quick Flick Pelvic Floor Contractions  - 1 x daily - 7 x weekly - 2 sets - 10 reps - Sit to Stand with Mercer Between  Knees  - 1 x daily - 7 x weekly - 2 sets - 10 reps - Supine Bridge with Mini Swiss Ball Between Knees  - 1 x daily - 7 x weekly - 2 sets - 10 reps - Seated Hip Internal Rotation with Ball and Resistance  - 1 x daily - 7 x weekly - 2 sets - 10 reps - Supine Lower Trunk Rotation  - 1 x daily - 7 x weekly - 2 sets - 10 reps - Seated Hamstring Stretch  - 1 x daily - 7 x weekly - 2 sets - hold  ASSESSMENT:  CLINICAL IMPRESSION: Patient is a 78 y.o. female  who was seen today for physical therapy treatment for mixed stress and urge urinary incontinence. She reports no daytime urinary incontinence since eval, which she is pleased with. She has not had any leakage during the daytime recently and she is no longer waking more than 1x/night since reducing fluid intake before sleep time. We reviewed the urge drill and knack technique for managing mixed incontinence. Gluteal strengthening continued today and patient required max cueing from PT for diaphragmatic breathing coordination. Overall, patient tolerated session well and Pt would benefit from additional PT to further address deficits.    OBJECTIVE IMPAIRMENTS: decreased coordination, decreased endurance, decreased mobility, decreased ROM, and decreased strength.   ACTIVITY LIMITATIONS: continence  PARTICIPATION LIMITATIONS: ADLs due to fear of leakage   PERSONAL FACTORS: Age, Past/current experiences, and Time since onset of injury/illness/exacerbation are also affecting patient's functional outcome.   REHAB POTENTIAL: Good  CLINICAL DECISION MAKING: Stable/uncomplicated  EVALUATION COMPLEXITY: Low   GOALS: Goals reviewed with patient? Yes  SHORT TERM GOALS: Target date: 12/28/2023  Pt will be independent with HEP.  Baseline: Goal status: INITIAL  2.  Pt will be independent with the knack, urge suppression technique, and double voiding in order  to improve bladder habits and decrease urinary incontinence.   Baseline:  Goal status:  INITIAL  3.  Pt will be able to functional actions such as sneeze/cough/laugh without leakage to decrease pad use and prevent dependency with adult diapers. Baseline:  Goal status: INITIAL  LONG TERM GOALS: Target date: 05/31/2024  Pt will be independent with advanced HEP.  Baseline:  Goal status: INITIAL  2.  Pt to demonstrate improved coordination of pelvic floor and breathing mechanics with 10# squat with appropriate synergistic patterns to decrease pain and leakage at least 75% of the time for improved ability to complete a 30 minute workout with strain at pelvic floor and symptoms.   Baseline:  Goal status: INITIAL  3.  Pt will be able to run/workout for 10 minutes without leakage or discomfort to allow patient to resume baseline physical activity without leaking being a limiting factor.  Baseline:  Goal status: INITIAL  4.  Pt to demonstrate at least 4+/5 bil hip strength for improved pelvic stability and functional squats without leakage.  Baseline:  Goal status: INITIAL PLAN:  PT FREQUENCY: 1-2x/week  PT DURATION: 12 weeks  PLANNED INTERVENTIONS: 97110-Therapeutic exercises, 97530- Therapeutic activity, 97112- Neuromuscular re-education, 97535- Self Care, [id number]- Manual therapy, Patient/Family education, Taping, Joint mobilization, Spinal mobilization, Scar mobilization, Cryotherapy, and Moist heat  PLAN FOR NEXT SESSION: continued pelvic floor training in seated position and introduce hip and core strengthening   Celena JAYSON Domino, PT 01/19/2024, 11:39 AM

## 2024-01-25 ENCOUNTER — Ambulatory Visit: Attending: Nurse Practitioner | Admitting: Physical Therapy

## 2024-01-25 DIAGNOSIS — M6281 Muscle weakness (generalized): Secondary | ICD-10-CM | POA: Insufficient documentation

## 2024-01-25 DIAGNOSIS — R279 Unspecified lack of coordination: Secondary | ICD-10-CM | POA: Insufficient documentation

## 2024-01-25 NOTE — Therapy (Signed)
 OUTPATIENT PHYSICAL THERAPY FEMALE PELVIC TREATMENT   Patient Name: Penny Gibson MRN: [id number] DOB:[birth year], 78 y.o., female Today's Date: 01/25/2024  END OF SESSION:  PT End of Session - 01/25/24 1253     Visit Number 4    Number of Visits 10    Authorization Type [employer]    PT Start Time 1230    PT Stop Time 1305    PT Time Calculation (min) 35 min    Activity Tolerance Patient tolerated treatment well    Behavior During Therapy WFL for tasks assessed/performed             Past Medical History:  Diagnosis Date   Anemia    Arthritis    Asthma    Constipation    Diabetes mellitus without complication (HCC)    Dyspnea    Hyperlipidemia    Hypertension    Insomnia    Joint pain    Knee pain    Lactose intolerance    Lower extremity edema    Multiple food allergies    Osteoarthritis    Sinus problem    Past Surgical History:  Procedure Laterality Date   BREAST BIOPSY Left 2010   BREAST LUMPECTOMY Left 2010   BREAST LUMPECTOMY WITH RADIOACTIVE SEED LOCALIZATION Right 09/19/2020   Procedure: RIGHT BREAST LUMPECTOMY WITH RADIOACTIVE SEED LOCALIZATION;  Surgeon: Curvin Deward MOULD, MD;  Location: [facility];  Service: General;  Laterality: Right;   BREAST SURGERY Right 09/19/2020   EYE SURGERY Bilateral    cataract surgery   REPLACEMENT TOTAL KNEE  03/10/2020   TOTAL KNEE ARTHROPLASTY Left 03/10/2020   Procedure: LEFT TOTAL KNEE ARTHROPLASTY;  Surgeon: Jerri Kay HERO, MD;  Location: [facility];  Service: Orthopedics;  Laterality: Left;   TUBAL LIGATION     Patient Active Problem List   Diagnosis Date Noted   Obesity, morbid (HCC) 09/30/2023   Mixed stress and urge urinary incontinence 09/30/2023   Tinnitus aurium, 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   S/P lumpectomy, right breast 08/04/2022   Drug-induced constipation 01/20/2022   Chronic kidney disease, stage 3a (HCC) 12/25/2021   Primary osteoarthritis of right knee 10/27/2021    Pain due to onychomycosis of toenails of both feet 04/20/2021   Hallux limitus 04/20/2021   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 03/26/2021   History of breast cancer in female 08/27/2020   Type 2 diabetes mellitus with diabetic chronic kidney disease (HCC) 07/18/2020   Vitamin D  deficiency 07/18/2020   Hyperlipidemia associated with type 2 diabetes mellitus (HCC) 07/18/2020   Status post total left knee replacement 03/10/2020   Cardiomegaly 06/27/2018   Iron  deficiency anemia 07/19/2017   Primary osteoarthritis of left knee 12/31/2016   Asthma 05/28/2016   Other seasonal allergic rhinitis 07/26/2014   Atypical squamous cells of undetermined significance (ASCUS) on Papanicolaou smear of cervix 01/31/2014   Essential hypertension 09/19/2013   Generalized osteoarthritis of multiple sites     PCP: [name], Roselie Rockford, NP   REFERRING PROVIDER: Katheen Roselie Rockford, NP   REFERRING DIAG: N39.46 (ICD-10-CM) - Mixed stress and urge urinary incontinence  THERAPY DIAG:  Unspecified lack of coordination  Muscle weakness (generalized)  Rationale for Evaluation and Treatment: Rehabilitation  ONSET DATE: last summer  SUBJECTIVE:  SUBJECTIVE STATEMENT: Patient reports that she hasn't been feeling well this week. She went on a cruise this weekend and she did not have any accidents until she got home. She was able to get home, walk inside, get in the door, and as soon as she got through the door she leaked all over herself. She did not try the urge drill this time. No bowel related concerns. 0/10 pain to report today.   From eval: Patient reports to PFPT with mixed incontinence. She leaks urine when she is out and about and has to rush to the bathroom. She has urgency when she wakes up.  She leaks a small amount of urine each time she leaks.    Fluid intake: drinks very little water  - 2 bottles/day; 1 cup of coffee per day; lemonade 1x/wk; orange juice sometimes; cherry tart juice for her Gout   PAIN:  Are you having pain? No NPRS scale: 0/10  PRECAUTIONS: None  RED FLAGS: None   WEIGHT BEARING RESTRICTIONS: No  FALLS:  Has patient fallen in last 6 months? No  OCCUPATION: retired and stays home; cleans up around the house   ACTIVITY LEVEL : cleans around the house, paint   PLOF: Independent  PATIENT GOALS: decrease urinary leakage in general   PERTINENT HISTORY:  breast surgery (right) 2022, TKA left 2021, tubal ligation  Sexual abuse: No  BOWEL MOVEMENT: Pain with bowel movement: No Type of bowel movement:Type (Bristol Stool Scale) 1-7, Frequency every other day, Strain no, and Splinting no Fully empty rectum: Yes:   Leakage: No Pads: No Fiber supplement/laxative Yes - Miralax  and iron  pills   URINATION: Pain with urination: No Fully empty bladder: Yes:   Stream: Strong Urgency: Yes  Frequency: within normal limits - at night she will go 4-5 times, every hour  Leakage: Urge to void, Walking to the bathroom, Coughing, and Sneezing Pads: Yes: underwear with pads - changes 1x/day   INTERCOURSE: not currently sexually active    PREGNANCY: Vaginal deliveries 2 Tearing No Episiotomy No C-section deliveries 0 Currently pregnant No  PROLAPSE: None  OBJECTIVE:  Note: Objective measures were completed at Evaluation unless otherwise noted.  PATIENT SURVEYS:  PFIQ-7: 45  COGNITION: Overall cognitive status: Within functional limits for tasks assessed     SENSATION: Light touch: Appears intact  LUMBAR SPECIAL TESTS:  Single leg stance test: Positive  FUNCTIONAL TESTS:  Squat: dynamic knee valgus present with lumbopelvic stiffness present during transfer  GAIT: Assistive device utilized: None Comments: moderate trendelenburg gait  pattern with ambulation  POSTURE: rounded shoulders, forward head, anterior pelvic tilt, and flexed trunk   LUMBARAROM/PROM:  A/PROM A/PROM  eval  Flexion 25% limited  Extension 25% limited  Right lateral flexion 25% limited  Left lateral flexion 25% limited  Right rotation 25% limited  Left rotation 25% limited   (Blank rows = not tested)  LOWER EXTREMITY ROM: within functional limits   LOWER EXTREMITY MMT: 4/5 bilateral knees and hips grossly   PALPATION:   General: no significant tenderness to palpation of bilateral adductors or hip flexors   Pelvic Alignment: within normal limits   Abdominal: upper chest breathing and abdominal bracing present in seated position and in hooklying. Decreased rib mobility in hooklying                 External Perineal Exam: dryness present with sufficient clitoral hood mobility  Internal Pelvic Floor: Patient demonstrates generalized weakness in bilateral superficial and deep pelvic floor muscles. She was able to perform a pelvic floor contraction with cueing from PT, but she demonstrates decreased range of motion through the pelvic floor due to her weakness. There is a lack of coordination present between the diaphragm and pelvic floor, so we worked on cueing her exhales with a pelvic floor contraction. Following 5 contractions, patient was able to get this coordination independently. No pain following today's examination.   Patient confirms identification and approves PT to assess internal pelvic floor and treatment Yes No emotional/communication barriers or cognitive limitation. Patient is motivated to learn. Patient understands and agrees with treatment goals and plan. PT explains patient will be examined in standing, sitting, and lying down to see how their muscles and joints work. When they are ready, they will be asked to remove their underwear so PT can examine their perineum. The patient is also given the option  of providing their own chaperone as one is not provided in our facility. The patient also has the right and is explained the right to defer or refuse any part of the evaluation or treatment including the internal exam. With the patient's consent, PT will use one gloved finger to gently assess the muscles of the pelvic floor, seeing how well it contracts and relaxes and if there is muscle symmetry. After, the patient will get dressed and PT and patient will discuss exam findings and plan of care. PT and patient discuss plan of care, schedule, attendance policy and HEP activities.  PELVIC MMT:   MMT eval  Vaginal 3/5, 10 quick flicks, 3 second hold  Internal Anal Sphincter   External Anal Sphincter   Puborectalis   Diastasis Recti   (Blank rows = not tested)  TONE: Within normal limits   PROLAPSE: None present in hooklying   TODAY'S TREATMENT:                                                                                                                              DATE:   01/13/24: Neuro re-ed: seated pelvic floor lengthening with inhalation and shortening with exhalation 3x10  seated quick flick pelvic floor contractions + diaphragmatic breathing 3x10  Therapeutic exercise: Sit to stand + adductor ball squeeze + diaphragmatic breathing 2x10  Bridge + adductor ball squeeze + diaphragmatic breathing 2x10  Sidelying clamshell + reverse clamshell + diaphragmatic breathing 2x10 each  Lower trunk rotations + diaphragmatic breathing 2x20 Self care:  Relative anatomy and education surrounding the pelvic floor musculature and diaphragm; Urge drill for urge urinary incontinence that affects her when she wakes in the mornings, knack technique for stress urinary incontinence   01/19/24: Neuro re-ed: seated pelvic floor lengthening with inhalation and shortening with exhalation 3x10  seated quick flick pelvic floor contractions + diaphragmatic breathing 3x10  Sit to stand + adductor ball  squeeze + diaphragmatic breathing 2x10  Bridge + adductor ball squeeze +  diaphragmatic breathing 2x10  Sidelying clamshell + reverse clamshell + diaphragmatic breathing 2x10 each  Therapeutic exercise: Lower trunk rotations + diaphragmatic breathing 2x20 Seated adductor ball squeeze + internal rotation (RTB) + diaphragmatic breathing 2x10 each side  Seated hamstring stretch + diaphragmatic breathing 2x10 NuStep level 1, 5 minutes - PT present to discuss current status   Self care:  Relative anatomy and education surrounding the pelvic floor musculature and diaphragm; Urge drill for urge urinary incontinence that affects her when she wakes in the mornings, knack technique for stress urinary incontinence   01/25/24: Patient had to leave session early today. Neuro re-ed: seated pelvic floor lengthening with inhalation and shortening with exhalation x10  seated quick flick pelvic floor contractions + diaphragmatic breathing x10  Sit to stand + adductor ball squeeze + diaphragmatic breathing 2x10  Bridge + adductor ball squeeze + diaphragmatic breathing 2x10  Sidelying clamshell + reverse clamshell + diaphragmatic breathing 2x10 each  Therapeutic exercise: Lower trunk rotations + diaphragmatic breathing 2x20 Seated adductor ball squeeze + internal rotation (RTB) + diaphragmatic breathing 2x10 each side  Seated hamstring stretch + diaphragmatic breathing 2x10 Self care:  Relative anatomy and education surrounding the pelvic floor musculature and diaphragm; Urge drill for urge urinary incontinence that affects her when she wakes in the mornings, knack technique for stress urinary incontinence  Review urge drill for when patient gets home and urgency is high trying to get into the house   PATIENT EDUCATION:  Education details: Relative anatomy and education surrounding the pelvic floor musculature and diaphragm; Urge drill for urge urinary incontinence that affects her when she wakes in the mornings   Person educated: Patient Education method: Explanation, Demonstration, Tactile cues, Verbal cues, and Handouts Education comprehension: verbalized understanding, returned demonstration, verbal cues required, tactile cues required, and needs further education  HOME EXERCISE PROGRAM: Access Code: [email or web address] URL: [email or web address] Date: 01/19/2024 Prepared by: Celena Domino  Exercises - Seated Pelvic Floor Contraction  - 1 x daily - 7 x weekly - 2 sets - 10 reps - Seated Quick Flick Pelvic Floor Contractions  - 1 x daily - 7 x weekly - 2 sets - 10 reps - Sit to Stand with Mercer Between Knees  - 1 x daily - 7 x weekly - 2 sets - 10 reps - Supine Bridge with Mini Swiss Ball Between Knees  - 1 x daily - 7 x weekly - 2 sets - 10 reps - Seated Hip Internal Rotation with Ball and Resistance  - 1 x daily - 7 x weekly - 2 sets - 10 reps - Supine Lower Trunk Rotation  - 1 x daily - 7 x weekly - 2 sets - 10 reps - Seated Hamstring Stretch  - 1 x daily - 7 x weekly - 2 sets - hold  ASSESSMENT:  CLINICAL IMPRESSION: Patient is a 78 y.o. female  who was seen today for physical therapy treatment for mixed stress and urge urinary incontinence. Patient has not had any daytime incontinence other than one episode on Sunday when she was trying to get into the house with a high sense of urinary urgency. Urge drill reviewed and patient reports thorough understanding of technique. HEP reviewed and patient had to end session early as her transportation was in need of leaving. Overall, patient tolerated session well and Pt would benefit from additional PT to further address deficits.    OBJECTIVE IMPAIRMENTS: decreased coordination, decreased endurance, decreased mobility, decreased ROM, and decreased strength.   ACTIVITY LIMITATIONS:  continence  PARTICIPATION LIMITATIONS: ADLs due to fear of leakage   PERSONAL FACTORS: Age, Past/current experiences, and Time since onset of  injury/illness/exacerbation are also affecting patient's functional outcome.   REHAB POTENTIAL: Good  CLINICAL DECISION MAKING: Stable/uncomplicated  EVALUATION COMPLEXITY: Low   GOALS: Goals reviewed with patient? Yes  SHORT TERM GOALS: Target date: 12/28/2023  Pt will be independent with HEP.  Baseline: Goal status: INITIAL  2.  Pt will be independent with the knack, urge suppression technique, and double voiding in order to improve bladder habits and decrease urinary incontinence.   Baseline:  Goal status: INITIAL  3.  Pt will be able to functional actions such as sneeze/cough/laugh without leakage to decrease pad use and prevent dependency with adult diapers. Baseline:  Goal status: INITIAL  LONG TERM GOALS: Target date: 05/31/2024  Pt will be independent with advanced HEP.  Baseline:  Goal status: INITIAL  2.  Pt to demonstrate improved coordination of pelvic floor and breathing mechanics with 10# squat with appropriate synergistic patterns to decrease pain and leakage at least 75% of the time for improved ability to complete a 30 minute workout with strain at pelvic floor and symptoms.   Baseline:  Goal status: INITIAL  3.  Pt will be able to run/workout for 10 minutes without leakage or discomfort to allow patient to resume baseline physical activity without leaking being a limiting factor.  Baseline:  Goal status: INITIAL  4.  Pt to demonstrate at least 4+/5 bil hip strength for improved pelvic stability and functional squats without leakage.  Baseline:  Goal status: INITIAL PLAN:  PT FREQUENCY: 1-2x/week  PT DURATION: 12 weeks  PLANNED INTERVENTIONS: 97110-Therapeutic exercises, 97530- Therapeutic activity, 97112- Neuromuscular re-education, 97535- Self Care, [id number]- Manual therapy, Patient/Family education, Taping, Joint mobilization, Spinal mobilization, Scar mobilization, Cryotherapy, and Moist heat  PLAN FOR NEXT SESSION: continued pelvic floor training in  seated position and introduce hip and core strengthening   Celena JAYSON Domino, PT 01/25/2024, 1:08 PM

## 2024-01-25 NOTE — Patient Instructions (Signed)

## 2024-03-02 ENCOUNTER — Ambulatory Visit: Attending: Nurse Practitioner | Admitting: Physical Therapy

## 2024-03-02 DIAGNOSIS — R293 Abnormal posture: Secondary | ICD-10-CM | POA: Diagnosis not present

## 2024-03-02 DIAGNOSIS — R279 Unspecified lack of coordination: Secondary | ICD-10-CM | POA: Insufficient documentation

## 2024-03-02 DIAGNOSIS — M6281 Muscle weakness (generalized): Secondary | ICD-10-CM | POA: Insufficient documentation

## 2024-03-02 NOTE — Therapy (Signed)
 OUTPATIENT PHYSICAL THERAPY FEMALE PELVIC DISCHARGE SUMMARY   Patient Name: Penny Gibson MRN: [id number] DOB:01/26/1946, 78 y.o., female Today's Date: 03/02/2024  END OF SESSION:  PT End of Session - 03/02/24 0833     Visit Number 5    Number of Visits 10    Authorization Type [employer]    PT Start Time 0800    PT Stop Time [phone]    PT Time Calculation (min) 35 min    Activity Tolerance Patient tolerated treatment well    Behavior During Therapy WFL for tasks assessed/performed              Past Medical History:  Diagnosis Date   Anemia    Arthritis    Asthma    Constipation    Diabetes mellitus without complication (HCC)    Dyspnea    Hyperlipidemia    Hypertension    Insomnia    Joint pain    Knee pain    Lactose intolerance    Lower extremity edema    Multiple food allergies    Osteoarthritis    Sinus problem    Past Surgical History:  Procedure Laterality Date   BREAST BIOPSY Left 2010   BREAST LUMPECTOMY Left 2010   BREAST LUMPECTOMY WITH RADIOACTIVE SEED LOCALIZATION Right 09/19/2020   Procedure: RIGHT BREAST LUMPECTOMY WITH RADIOACTIVE SEED LOCALIZATION;  Surgeon: Curvin Deward MOULD, MD;  Location: [facility];  Service: General;  Laterality: Right;   BREAST SURGERY Right 09/19/2020   EYE SURGERY Bilateral    cataract surgery   REPLACEMENT TOTAL KNEE  03/10/2020   TOTAL KNEE ARTHROPLASTY Left 03/10/2020   Procedure: LEFT TOTAL KNEE ARTHROPLASTY;  Surgeon: Jerri Kay HERO, MD;  Location: [facility];  Service: Orthopedics;  Laterality: Left;   TUBAL LIGATION     Patient Active Problem List   Diagnosis Date Noted   Obesity, morbid (HCC) 09/30/2023   Mixed stress and urge urinary incontinence 09/30/2023   Tinnitus aurium, 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   S/P lumpectomy, right breast 08/04/2022   Drug-induced constipation 01/20/2022   Chronic kidney disease, stage 3a (HCC) 12/25/2021   Primary osteoarthritis of right knee  10/27/2021   Pain due to onychomycosis of toenails of both feet 04/20/2021   Hallux limitus 04/20/2021   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 03/26/2021   History of breast cancer in female 08/27/2020   Type 2 diabetes mellitus with diabetic chronic kidney disease (HCC) 07/18/2020   Vitamin D  deficiency 07/18/2020   Hyperlipidemia associated with type 2 diabetes mellitus (HCC) 07/18/2020   Status post total left knee replacement 03/10/2020   Cardiomegaly 06/27/2018   Iron  deficiency anemia 07/19/2017   Primary osteoarthritis of left knee 12/31/2016   Asthma 05/28/2016   Other seasonal allergic rhinitis 07/26/2014   Atypical squamous cells of undetermined significance (ASCUS) on Papanicolaou smear of cervix 01/31/2014   Essential hypertension 09/19/2013   Generalized osteoarthritis of multiple sites     PCP: [name], Roselie Rockford, NP   REFERRING PROVIDER: Katheen Roselie Rockford, NP   REFERRING DIAG: N39.46 (ICD-10-CM) - Mixed stress and urge urinary incontinence  THERAPY DIAG:  Unspecified lack of coordination  Muscle weakness (generalized)  Abnormal posture  Rationale for Evaluation and Treatment: Rehabilitation  ONSET DATE: last summer  SUBJECTIVE:  SUBJECTIVE STATEMENT: No pelvic pain to report. No urinary leakage during the past week. No urge incontinence either, which patient is pleased with. She has been consistent with HEP.   From eval: Patient reports to PFPT with mixed incontinence. She leaks urine when she is out and about and has to rush to the bathroom. She has urgency when she wakes up. She leaks a small amount of urine each time she leaks.    Fluid intake: drinks very little water  - 2 bottles/day; 1 cup of coffee per day; lemonade 1x/wk; orange juice sometimes;  cherry tart juice for her Gout   PAIN:  Are you having pain? No NPRS scale: 0/10  PRECAUTIONS: None  RED FLAGS: None   WEIGHT BEARING RESTRICTIONS: No  FALLS:  Has patient fallen in last 6 months? No  OCCUPATION: retired and stays home; cleans up around the house   ACTIVITY LEVEL : cleans around the house, paint   PLOF: Independent  PATIENT GOALS: decrease urinary leakage in general   PERTINENT HISTORY:  breast surgery (right) 2022, TKA left 2021, tubal ligation  Sexual abuse: No  BOWEL MOVEMENT: Pain with bowel movement: No Type of bowel movement:Type (Bristol Stool Scale) 1-7, Frequency every other day, Strain no, and Splinting no Fully empty rectum: Yes:   Leakage: No Pads: No Fiber supplement/laxative Yes - Miralax  and iron  pills   URINATION: Pain with urination: No Fully empty bladder: Yes:   Stream: Strong Urgency: Yes  Frequency: within normal limits - at night she will go 4-5 times, every hour  Leakage: Urge to void, Walking to the bathroom, Coughing, and Sneezing Pads: Yes: underwear with pads - changes 1x/day   INTERCOURSE: not currently sexually active    PREGNANCY: Vaginal deliveries 2 Tearing No Episiotomy No C-section deliveries 0 Currently pregnant No  PROLAPSE: None  OBJECTIVE:  Note: Objective measures were completed at Evaluation unless otherwise noted.  PATIENT SURVEYS:  PFIQ-7: 45 PFIQ-7: 0  COGNITION: Overall cognitive status: Within functional limits for tasks assessed     SENSATION: Light touch: Appears intact  LUMBAR SPECIAL TESTS:  Single leg stance test: Positive  FUNCTIONAL TESTS:  Squat: dynamic knee valgus present with lumbopelvic stiffness present during transfer  GAIT: Assistive device utilized: None Comments: moderate trendelenburg gait pattern with ambulation  POSTURE: rounded shoulders, forward head, anterior pelvic tilt, and flexed trunk   LUMBARAROM/PROM:  A/PROM A/PROM  eval  Flexion 25% limited   Extension 25% limited  Right lateral flexion 25% limited  Left lateral flexion 25% limited  Right rotation 25% limited  Left rotation 25% limited   (Blank rows = not tested)  LOWER EXTREMITY ROM: within functional limits   LOWER EXTREMITY MMT: 4/5 bilateral knees and hips grossly   PALPATION:   General: no significant tenderness to palpation of bilateral adductors or hip flexors   Pelvic Alignment: within normal limits   Abdominal: upper chest breathing and abdominal bracing present in seated position and in hooklying. Decreased rib mobility in hooklying                 External Perineal Exam: dryness present with sufficient clitoral hood mobility                              Internal Pelvic Floor: Patient demonstrates generalized weakness in bilateral superficial and deep pelvic floor muscles. She was able to perform a pelvic floor contraction with cueing from PT, but she demonstrates decreased  range of motion through the pelvic floor due to her weakness. There is a lack of coordination present between the diaphragm and pelvic floor, so we worked on cueing her exhales with a pelvic floor contraction. Following 5 contractions, patient was able to get this coordination independently. No pain following today's examination.   Patient confirms identification and approves PT to assess internal pelvic floor and treatment Yes No emotional/communication barriers or cognitive limitation. Patient is motivated to learn. Patient understands and agrees with treatment goals and plan. PT explains patient will be examined in standing, sitting, and lying down to see how their muscles and joints work. When they are ready, they will be asked to remove their underwear so PT can examine their perineum. The patient is also given the option of providing their own chaperone as one is not provided in our facility. The patient also has the right and is explained the right to defer or refuse any part of the evaluation  or treatment including the internal exam. With the patient's consent, PT will use one gloved finger to gently assess the muscles of the pelvic floor, seeing how well it contracts and relaxes and if there is muscle symmetry. After, the patient will get dressed and PT and patient will discuss exam findings and plan of care. PT and patient discuss plan of care, schedule, attendance policy and HEP activities.  PELVIC MMT:   MMT eval  Vaginal 3/5, 10 quick flicks, 3 second hold  Internal Anal Sphincter   External Anal Sphincter   Puborectalis   Diastasis Recti   (Blank rows = not tested)  TONE: Within normal limits   PROLAPSE: None present in hooklying   TODAY'S TREATMENT:                                                                                                                              DATE:   01/13/24: Neuro re-ed: seated pelvic floor lengthening with inhalation and shortening with exhalation 3x10  seated quick flick pelvic floor contractions + diaphragmatic breathing 3x10  Therapeutic exercise: Sit to stand + adductor ball squeeze + diaphragmatic breathing 2x10  Bridge + adductor ball squeeze + diaphragmatic breathing 2x10  Sidelying clamshell + reverse clamshell + diaphragmatic breathing 2x10 each  Lower trunk rotations + diaphragmatic breathing 2x20 Self care:  Relative anatomy and education surrounding the pelvic floor musculature and diaphragm; Urge drill for urge urinary incontinence that affects her when she wakes in the mornings, knack technique for stress urinary incontinence   01/19/24: Neuro re-ed: seated pelvic floor lengthening with inhalation and shortening with exhalation 3x10  seated quick flick pelvic floor contractions + diaphragmatic breathing 3x10  Sit to stand + adductor ball squeeze + diaphragmatic breathing 2x10  Bridge + adductor ball squeeze + diaphragmatic breathing 2x10  Sidelying clamshell + reverse clamshell + diaphragmatic breathing 2x10 each   Therapeutic exercise: Lower trunk rotations + diaphragmatic breathing 2x20 Seated adductor ball squeeze + internal rotation (  RTB) + diaphragmatic breathing 2x10 each side  Seated hamstring stretch + diaphragmatic breathing 2x10 NuStep level 1, 5 minutes - PT present to discuss current status   Self care:  Relative anatomy and education surrounding the pelvic floor musculature and diaphragm; Urge drill for urge urinary incontinence that affects her when she wakes in the mornings, knack technique for stress urinary incontinence   01/25/24: Patient had to leave session early today. Neuro re-ed: seated pelvic floor lengthening with inhalation and shortening with exhalation x10  seated quick flick pelvic floor contractions + diaphragmatic breathing x10  Sit to stand + adductor ball squeeze + diaphragmatic breathing 2x10  Bridge + adductor ball squeeze + diaphragmatic breathing 2x10  Sidelying clamshell + reverse clamshell + diaphragmatic breathing 2x10 each  Therapeutic exercise: Lower trunk rotations + diaphragmatic breathing 2x20 Seated adductor ball squeeze + internal rotation (RTB) + diaphragmatic breathing 2x10 each side  Seated hamstring stretch + diaphragmatic breathing 2x10 Self care:  Relative anatomy and education surrounding the pelvic floor musculature and diaphragm; Urge drill for urge urinary incontinence that affects her when she wakes in the mornings, knack technique for stress urinary incontinence  Review urge drill for when patient gets home and urgency is high trying to get into the house   03/02/24: Discharge day!  Review of HEP and how to maintain progress after pelvic PT has ended  Fiber table provided to educate patient on ways to naturally increase fiber intake with diet  Discussion of water  intake for maintaining current stool consistency  Review of toileting techniques and urge drill/knack technique   PATIENT EDUCATION:  Education details: Relative anatomy and  education surrounding the pelvic floor musculature and diaphragm; Urge drill for urge urinary incontinence that affects her when she wakes in the mornings  Person educated: Patient Education method: Explanation, Demonstration, Tactile cues, Verbal cues, and Handouts Education comprehension: verbalized understanding, returned demonstration, verbal cues required, tactile cues required, and needs further education  HOME EXERCISE PROGRAM: Access Code: [email or web address] URL: [email or web address] Date: 01/19/2024 Prepared by: Celena Domino  Exercises - Seated Pelvic Floor Contraction  - 1 x daily - 7 x weekly - 2 sets - 10 reps - Seated Quick Flick Pelvic Floor Contractions  - 1 x daily - 7 x weekly - 2 sets - 10 reps - Sit to Stand with Mercer Between Knees  - 1 x daily - 7 x weekly - 2 sets - 10 reps - Supine Bridge with Mini Swiss Ball Between Knees  - 1 x daily - 7 x weekly - 2 sets - 10 reps - Seated Hip Internal Rotation with Ball and Resistance  - 1 x daily - 7 x weekly - 2 sets - 10 reps - Supine Lower Trunk Rotation  - 1 x daily - 7 x weekly - 2 sets - 10 reps - Seated Hamstring Stretch  - 1 x daily - 7 x weekly - 2 sets - hold  ASSESSMENT:  CLINICAL IMPRESSION: Patient is a 78 y.o. female  who was seen today for physical therapy discharge for mixed stress and urge urinary incontinence. She has attended 5 sessions of PFPT at this time and has met all goals. She no longer leaks when trying to get to the bathroom in the mornings, and she feels confident with managing urinary leakage during coughing/laughing/sneezing with the knack technique. Pt is appropriate for discharge at this time.   OBJECTIVE IMPAIRMENTS: decreased coordination, decreased endurance, decreased mobility, decreased ROM, and decreased strength.   ACTIVITY  LIMITATIONS: continence  PARTICIPATION LIMITATIONS: ADLs due to fear of leakage   PERSONAL FACTORS: Age, Past/current experiences, and Time since onset of  injury/illness/exacerbation are also affecting patient's functional outcome.   REHAB POTENTIAL: Good  CLINICAL DECISION MAKING: Stable/uncomplicated  EVALUATION COMPLEXITY: Low   GOALS: Goals reviewed with patient? Yes  SHORT TERM GOALS: Target date: 12/28/2023  Pt will be independent with HEP.  Baseline: Goal status: GOAL MET  2.  Pt will be independent with the knack, urge suppression technique, and double voiding in order to improve bladder habits and decrease urinary incontinence.   Baseline:  Goal status: GOAL MET  3.  Pt will be able to functional actions such as sneeze/cough/laugh without leakage to decrease pad use and prevent dependency with adult diapers. Baseline:  Goal status: GOAL MET  LONG TERM GOALS: Target date: 05/31/2024  Pt will be independent with advanced HEP.  Baseline:  Goal status: GOAL MET  2.  Pt to demonstrate improved coordination of pelvic floor and breathing mechanics with 10# squat with appropriate synergistic patterns to decrease pain and leakage at least 75% of the time for improved ability to complete a 30 minute workout with strain at pelvic floor and symptoms.   Baseline:  Goal status: GOAL MET  3.  Pt will be able to run/workout for 10 minutes without leakage or discomfort to allow patient to resume baseline physical activity without leaking being a limiting factor.  Baseline:  Goal status: GOAL MET  4.  Pt to demonstrate at least 4+/5 bil hip strength for improved pelvic stability and functional squats without leakage.  Baseline:  Goal status: GOAL MET PHYSICAL THERAPY DISCHARGE SUMMARY  Visits from Start of Care: 5  Current functional level related to goals / functional outcomes: See above   Remaining deficits: See above   Education / Equipment: See above   Patient agrees to discharge. Patient goals were met. Patient is being discharged due to meeting the stated rehab goals.  Celena JAYSON Domino, PT 03/02/2024, 8:33 AM

## 2024-03-02 NOTE — Patient Instructions (Signed)
A high fiber diet with plenty of fluids (up to 8 glasses of water daily) is suggested to relieve these symptoms.  Metamucil, 1 tablespoon once or twice daily can be used to keep bowels regular if needed.  Fiber Table -- Grams of Fiber in Food  For additional information on fiber content in foods, go to www.caloriecounts.com  Food Products Serving Size Grams of Fiber/serving  Breads    Whole Wheat 1 slice 2.11  White 1 slice 0.5  Rye 1 slice 1.72  Cereals    Oat Bran 1 oz. 4.06  Wheat Bran 1 oz. 10.0  All Bran  cup 6.0  Optimum 1 cup 10.0  Whole Wheat Total 1 cup 3.0  Fiber One   cup 13.0  Shredded Wheat 1oz. 2.64  Corn Flakes 1 oz. 0.45  Cheerio's 1 1/3 cup 2.0  Oatmeal 1 oz. 2.5  Rice    Brown  cup 5.27  White  cup 1.42  Spaghetti 2 oz. 2.56  Vegetables (cooked)    Broccoli  cup 2.58  Brussels sprouts  cup 2.0  Cauliflower  cup 2.6  Carrots  cup 3.2  Corn  cup 3.03  Eggplant  cup 0.96  Green peas  cup 3.36  Lettuce (raw)  cup 0.24  Baked potato w/skin  cup 2.97  Spinach  cup 2.07  Squash  cup 2.87  Tomato (raw)  cup 1.17  Zucchini  cup 1.26  Beans    Green (canned)  cup 1.89  Kidney  cup 5.48  Lima  cup 4.25  Pinto  cup 5.93  Fresh fruits    Apple (with peel) 1 medium 2.76  Apricots 1 cup 3.13  Banana  1 medium 2.19  Black/Boysenberries 1 cup 7.2  Grapefruit 1 medium 3.61  Grapes 1 cup 1.12  Nectarine 1 medium 2.2  Orange 1 medium 3.14  Pear (with peel) 1 medium 4.32  Prunes 3 3.5  Raspberries 1 cup 7.5  Strawberries 1 cup 3.87  Watermelon 1 slice 1.93        

## 2024-03-08 ENCOUNTER — Ambulatory Visit: Admitting: Physical Therapy

## 2024-03-15 ENCOUNTER — Encounter: Admitting: Physical Therapy

## 2024-04-23 ENCOUNTER — Encounter: Payer: Self-pay | Admitting: Radiology

## 2024-05-02 ENCOUNTER — Other Ambulatory Visit: Payer: Self-pay | Admitting: Nurse Practitioner

## 2024-05-02 DIAGNOSIS — I10 Essential (primary) hypertension: Secondary | ICD-10-CM

## 2024-06-25 DIAGNOSIS — Z1231 Encounter for screening mammogram for malignant neoplasm of breast: Secondary | ICD-10-CM

## 2024-07-12 ENCOUNTER — Ambulatory Visit

## 2024-07-24 ENCOUNTER — Ambulatory Visit

## 2024-07-30 ENCOUNTER — Ambulatory Visit: Admitting: Nurse Practitioner

## 2024-08-03 ENCOUNTER — Ambulatory Visit: Payer: 59

## 2024-08-06 ENCOUNTER — Ambulatory Visit

## 2024-09-12 ENCOUNTER — Ambulatory Visit: Admitting: Nurse Practitioner
